# Patient Record
Sex: Female | Born: 1950 | Race: White | Hispanic: No | Marital: Married | Smoking: Never smoker
Health system: Southern US, Community
[De-identification: ages and names within clinical notes are randomized; demographics above are authoritative.]

## PROBLEM LIST (undated history)

## (undated) DIAGNOSIS — G43909 Migraine, unspecified, not intractable, without status migrainosus: Secondary | ICD-10-CM

## (undated) DIAGNOSIS — F32A Depression, unspecified: Secondary | ICD-10-CM

## (undated) DIAGNOSIS — G4700 Insomnia, unspecified: Secondary | ICD-10-CM

## (undated) DIAGNOSIS — E785 Hyperlipidemia, unspecified: Secondary | ICD-10-CM

## (undated) DIAGNOSIS — F329 Major depressive disorder, single episode, unspecified: Secondary | ICD-10-CM

## (undated) DIAGNOSIS — K219 Gastro-esophageal reflux disease without esophagitis: Secondary | ICD-10-CM

## (undated) DIAGNOSIS — M8580 Other specified disorders of bone density and structure, unspecified site: Secondary | ICD-10-CM

## (undated) DIAGNOSIS — K5900 Constipation, unspecified: Secondary | ICD-10-CM

## (undated) DIAGNOSIS — M1990 Unspecified osteoarthritis, unspecified site: Secondary | ICD-10-CM

## (undated) HISTORY — DX: Depression, unspecified: F32.A

## (undated) HISTORY — DX: Gastro-esophageal reflux disease without esophagitis: K21.9

## (undated) HISTORY — PX: ABDOMINAL HYSTERECTOMY: SHX81

## (undated) HISTORY — DX: Other specified disorders of bone density and structure, unspecified site: M85.80

## (undated) HISTORY — DX: Insomnia, unspecified: G47.00

## (undated) HISTORY — DX: Unspecified osteoarthritis, unspecified site: M19.90

## (undated) HISTORY — DX: Constipation, unspecified: K59.00

## (undated) HISTORY — PX: LYMPH NODE BIOPSY: SHX201

## (undated) HISTORY — PX: CATARACT EXTRACTION, BILATERAL: SHX1313

## (undated) HISTORY — PX: APPENDECTOMY: SHX54

## (undated) HISTORY — DX: Migraine, unspecified, not intractable, without status migrainosus: G43.909

## (undated) HISTORY — PX: CHOLECYSTECTOMY: SHX55

## (undated) HISTORY — DX: Hyperlipidemia, unspecified: E78.5

## (undated) HISTORY — PX: BREAST BIOPSY: SHX20

## (undated) HISTORY — DX: Major depressive disorder, single episode, unspecified: F32.9

---

## 1997-08-04 ENCOUNTER — Ambulatory Visit (HOSPITAL_COMMUNITY): Admission: RE | Admit: 1997-08-04 | Discharge: 1997-08-04 | Payer: Self-pay | Admitting: Family Medicine

## 1998-02-13 HISTORY — PX: BREAST EXCISIONAL BIOPSY: SUR124

## 1998-06-30 ENCOUNTER — Ambulatory Visit (HOSPITAL_BASED_OUTPATIENT_CLINIC_OR_DEPARTMENT_OTHER): Admission: RE | Admit: 1998-06-30 | Discharge: 1998-06-30 | Payer: Self-pay | Admitting: General Surgery

## 2001-09-28 ENCOUNTER — Emergency Department (HOSPITAL_COMMUNITY): Admission: EM | Admit: 2001-09-28 | Discharge: 2001-09-28 | Payer: Self-pay | Admitting: Emergency Medicine

## 2004-08-01 ENCOUNTER — Ambulatory Visit (HOSPITAL_COMMUNITY): Admission: RE | Admit: 2004-08-01 | Discharge: 2004-08-01 | Payer: Self-pay | Admitting: *Deleted

## 2006-03-18 ENCOUNTER — Inpatient Hospital Stay (HOSPITAL_COMMUNITY): Admission: EM | Admit: 2006-03-18 | Discharge: 2006-03-25 | Payer: Self-pay | Admitting: Emergency Medicine

## 2006-03-22 ENCOUNTER — Ambulatory Visit: Payer: Self-pay | Admitting: Gastroenterology

## 2006-03-23 ENCOUNTER — Encounter (INDEPENDENT_AMBULATORY_CARE_PROVIDER_SITE_OTHER): Payer: Self-pay | Admitting: *Deleted

## 2006-04-09 ENCOUNTER — Ambulatory Visit: Payer: Self-pay | Admitting: Gastroenterology

## 2006-04-09 LAB — CONVERTED CEMR LAB
ALT: 20 units/L (ref 0–40)
AST: 18 units/L (ref 0–37)
Albumin: 3.6 g/dL (ref 3.5–5.2)
Alkaline Phosphatase: 75 units/L (ref 39–117)
Amylase: 44 units/L (ref 27–131)
Basophils Absolute: 0 10*3/uL (ref 0.0–0.1)
Basophils Relative: 0.8 % (ref 0.0–1.0)
Bilirubin, Direct: 0.1 mg/dL (ref 0.0–0.3)
Eosinophils Absolute: 0.1 10*3/uL (ref 0.0–0.6)
Eosinophils Relative: 1.8 % (ref 0.0–5.0)
HCT: 31.4 % — ABNORMAL LOW (ref 36.0–46.0)
Hemoglobin: 11 g/dL — ABNORMAL LOW (ref 12.0–15.0)
Lipase: 20 units/L (ref 11.0–59.0)
Lymphocytes Relative: 27.7 % (ref 12.0–46.0)
MCHC: 34.9 g/dL (ref 30.0–36.0)
MCV: 89.5 fL (ref 78.0–100.0)
Monocytes Absolute: 0.4 10*3/uL (ref 0.2–0.7)
Monocytes Relative: 6.2 % (ref 3.0–11.0)
Neutro Abs: 3.7 10*3/uL (ref 1.4–7.7)
Neutrophils Relative %: 63.5 % (ref 43.0–77.0)
Platelets: 322 10*3/uL (ref 150–400)
RBC: 3.51 M/uL — ABNORMAL LOW (ref 3.87–5.11)
RDW: 11.4 % — ABNORMAL LOW (ref 11.5–14.6)
Total Bilirubin: 0.5 mg/dL (ref 0.3–1.2)
Total Protein: 7.4 g/dL (ref 6.0–8.3)
WBC: 5.8 10*3/uL (ref 4.5–10.5)

## 2006-04-18 ENCOUNTER — Ambulatory Visit: Payer: Self-pay | Admitting: Gastroenterology

## 2006-04-18 LAB — CONVERTED CEMR LAB
ALT: 17 units/L (ref 0–40)
AST: 14 units/L (ref 0–37)
Albumin: 3.8 g/dL (ref 3.5–5.2)
Alkaline Phosphatase: 69 units/L (ref 39–117)
Ammonia: 15 umol/L (ref 11–35)
Basophils Absolute: 0.1 10*3/uL (ref 0.0–0.1)
Basophils Relative: 1 % (ref 0.0–1.0)
Bilirubin, Direct: 0.1 mg/dL (ref 0.0–0.3)
Eosinophils Absolute: 0.1 10*3/uL (ref 0.0–0.6)
Eosinophils Relative: 1.8 % (ref 0.0–5.0)
HCT: 36.6 % (ref 36.0–46.0)
Hemoglobin: 12.2 g/dL (ref 12.0–15.0)
Lipase: 18 units/L (ref 11.0–59.0)
Lymphocytes Relative: 32.6 % (ref 12.0–46.0)
MCHC: 33.4 g/dL (ref 30.0–36.0)
MCV: 89.3 fL (ref 78.0–100.0)
Monocytes Absolute: 0.4 10*3/uL (ref 0.2–0.7)
Monocytes Relative: 6.3 % (ref 3.0–11.0)
Neutro Abs: 3.6 10*3/uL (ref 1.4–7.7)
Neutrophils Relative %: 58.3 % (ref 43.0–77.0)
Platelets: 279 10*3/uL (ref 150–400)
RBC: 4.1 M/uL (ref 3.87–5.11)
RDW: 11.6 % (ref 11.5–14.6)
Total Bilirubin: 0.8 mg/dL (ref 0.3–1.2)
Total Protein: 7.7 g/dL (ref 6.0–8.3)
WBC: 6.3 10*3/uL (ref 4.5–10.5)

## 2006-04-26 ENCOUNTER — Ambulatory Visit: Payer: Self-pay | Admitting: Gastroenterology

## 2006-05-02 ENCOUNTER — Ambulatory Visit (HOSPITAL_COMMUNITY): Admission: RE | Admit: 2006-05-02 | Discharge: 2006-05-02 | Payer: Self-pay | Admitting: Gastroenterology

## 2006-06-04 ENCOUNTER — Encounter: Admission: RE | Admit: 2006-06-04 | Discharge: 2006-06-04 | Payer: Self-pay | Admitting: Obstetrics and Gynecology

## 2007-05-22 ENCOUNTER — Encounter: Admission: RE | Admit: 2007-05-22 | Discharge: 2007-05-22 | Payer: Self-pay | Admitting: Obstetrics and Gynecology

## 2008-03-23 ENCOUNTER — Encounter: Admission: RE | Admit: 2008-03-23 | Discharge: 2008-05-26 | Payer: Self-pay | Admitting: Family Medicine

## 2008-05-25 ENCOUNTER — Encounter: Admission: RE | Admit: 2008-05-25 | Discharge: 2008-05-25 | Payer: Self-pay | Admitting: Obstetrics and Gynecology

## 2009-04-27 ENCOUNTER — Encounter: Admission: RE | Admit: 2009-04-27 | Discharge: 2009-04-27 | Payer: Self-pay | Admitting: Obstetrics and Gynecology

## 2010-07-01 ENCOUNTER — Other Ambulatory Visit: Payer: Self-pay | Admitting: Family Medicine

## 2010-07-01 DIAGNOSIS — Z1231 Encounter for screening mammogram for malignant neoplasm of breast: Secondary | ICD-10-CM

## 2010-07-01 NOTE — Assessment & Plan Note (Signed)
[facility]                         GASTROENTEROLOGY OFFICE NOTE   NAJE, RICE                        MRN:          [id number]  DATE:04/09/2006                            DOB:          19-Apr-1950    PROBLEM:  Abdominal pain.   REASON:  Mrs. Holte has returned following laparoscopic cholecystectomy.  At surgery adhesions were seen to the gallbladder.  Pathology  demonstrated chronic cholecystitis.  Intraoperative cholangiogram was  unremarkable.  Mrs. Gilchrest is now complaining of anterior chest pain that  radiates to her back.  It is moderately severe at times.  She also is  having excessive eructations.  She is having limited rectal bleeding.  She has known hemorrhoids seen by recent colonoscopy.  Pain prior to the  cholecystectomy was in the right upper quadrant.   Liver tests were initially normal.  On February 9,  AST was 501, ALT  282.  On February 10, AST was 206 and ALT was 247.  Alkaline phos and  bilirubin were normal.   EXAMINATION:  Pulse 80, blood pressure 114/62, weight 142.   IMPRESSION:  1. Abdominal and chest pain reminiscent of biliary pain.  This could      be due to esophagitis.  2. Abnormal liver tests.  Presumably this is due to irritation of the      gallbladder fossa.  There is no evidence for retained bile duct      stone.  3. Limited rectal bleeding - secondary to hemorrhoids.   RECOMMENDATIONS:  1. Begin Protonix 40 mg daily and __________ HC.  2. Follow up LFTs.     Barbette Hair. Arlyce Dice, MD,FACG  Electronically Signed    RDK/MedQ  DD: 04/09/2006  DT: 04/09/2006  Job #: [id number]   cc:   Angelia Mould. Derrell Lolling, M.D.  Ernestina Penna, M.D.

## 2010-07-01 NOTE — Assessment & Plan Note (Signed)
[name]                         GASTROENTEROLOGY OFFICE NOTE   Selena Barnes, Selena Barnes                        MRN:          [id number]  DATE:04/18/2006                            DOB:          04/06/1950    PROBLEM:  Abdominal pain.   Selena Barnes has returned still complaining of abdominal pain. The pain is  in the upper mid epigastrium and radiates through to the back between  the shoulder blades. It is chronic though tends to wax and wane. She  does not awaken with pain. It is unaffected by eating. She does note  excessive belching. Repeat LFTs on April 09, 2006 were entirely  normal. Hemoglobin was 11.0. Amylase and lipase were also unremarkable.  She remains on Protonix. She takes Celebrex intermittently but  infrequently. She is without fever, nausea or pyrosis.   PHYSICAL EXAMINATION:  VITAL SIGNS:  Pulse 78, blood pressure 118/56,  weight 138.  HEENT:  EOMI. PERRLA. Sclerae are anicteric.  Conjunctivae are pink.  NECK:  Supple without thyromegaly, adenopathy or carotid bruits.  CHEST:  Clear to auscultation and percussion without adventitious  sounds.  CARDIAC:  Regular rhythm; normal S1 S2.  There are no murmurs, gallops  or rubs.  ABDOMEN:  She has mild tenderness in the subxiphoid area. There are no  abdominal masses or organomegaly.  EXTREMITIES:  Full range of motion.  No cyanosis, clubbing or edema.  RECTAL:  There are no masses.  Stool is Hemoccult negative.   IMPRESSION:  Persistent upper abdominal pain with a pattern reminiscent  of biliary tract pain. Biliary dyskinesia is a possibility. Retained  bile duct stone is unlikely in the face of a normal intraoperative  cholangiogram. She does not appear sick enough to have a biliary leak.  Finally, esophageal pain can sometimes present in a similar manner.   RECOMMENDATION:  1. Repeat LFTs, CBC and amylase.  2. Trial of Levbid 0.375 mg twice a day.  3. Hepatobiliary scan.     Barbette Hair. Arlyce Dice, MD,FACG  Electronically Signed    RDK/MedQ  DD: 04/18/2006  DT: 04/18/2006  Job #: [id number]   cc:   Angelia Mould. Derrell Lolling, M.D.  Ernestina Penna, M.D.

## 2010-07-01 NOTE — Consult Note (Signed)
Selena Barnes, Selena Barnes                 ACCOUNT NO.:  [id number]   MEDICAL RECORD NO.:  [id number]          PATIENT TYPE:  INP   LOCATION:  1618                         FACILITY:  [facility]   PHYSICIAN:  Selena Barnes, M.D.DATE OF BIRTH:  05/06/1950   DATE OF CONSULTATION:  03/23/2006  DATE OF DISCHARGE:                                 CONSULTATION   REASON FOR CONSULTATION:  Evaluate abdominal pain and possible biliary  tract source.   HISTORY OF PRESENT ILLNESS:  This is a 60 year old white female who  reports intermittent bright red rectal bleeding since February 09, 2007.  She has had problems like this off and on but has never had any rectal  surgery. She also has a second complaint which is epigastric right upper  quadrant and back pain with intermittent nausea and vomiting and  intermittent pain since March 15, 2006.  She was admitted to this  hospital on March 18, 2006 complaining of rectal bleeding and the  abdominal pain. She has been found to have a urinary tract infection and  is on Cipro for that.   LABORATORY DATA:  Her lab work has been fairly unremarkable with normal  hemoglobin, normal white blood cell count, normal coagulation status,  slightly elevated lipase but which promptly normalized, and normal liver  function tests. She has had normal cardiac enzymes.   She has had an extensive work-up under the direction of Dr. Melvia Barnes. She has had a CT scan of the abdomen which shows no acute  findings in the abdomen, and some mild soft tissue prominence in the  region of the ileocecal valve.  She has had a gallbladder ultrasound  which shows a small cyst in the left hepatic lobe. The gallbladder looks  normal, no stones, no wall thickening, no fluid and normal common bile  duct. Spleen and kidneys were normal.  She has had an MRCP which shows  no acute findings, no evidence of gallbladder, biliary or pancreatic  disease. She has had an upper endoscopy which was  unremarkable. She has  had a colonoscopy which shows hemorrhoids, there was no problem at the  ileocecal valve. She had a hepatobiliary scan this morning which shows  that the biliary tree empties and the gallbladder fills but there was a  zero percent ejection fraction after a fatty meal.  The radiologist felt  that this was consistent with biliary dyskinesia and Dr. Arlyce Barnes asked me  to see the patient, thinking that she needs a laparoscopic  cholecystectomy.   The patient currently is in no distress but states that she still has  some upper abdominal discomfort.   PAST MEDICAL HISTORY:  1. Migraine headaches.  2. Hemorrhoids.  3. Surgical menopause.  4. Status post total abdominal hysterectomy in 1976 secondary to      pelvic infection. She states she had an appendectomy at the same      time by Dr. Javier Barnes.   CURRENT MEDICATIONS:  1. Femtrace 1.8 mg daily.  2. Cymbalta 60 mg daily.  3. Topamax 100 mg at h.s.  4. Maxalt-MLT 10  mg q.2 hours x2 p.r.n. severe migraines.  5. Flexeril p.r.n.  6. Ambien 15 mg at h.s.   ALLERGIES:  No known drug allergies.   SOCIAL HISTORY:  She is married. Non smoker. Occasional alcohol.   FAMILY HISTORY:  Coronary artery disease in the father and maternal  uncle. No history of hypertension. Diabetes in a grandson and her  maternal family. No history of cancer that she knows of.   REVIEW OF SYSTEMS:  A 15 system review of systems is performed and  noncontributory except as described above.   PHYSICAL EXAMINATION:  GENERAL: A pleasant white female in no distress.  VITAL SIGNS: Temperature 97.5, blood pressure 128/64, pulse 67,  respiratory rate 20, oxygen saturation 98% on room air.  HEENT: Eyes - sclerae clear. Extraocular movements are intact. Ears,  nose, mouth, throat, lips, tongue and oropharynx are without gross  lesions.  NECK: Supple, nontender, no adenopathy, no mass.  LUNGS: Clear to auscultation, no chest wall tenderness.  HEART:   Regular rate and rhythm, no murmur. Radial and femoral pulses  are palpable.  ABDOMEN: Soft. Subjectively mild tenderness in the upper abdomen, really  no guarding, no mass, no distention. Liver and spleen not enlarged.  No hernia.  GENITOURINARY: No inguinal adenopathy or hernia.  EXTREMITIES: Moves all four extremities well without pain or deformity.  NEUROLOGIC: No gross motor or sensory deficits.   DATA REVIEWED:  I reviewed all of her lab work, all of her endoscopy  reports and all of her x-ray findings.   ASSESSMENT:  1. Upper abdominal pain. Her pain syndrome is consistent with biliary      colic and her hepatobiliary scan strongly suggests biliary      dyskinesia.  2. Extensive gastroenterology work-up which excludes most other major      problems. I suspect that her symptoms are due to biliary dyskinesia      with about a 75 to 80% confidence level.  She was told this.   PLAN:  I offered her the option of proceeding with a laparoscopic  cholecystectomy both as an empiric procedure and hopefully this would be  therapeutic as well.   I have discussed the indication and details of surgery with her. Risks  and complications have been outlined including but not limited to,  bleeding, infection, conversion to open laparotomy, injury to adjacent  organs such as  the intestine or bile duct with major reconstructive surgery, cardiac,  pulmonary and thromboembolic problems. She seems to understand these  issues well. At this time all of her questions were answered.  She is in  full agreement with this plan.  We will proceed with surgery at our next  convenience.      Selena Barnes, M.D.  Electronically Signed     HMI/MEDQ  D:  03/23/2006  T:  03/23/2006  Job:  [id number]   cc:   Barbette Hair. Selena Dice, MD,FACG  [address]  [place]  [place] [address]   Della Goo, M.D.  Fax: [phone]

## 2010-07-01 NOTE — Op Note (Signed)
NAMEJULIAUNA, Selena Barnes                 ACCOUNT NO.:  [id number]   MEDICAL RECORD NO.:  [id number]          PATIENT TYPE:  INP   LOCATION:  1618                         FACILITY:  [facility]   PHYSICIAN:  Angelia Mould. Derrell Lolling, M.D.DATE OF BIRTH:  Dec 22, 1950   DATE OF PROCEDURE:  03/23/2006  DATE OF DISCHARGE:                               OPERATIVE REPORT   PREOPERATIVE DIAGNOSIS:  Chronic cholecystitis and biliary dyskinesia.   POSTOPERATIVE DIAGNOSIS:  Chronic cholecystitis and biliary dyskinesia.   OPERATION PERFORMED:  Laparoscopic cholecystectomy with intraoperative  cholangiogram.   SURGEON:  Dr. Claud Kelp.   FIRST ASSISTANT:  Dr. Leonie Man.   OPERATIVE INDICATIONS:  This is a 60 year old white female who was  admitted to this hospital on March 18, 2006 with rectal bleeding  secondary to hemorrhoids and upper abdominal pain.  The hemorrhoidal  bleeding had been going off and on for about 5 weeks but the abdominal  pain had been going on just for a day or two since about March 15, 2006.  She has had some nausea and vomiting.  This was somewhat  postprandial but not clearly.  She was admitted. She has had an  extensive GI workup including a CT scan, MRCP, upper endoscopy,  colonoscopy with banding of internal hemorrhoids, gallbladder  ultrasound, and hepatobiliary scan.  The only significant findings are  of the hemorrhoids and on the hepatobiliary scan she has a 0% ejection  fraction after a fatty meal suggesting biliary dyskinesia.  Her lab work  is unremarkable.  I was asked to see her this morning.  I advised her  that I thought that she probably had chronic cholecystitis and that she  probably would respond to cholecystectomy, although it was not clear.  Given that there was very little else to do diagnostically, we decided  to go ahead with the cholecystectomy today.   OPERATIVE FINDINGS:  The gallbladder was chronically inflamed in  appearance, there were  numerous adhesions to it.  Photographs were  taken.  There were lots of adhesions in the lower abdomen, presumably  from her previous hysterectomy and appendectomy.  There was no hernia.  The liver, stomach, duodenum, large intestine and small intestine were  grossly normal to inspection.   OPERATIVE TECHNIQUE:  Following the induction of general endotracheal  anesthesia, the patient's abdomen was prepped and draped in a sterile  fashion.  Intravenous antibiotics were given.  The patient was  identified as to correct patient and procedure.  0.5% Marcaine with  epinephrine was used as a local infiltration anesthetic.  A vertically  oriented incision was made inside the lower rim of the umbilicus.  The  fascia was incised in the midline, the abdominal cavity entered under  direct vision.  A 10 mm Hassan trocar was inserted and secured with a  pursestring suture of #0 Vicryl.  Pneumoperitoneum was created.  The  video camera was inserted.  I had to pass the video camera off to the  left upper quadrant and then pass it back to the right to get around  some adhesions. I could  then see the liver and the gallbladder and the  right upper quadrant quite nicely.  We placed a 10-mm trocar in  subxiphoid region and two 5 mm trocars in the right upper quadrant.  We  lifted up the fundus of the gallbladder.  We took down lots of adhesions  which were chronic.  We identified the infundibulum of the gallbladder  and retracted it laterally.  We dissected out the cystic duct and the  cystic artery.  The cystic duct was secured with a metal clip close to  the gallbladder.  A cholangiogram catheter was inserted into the cystic  duct.  A cholangiogram was obtained using the C-arm.  The cholangiogram  showed normal intra and extrahepatic bile ducts, no filling defects, no  biliary dilatation, and no obstruction with good flow of contrast into  the duodenum.  The cholangiogram catheter was removed, the cystic  duct  was secured with multiple metal clips and divided.  The cystic artery  was then isolated, secured with multiple metal clips and divided.  The  gallbladder was dissected from its bed using electrocautery and removed  through the umbilical port.  The operative field was irrigated and  inspected.  There was no bleeding and no bile leak whatsoever, and the  irrigation fluid was completely clear.  The trocars were removed under  direct vision.  There was no bleeding from the trocar sites. The  pneumoperitoneum was released.  The fascia at the umbilicus was closed  with #0 Vicryl sutures.  The skin incisions were closed with  subcuticular sutures of 4-0 Monocryl and Steri-Strips.  Clean bandages  were placed and the patient taken to the recovery room in stable  condition.  Estimated blood loss was about 10 mL.  Complications none.  Sponge, needle and instrument counts were correct.      Angelia Mould. Derrell Lolling, M.D.  Electronically Signed     HMI/MEDQ  D:  03/23/2006  T:  03/23/2006  Job:  [id number]   cc:   Della Goo, M.D.  Fax: [phone]   [facility] D. Arlyce Dice, MD,FACG  [address]  [place]  [place] [address]

## 2010-07-01 NOTE — Assessment & Plan Note (Signed)
[facility]                         GASTROENTEROLOGY OFFICE NOTE   Selena Barnes, Selena Barnes                        MRN:          [id number]  DATE:04/26/2006                            DOB:          08/10/1950    PROBLEM:  Abdominal pain.   Selena Barnes has returned for scheduled follow-up.  She continues to  complain of pain in her subxiphoid area that radiates to her back.  It  is not clearly related to meals.  She does note that it worsens with  prolonged sitting or lying down.   Repeat labs including CBC, LFTs, amylase and lipase were normal.  She  has been taking Levbid twice a day without improvement.  Hepatobiliary  scan was requested but was not done.   PHYSICAL EXAMINATION:  VITAL SIGNS:  Pulse 84, blood pressure 116/78,  weight 137.  ABDOMEN:  She has minimal tenderness over her sternum and subxiphoid  area.  There is no guarding or rebound nor any abdominal masses or  organomegaly.   IMPRESSION:  Persistent upper abdominal pain.  Biliary dyskinesia is a  consideration, though there is no supporting evidence thus far.  I think  it is unlikely that this is esophageal pain.  Musculoskeletal pain is a  possibility.   RECOMMENDATIONS:  Hepatobiliary scan.  If this shows a delay in emptying  into the duodenum, I would consider ERCP with sphincterotomy.  If this  is negative, I will refer her for biliary manometry.     Barbette Hair. Arlyce Dice, MD,FACG  Electronically Signed    RDK/MedQ  DD: 04/26/2006  DT: 04/28/2006  Job #: [id number]   cc:   Angelia Mould. Derrell Lolling, M.D.  Ernestina Penna, M.D.

## 2010-07-01 NOTE — H&P (Signed)
Selena Barnes, Selena Barnes                 ACCOUNT NO.:  [id number]   MEDICAL RECORD NO.:  [id number]          PATIENT TYPE:  INP   LOCATION:  0104                         FACILITY:  [facility]   PHYSICIAN:  Della Goo, M.D. DATE OF BIRTH:  10-04-1950   DATE OF ADMISSION:  03/17/2006  DATE OF DISCHARGE:                              HISTORY & PHYSICAL   CONTINUATION:   ASSESSMENT:  This is a 60 year old female presenting with abdominal pain  and bright red blood per rectum.   ADMITTING DIAGNOSES INCLUDE:  1. Lower gastrointestinal bleeding.  2. Epigastric abdominal pain.  3. Gastroesophageal reflux disease.  4. Hypokalemia.  5. UTI  6. External hemorrhoids.  7. History of migraine headaches.  8. Surgical menopause.   PLAN:  The patient will be admitted to the med-surgery area as mentioned  previously, she will be on a clear liquid diet and IV fluids.  She will  be medicated with IV Protonix q.12 h. which may help alleviate the  heartburn and gastroesophageal reflux disease symptoms.  Gastroenterology will be consulted.  Anusol-HC suppositories have been  ordered secondary to the external hemorrhoids.  An abdominal CT has been  ordered to evaluate the left lobe of the liver enhancement on CT scan  performed on January31, 2008 along with the evidence of fatty liver that  was also found.  The patient will continue on her regular medications  otherwise.  IV cipro has been statrrted for the UTI.      Della Goo, M.D.  Electronically Signed     HJ/MEDQ  D:  03/18/2006  T:  03/18/2006  Job:  [id number]

## 2010-07-01 NOTE — Discharge Summary (Signed)
Selena Barnes, Selena Barnes                 ACCOUNT NO.:  [id number]   MEDICAL RECORD NO.:  [id number]          PATIENT TYPE:  INP   LOCATION:  1618                         FACILITY:  [facility]   PHYSICIAN:  Michaelyn Barter, M.D. DATE OF BIRTH:  [birth year]   DATE OF ADMISSION:  03/17/2006  DATE OF DISCHARGE:  03/25/2006                               DISCHARGE SUMMARY   PRIMARY CARE PHYSICIAN:  [facility].   FINAL DIAGNOSES:  1. Rectal bleed secondary to internal hemorrhoids.  2. Persistent abdominal pain.  3. Biliary dyskinesia.  4. Chronic cholecystitis.  5. Hypotension.  6. Urinary tract infection.  7. Transaminitis.  8. Hypoalbuminemia.   PROCEDURES.:  1. Abdominal x-ray completed March 18, 2006.  2. CT scan of the abdomen and pelvis done on March 18, 2006.  3. Ultrasound of the abdomen done on March 18, 2006.  4. MRCP completed March 21, 2006.  5. Nuclear medicine hepatobiliary scan completed on March 23, 2006.  6. EGD completed on March 20, 2006.  7. Colonoscopy completed March 20, 2006.  8. Band ligation of hemorrhoids.  9. Laparoscopic cholecystectomy with intraoperative cholangiogram done      by Dr. Claud Kelp on March 23, 2006.   HISTORY OF PRESENT ILLNESS:  Ms. Clippard is a 60 year old female who  arrived to the emergency department complaining of bright red blood  coming from her rectum.  She indicated that this had been occurring for  approximately 5 weeks. She stated that she had episodes on the evening  prior to coming to the hospital.   For Past Medical History, please see that dictated by Dr. Della Goo.   HOSPITAL COURSE:  #1.  HEMATOCHEZIA:  This was believed to be secondary to internal  hemorrhoids.  Gastroenterology was consulted, and on February 5, Dr.  Melvia Heaps performed an EGD. His assessment was that it was a normal  examination. On that same date, Dr. Arlyce Dice performed a colonoscopy. He  indicated that  the patient had internal hemorrhoids present. He  recommended Anusol suppository at that time.  Since that time, the  patient has undergone banding of her hemorrhoids.   #2.  PERSISTENT ABDOMINAL PAIN SECONDARY TO CHRONIC CHOLECYSTITIS AND  BILIARY DYSKINESIA:  An abdominal x-ray was completed on March 18, 2006. It revealed no evidence for bowel perforation or obstruction.  The  patient underwent a CAT scan of her abdomen and pelvis on March 18, 2006.  These revealed no acute finding in the abdomen. Mild soft tissue  prominence was noted in the region of the ileocecal valve, was found to  be nonspecific. An ultrasound of her abdomen was also completed February  3 which revealed a 1 cm left hepatic cyst and a normal gallbladder. The  patient's abdominal symptoms persisted and she finally underwent a MRCP  on March 21, 2006.  The MRCP revealed no acute abdominal findings.  There was noted to be no evidence of gallbladder or biliary disease.  The pancreas and pancreatic duct were normal. On February 8, the patient  had an hepatobiliary scan completed.  It revealed cystic duct to be  patent, and the gallbladder was visualized. The gallbladder did not  empty appreciably with oral half-and-half. General surgery was  consulted. Dr. Derrell Lolling took the patient to the operating room on February  8 and performed a laparoscopic cholecystectomy with intraoperative  cholangiogram. This was secondary to a diagnosis of chronic  cholecystitis and biliary dyskinesia. The patient's operation appears to  have gone without any complications. She is had some residual abdominal  discomfort but states that the pain medication helps to alleviate her  symptoms.   #3.  HYPOTENSION:  The patient did have some mild hypotension following  her surgery.  However, IV fluid hydration has helped to alleviate this.  She was otherwise asymptomatic secondary to this.   #4.  URINARY TRACT INFECTION:  The patient did  have a urinalysis  completed which confirmed the presence of a UTI. Ciprofloxacin was  subsequently started. The patient remained afebrile with a normal white  count   #5.  TRANSAMINITIS: The patient's liver enzymes were noted to be  significantly elevated, particularly during the latter portion of her  hospitalization. The patient's SGOT was noted to be 18 on February 3. By  February 9,  it went up to 501.  On February 3, the patient's SGPT was  noted to be 16.  By February 9, it increased to 282. The plan was  initially to discharge the patient from the hospital on February 9;  however, given this elevation in the patient's liver enzymes, she was  held for one additional day to ensure that there was a decline in the  patient's liver enzymes, and on February 10, there was noted to be a  decline in the patient's liver enzymes.  Her SGOT, which was 501 the  previous day, declined to 206.  The SGPT, which was 282, declined to  147.   #6.  HYPOALBUMINEMIA.  The patient's albumin level was noted to be 2.9  by March 24, 2006.  There may have been a component of mild protein  calorie malnutrition. This was monitored during the course of the  hospitalization.  Hopefully once the patient is over her current  situation and begins to recuperate from her surgery, her p.o. intake  will resume, and this may help to improve her albumin. On the day of  discharge, the patient indicated that she wanted to go home.  She had no  new complaints.   Vital Signs: Her temperature was 97.9, heart rate 81, respirations 20,  blood pressure 125/64, O2 saturation 97% on room air.  The patient's  labs on that date of discharge: Her white blood cell count was 6.3,  hemoglobin 11.2, hematocrit 32.1, platelets 215. Her sodium was 143,  potassium 4.0, chloride 110, CO2 30, glucose 103, BUN 5, creatinine  0.71, alkaline phosphatase 104, SGOT 206, SGPT 247, total protein 5.5, albumin 2.9, calcium 9.0. Amylase 301,  lipase 23. The decision was made  to discharge the patient from the hospital. She was sent home on:  1. Ciprofloxacin 500 mg p.o. q.12 h.  2. Cymbalta 60 mg p.o. daily.  3. Protonix 40 mg p.o. daily.  4. Topamax 1 tablet at bedtime.  5. Zofran 4 mg every 8 hours.   She was told to take all her medications as directed. She is instructed  to call Dr. Arlyce Dice for followup appointment and to see surgery's  instructions.  Likewise, she was given instructions to call general  surgery for followup appointment.  The patient did undergo banding of  her internal hemorrhoids on March 22, 2006.      Michaelyn Barter, M.D.  Electronically Signed     OR/MEDQ  D:  03/25/2006  T:  03/25/2006  Job:  [id number]
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
## 2010-07-08 ENCOUNTER — Ambulatory Visit: Payer: Self-pay

## 2010-07-13 ENCOUNTER — Ambulatory Visit
Admission: RE | Admit: 2010-07-13 | Discharge: 2010-07-13 | Disposition: A | Discharge status: Home / Self Care | Payer: 59 | Source: Ambulatory Visit | Attending: Family Medicine | Admitting: Family Medicine

## 2010-07-13 DIAGNOSIS — Z1231 Encounter for screening mammogram for malignant neoplasm of breast: Secondary | ICD-10-CM

## 2011-03-30 DIAGNOSIS — R072 Precordial pain: Secondary | ICD-10-CM | POA: Diagnosis not present

## 2011-03-30 DIAGNOSIS — R079 Chest pain, unspecified: Secondary | ICD-10-CM | POA: Diagnosis not present

## 2011-03-30 DIAGNOSIS — K219 Gastro-esophageal reflux disease without esophagitis: Secondary | ICD-10-CM | POA: Diagnosis not present

## 2011-05-02 DIAGNOSIS — E559 Vitamin D deficiency, unspecified: Secondary | ICD-10-CM | POA: Diagnosis not present

## 2011-05-02 DIAGNOSIS — K219 Gastro-esophageal reflux disease without esophagitis: Secondary | ICD-10-CM | POA: Diagnosis not present

## 2011-05-02 DIAGNOSIS — G4700 Insomnia, unspecified: Secondary | ICD-10-CM | POA: Diagnosis not present

## 2011-05-02 DIAGNOSIS — E785 Hyperlipidemia, unspecified: Secondary | ICD-10-CM | POA: Diagnosis not present

## 2011-05-02 DIAGNOSIS — I10 Essential (primary) hypertension: Secondary | ICD-10-CM | POA: Diagnosis not present

## 2011-06-09 ENCOUNTER — Other Ambulatory Visit: Payer: Self-pay | Admitting: Family Medicine

## 2011-06-09 DIAGNOSIS — Z1231 Encounter for screening mammogram for malignant neoplasm of breast: Secondary | ICD-10-CM

## 2011-07-17 ENCOUNTER — Ambulatory Visit
Admission: RE | Admit: 2011-07-17 | Discharge: 2011-07-17 | Disposition: A | Discharge status: Home / Self Care | Payer: 59 | Source: Ambulatory Visit | Attending: Family Medicine | Admitting: Family Medicine

## 2011-07-17 DIAGNOSIS — Z1231 Encounter for screening mammogram for malignant neoplasm of breast: Secondary | ICD-10-CM

## 2011-08-03 DIAGNOSIS — R5381 Other malaise: Secondary | ICD-10-CM | POA: Diagnosis not present

## 2011-08-03 DIAGNOSIS — K219 Gastro-esophageal reflux disease without esophagitis: Secondary | ICD-10-CM | POA: Diagnosis not present

## 2011-08-03 DIAGNOSIS — E785 Hyperlipidemia, unspecified: Secondary | ICD-10-CM | POA: Diagnosis not present

## 2011-11-09 DIAGNOSIS — G4700 Insomnia, unspecified: Secondary | ICD-10-CM | POA: Diagnosis not present

## 2011-11-09 DIAGNOSIS — I10 Essential (primary) hypertension: Secondary | ICD-10-CM | POA: Diagnosis not present

## 2011-11-09 DIAGNOSIS — Z23 Encounter for immunization: Secondary | ICD-10-CM | POA: Diagnosis not present

## 2011-11-09 DIAGNOSIS — E785 Hyperlipidemia, unspecified: Secondary | ICD-10-CM | POA: Diagnosis not present

## 2011-11-09 DIAGNOSIS — G43919 Migraine, unspecified, intractable, without status migrainosus: Secondary | ICD-10-CM | POA: Diagnosis not present

## 2012-02-20 DIAGNOSIS — D3130 Benign neoplasm of unspecified choroid: Secondary | ICD-10-CM | POA: Diagnosis not present

## 2012-03-12 DIAGNOSIS — G4700 Insomnia, unspecified: Secondary | ICD-10-CM | POA: Diagnosis not present

## 2012-03-12 DIAGNOSIS — K219 Gastro-esophageal reflux disease without esophagitis: Secondary | ICD-10-CM | POA: Diagnosis not present

## 2012-03-12 DIAGNOSIS — I10 Essential (primary) hypertension: Secondary | ICD-10-CM | POA: Diagnosis not present

## 2012-03-12 DIAGNOSIS — G43919 Migraine, unspecified, intractable, without status migrainosus: Secondary | ICD-10-CM | POA: Diagnosis not present

## 2012-03-12 DIAGNOSIS — I739 Peripheral vascular disease, unspecified: Secondary | ICD-10-CM | POA: Diagnosis not present

## 2012-03-12 DIAGNOSIS — E785 Hyperlipidemia, unspecified: Secondary | ICD-10-CM | POA: Diagnosis not present

## 2012-06-11 ENCOUNTER — Other Ambulatory Visit: Payer: Self-pay

## 2012-06-11 DIAGNOSIS — Z1231 Encounter for screening mammogram for malignant neoplasm of breast: Secondary | ICD-10-CM

## 2012-07-17 ENCOUNTER — Ambulatory Visit
Admission: RE | Admit: 2012-07-17 | Discharge: 2012-07-17 | Disposition: A | Discharge status: Home / Self Care | Payer: Medicare Other | Source: Ambulatory Visit

## 2012-07-17 DIAGNOSIS — Z1231 Encounter for screening mammogram for malignant neoplasm of breast: Secondary | ICD-10-CM

## 2012-11-28 ENCOUNTER — Other Ambulatory Visit: Payer: Self-pay | Admitting: *Deleted

## 2012-11-28 MED ORDER — ZOLPIDEM TARTRATE 10 MG PO TABS: 10.0000 mg | ORAL_TABLET | Freq: Every evening | ORAL | Status: DC | PRN

## 2012-11-28 NOTE — Telephone Encounter (Signed)
Please refill ambien rx

## 2012-11-28 NOTE — Telephone Encounter (Signed)
Has appt 12/18/12, last filled 07/18/12. If approved, route to pool B so it can be called into [facility]

## 2012-11-29 NOTE — Telephone Encounter (Signed)
Called into [facility]

## 2012-12-20 ENCOUNTER — Encounter (INDEPENDENT_AMBULATORY_CARE_PROVIDER_SITE_OTHER): Payer: Self-pay

## 2012-12-20 ENCOUNTER — Ambulatory Visit (INDEPENDENT_AMBULATORY_CARE_PROVIDER_SITE_OTHER): Payer: Medicare Other | Admitting: Nurse Practitioner

## 2012-12-20 ENCOUNTER — Encounter: Payer: Self-pay | Admitting: Nurse Practitioner

## 2012-12-20 VITALS — BP 123/62 | HR 62 | Temp 97.1°F | Ht 64.0 in | Wt 152.0 lb

## 2012-12-20 DIAGNOSIS — G43909 Migraine, unspecified, not intractable, without status migrainosus: Secondary | ICD-10-CM

## 2012-12-20 DIAGNOSIS — F329 Major depressive disorder, single episode, unspecified: Secondary | ICD-10-CM | POA: Diagnosis not present

## 2012-12-20 DIAGNOSIS — Z124 Encounter for screening for malignant neoplasm of cervix: Secondary | ICD-10-CM

## 2012-12-20 DIAGNOSIS — Z23 Encounter for immunization: Secondary | ICD-10-CM | POA: Diagnosis not present

## 2012-12-20 DIAGNOSIS — Z0000 Encounter for general adult medical examination without abnormal findings: Secondary | ICD-10-CM | POA: Diagnosis not present

## 2012-12-20 DIAGNOSIS — E785 Hyperlipidemia, unspecified: Secondary | ICD-10-CM | POA: Diagnosis not present

## 2012-12-20 DIAGNOSIS — Z01419 Encounter for gynecological examination (general) (routine) without abnormal findings: Secondary | ICD-10-CM

## 2012-12-20 DIAGNOSIS — F32A Depression, unspecified: Secondary | ICD-10-CM

## 2012-12-20 DIAGNOSIS — G4700 Insomnia, unspecified: Secondary | ICD-10-CM | POA: Diagnosis not present

## 2012-12-20 DIAGNOSIS — K219 Gastro-esophageal reflux disease without esophagitis: Secondary | ICD-10-CM

## 2012-12-20 LAB — POCT URINALYSIS DIPSTICK
Bilirubin, UA: NEGATIVE
Glucose, UA: NEGATIVE
Ketones, UA: NEGATIVE
Leukocytes, UA: NEGATIVE
Nitrite, UA: NEGATIVE
Spec Grav, UA: 1.01
Urobilinogen, UA: NEGATIVE
pH, UA: 8

## 2012-12-20 LAB — POCT CBC
Granulocyte percent: 51.5 %G (ref 37–80)
HCT, POC: 41.7 % (ref 37.7–47.9)
Hemoglobin: 13.9 g/dL (ref 12.2–16.2)
Lymph, poc: 3.3 (ref 0.6–3.4)
MCH, POC: 28.9 pg (ref 27–31.2)
MCHC: 33.4 g/dL (ref 31.8–35.4)
MCV: 86.6 fL (ref 80–97)
MPV: 8.1 fL (ref 0–99.8)
POC Granulocyte: 4.2 (ref 2–6.9)
POC LYMPH PERCENT: 40.8 %L (ref 10–50)
Platelet Count, POC: 292 10*3/uL (ref 142–424)
RBC: 4.8 M/uL (ref 4.04–5.48)
RDW, POC: 12.2 %
WBC: 8.2 10*3/uL (ref 4.6–10.2)

## 2012-12-20 MED ORDER — ESTRADIOL 1 MG PO TABS: 1.0000 mg | ORAL_TABLET | Freq: Every day | ORAL | Status: DC

## 2012-12-20 MED ORDER — PROMETHAZINE HCL 25 MG PO TABS: 25.0000 mg | ORAL_TABLET | Freq: Four times a day (QID) | ORAL | Status: DC | PRN

## 2012-12-20 MED ORDER — DULOXETINE HCL 60 MG PO CPEP: 60.0000 mg | ORAL_CAPSULE | Freq: Every day | ORAL | Status: DC

## 2012-12-20 MED ORDER — RIZATRIPTAN BENZOATE 10 MG PO TBDP: 10.0000 mg | ORAL_TABLET | ORAL | Status: DC | PRN

## 2012-12-20 MED ORDER — METAXALONE 800 MG PO TABS: 800.0000 mg | ORAL_TABLET | Freq: Three times a day (TID) | ORAL | Status: DC

## 2012-12-20 MED ORDER — ESOMEPRAZOLE MAGNESIUM 40 MG PO CPDR: 40.0000 mg | DELAYED_RELEASE_CAPSULE | Freq: Every day | ORAL | Status: DC

## 2012-12-20 MED ORDER — ZOLPIDEM TARTRATE 10 MG PO TABS: 10.0000 mg | ORAL_TABLET | Freq: Every evening | ORAL | Status: DC | PRN

## 2012-12-20 MED ORDER — PRAVASTATIN SODIUM 40 MG PO TABS: 40.0000 mg | ORAL_TABLET | Freq: Every day | ORAL | Status: DC

## 2012-12-20 MED ORDER — DICLOFENAC POTASSIUM 50 MG PO TABS: 50.0000 mg | ORAL_TABLET | Freq: Three times a day (TID) | ORAL | Status: DC

## 2012-12-20 MED ORDER — TOPIRAMATE 100 MG PO TABS: 100.0000 mg | ORAL_TABLET | Freq: Every day | ORAL | Status: DC

## 2012-12-20 NOTE — Addendum Note (Signed)
Addended by: Prescott Gum on: 12/20/2012 01:56 PM   Modules accepted: Orders

## 2012-12-20 NOTE — Addendum Note (Signed)
Addended by: Prescott Gum on: 12/20/2012 12:58 PM   Modules accepted: Orders

## 2012-12-20 NOTE — Patient Instructions (Signed)
Fat and Cholesterol Control Diet  Fat and cholesterol levels in your blood and organs are influenced by your diet. High levels of fat and cholesterol may lead to diseases of the heart, small and large blood vessels, gallbladder, liver, and pancreas.  CONTROLLING FAT AND CHOLESTEROL WITH DIET  Although exercise and lifestyle factors are important, your diet is key. That is because certain foods are known to raise cholesterol and others to lower it. The goal is to balance foods for their effect on cholesterol and more importantly, to replace saturated and trans fat with other types of fat, such as monounsaturated fat, polyunsaturated fat, and omega-3 fatty acids.  On average, a person should consume no more than 15 to 17 g of saturated fat daily. Saturated and trans fats are considered "bad" fats, and they will raise LDL cholesterol. Saturated fats are primarily found in animal products such as meats, butter, and cream. However, that does not mean you need to give up all your favorite foods. Today, there are good tasting, low-fat, low-cholesterol substitutes for most of the things you like to eat. Choose low-fat or nonfat alternatives. Choose round or loin cuts of red meat. These types of cuts are lowest in fat and cholesterol. Chicken (without the skin), fish, veal, and ground turkey breast are great choices. Eliminate fatty meats, such as hot dogs and salami. Even shellfish have little or no saturated fat. Have a 3 oz (85 g) portion when you eat lean meat, poultry, or fish.  Trans fats are also called "partially hydrogenated oils." They are oils that have been scientifically manipulated so that they are solid at room temperature resulting in a longer shelf life and improved taste and texture of foods in which they are added. Trans fats are found in stick margarine, some tub margarines, cookies, crackers, and baked goods.   When baking and cooking, oils are a great substitute for butter. The monounsaturated oils are  especially beneficial since it is believed they lower LDL and raise HDL. The oils you should avoid entirely are saturated tropical oils, such as coconut and palm.   Remember to eat a lot from food groups that are naturally free of saturated and trans fat, including fish, fruit, vegetables, beans, grains (barley, rice, couscous, bulgur wheat), and pasta (without cream sauces).   IDENTIFYING FOODS THAT LOWER FAT AND CHOLESTEROL   Soluble fiber may lower your cholesterol. This type of fiber is found in fruits such as apples, vegetables such as broccoli, potatoes, and carrots, legumes such as beans, peas, and lentils, and grains such as barley. Foods fortified with plant sterols (phytosterol) may also lower cholesterol. You should eat at least 2 g per day of these foods for a cholesterol lowering effect.   Read package labels to identify low-saturated fats, trans fat free, and low-fat foods at the supermarket. Select cheeses that have only 2 to 3 g saturated fat per ounce. Use a heart-healthy tub margarine that is free of trans fats or partially hydrogenated oil. When buying baked goods (cookies, crackers), avoid partially hydrogenated oils. Breads and muffins should be made from whole grains (whole-wheat or whole oat flour, instead of "flour" or "enriched flour"). Buy non-creamy canned soups with reduced salt and no added fats.   FOOD PREPARATION TECHNIQUES   Never deep-fry. If you must fry, either stir-fry, which uses very little fat, or use non-stick cooking sprays. When possible, broil, bake, or roast meats, and steam vegetables. Instead of putting butter or margarine on vegetables, use lemon   and herbs, applesauce, and cinnamon (for squash and sweet potatoes). Use nonfat yogurt, salsa, and low-fat dressings for salads.   LOW-SATURATED FAT / LOW-FAT FOOD SUBSTITUTES  Meats / Saturated Fat (g)  · Avoid: Steak, marbled (3 oz/85 g) / 11 g  · Choose: Steak, lean (3 oz/85 g) / 4 g  · Avoid: Hamburger (3 oz/85 g) / 7  g  · Choose: Hamburger, lean (3 oz/85 g) / 5 g  · Avoid: Ham (3 oz/85 g) / 6 g  · Choose: Ham, lean cut (3 oz/85 g) / 2.4 g  · Avoid: Chicken, with skin, dark meat (3 oz/85 g) / 4 g  · Choose: Chicken, skin removed, dark meat (3 oz/85 g) / 2 g  · Avoid: Chicken, with skin, light meat (3 oz/85 g) / 2.5 g  · Choose: Chicken, skin removed, light meat (3 oz/85 g) / 1 g  Dairy / Saturated Fat (g)  · Avoid: Whole milk (1 cup) / 5 g  · Choose: Low-fat milk, 2% (1 cup) / 3 g  · Choose: Low-fat milk, 1% (1 cup) / 1.5 g  · Choose: Skim milk (1 cup) / 0.3 g  · Avoid: Hard cheese (1 oz/28 g) / 6 g  · Choose: Skim milk cheese (1 oz/28 g) / 2 to 3 g  · Avoid: Cottage cheese, 4% fat (1 cup) / 6.5 g  · Choose: Low-fat cottage cheese, 1% fat (1 cup) / 1.5 g  · Avoid: Ice cream (1 cup) / 9 g  · Choose: Sherbet (1 cup) / 2.5 g  · Choose: Nonfat frozen yogurt (1 cup) / 0.3 g  · Choose: Frozen fruit bar / trace  · Avoid: Whipped cream (1 tbs) / 3.5 g  · Choose: Nondairy whipped topping (1 tbs) / 1 g  Condiments / Saturated Fat (g)  · Avoid: Mayonnaise (1 tbs) / 2 g  · Choose: Low-fat mayonnaise (1 tbs) / 1 g  · Avoid: Butter (1 tbs) / 7 g  · Choose: Extra light margarine (1 tbs) / 1 g  · Avoid: Coconut oil (1 tbs) / 11.8 g  · Choose: Olive oil (1 tbs) / 1.8 g  · Choose: Corn oil (1 tbs) / 1.7 g  · Choose: Safflower oil (1 tbs) / 1.2 g  · Choose: Sunflower oil (1 tbs) / 1.4 g  · Choose: Soybean oil (1 tbs) / 2.4 g  · Choose: Canola oil (1 tbs) / 1 g  Document Released: 01/30/2005 Document Revised: 05/27/2012 Document Reviewed: 07/21/2010  [employer]® Patient Information ©2014 [employer].

## 2012-12-20 NOTE — Progress Notes (Signed)
Subjective:    Patient ID: Selena Barnes, female    DOB: [birth year], 62 y.o.   MRN: [id number]  HPI Patient in today for annual physical exam and PAP- SHe is doing well- No complaints today- Patient Active Problem List   Diagnosis Date Noted  . Migraines 12/20/2012  . Insomnia 12/20/2012  . Depression 12/20/2012  . GERD (gastroesophageal reflux disease) 12/20/2012  . Hyperlipidemia LDL goal < 100 12/20/2012   Outpatient Encounter Prescriptions as of 12/20/2012  Medication Sig  . aspirin 81 MG tablet Take 81 mg by mouth daily.  . Calcium Carbonate-Vitamin D (CALTRATE 600+D PO) Take by mouth.  . Cholecalciferol (VITAMIN D3) 2000 UNITS capsule Take 2,000 Units by mouth 2 (two) times daily.  . diclofenac (CATAFLAM) 50 MG tablet Take 50 mg by mouth 3 (three) times daily.  . DULoxetine (CYMBALTA) 60 MG capsule Take 60 mg by mouth daily.  Marland Kitchen esomeprazole (NEXIUM) 40 MG capsule Take 40 mg by mouth daily at 12 noon.  Marland Kitchen estradiol (ESTRACE) 1 MG tablet Take 1 mg by mouth daily.  . fish oil-omega-3 fatty acids 1000 MG capsule Take 1 g by mouth daily. Take 4 pills qd  . metaxalone (SKELAXIN) 800 MG tablet Take 800 mg by mouth 3 (three) times daily.  . pravastatin (PRAVACHOL) 40 MG tablet Take 40 mg by mouth daily.  . promethazine (PHENERGAN) 25 MG tablet Take 25 mg by mouth every 6 (six) hours as needed for nausea or vomiting.  . rizatriptan (MAXALT) 10 MG tablet Take 10 mg by mouth as needed for migraine. May repeat in 2 hours if needed  . topiramate (TOPAMAX) 100 MG tablet Take 100 mg by mouth daily.  Marland Kitchen zolpidem (AMBIEN) 10 MG tablet Take 1 tablet (10 mg total) by mouth at bedtime as needed for sleep.       Review of Systems  Constitutional: Negative.   HENT: Negative.   Respiratory: Negative.   Cardiovascular: Negative.   Gastrointestinal: Negative.   Genitourinary: Negative.   Musculoskeletal: Negative.   Neurological: Negative.   Hematological: Negative.   Psychiatric/Behavioral:  Negative.   All other systems reviewed and are negative.       Objective:   Physical Exam  Constitutional: She is oriented to person, place, and time. She appears well-developed and well-nourished.  HENT:  Head: Normocephalic.  Right Ear: Hearing, tympanic membrane, external ear and ear canal normal.  Left Ear: Hearing, tympanic membrane, external ear and ear canal normal.  Nose: Nose normal.  Mouth/Throat: Uvula is midline and oropharynx is clear and moist.  Eyes: Conjunctivae and EOM are normal. Pupils are equal, round, and reactive to light.  Neck: Normal range of motion and full passive range of motion without pain. Neck supple. No JVD present. Carotid bruit is not present. No mass and no thyromegaly present.  Cardiovascular: Normal rate, normal heart sounds and intact distal pulses.   No murmur heard. Pulmonary/Chest: Effort normal and breath sounds normal. Right breast exhibits no inverted nipple, no mass, no nipple discharge, no skin change and no tenderness. Left breast exhibits no inverted nipple, no mass, no nipple discharge, no skin change and no tenderness.  Abdominal: Soft. Bowel sounds are normal. She exhibits no mass. There is no tenderness.  Genitourinary: Vagina normal and uterus normal. Guaiac negative stool. No breast swelling, tenderness, discharge or bleeding.  bimanual exam-No adnexal masses or tenderness. Vaginal cuff intact Large cystocele  Musculoskeletal: Normal range of motion.  Lymphadenopathy:    She has no cervical  adenopathy.  Neurological: She is alert and oriented to person, place, and time.  Skin: Skin is warm and dry.  Psychiatric: She has a normal mood and affect. Her behavior is normal. Judgment and thought content normal.    BP 123/62  Pulse 62  Temp(Src) 97.1 F (36.2 C) (Oral)  Ht 5\' 4"  (1.626 m)  Wt 152 lb (68.947 kg)  BMI 26.08 kg/m2       Assessment & Plan:   1. Annual physical exam   2. Migraines   3. Insomnia   4.  Depression   5. GERD (gastroesophageal reflux disease)   6. Hyperlipidemia LDL goal < 100    Orders Placed This Encounter  Procedures  . POCT urinalysis dipstick   Meds ordered this encounter  Medications  . estradiol (ESTRACE) 1 MG tablet    Sig: Take 1 mg by mouth daily.  . Cholecalciferol (VITAMIN D3) 2000 UNITS capsule    Sig: Take 2,000 Units by mouth 2 (two) times daily.  Marland Kitchen aspirin 81 MG tablet    Sig: Take 81 mg by mouth daily.  . diclofenac (CATAFLAM) 50 MG tablet    Sig: Take 50 mg by mouth 3 (three) times daily.  . fish oil-omega-3 fatty acids 1000 MG capsule    Sig: Take 1 g by mouth daily. Take 4 pills qd  . Calcium Carbonate-Vitamin D (CALTRATE 600+D PO)    Sig: Take by mouth.  . topiramate (TOPAMAX) 100 MG tablet    Sig: Take 100 mg by mouth daily.  Marland Kitchen esomeprazole (NEXIUM) 40 MG capsule    Sig: Take 40 mg by mouth daily at 12 noon.  . DULoxetine (CYMBALTA) 60 MG capsule    Sig: Take 60 mg by mouth daily.  . pravastatin (PRAVACHOL) 40 MG tablet    Sig: Take 40 mg by mouth daily.  . rizatriptan (MAXALT) 10 MG tablet    Sig: Take 10 mg by mouth as needed for migraine. May repeat in 2 hours if needed  . promethazine (PHENERGAN) 25 MG tablet    Sig: Take 25 mg by mouth every 6 (six) hours as needed for nausea or vomiting.  . metaxalone (SKELAXIN) 800 MG tablet    Sig: Take 800 mg by mouth 3 (three) times daily.    Continue all meds Labs pending Diet and exercise encouraged Health maintenance reviewed Follow up in 6 months  Selena Daphine Deutscher, FNP

## 2012-12-21 LAB — THYROID PANEL WITH TSH
Free Thyroxine Index: 2 (ref 1.2–4.9)
T3 Uptake Ratio: 26 % (ref 24–39)
T4, Total: 7.7 ug/dL (ref 4.5–12.0)
TSH: 2.17 u[IU]/mL (ref 0.450–4.500)

## 2012-12-21 LAB — CMP14+EGFR
ALT: 23 IU/L (ref 0–32)
AST: 20 IU/L (ref 0–40)
Albumin/Globulin Ratio: 1.6 (ref 1.1–2.5)
Albumin: 4.6 g/dL (ref 3.6–4.8)
Alkaline Phosphatase: 62 IU/L (ref 39–117)
BUN/Creatinine Ratio: 20 (ref 11–26)
BUN: 15 mg/dL (ref 8–27)
CO2: 27 mmol/L (ref 18–29)
Calcium: 9.6 mg/dL (ref 8.6–10.2)
Chloride: 103 mmol/L (ref 97–108)
Creatinine, Ser: 0.74 mg/dL (ref 0.57–1.00)
GFR calc Af Amer: 101 mL/min/{1.73_m2} (ref >59)
GFR calc non Af Amer: 88 mL/min/{1.73_m2} (ref >59)
Globulin, Total: 2.8 g/dL (ref 1.5–4.5)
Glucose: 90 mg/dL (ref 65–99)
Potassium: 4.7 mmol/L (ref 3.5–5.2)
Sodium: 141 mmol/L (ref 134–144)
Total Bilirubin: 0.3 mg/dL (ref 0.0–1.2)
Total Protein: 7.4 g/dL (ref 6.0–8.5)

## 2012-12-21 LAB — LIPID PANEL
Chol/HDL Ratio: 2.9 ratio units (ref 0.0–4.4)
Cholesterol, Total: 186 mg/dL (ref 100–199)
HDL: 64 mg/dL (ref >39)
LDL Calculated: 100 mg/dL — ABNORMAL HIGH (ref 0–99)
Triglycerides: 111 mg/dL (ref 0–149)
VLDL Cholesterol Cal: 22 mg/dL (ref 5–40)

## 2012-12-25 LAB — PAP IG W/ RFLX HPV ASCU: PAP Smear Comment: 0

## 2013-06-20 ENCOUNTER — Ambulatory Visit (INDEPENDENT_AMBULATORY_CARE_PROVIDER_SITE_OTHER): Payer: Medicare Other | Admitting: Nurse Practitioner

## 2013-06-20 ENCOUNTER — Encounter: Payer: Self-pay | Admitting: Nurse Practitioner

## 2013-06-20 VITALS — BP 119/66 | HR 66 | Temp 98.2°F | Ht 64.0 in | Wt 155.0 lb

## 2013-06-20 DIAGNOSIS — K219 Gastro-esophageal reflux disease without esophagitis: Secondary | ICD-10-CM

## 2013-06-20 DIAGNOSIS — G4700 Insomnia, unspecified: Secondary | ICD-10-CM | POA: Diagnosis not present

## 2013-06-20 DIAGNOSIS — G43909 Migraine, unspecified, not intractable, without status migrainosus: Secondary | ICD-10-CM

## 2013-06-20 DIAGNOSIS — Z23 Encounter for immunization: Secondary | ICD-10-CM | POA: Diagnosis not present

## 2013-06-20 DIAGNOSIS — F3289 Other specified depressive episodes: Secondary | ICD-10-CM

## 2013-06-20 DIAGNOSIS — F329 Major depressive disorder, single episode, unspecified: Secondary | ICD-10-CM

## 2013-06-20 DIAGNOSIS — E785 Hyperlipidemia, unspecified: Secondary | ICD-10-CM | POA: Diagnosis not present

## 2013-06-20 DIAGNOSIS — F32A Depression, unspecified: Secondary | ICD-10-CM

## 2013-06-20 MED ORDER — ZOLPIDEM TARTRATE 10 MG PO TABS: 10.0000 mg | ORAL_TABLET | Freq: Every evening | ORAL | Status: DC | PRN

## 2013-06-20 NOTE — Addendum Note (Signed)
Addended by: Josephine Cables on: 06/20/2013 11:31 AM   Modules accepted: Orders

## 2013-06-20 NOTE — Progress Notes (Signed)
   Subjective:    Patient ID: Selena Barnes, female    DOB: [birth year], 63 y.o.   MRN: [id number]  Hyperlipidemia This is a chronic problem. The current episode started more than 1 year ago. The problem is uncontrolled. Recent lipid tests were reviewed and are high. She has no history of diabetes, hypothyroidism or obesity. Pertinent negatives include no chest pain, focal sensory loss, leg pain, myalgias or shortness of breath. Current antihyperlipidemic treatment includes statins. The current treatment provides moderate improvement of lipids. Compliance problems include adherence to diet and adherence to exercise.  Risk factors for coronary artery disease include dyslipidemia and post-menopausal.  GERD Nexium working well- keeps symptoms under control- has occasional acid reflux depending on what she eats. Depression cymbalta working well. No c/o side effects Insomnia ambien 10 mg qhs- patient has been out of meds for several days and has not been sleeping well. Migraines Has one everyday- currently going to chiropractor for treatment- uses maxalt when she needs it. Takes topamax daiy.   Review of Systems  Respiratory: Negative for shortness of breath.   Cardiovascular: Negative for chest pain.  Musculoskeletal: Negative for myalgias.  Neurological: Positive for headaches.  All other systems reviewed and are negative.      Objective:   Physical Exam  Constitutional: She is oriented to person, place, and time. She appears well-developed and well-nourished.  HENT:  Nose: Nose normal.  Mouth/Throat: Oropharynx is clear and moist.  Eyes: EOM are normal.  Neck: Trachea normal, normal range of motion and full passive range of motion without pain. Neck supple. No JVD present. Carotid bruit is not present. No thyromegaly present.  Cardiovascular: Normal rate, regular rhythm, normal heart sounds and intact distal pulses.  Exam reveals no gallop and no friction rub.   No murmur  heard. Pulmonary/Chest: Effort normal and breath sounds normal.  Abdominal: Soft. Bowel sounds are normal. She exhibits no distension and no mass. There is no tenderness.  Musculoskeletal: Normal range of motion.  Lymphadenopathy:    She has no cervical adenopathy.  Neurological: She is alert and oriented to person, place, and time. She has normal reflexes. No cranial nerve deficit.  Skin: Skin is warm and dry.  Psychiatric: She has a normal mood and affect. Her behavior is normal. Judgment and thought content normal.   BP 119/66  Pulse 66  Temp(Src) 98.2 F (36.8 C) (Oral)  Ht $R'5\' 4"'CL$  (1.626 m)  Wt 155 lb (70.308 kg)  BMI 26.59 kg/m2         Assessment & Plan:   1. Insomnia   2. Hyperlipidemia LDL goal < 100   3. GERD (gastroesophageal reflux disease)   4. Depression   5. Migraines    Orders Placed This Encounter  Procedures  . CMP14+EGFR  . NMR, lipoprofile   Meds ordered this encounter  Medications  . zolpidem (AMBIEN) 10 MG tablet    Sig: Take 1 tablet (10 mg total) by mouth at bedtime as needed for sleep.    Dispense:  90 tablet    Refill:  1    Order Specific Question:  Supervising Provider    Answer:  Chipper Herb [[id number]]    Labs pending Health maintenance reviewed Diet and exercise encouraged Continue all meds Follow up  In 3 months   [place], FNP

## 2013-06-21 LAB — CMP14+EGFR
ALT: 20 IU/L (ref 0–32)
AST: 21 IU/L (ref 0–40)
Albumin/Globulin Ratio: 1.6 (ref 1.1–2.5)
Albumin: 4.3 g/dL (ref 3.6–4.8)
Alkaline Phosphatase: 55 IU/L (ref 39–117)
BUN/Creatinine Ratio: 25 (ref 11–26)
BUN: 18 mg/dL (ref 8–27)
CO2: 28 mmol/L (ref 18–29)
Calcium: 9.8 mg/dL (ref 8.7–10.3)
Chloride: 99 mmol/L (ref 97–108)
Creatinine, Ser: 0.73 mg/dL (ref 0.57–1.00)
GFR calc Af Amer: 102 mL/min/{1.73_m2} (ref >59)
GFR calc non Af Amer: 89 mL/min/{1.73_m2} (ref >59)
Globulin, Total: 2.7 g/dL (ref 1.5–4.5)
Glucose: 97 mg/dL (ref 65–99)
Potassium: 5 mmol/L (ref 3.5–5.2)
Sodium: 140 mmol/L (ref 134–144)
Total Bilirubin: <0.2 mg/dL (ref 0.0–1.2)
Total Protein: 7 g/dL (ref 6.0–8.5)

## 2013-06-21 LAB — NMR, LIPOPROFILE
Cholesterol: 233 mg/dL — ABNORMAL HIGH (ref <200)
HDL Cholesterol by NMR: 58 mg/dL (ref >=40)
HDL Particle Number: 36.5 umol/L (ref >=30.5)
LDL Particle Number: 1927 nmol/L — ABNORMAL HIGH (ref <1000)
LDL Size: 21.2 nm (ref >20.5)
LDLC SERPL CALC-MCNC: 155 mg/dL — ABNORMAL HIGH (ref <100)
LP-IR Score: 26 (ref <=45)
Small LDL Particle Number: 918 nmol/L — ABNORMAL HIGH (ref <=527)
Triglycerides by NMR: 102 mg/dL (ref <150)

## 2013-06-23 ENCOUNTER — Telehealth: Payer: Self-pay | Admitting: *Deleted

## 2013-06-23 NOTE — Telephone Encounter (Signed)
Patient came by office today due to having a area on her left upper arm that is swollen and red from zostavax injection. Patient was notified that it looks like a localized reaction and for her to take some benadryl, mark the area to see if it gets better and if it does not improve that she needs to call us so we can get her appt to come in

## 2013-06-24 ENCOUNTER — Telehealth: Payer: Self-pay | Admitting: Nurse Practitioner

## 2013-06-24 NOTE — Telephone Encounter (Signed)
Patient on pravahcol- not strong enough will sh eagree to try something else?pp

## 2013-06-25 NOTE — Telephone Encounter (Signed)
Patient agrees to try something to try for her cholesterol. Patient would like a 90 day supply with 3 refills to mail to [facility]

## 2013-06-26 MED ORDER — ATORVASTATIN CALCIUM 40 MG PO TABS: 40.0000 mg | ORAL_TABLET | Freq: Every day | ORAL | Status: DC

## 2013-06-26 NOTE — Telephone Encounter (Signed)
lipitor 40mg  sent to mail order

## 2014-01-01 ENCOUNTER — Telehealth: Payer: Self-pay | Admitting: Family Medicine

## 2014-01-01 NOTE — Telephone Encounter (Signed)
Appointment scheduled for 12/10 with Ronnald Collum at 8 am for a follow up and bone spur

## 2014-01-22 ENCOUNTER — Ambulatory Visit (INDEPENDENT_AMBULATORY_CARE_PROVIDER_SITE_OTHER): Payer: Medicare Other | Admitting: *Deleted

## 2014-01-22 ENCOUNTER — Ambulatory Visit (INDEPENDENT_AMBULATORY_CARE_PROVIDER_SITE_OTHER): Payer: Medicare Other | Admitting: Nurse Practitioner

## 2014-01-22 ENCOUNTER — Ambulatory Visit (INDEPENDENT_AMBULATORY_CARE_PROVIDER_SITE_OTHER): Payer: Medicare Other

## 2014-01-22 ENCOUNTER — Encounter: Payer: Self-pay | Admitting: Nurse Practitioner

## 2014-01-22 VITALS — BP 124/68 | HR 68 | Temp 98.2°F | Ht 64.0 in | Wt 148.0 lb

## 2014-01-22 DIAGNOSIS — N951 Menopausal and female climacteric states: Secondary | ICD-10-CM

## 2014-01-22 DIAGNOSIS — Z23 Encounter for immunization: Secondary | ICD-10-CM

## 2014-01-22 DIAGNOSIS — F329 Major depressive disorder, single episode, unspecified: Secondary | ICD-10-CM

## 2014-01-22 DIAGNOSIS — G4700 Insomnia, unspecified: Secondary | ICD-10-CM | POA: Diagnosis not present

## 2014-01-22 DIAGNOSIS — G43919 Migraine, unspecified, intractable, without status migrainosus: Secondary | ICD-10-CM

## 2014-01-22 DIAGNOSIS — E785 Hyperlipidemia, unspecified: Secondary | ICD-10-CM | POA: Diagnosis not present

## 2014-01-22 DIAGNOSIS — M7731 Calcaneal spur, right foot: Secondary | ICD-10-CM | POA: Diagnosis not present

## 2014-01-22 DIAGNOSIS — K219 Gastro-esophageal reflux disease without esophagitis: Secondary | ICD-10-CM

## 2014-01-22 DIAGNOSIS — F32A Depression, unspecified: Secondary | ICD-10-CM

## 2014-01-22 MED ORDER — DICLOFENAC POTASSIUM 50 MG PO TABS: 50.0000 mg | ORAL_TABLET | Freq: Three times a day (TID) | ORAL | Status: DC

## 2014-01-22 MED ORDER — DULOXETINE HCL 60 MG PO CPEP: 60.0000 mg | ORAL_CAPSULE | Freq: Every day | ORAL | Status: DC

## 2014-01-22 MED ORDER — ZOLPIDEM TARTRATE 10 MG PO TABS: 10.0000 mg | ORAL_TABLET | Freq: Every evening | ORAL | Status: DC | PRN

## 2014-01-22 MED ORDER — ROSUVASTATIN CALCIUM 10 MG PO TABS: 10.0000 mg | ORAL_TABLET | Freq: Every day | ORAL | Status: DC

## 2014-01-22 MED ORDER — ESOMEPRAZOLE MAGNESIUM 40 MG PO CPDR: 40.0000 mg | DELAYED_RELEASE_CAPSULE | Freq: Every day | ORAL | Status: DC

## 2014-01-22 MED ORDER — ESTRADIOL 1 MG PO TABS: 1.0000 mg | ORAL_TABLET | Freq: Every day | ORAL | Status: DC

## 2014-01-22 NOTE — Progress Notes (Signed)
Subjective:    Patient ID: Selena Barnes, female    DOB: 1951-01-03, 63 y.o.   MRN: [id number]   Patient here today for follow up of chronic medical problems.    Hyperlipidemia This is a chronic problem. The current episode started more than 1 year ago. The problem is uncontrolled. Recent lipid tests were reviewed and are high. Pertinent negatives include no chest pain, myalgias or shortness of breath. Current antihyperlipidemic treatment includes statins. The current treatment provides moderate improvement of lipids. Compliance problems include adherence to diet and adherence to exercise.  Risk factors for coronary artery disease include dyslipidemia and post-menopausal.  GERD Nexium working well- keeps symptoms under control- has occasional acid reflux depending on what she eats. Depression cymbalta working well. No c/o side effects Insomnia ambien 10 mg qhs- patient has been out of meds for several days and has not been sleeping well. Migraines Has one everyday- currently going to chiropractor for treatment- uses maxalt when she needs it. Takes topamax daiy.  * Patient has a history of bone spur in right heel- started flaring up 2 weeks ago- hurts real bad to walk.  Review of Systems  Respiratory: Negative for shortness of breath.   Cardiovascular: Negative for chest pain.  Musculoskeletal: Negative for myalgias.  Neurological: Positive for headaches.  All other systems reviewed and are negative.      Objective:   Physical Exam  Constitutional: She is oriented to person, place, and time. She appears well-developed and well-nourished.  HENT:  Nose: Nose normal.  Mouth/Throat: Oropharynx is clear and moist.  Eyes: EOM are normal.  Neck: Trachea normal, normal range of motion and full passive range of motion without pain. Neck supple. No JVD present. Carotid bruit is not present. No thyromegaly present.  Cardiovascular: Normal rate, regular rhythm, normal heart sounds and  intact distal pulses.  Exam reveals no gallop and no friction rub.   No murmur heard. Pulmonary/Chest: Effort normal and breath sounds normal.  Abdominal: Soft. Bowel sounds are normal. She exhibits no distension and no mass. There is no tenderness.  Musculoskeletal: Normal range of motion.  Lymphadenopathy:    She has no cervical adenopathy.  Neurological: She is alert and oriented to person, place, and time. She has normal reflexes.  Skin: Skin is warm and dry.  Psychiatric: She has a normal mood and affect. Her behavior is normal. Judgment and thought content normal.   BP 124/68 mmHg  Pulse 68  Temp(Src) 98.2 F (36.8 C) (Oral)  Ht 5' 4"  (1.626 m)  Wt 148 lb (67.132 kg)  BMI 25.39 kg/m2  Chest x ray- questionable nodule left upper lobe- Preliminary reading by Ronnald Collum, FNP  [facility] Left foot- heel spur-Preliminary reading by Ronnald Collum, FNP  [facility] EKG- NSR        Assessment & Plan:   1. Heel spur, right Get cushion for shoe - DG Foot Complete Right; Future - diclofenac (CATAFLAM) 50 MG tablet; Take 1 tablet (50 mg total) by mouth 3 (three) times daily.  Dispense: 270 tablet; Refill: 1  2. Intractable migraine without status migrainosus, unspecified migraine type Avoid caffeine  3. Gastroesophageal reflux disease without esophagitis Avoid spicy and fatty foods - esomeprazole (NEXIUM) 40 MG capsule; Take 1 capsule (40 mg total) by mouth daily at 12 noon.  Dispense: 90 capsule; Refill: 3  4. Insomnia Bedtime ritual - zolpidem (AMBIEN) 10 MG tablet; Take 1 tablet (10 mg total) by mouth at bedtime as needed for sleep.  Dispense: 90 tablet; Refill: 1  5. Depression stres smanagement - DULoxetine (CYMBALTA) 60 MG capsule; Take 1 capsule (60 mg total) by mouth daily.  Dispense: 90 capsule; Refill: 3  6. Hyperlipidemia with target LDL less than 100 Low fat diet and exercise encouraged - DG Chest 2 View; Future - EKG 12-Lead - CMP14+EGFR - NMR, lipoprofile -  rosuvastatin (CRESTOR) 10 MG tablet; Take 1 tablet (10 mg total) by mouth daily.  Dispense: 90 tablet; Refill: 1  7. Menopausal flushing - estradiol (ESTRACE) 1 MG tablet; Take 1 tablet (1 mg total) by mouth daily.  Dispense: 90 tablet; Refill: 3   hemoccult cards given to patient with directions Labs pending Health maintenance reviewed Diet and exercise encouraged Continue all meds Follow up  In 3 months   [place], FNP

## 2014-01-22 NOTE — Patient Instructions (Signed)
Heel Spur A heel spur is a hook of bone that can form on the calcaneus (the heel bone and the largest bone of the foot). Heel spurs are often associated with plantar fasciitis and usually come in people who have had the problem for an extended period of time. The cause of the relationship is unknown. The pain associated with them is thought to be caused by an inflammation (soreness and redness) of the plantar fascia rather than the spur itself. The plantar fascia is a thick fibrous like tissue that runs from the calcaneus (heel bone) to the ball of the foot. This strong, tight tissue helps maintain the arch of your foot. It helps distribute the weight across your foot as you walk or run. Stresses placed on the plantar fascia can be tremendous. When it is inflamed normal activities become painful. Pain is worse in the morning after sleeping. After sleeping the plantar fascia is tight. The first movements stretch the fascia and this causes pain. As the tendon loosens, the pain usually gets better. It often returns with too much standing or walking.  About 70% of patients with plantar fasciitis have a heel spur. About half of people without foot pain also have heel spurs. DIAGNOSIS  The diagnosis of a heel spur is made by X-ray. The X-ray shows a hook of bone protruding from the bottom of the calcaneus at the point where the plantar fascia is attached to the heel bone.  TREATMENT  It is necessary to find out what is causing the stretching of the plantar fascia. If the cause is over-pronation (flat feet), orthotics and proper foot ware may help.  Stretching exercises, losing weight, wearing shoes that have a cushioned heel that absorbs shock, and elevating the heel with the use of a heel cradle, heel cup, or orthotics may all help. Heel cradles and heel cups provide extra comfort and cushion to the heel, and reduce the amount of shock to the sore area. AVOIDING THE PAIN OF PLANTAR FASCIITIS AND HEEL  SPURS  Consult a sports medicine professional before beginning a new exercise program.  Walking programs offer a good workout. There is a lower chance of overuse injuries common to the runners. There is less impact and less jarring of the joints.  Begin all new exercise programs slowly. If problems or pains develop, decrease the amount of time or distance until you are at a comfortable level.  Wear good shoes and replace them regularly.  Stretch your foot and the heel cords at the back of the ankle (Achilles tendons) both before and after exercise.  Run or exercise on even surfaces that are not hard. For example, asphalt is better than pavement.  Do not run barefoot on hard surfaces.  If using a treadmill, vary the incline.  Do not continue to workout if you have foot or joint problems. Seek professional help if they do not improve. HOME CARE INSTRUCTIONS   Avoid activities that cause you pain until you recover.  Use ice or cold packs to the problem or painful areas after working out.  Only take over-the-counter or prescription medicines for pain, discomfort, or fever as directed by your caregiver.  Soft shoe inserts or athletic shoes with air or gel sole cushions may be helpful.  If problems continue or become more severe, consult a sports medicine caregiver. Cortisone is a potent anti-inflammatory medication that may be injected into the painful area. You can discuss this treatment with your caregiver. MAKE SURE YOU:  Understand these instructions.  Will watch your condition.  Will get help right away if you are not doing well or get worse. Document Released: 03/08/2005 Document Revised: 04/24/2011 Document Reviewed: 04/02/2013 ExitCare Patient Information 2015 [employer]. This information is not intended to replace advice given to you by your health care provider. Make sure you discuss any questions you have with your health care provider.  

## 2014-01-23 ENCOUNTER — Other Ambulatory Visit: Payer: Medicare Other

## 2014-01-23 DIAGNOSIS — Z1212 Encounter for screening for malignant neoplasm of rectum: Secondary | ICD-10-CM | POA: Diagnosis not present

## 2014-01-23 LAB — NMR, LIPOPROFILE
Cholesterol: 226 mg/dL — ABNORMAL HIGH (ref 100–199)
HDL Cholesterol by NMR: 63 mg/dL (ref >39)
HDL Particle Number: 34.5 umol/L (ref >=30.5)
LDL Particle Number: 1397 nmol/L — ABNORMAL HIGH (ref <1000)
LDL Size: 21.1 nm (ref >20.5)
LDL-C: 140 mg/dL — ABNORMAL HIGH (ref 0–99)
LP-IR Score: <25 (ref <=45)
Small LDL Particle Number: 95 nmol/L (ref <=527)
Triglycerides by NMR: 113 mg/dL (ref 0–149)

## 2014-01-23 LAB — CMP14+EGFR
ALT: 23 IU/L (ref 0–32)
AST: 23 IU/L (ref 0–40)
Albumin/Globulin Ratio: 1.4 (ref 1.1–2.5)
Albumin: 4.3 g/dL (ref 3.6–4.8)
Alkaline Phosphatase: 54 IU/L (ref 39–117)
BUN/Creatinine Ratio: 20 (ref 11–26)
BUN: 15 mg/dL (ref 8–27)
CO2: 27 mmol/L (ref 18–29)
Calcium: 9.7 mg/dL (ref 8.7–10.3)
Chloride: 103 mmol/L (ref 97–108)
Creatinine, Ser: 0.75 mg/dL (ref 0.57–1.00)
GFR calc Af Amer: 99 mL/min/{1.73_m2} (ref >59)
GFR calc non Af Amer: 86 mL/min/{1.73_m2} (ref >59)
Globulin, Total: 3 g/dL (ref 1.5–4.5)
Glucose: 92 mg/dL (ref 65–99)
Potassium: 5 mmol/L (ref 3.5–5.2)
Sodium: 140 mmol/L (ref 134–144)
Total Bilirubin: 0.5 mg/dL (ref 0.0–1.2)
Total Protein: 7.3 g/dL (ref 6.0–8.5)

## 2014-01-23 NOTE — Progress Notes (Signed)
Lab only 

## 2014-01-25 LAB — FECAL OCCULT BLOOD, IMMUNOCHEMICAL: Fecal Occult Bld: NEGATIVE

## 2014-01-26 ENCOUNTER — Encounter: Payer: Self-pay | Admitting: *Deleted

## 2014-02-03 ENCOUNTER — Ambulatory Visit (INDEPENDENT_AMBULATORY_CARE_PROVIDER_SITE_OTHER): Payer: Medicare Other | Admitting: Nurse Practitioner

## 2014-02-03 ENCOUNTER — Ambulatory Visit (INDEPENDENT_AMBULATORY_CARE_PROVIDER_SITE_OTHER): Payer: Medicare Other

## 2014-02-03 ENCOUNTER — Encounter: Payer: Self-pay | Admitting: Nurse Practitioner

## 2014-02-03 VITALS — BP 142/76 | HR 74 | Temp 97.2°F | Ht 64.0 in | Wt 150.0 lb

## 2014-02-03 DIAGNOSIS — S6992XA Unspecified injury of left wrist, hand and finger(s), initial encounter: Secondary | ICD-10-CM

## 2014-02-03 DIAGNOSIS — S60222A Contusion of left hand, initial encounter: Secondary | ICD-10-CM

## 2014-02-03 DIAGNOSIS — M7731 Calcaneal spur, right foot: Secondary | ICD-10-CM | POA: Diagnosis not present

## 2014-02-03 DIAGNOSIS — S6000XA Contusion of unspecified finger without damage to nail, initial encounter: Secondary | ICD-10-CM | POA: Diagnosis not present

## 2014-02-03 NOTE — Progress Notes (Signed)
   Subjective:    Patient ID: Selena Barnes, female    DOB: 1951-01-26, 63 y.o.   MRN: [id number]  HPI Patient dropped a board on her left foot Saturday- hand is really bruised and hurts to move. Patient also c/o bone spur to right  Heel is still hurting. Is waiting on voltaren rx.    Review of Systems  Constitutional: Negative.   HENT: Negative.   Respiratory: Negative.   Cardiovascular: Negative.   Genitourinary: Negative.   Neurological: Negative.   Psychiatric/Behavioral: Negative.   All other systems reviewed and are negative.      Objective:   Physical Exam  Constitutional: She is oriented to person, place, and time. She appears well-developed and well-nourished.  Cardiovascular: Normal rate and normal heart sounds.   Pulmonary/Chest: Effort normal.  Musculoskeletal:  echymosis of dorsal surface of left hand and left ring finger. Limited ROM of left ring finger due to pain with flexion.  Neurological: She is alert and oriented to person, place, and time.   BP 142/76 mmHg  Pulse 74  Temp(Src) 97.2 F (36.2 C) (Oral)  Ht 5\' 4"  (1.626 m)  Wt 150 lb (68.04 kg)  BMI 25.73 kg/m2   Xray left hand and ring finger- no fracture visible-Preliminary reading by Ronnald Collum, FNP  [facility]      Assessment & Plan:  1. Hand injury, left, initial encounter 2. Contusion of left hand including fingers, initial encounter Ice Motrin OTC every 6 hours - do not take with diclofenac - DG Hand Complete Left; Future   3. Heel spur, right Heel cup for shoes Start diclofenac when get from mail order  Mary-Margaret Hassell [place], FNP

## 2014-02-03 NOTE — Patient Instructions (Signed)
Contusion °A contusion is a deep bruise. Contusions are the result of an injury that caused bleeding under the skin. The contusion may turn blue, purple, or yellow. Minor injuries will give you a painless contusion, but more severe contusions may stay painful and swollen for a few weeks.  °CAUSES  °A contusion is usually caused by a blow, trauma, or direct force to an area of the body. °SYMPTOMS  °· Swelling and redness of the injured area. °· Bruising of the injured area. °· Tenderness and soreness of the injured area. °· Pain. °DIAGNOSIS  °The diagnosis can be made by taking a history and physical exam. An X-ray, CT scan, or MRI may be needed to determine if there were any associated injuries, such as fractures. °TREATMENT  °Specific treatment will depend on what area of the body was injured. In general, the best treatment for a contusion is resting, icing, elevating, and applying cold compresses to the injured area. Over-the-counter medicines may also be recommended for pain control. Ask your caregiver what the best treatment is for your contusion. °HOME CARE INSTRUCTIONS  °· Put ice on the injured area. °¨ Put ice in a plastic bag. °¨ Place a towel between your skin and the bag. °¨ Leave the ice on for 15-20 minutes, 3-4 times a day, or as directed by your health care provider. °· Only take over-the-counter or prescription medicines for pain, discomfort, or fever as directed by your caregiver. Your caregiver may recommend avoiding anti-inflammatory medicines (aspirin, ibuprofen, and naproxen) for 48 hours because these medicines may increase bruising. °· Rest the injured area. °· If possible, elevate the injured area to reduce swelling. °SEEK IMMEDIATE MEDICAL CARE IF:  °· You have increased bruising or swelling. °· You have pain that is getting worse. °· Your swelling or pain is not relieved with medicines. °MAKE SURE YOU:  °· Understand these instructions. °· Will watch your condition. °· Will get help right  away if you are not doing well or get worse. °Document Released: 11/09/2004 Document Revised: 02/04/2013 Document Reviewed: 12/05/2010 °ExitCare® Patient Information ©2015 [employer]. This information is not intended to replace advice given to you by your health care provider. Make sure you discuss any questions you have with your health care provider. ° °

## 2014-02-08 DIAGNOSIS — H40033 Anatomical narrow angle, bilateral: Secondary | ICD-10-CM | POA: Diagnosis not present

## 2014-02-08 DIAGNOSIS — H2513 Age-related nuclear cataract, bilateral: Secondary | ICD-10-CM | POA: Diagnosis not present

## 2014-02-23 ENCOUNTER — Encounter: Payer: Self-pay | Admitting: *Deleted

## 2014-02-27 ENCOUNTER — Other Ambulatory Visit: Payer: Self-pay | Admitting: Nurse Practitioner

## 2014-02-27 ENCOUNTER — Telehealth: Payer: Self-pay | Admitting: Nurse Practitioner

## 2014-02-27 DIAGNOSIS — N951 Menopausal and female climacteric states: Secondary | ICD-10-CM

## 2014-02-27 MED ORDER — PROMETHAZINE HCL 25 MG PO TABS: 25.0000 mg | ORAL_TABLET | Freq: Four times a day (QID) | ORAL | Status: DC | PRN

## 2014-02-27 MED ORDER — TOPIRAMATE 100 MG PO TABS: 100.0000 mg | ORAL_TABLET | Freq: Every day | ORAL | Status: DC

## 2014-02-27 MED ORDER — RIZATRIPTAN BENZOATE 10 MG PO TBDP: 10.0000 mg | ORAL_TABLET | ORAL | Status: DC | PRN

## 2014-02-27 MED ORDER — ESTRADIOL 1 MG PO TABS: 1.0000 mg | ORAL_TABLET | Freq: Every day | ORAL | Status: DC

## 2014-02-27 MED ORDER — METAXALONE 800 MG PO TABS: 800.0000 mg | ORAL_TABLET | Freq: Three times a day (TID) | ORAL | Status: DC

## 2014-02-27 NOTE — Telephone Encounter (Signed)
Patient came in to office and we got meds fixed

## 2014-03-16 ENCOUNTER — Other Ambulatory Visit: Payer: Self-pay | Admitting: *Deleted

## 2014-03-16 MED ORDER — PROMETHAZINE HCL 25 MG PO TABS: 25.0000 mg | ORAL_TABLET | Freq: Four times a day (QID) | ORAL | Status: DC | PRN

## 2014-03-16 MED ORDER — TOPIRAMATE 100 MG PO TABS: 100.0000 mg | ORAL_TABLET | Freq: Every day | ORAL | Status: DC

## 2014-03-16 MED ORDER — METAXALONE 800 MG PO TABS: 800.0000 mg | ORAL_TABLET | Freq: Three times a day (TID) | ORAL | Status: DC

## 2014-04-24 ENCOUNTER — Ambulatory Visit: Payer: Medicare Other | Admitting: Nurse Practitioner

## 2014-05-11 ENCOUNTER — Telehealth: Payer: Self-pay | Admitting: Nurse Practitioner

## 2014-05-11 DIAGNOSIS — Z1211 Encounter for screening for malignant neoplasm of colon: Secondary | ICD-10-CM

## 2014-05-11 NOTE — Telephone Encounter (Signed)
See attached note.

## 2014-05-19 ENCOUNTER — Other Ambulatory Visit: Payer: Self-pay | Admitting: Nurse Practitioner

## 2014-05-19 DIAGNOSIS — Z1231 Encounter for screening mammogram for malignant neoplasm of breast: Secondary | ICD-10-CM

## 2014-05-21 ENCOUNTER — Encounter: Payer: Self-pay | Admitting: Nurse Practitioner

## 2014-05-21 ENCOUNTER — Ambulatory Visit (INDEPENDENT_AMBULATORY_CARE_PROVIDER_SITE_OTHER): Payer: Medicare Other | Admitting: Nurse Practitioner

## 2014-05-21 VITALS — BP 113/66 | HR 76 | Temp 97.1°F | Ht 64.0 in | Wt 153.0 lb

## 2014-05-21 DIAGNOSIS — F329 Major depressive disorder, single episode, unspecified: Secondary | ICD-10-CM

## 2014-05-21 DIAGNOSIS — G43919 Migraine, unspecified, intractable, without status migrainosus: Secondary | ICD-10-CM | POA: Diagnosis not present

## 2014-05-21 DIAGNOSIS — G4700 Insomnia, unspecified: Secondary | ICD-10-CM | POA: Diagnosis not present

## 2014-05-21 DIAGNOSIS — Z6826 Body mass index (BMI) 26.0-26.9, adult: Secondary | ICD-10-CM | POA: Diagnosis not present

## 2014-05-21 DIAGNOSIS — K219 Gastro-esophageal reflux disease without esophagitis: Secondary | ICD-10-CM | POA: Diagnosis not present

## 2014-05-21 DIAGNOSIS — E785 Hyperlipidemia, unspecified: Secondary | ICD-10-CM

## 2014-05-21 DIAGNOSIS — M7731 Calcaneal spur, right foot: Secondary | ICD-10-CM

## 2014-05-21 DIAGNOSIS — F32A Depression, unspecified: Secondary | ICD-10-CM

## 2014-05-21 DIAGNOSIS — M7730 Calcaneal spur, unspecified foot: Secondary | ICD-10-CM | POA: Insufficient documentation

## 2014-05-21 MED ORDER — RIZATRIPTAN BENZOATE 10 MG PO TBDP: 10.0000 mg | ORAL_TABLET | ORAL | Status: DC | PRN

## 2014-05-21 MED ORDER — BUPIVACAINE HCL 0.25 % IJ SOLN
1.0000 mL | Freq: Once | INTRAMUSCULAR | Status: AC
  Administered 2014-05-21: 1 mL

## 2014-05-21 MED ORDER — TRIAMCINOLONE ACETONIDE 40 MG/ML IJ SUSP
60.0000 mg | Freq: Once | INTRAMUSCULAR | Status: AC
  Administered 2014-05-21: 60 mg via INTRAMUSCULAR

## 2014-05-21 MED ORDER — ROSUVASTATIN CALCIUM 10 MG PO TABS: 10.0000 mg | ORAL_TABLET | Freq: Every day | ORAL | Status: DC

## 2014-05-21 NOTE — Progress Notes (Signed)
Subjective:    Patient ID: Selena Barnes, female    DOB: 11/08/1950, 64 y.o.   MRN: [id number]   Patient here today for follow up of chronic medical problems.    Hyperlipidemia This is a chronic problem. The current episode started more than 1 year ago. The problem is uncontrolled. Recent lipid tests were reviewed and are high. Pertinent negatives include no chest pain, myalgias or shortness of breath. Current antihyperlipidemic treatment includes statins. The current treatment provides moderate improvement of lipids. Compliance problems include adherence to diet and adherence to exercise.  Risk factors for coronary artery disease include dyslipidemia and post-menopausal.  GERD Nexium working well- keeps symptoms under control- has occasional acid reflux depending on what she eats. Depression cymbalta working well. No c/o side effects Insomnia ambien 10 mg qhs- patient has been out of meds for several days and has not been sleeping well. Migraines Has one everyday- currently going to chiropractor for treatment- uses maxalt when she needs it. Takes topamax daiy. Right heel spur Still having pain - wear a heel cup i her shoes- seems to bother her more at night. Wants to have injected  Review of Systems  Constitutional: Negative.   Respiratory: Negative for shortness of breath.   Cardiovascular: Negative for chest pain.  Musculoskeletal: Negative for myalgias.  Neurological: Positive for headaches.  All other systems reviewed and are negative.      Objective:   Physical Exam  Constitutional: She is oriented to person, place, and time. She appears well-developed and well-nourished.  HENT:  Nose: Nose normal.  Mouth/Throat: Oropharynx is clear and moist.  Eyes: EOM are normal.  Neck: Trachea normal, normal range of motion and full passive range of motion without pain. Neck supple. No JVD present. Carotid bruit is not present. No thyromegaly present.  Cardiovascular: Normal rate,  regular rhythm, normal heart sounds and intact distal pulses.  Exam reveals no gallop and no friction rub.   No murmur heard. Pulmonary/Chest: Effort normal and breath sounds normal.  Abdominal: Soft. Bowel sounds are normal. She exhibits no distension and no mass. There is no tenderness.  Musculoskeletal: Normal range of motion.  Pain right heel on plapation  Lymphadenopathy:    She has no cervical adenopathy.  Neurological: She is alert and oriented to person, place, and time. She has normal reflexes.  Skin: Skin is warm and dry.  Psychiatric: She has a normal mood and affect. Her behavior is normal. Judgment and thought content normal.   BP 113/66 mmHg  Pulse 76  Temp(Src) 97.1 F (36.2 C) (Oral)  Ht _0  (1.626 m)  Wt 153 lb (69.4 kg)  BMI 26.25 kg/m2  Procedure- marcaie 0.25 % plain with kenalog 72m -- 12meach injected after  Right heel cleaned with betadine- bandaid applied      Assessment & Plan:   1. BMI 26.0-26.9,adult Discussed diet and exercise for person with BMI >25 Will recheck weight in 3-6 months   2. Gastroesophageal reflux disease without esophagitis Avoid spicy foods Do not eat 2 hours prior to bedtime  3. Insomnia Bedtime ritua;  4. Hyperlipidemia with target LDL less than 100 Low fta diet - rosuvastatin (CRESTOR) 10 MG tablet; Take 1 tablet (10 mg total) by mouth daily.  Dispense: 90 tablet; Refill: 1 - CMP14+EGFR - NMR, lipoprofile  5. Depression Stress management  6. Heel spur, right Injected today - bupivacaine (MARCAINE) 0.25 % (with pres) injection 1 mL; 1 mL by Infiltration route once. - triamcinolone  acetonide (KENALOG-40) injection 60 mg; Inject 1.5 mLs (60 mg total) into the muscle once.  7. Intractable migraine without status migrainosus, unspecified migraine type  - rizatriptan (MAXALT-MLT) 10 MG disintegrating tablet; Take 1 tablet (10 mg total) by mouth as needed for migraine. May repeat in 2 hours if needed  Dispense: 18  tablet; Refill: 3    Labs pending Health maintenance reviewed Diet and exercise encouraged Continue all meds Follow up  In 3 months   [place], FNP

## 2014-05-21 NOTE — Patient Instructions (Signed)
Heel Spur A heel spur is a hook of bone that can form on the calcaneus (the heel bone and the largest bone of the foot). Heel spurs are often associated with plantar fasciitis and usually come in people who have had the problem for an extended period of time. The cause of the relationship is unknown. The pain associated with them is thought to be caused by an inflammation (soreness and redness) of the plantar fascia rather than the spur itself. The plantar fascia is a thick fibrous like tissue that runs from the calcaneus (heel bone) to the ball of the foot. This strong, tight tissue helps maintain the arch of your foot. It helps distribute the weight across your foot as you walk or run. Stresses placed on the plantar fascia can be tremendous. When it is inflamed normal activities become painful. Pain is worse in the morning after sleeping. After sleeping the plantar fascia is tight. The first movements stretch the fascia and this causes pain. As the tendon loosens, the pain usually gets better. It often returns with too much standing or walking.  About 70% of patients with plantar fasciitis have a heel spur. About half of people without foot pain also have heel spurs. DIAGNOSIS  The diagnosis of a heel spur is made by X-ray. The X-ray shows a hook of bone protruding from the bottom of the calcaneus at the point where the plantar fascia is attached to the heel bone.  TREATMENT  It is necessary to find out what is causing the stretching of the plantar fascia. If the cause is over-pronation (flat feet), orthotics and proper foot ware may help.  Stretching exercises, losing weight, wearing shoes that have a cushioned heel that absorbs shock, and elevating the heel with the use of a heel cradle, heel cup, or orthotics may all help. Heel cradles and heel cups provide extra comfort and cushion to the heel, and reduce the amount of shock to the sore area. AVOIDING THE PAIN OF PLANTAR FASCIITIS AND HEEL  SPURS  Consult a sports medicine professional before beginning a new exercise program.  Walking programs offer a good workout. There is a lower chance of overuse injuries common to the runners. There is less impact and less jarring of the joints.  Begin all new exercise programs slowly. If problems or pains develop, decrease the amount of time or distance until you are at a comfortable level.  Wear good shoes and replace them regularly.  Stretch your foot and the heel cords at the back of the ankle (Achilles tendons) both before and after exercise.  Run or exercise on even surfaces that are not hard. For example, asphalt is better than pavement.  Do not run barefoot on hard surfaces.  If using a treadmill, vary the incline.  Do not continue to workout if you have foot or joint problems. Seek professional help if they do not improve. HOME CARE INSTRUCTIONS   Avoid activities that cause you pain until you recover.  Use ice or cold packs to the problem or painful areas after working out.  Only take over-the-counter or prescription medicines for pain, discomfort, or fever as directed by your caregiver.  Soft shoe inserts or athletic shoes with air or gel sole cushions may be helpful.  If problems continue or become more severe, consult a sports medicine caregiver. Cortisone is a potent anti-inflammatory medication that may be injected into the painful area. You can discuss this treatment with your caregiver. MAKE SURE YOU:  Understand these instructions.  Will watch your condition.  Will get help right away if you are not doing well or get worse. Document Released: 03/08/2005 Document Revised: 04/24/2011 Document Reviewed: 04/02/2013 [employer] Patient Information 2015 [place], [place]. This information is not intended to replace advice given to you by your health care provider. Make sure you discuss any questions you have with your health care provider.

## 2014-05-22 LAB — CMP14+EGFR
ALT: 17 IU/L (ref 0–32)
AST: 21 IU/L (ref 0–40)
Albumin/Globulin Ratio: 1.7 (ref 1.1–2.5)
Albumin: 4.5 g/dL (ref 3.6–4.8)
Alkaline Phosphatase: 53 IU/L (ref 39–117)
BUN/Creatinine Ratio: 22 (ref 11–26)
BUN: 14 mg/dL (ref 8–27)
Bilirubin Total: 0.3 mg/dL (ref 0.0–1.2)
CO2: 25 mmol/L (ref 18–29)
Calcium: 9.4 mg/dL (ref 8.7–10.3)
Chloride: 103 mmol/L (ref 97–108)
Creatinine, Ser: 0.65 mg/dL (ref 0.57–1.00)
GFR calc Af Amer: 109 mL/min/{1.73_m2} (ref >59)
GFR calc non Af Amer: 95 mL/min/{1.73_m2} (ref >59)
Globulin, Total: 2.6 g/dL (ref 1.5–4.5)
Glucose: 92 mg/dL (ref 65–99)
Potassium: 4.7 mmol/L (ref 3.5–5.2)
Sodium: 141 mmol/L (ref 134–144)
Total Protein: 7.1 g/dL (ref 6.0–8.5)

## 2014-05-22 LAB — NMR, LIPOPROFILE
Cholesterol: 139 mg/dL (ref 100–199)
HDL Cholesterol by NMR: 62 mg/dL (ref >39)
HDL Particle Number: 40.8 umol/L (ref >=30.5)
LDL Particle Number: 810 nmol/L (ref <1000)
LDL Size: 20.5 nm (ref >20.5)
LDL-C: 56 mg/dL (ref 0–99)
LP-IR Score: 47 — ABNORMAL HIGH (ref <=45)
Small LDL Particle Number: 449 nmol/L (ref <=527)
Triglycerides by NMR: 103 mg/dL (ref 0–149)

## 2014-05-27 ENCOUNTER — Ambulatory Visit
Admission: RE | Admit: 2014-05-27 | Discharge: 2014-05-27 | Disposition: A | Discharge status: Home / Self Care | Payer: Medicare Other | Source: Ambulatory Visit | Attending: Nurse Practitioner | Admitting: Nurse Practitioner

## 2014-05-27 DIAGNOSIS — Z1231 Encounter for screening mammogram for malignant neoplasm of breast: Secondary | ICD-10-CM

## 2014-07-07 DIAGNOSIS — H40033 Anatomical narrow angle, bilateral: Secondary | ICD-10-CM | POA: Diagnosis not present

## 2014-07-07 DIAGNOSIS — H2513 Age-related nuclear cataract, bilateral: Secondary | ICD-10-CM | POA: Diagnosis not present

## 2014-11-03 ENCOUNTER — Encounter: Payer: Self-pay | Admitting: Family Medicine

## 2014-11-03 ENCOUNTER — Ambulatory Visit (INDEPENDENT_AMBULATORY_CARE_PROVIDER_SITE_OTHER): Payer: Medicare Other | Admitting: Family Medicine

## 2014-11-03 VITALS — BP 123/69 | HR 64 | Temp 98.3°F | Ht 64.0 in | Wt 151.4 lb

## 2014-11-03 DIAGNOSIS — M7731 Calcaneal spur, right foot: Secondary | ICD-10-CM

## 2014-11-03 DIAGNOSIS — Z0000 Encounter for general adult medical examination without abnormal findings: Secondary | ICD-10-CM

## 2014-11-03 DIAGNOSIS — F32A Depression, unspecified: Secondary | ICD-10-CM

## 2014-11-03 DIAGNOSIS — G43919 Migraine, unspecified, intractable, without status migrainosus: Secondary | ICD-10-CM

## 2014-11-03 DIAGNOSIS — N951 Menopausal and female climacteric states: Secondary | ICD-10-CM

## 2014-11-03 DIAGNOSIS — K219 Gastro-esophageal reflux disease without esophagitis: Secondary | ICD-10-CM

## 2014-11-03 DIAGNOSIS — G4700 Insomnia, unspecified: Secondary | ICD-10-CM

## 2014-11-03 DIAGNOSIS — E785 Hyperlipidemia, unspecified: Secondary | ICD-10-CM

## 2014-11-03 DIAGNOSIS — F329 Major depressive disorder, single episode, unspecified: Secondary | ICD-10-CM

## 2014-11-03 MED ORDER — DICLOFENAC POTASSIUM 50 MG PO TABS: 50.0000 mg | ORAL_TABLET | Freq: Three times a day (TID) | ORAL | Status: DC

## 2014-11-03 MED ORDER — DULOXETINE HCL 60 MG PO CPEP: 60.0000 mg | ORAL_CAPSULE | Freq: Two times a day (BID) | ORAL | Status: DC

## 2014-11-03 MED ORDER — ESOMEPRAZOLE MAGNESIUM 40 MG PO CPDR: 40.0000 mg | DELAYED_RELEASE_CAPSULE | Freq: Every day | ORAL | Status: DC

## 2014-11-03 MED ORDER — METAXALONE 800 MG PO TABS: 800.0000 mg | ORAL_TABLET | Freq: Three times a day (TID) | ORAL | Status: DC

## 2014-11-03 MED ORDER — PROMETHAZINE HCL 25 MG PO TABS: 25.0000 mg | ORAL_TABLET | Freq: Four times a day (QID) | ORAL | Status: DC | PRN

## 2014-11-03 MED ORDER — RIZATRIPTAN BENZOATE 10 MG PO TBDP: 10.0000 mg | ORAL_TABLET | ORAL | Status: DC | PRN

## 2014-11-03 MED ORDER — ZOLPIDEM TARTRATE 10 MG PO TABS: 10.0000 mg | ORAL_TABLET | Freq: Every evening | ORAL | Status: DC | PRN

## 2014-11-03 MED ORDER — TOPIRAMATE 100 MG PO TABS: 100.0000 mg | ORAL_TABLET | Freq: Every day | ORAL | Status: DC

## 2014-11-03 MED ORDER — ROSUVASTATIN CALCIUM 10 MG PO TABS: 10.0000 mg | ORAL_TABLET | Freq: Every day | ORAL | Status: DC

## 2014-11-03 MED ORDER — ESTRADIOL 1 MG PO TABS: 1.0000 mg | ORAL_TABLET | Freq: Every day | ORAL | Status: DC

## 2014-11-03 NOTE — Progress Notes (Signed)
Subjective:   Selena Barnes is a 64 y.o. female who presents for Medicare Annual (Subsequent) preventive examination.  Review of Systems:  Review of Systems  Constitutional: Negative for fever, chills and malaise/fatigue.  HENT: Negative for congestion, ear discharge and ear pain.   Eyes: Negative for blurred vision, pain, discharge and redness.  Respiratory: Negative for cough, shortness of breath and wheezing.   Cardiovascular: Negative for chest pain, palpitations and leg swelling.  Gastrointestinal: Positive for heartburn (mostly controlled on Nexium). Negative for nausea, vomiting, diarrhea, constipation, blood in stool and melena.  Genitourinary: Negative for dysuria and hematuria.  Musculoskeletal: Negative for myalgias and back pain.  Skin: Negative for itching and rash.  Neurological: Positive for headaches (improved on current medications). Negative for dizziness, tingling, focal weakness and weakness.  Psychiatric/Behavioral: Negative for depression and suicidal ideas.           Objective:     Vitals: BP 123/69 mmHg  Pulse 64  Temp(Src) 98.3 F (36.8 C) (Oral)  Ht 5\' 4"  (1.626 m)  Wt 151 lb 6.4 oz (68.675 kg)  BMI 25.98 kg/m2  Tobacco History  Smoking status  . Never Smoker   Smokeless tobacco  . Not on file     Counseling given: Not Answered   Past Medical History  Diagnosis Date  . Migraines   . Hyperlipidemia   . GERD (gastroesophageal reflux disease)    Past Surgical History  Procedure Laterality Date  . Abdominal hysterectomy    . Cholecystectomy     Family History  Problem Relation Age of Onset  . Heart disease Mother   . Heart disease Father    History  Sexual Activity  . Sexual Activity: Not on file    Outpatient Encounter Prescriptions as of 11/03/2014  Medication Sig  . aspirin 81 MG tablet Take 81 mg by mouth daily.  . Calcium Carbonate-Vitamin D (CALTRATE 600+D PO) Take by mouth.  . Cholecalciferol (VITAMIN D3) 2000 UNITS  capsule Take 2,000 Units by mouth 2 (two) times daily.  . diclofenac (CATAFLAM) 50 MG tablet Take 1 tablet (50 mg total) by mouth 3 (three) times daily.  . DULoxetine (CYMBALTA) 60 MG capsule Take 1 capsule (60 mg total) by mouth daily. (Patient taking differently: Take 60 mg by mouth 2 (two) times daily. )  . esomeprazole (NEXIUM) 40 MG capsule Take 1 capsule (40 mg total) by mouth daily at 12 noon.  Marland Kitchen estradiol (ESTRACE) 1 MG tablet Take 1 tablet (1 mg total) by mouth daily.  . fish oil-omega-3 fatty acids 1000 MG capsule Take 1 g by mouth daily. Take 4 pills qd  . metaxalone (SKELAXIN) 800 MG tablet Take 1 tablet (800 mg total) by mouth 3 (three) times daily.  . promethazine (PHENERGAN) 25 MG tablet Take 1 tablet (25 mg total) by mouth every 6 (six) hours as needed for nausea or vomiting.  . rizatriptan (MAXALT-MLT) 10 MG disintegrating tablet Take 1 tablet (10 mg total) by mouth as needed for migraine. May repeat in 2 hours if needed  . rosuvastatin (CRESTOR) 10 MG tablet Take 1 tablet (10 mg total) by mouth daily.  Marland Kitchen topiramate (TOPAMAX) 100 MG tablet Take 1 tablet (100 mg total) by mouth daily.  Marland Kitchen zolpidem (AMBIEN) 10 MG tablet Take 1 tablet (10 mg total) by mouth at bedtime as needed for sleep.   No facility-administered encounter medications on file as of 11/03/2014.    Activities of Daily Living In your present state of  health, do you have any difficulty performing the following activities: 11/03/2014 01/22/2014  Hearing? N N  Vision? N N  Difficulty concentrating or making decisions? N N  Walking or climbing stairs? N N  Dressing or bathing? N N  Doing errands, shopping? N N  Preparing Food and eating ? N -  Using the Toilet? N -  In the past six months, have you accidently leaked urine? N -  Do you have problems with loss of bowel control? N -  Managing your Medications? N -  Managing your Finances? N -  Housekeeping or managing your Housekeeping? N -    Patient Care  Team: Chevis Pretty, FNP as PCP - General (Nurse Practitioner)    Assessment:    Problem List Items Addressed This Visit      Cardiovascular and Mediastinum   Migraines   Relevant Medications   diclofenac (CATAFLAM) 50 MG tablet   DULoxetine (CYMBALTA) 60 MG capsule   metaxalone (SKELAXIN) 800 MG tablet   rizatriptan (MAXALT-MLT) 10 MG disintegrating tablet   rosuvastatin (CRESTOR) 10 MG tablet   topiramate (TOPAMAX) 100 MG tablet     Digestive   GERD (gastroesophageal reflux disease)   Relevant Medications   esomeprazole (NEXIUM) 40 MG capsule     Musculoskeletal and Integument   Heel spur   Relevant Medications   diclofenac (CATAFLAM) 50 MG tablet     Other   Insomnia   Relevant Medications   zolpidem (AMBIEN) 10 MG tablet   Depression   Relevant Medications   DULoxetine (CYMBALTA) 60 MG capsule   Hyperlipidemia with target LDL less than 100   Relevant Medications   rosuvastatin (CRESTOR) 10 MG tablet    Other Visit Diagnoses    Routine history and physical examination of adult    -  Primary    Menopausal flushing        Relevant Medications    estradiol (ESTRACE) 1 MG tablet      refill medications mostly along with Medicare wellness visit.  Exercise Activities and Dietary recommendations Current Exercise Habits:: Structured exercise class, Type of exercise: walking;treadmill;stretching, Time (Minutes): 60, Frequency (Times/Week): 3, Weekly Exercise (Minutes/Week): 180, Intensity: Moderate  Goals    None     Fall Risk Fall Risk  11/03/2014 05/21/2014 02/03/2014 01/22/2014 06/20/2013  Falls in the past year? Yes No No No No  Number falls in past yr: 1 - - - -  Injury with Fall? No - - - -   Depression Screen PHQ 2/9 Scores 11/03/2014 05/21/2014 02/03/2014 01/22/2014  PHQ - 2 Score 0 0 0 0     Cognitive Testing MMSE - Mini Mental State Exam 11/03/2014  Orientation to time 5  Orientation to Place 5  Registration 3  Attention/ Calculation 5  Recall 3   Language- name 2 objects 2  Language- repeat 1  Language- follow 3 step command 3  Language- read & follow direction 1  Write a sentence 1  Copy design 1  Total score 30    Immunization History  Administered Date(s) Administered  . Influenza,inj,Quad PF,36+ Mos 12/20/2012, 01/22/2014  . Zoster 06/20/2013   Screening Tests Health Maintenance  Topic Date Due  . INFLUENZA VACCINE  12/03/2014 (Originally 09/14/2014)  . Hepatitis C Screening  11/03/2015 (Originally 11/23/1950)  . COLON CANCER SCREENING ANNUAL FOBT  01/24/2015  . PAP SMEAR  12/21/2015  . TETANUS/TDAP  05/14/2016  . MAMMOGRAM  05/26/2016  . COLONOSCOPY  08/14/2018  . ZOSTAVAX  Completed  .  HIV Screening  Completed      Plan:   see assessment.   During the course of the visit the patient was educated and counseled about the following appropriate screening and preventive services:   Vaccines to include Pneumoccal, Influenza, Hepatitis B, Td, Zostavax, HCV  Electrocardiogram  Cardiovascular Disease  Colorectal cancer screening  Bone density screening  Diabetes screening  Glaucoma screening  Mammography/PAP  Nutrition counseling   Patient Instructions (the written plan) was given to the patient.   Worthy Rancher, MD  11/03/2014

## 2014-11-04 ENCOUNTER — Other Ambulatory Visit (INDEPENDENT_AMBULATORY_CARE_PROVIDER_SITE_OTHER): Payer: Medicare Other

## 2014-11-04 DIAGNOSIS — E785 Hyperlipidemia, unspecified: Secondary | ICD-10-CM

## 2014-11-05 LAB — LIPID PANEL
Chol/HDL Ratio: 2.3 ratio units (ref 0.0–4.4)
Cholesterol, Total: 145 mg/dL (ref 100–199)
HDL: 62 mg/dL (ref >39)
LDL Calculated: 67 mg/dL (ref 0–99)
Triglycerides: 82 mg/dL (ref 0–149)
VLDL Cholesterol Cal: 16 mg/dL (ref 5–40)

## 2014-11-13 ENCOUNTER — Ambulatory Visit (INDEPENDENT_AMBULATORY_CARE_PROVIDER_SITE_OTHER): Payer: Medicare Other

## 2014-11-13 DIAGNOSIS — Z23 Encounter for immunization: Secondary | ICD-10-CM

## 2014-12-11 ENCOUNTER — Encounter: Payer: Self-pay | Admitting: Family Medicine

## 2014-12-11 ENCOUNTER — Ambulatory Visit (INDEPENDENT_AMBULATORY_CARE_PROVIDER_SITE_OTHER): Payer: Medicare Other | Admitting: Family Medicine

## 2014-12-11 VITALS — BP 120/71 | HR 70 | Temp 97.6°F | Ht 64.0 in | Wt 151.2 lb

## 2014-12-11 DIAGNOSIS — M7631 Iliotibial band syndrome, right leg: Secondary | ICD-10-CM

## 2014-12-11 MED ORDER — METHYLPREDNISOLONE ACETATE 40 MG/ML IJ SUSP: 40.0000 mg | Freq: Once | INTRAMUSCULAR | Status: DC

## 2014-12-11 NOTE — Progress Notes (Signed)
BP 120/71 mmHg  Pulse 70  Temp(Src) 97.6 F (36.4 C) (Oral)  Ht 5\' 4"  (1.626 m)  Wt 151 lb 3.2 oz (68.584 kg)  BMI 25.94 kg/m2   Subjective:    Patient ID: Selena Barnes, female    DOB: 1950-07-08, 64 y.o.   MRN: [id number]  HPI: Selena Barnes is a 64 y.o. female presenting on 12/11/2014 for Foot Pain; Hip Pain; and Leg Pain   HPI Hip pain Patient has hip pain that starts on her right lateral hip and extends down to the lateral side of her knee. This has been going on since she walked on a treadmill 5 days ago. The pain is described as sharp and 6 out of 10. She did have heel pain before but that's resolved just today. She did not know if this was related to that.  Relevant past medical, surgical, family and social history reviewed and updated as indicated. Interim medical history since our last visit reviewed. Allergies and medications reviewed and updated.  Review of Systems  Constitutional: Negative for fever and chills.  HENT: Negative for congestion, ear discharge and ear pain.   Eyes: Negative for redness and visual disturbance.  Respiratory: Negative for chest tightness and shortness of breath.   Cardiovascular: Negative for chest pain and leg swelling.  Genitourinary: Negative for dysuria and difficulty urinating.  Musculoskeletal: Positive for myalgias. Negative for back pain, joint swelling and gait problem.  Skin: Negative for rash.  Neurological: Negative for light-headedness and headaches.  Psychiatric/Behavioral: Negative for behavioral problems and agitation.  All other systems reviewed and are negative.   Per HPI unless specifically indicated above     Medication List       This list is accurate as of: 12/11/14 10:45 AM.  Always use your most recent med list.               aspirin 81 MG tablet  Take 81 mg by mouth daily.     CALTRATE 600+D PO  Take by mouth.     diclofenac 50 MG tablet  Commonly known as:  CATAFLAM  Take 1 tablet (50 mg  total) by mouth 3 (three) times daily.     DULoxetine 60 MG capsule  Commonly known as:  CYMBALTA  Take 1 capsule (60 mg total) by mouth 2 (two) times daily.     esomeprazole 40 MG capsule  Commonly known as:  NEXIUM  Take 1 capsule (40 mg total) by mouth daily at 12 noon.     estradiol 1 MG tablet  Commonly known as:  ESTRACE  Take 1 tablet (1 mg total) by mouth daily.     fish oil-omega-3 fatty acids 1000 MG capsule  Take 1 g by mouth daily. Take 4 pills qd     metaxalone 800 MG tablet  Commonly known as:  SKELAXIN  Take 1 tablet (800 mg total) by mouth 3 (three) times daily.     promethazine 25 MG tablet  Commonly known as:  PHENERGAN  Take 1 tablet (25 mg total) by mouth every 6 (six) hours as needed for nausea or vomiting.     rizatriptan 10 MG disintegrating tablet  Commonly known as:  MAXALT-MLT  Take 1 tablet (10 mg total) by mouth as needed for migraine. May repeat in 2 hours if needed     rosuvastatin 10 MG tablet  Commonly known as:  CRESTOR  Take 1 tablet (10 mg total) by mouth daily.  topiramate 100 MG tablet  Commonly known as:  TOPAMAX  Take 1 tablet (100 mg total) by mouth daily.     Vitamin D3 2000 UNITS capsule  Take 2,000 Units by mouth 2 (two) times daily.     zolpidem 10 MG tablet  Commonly known as:  AMBIEN  Take 1 tablet (10 mg total) by mouth at bedtime as needed for sleep.           Objective:    BP 120/71 mmHg  Pulse 70  Temp(Src) 97.6 F (36.4 C) (Oral)  Ht 5\' 4"  (1.626 m)  Wt 151 lb 3.2 oz (68.584 kg)  BMI 25.94 kg/m2  Wt Readings from Last 3 Encounters:  12/11/14 151 lb 3.2 oz (68.584 kg)  11/03/14 151 lb 6.4 oz (68.675 kg)  05/21/14 153 lb (69.4 kg)    Physical Exam  Constitutional: She is oriented to person, place, and time. She appears well-developed and well-nourished. No distress.  Eyes: Conjunctivae and EOM are normal. Pupils are equal, round, and reactive to light.  Cardiovascular: Normal rate and regular rhythm.    No murmur heard. Pulmonary/Chest: Effort normal and breath sounds normal. No respiratory distress. She has no wheezes.  Musculoskeletal: Normal range of motion. She exhibits no edema or tenderness.       Left hip: She exhibits normal range of motion and normal strength.       Legs: Neurological: She is alert and oriented to person, place, and time. Coordination normal.  Skin: Skin is warm and dry. No rash noted. She is not diaphoretic.  Psychiatric: She has a normal mood and affect. Her behavior is normal.  Vitals reviewed.   Results for orders placed or performed in visit on 11/04/14  Lipid panel  Result Value Ref Range   Cholesterol, Total 145 100 - 199 mg/dL   Triglycerides 82 0 - 149 mg/dL   HDL 62 >39 mg/dL   VLDL Cholesterol Cal 16 5 - 40 mg/dL   LDL Calculated 67 0 - 99 mg/dL   Chol/HDL Ratio 2.3 0.0 - 4.4 ratio units   IT band injection: Risk factors of bleeding and infection discussed with patient and patient is agreeable towards injection. Patient prepped with Betadine. Injected at most tender poin. Injected 40 mg of Depo-Medrol and 1 mL of 2% lidocaine. Patient tolerated procedure well and no side effects from noted. Minimal to no bleeding. Simple bandage applied after.     Assessment & Plan:   Problem List Items Addressed This Visit    None    Visit Diagnoses    IT band syndrome, right    -  Primary    She has taken diclofenac without success, will do Depo injection and gave instructions on how to stretch and exercise the muscle groups to prevent in future    Relevant Medications    methylPREDNISolone acetate (DEPO-MEDROL) injection 40 mg        Follow up plan: Return if symptoms worsen or fail to improve.  Caryl Pina, MD [facility] 12/11/2014, 10:45 AM

## 2014-12-17 ENCOUNTER — Other Ambulatory Visit: Payer: Self-pay

## 2014-12-17 DIAGNOSIS — M7630 Iliotibial band syndrome, unspecified leg: Secondary | ICD-10-CM

## 2014-12-21 ENCOUNTER — Ambulatory Visit: Payer: Medicare Other | Attending: Family Medicine | Admitting: Physical Therapy

## 2014-12-21 DIAGNOSIS — M25551 Pain in right hip: Secondary | ICD-10-CM | POA: Diagnosis not present

## 2014-12-21 DIAGNOSIS — R29898 Other symptoms and signs involving the musculoskeletal system: Secondary | ICD-10-CM | POA: Diagnosis not present

## 2014-12-21 NOTE — Therapy (Signed)
[facility] [place], [name], [address] Phone: [phone]   Fax:  [phone]  Physical Therapy Evaluation  Patient Details  Name: Selena Barnes MRN: [id number] Date of Birth: [birth year] Referring Provider: Vonna Kotyk Dettinger MD.  Encounter Date: 12/21/2014      PT End of Session - 12/21/14 1132    Visit Number 1   Number of Visits 12   Date for PT Re-Evaluation 02/01/15   PT Start Time 0948   PT Stop Time 1037   PT Time Calculation (min) 49 min   Activity Tolerance Patient tolerated treatment well   Behavior During Therapy [facility] for tasks assessed/performed      Past Medical History  Diagnosis Date  . Migraines   . Hyperlipidemia   . GERD (gastroesophageal reflux disease)     Past Surgical History  Procedure Laterality Date  . Abdominal hysterectomy    . Cholecystectomy      There were no vitals filed for this visit.  Visit Diagnosis:  Right hip pain - Plan: PT plan of care cert/re-cert  Weakness of right hip - Plan: PT plan of care cert/re-cert      Subjective Assessment - 12/21/14 1059    Subjective I cant sleep on my right side.   Patient Stated Goals Get out of pain.            [facility] PT Assessment - 12/21/14 0001    Assessment   Medical Diagnosis IT band syndrome.   Referring Provider Caryl Pina MD.   Onset Date/Surgical Date --  April 2016.   Precautions   Precautions None   Restrictions   Weight Bearing Restrictions No   Balance Screen   Has the patient fallen in the past 6 months No   Has the patient had a decrease in activity level because of a fear of falling?  No   Is the patient reluctant to leave their home because of a fear of falling?  No   Home Ecologist residence   Prior Function   Level of Independence Independent   Posture/Postural Control   Posture/Postural Control No significant limitations   ROM / Strength   AROM / PROM /  Strength AROM;Strength   AROM   Overall AROM Comments WNL for right hip AROM.   Strength   Overall Strength Comments Right hip abduction= 4-/5 at least in part limited by pain.   Palpation   Palpation comment Very tender to palpation around patient's right greater trochanter especially posteriorly.  There is an ecchymotic area due to the recent injection.   Ambulation/Gait   Gait Comments Mild gait antalgia.                   [facility] Adult PT Treatment/Exercise - 12/21/14 0001    Modalities   Modalities Electrical Stimulation;Moist Heat   Moist Heat Therapy   Number Minutes Moist Heat 20 Minutes   Moist Heat Location --  Right hip.   Acupuncturist Location --  Right hip.   Electrical Stimulation Action --  80-150 HZ at 100% scan x 20 minutes to pts. rt hip.   Electrical Stimulation Goals Pain                  PT Short Term Goals - 12/21/14 1131    PT SHORT TERM GOAL #1   Title Ind with HEP.   Time 2   Period Weeks  Status New           PT Long Term Goals - December 27, 2014 1131    PT LONG TERM GOAL #1   Title Perform ADL's with pain not > 2-3/10.   Time 6   Period Weeks   Status New   PT LONG TERM GOAL #2   Title Sit 30 minutes with pain not > 3/10.   Time 6   Period Weeks   Status New   PT LONG TERM GOAL #3   Title Drive 30 minutes with pain not > 3/10.   Time 6   Period Weeks               Plan - 12/27/2014 1059    Clinical Impression Statement The patient has had a long h/o right hip pain with a recent sever flare-up in April 2016.  She had a recent injection in her right hip and is very sore.  Se is unable to lie on her right hip.  Her resting pain-level is a 4/10 but exercising; prolonged sitting and driving increases her pain to higher levels (7+/10).   Pt will benefit from skilled therapeutic intervention in order to improve on the following deficits Pain;Decreased activity tolerance   Rehab  Potential Excellent   PT Frequency 2x / week   PT Duration 6 weeks   PT Treatment/Interventions ADLs/Self Care Home Management;Cryotherapy;Electrical Stimulation;Therapeutic activities;Therapeutic exercise;Patient/family education;Manual techniques   PT Next Visit Plan Combo E'stim/U/S to patient's right hip; E'stim and eventually STW when patient can tolerate it.  Sent order for [place].          G-Codes - 27-Dec-2014 1129    Functional Assessment Tool Used FOTO.   Functional Limitation Mobility: Walking and moving around   Mobility: Walking and Moving Around Current Status [phone]) At least 40 percent but less than 60 percent impaired, limited or restricted   Mobility: Walking and Moving Around Goal Status [phone]) At least 20 percent but less than 40 percent impaired, limited or restricted       Problem List Patient Active Problem List   Diagnosis Date Noted  . BMI 26.0-26.9,adult 05/21/2014  . Heel spur 05/21/2014  . Migraines December 26, 2012  . Insomnia 12/26/2012  . Depression 2012-12-26  . GERD (gastroesophageal reflux disease) Dec 26, 2012  . Hyperlipidemia with target LDL less than 100 2012/12/26    APPLEGATE, [place] MPT 12/27/14, 11:44 AM  [facility] [place], [name], [address] Phone: [phone]   Fax:  [phone]  Name: Selena Barnes MRN: [id number] Date of Birth: [birth year]

## 2014-12-24 ENCOUNTER — Ambulatory Visit: Payer: Medicare Other | Admitting: Physical Therapy

## 2014-12-24 ENCOUNTER — Encounter: Payer: Self-pay | Admitting: Physical Therapy

## 2014-12-24 DIAGNOSIS — M25551 Pain in right hip: Secondary | ICD-10-CM | POA: Diagnosis not present

## 2014-12-24 DIAGNOSIS — R29898 Other symptoms and signs involving the musculoskeletal system: Secondary | ICD-10-CM

## 2014-12-24 NOTE — Therapy (Signed)
[facility] [place], [name], [address] Phone: [phone]   Fax:  [phone]  Physical Therapy Treatment  Patient Details  Name: Selena Barnes MRN: [id number] Date of Birth: 1950-09-16 Referring Provider: Vonna Kotyk Dettinger MD.  Encounter Date: 12/24/2014      PT End of Session - 12/24/14 0906    Visit Number 2   Number of Visits 12   Date for PT Re-Evaluation 02/01/15   PT Start Time 0814   PT Stop Time 0901   PT Time Calculation (min) 47 min   Activity Tolerance Patient tolerated treatment well   Behavior During Therapy [facility] for tasks assessed/performed      Past Medical History  Diagnosis Date  . Migraines   . Hyperlipidemia   . GERD (gastroesophageal reflux disease)     Past Surgical History  Procedure Laterality Date  . Abdominal hysterectomy    . Cholecystectomy      There were no vitals filed for this visit.  Visit Diagnosis:  Right hip pain  Weakness of right hip      Subjective Assessment - 12/24/14 0813    Subjective Reports a little soreness in L hip today.   Patient Stated Goals Get out of pain.   Currently in Pain? Yes   Pain Score 2    Pain Location Hip   Pain Orientation Right   Pain Descriptors / Indicators Sore            [facility] PT Assessment - 12/24/14 0001    Assessment   Medical Diagnosis IT band syndrome.                     [place] Adult PT Treatment/Exercise - 12/24/14 0001    Modalities   Modalities Electrical Stimulation;Moist Heat;Ultrasound   Moist Heat Therapy   Number Minutes Moist Heat 15 Minutes   Moist Heat Location Hip   Electrical Stimulation   Electrical Stimulation Location R ITB   Electrical Stimulation Action Pre-Mod  B channels   Electrical Stimulation Parameters 80-150 Hz x15 min   Electrical Stimulation Goals Pain;Tone   Ultrasound   Ultrasound Location R posteriolateral hip   Ultrasound Parameters 1.5 w/cm2, 100%, 1 mhz COMBO x10  min   Ultrasound Goals Pain;Other (Comment)  Tone   Manual Therapy   Manual Therapy Myofascial release   Myofascial Release Gentle MFR/TPR to R ITB and                   PT Short Term Goals - 12/21/14 1131    PT SHORT TERM GOAL #1   Title Ind with HEP.   Time 2   Period Weeks   Status New           PT Long Term Goals - 12/21/14 1131    PT LONG TERM GOAL #1   Title Perform ADL's with pain not > 2-3/10.   Time 6   Period Weeks   Status New   PT LONG TERM GOAL #2   Title Sit 30 minutes with pain not > 3/10.   Time 6   Period Weeks   Status New   PT LONG TERM GOAL #3   Title Drive 30 minutes with pain not > 3/10.   Time 6   Period Weeks               Plan - 12/24/14 [id number]    Clinical Impression Statement Patient tolerated today's treatment fairly well although she  continues to have soreness and tenderness with palpation along the R ITB. Normal modalities response noted following removal of the modalities. Continues to present with tightness along the entire R ITB and TPR throughout especially in inferior R ITB. Erythema present following gentle MFR/TPR of the R ITB and continues to have mild bruising still at the point of injection from Oct. 2016 in R hip. Patient continued to have R hip and ITB tightness following today's treatment.   Pt will benefit from skilled therapeutic intervention in order to improve on the following deficits Pain;Decreased activity tolerance   Rehab Potential Excellent   PT Frequency 2x / week   PT Duration 6 weeks   PT Treatment/Interventions ADLs/Self Care Home Management;Cryotherapy;Electrical Stimulation;Therapeutic activities;Therapeutic exercise;Patient/family education;Manual techniques   PT Next Visit Plan Continue per MPT POC.   Consulted and Agree with Plan of Care Patient        Problem List Patient Active Problem List   Diagnosis Date Noted  . BMI 26.0-26.9,adult 05/21/2014  . Heel spur 05/21/2014  . Migraines  12/20/2012  . Insomnia 12/20/2012  . Depression 12/20/2012  . GERD (gastroesophageal reflux disease) 12/20/2012  . Hyperlipidemia with target LDL less than 100 12/20/2012    [name], PTA 12/24/2014, 9:07 AM  [facility] [address] [place], [name], [address] Phone: [phone]   Fax:  [phone]  Name: Selena Barnes MRN: [id number] Date of Birth: May 06, 1950

## 2014-12-28 ENCOUNTER — Ambulatory Visit: Payer: Medicare Other | Admitting: Physical Therapy

## 2014-12-28 ENCOUNTER — Encounter: Payer: Self-pay | Admitting: Physical Therapy

## 2014-12-28 DIAGNOSIS — M25551 Pain in right hip: Secondary | ICD-10-CM

## 2014-12-28 DIAGNOSIS — R29898 Other symptoms and signs involving the musculoskeletal system: Secondary | ICD-10-CM | POA: Diagnosis not present

## 2014-12-28 NOTE — Therapy (Signed)
[facility] [place], [name], [address] Phone: [phone]   Fax:  [phone]  Physical Therapy Treatment  Patient Details  Name: Selena Barnes MRN: [id number] Date of Birth: 11/16/1950 Referring Provider: Vonna Kotyk Dettinger MD.  Encounter Date: 12/28/2014      PT End of Session - 12/28/14 1432    Visit Number 3   Number of Visits 12   Date for PT Re-Evaluation 02/01/15   PT Start Time 1435   PT Stop Time 1517   PT Time Calculation (min) 42 min   Activity Tolerance Patient tolerated treatment well   Behavior During Therapy [facility] for tasks assessed/performed      Past Medical History  Diagnosis Date  . Migraines   . Hyperlipidemia   . GERD (gastroesophageal reflux disease)     Past Surgical History  Procedure Laterality Date  . Abdominal hysterectomy    . Cholecystectomy      There were no vitals filed for this visit.  Visit Diagnosis:  Right hip pain  Weakness of right hip      Subjective Assessment - 12/28/14 1432    Subjective Worse with sitting and exericses such as hip abduction that was given in an HEP by MD. Reports HEP compliance with MD given HEP.   Patient Stated Goals Get out of pain.   Currently in Pain? Yes   Pain Score 2    Pain Location Hip   Pain Orientation Right   Pain Descriptors / Indicators Tightness;Other (Comment)  "solid pain all the time."            [facility] PT Assessment - 12/28/14 0001    Assessment   Medical Diagnosis IT band syndrome.                     [facility] Adult PT Treatment/Exercise - 12/28/14 0001    Modalities   Modalities Electrical Stimulation;Moist Heat;Ultrasound   Moist Heat Therapy   Number Minutes Moist Heat 15 Minutes   Moist Heat Location Hip   Electrical Stimulation   Electrical Stimulation Location R ITB   Electrical Stimulation Action Pre-Mod  2 channels   Electrical Stimulation Parameters 80-150 Hz x15 min   Electrical  Stimulation Goals Pain;Tone   Ultrasound   Ultrasound Location R posteriolateral hip   Ultrasound Parameters 1.5 w/cm2, 100%, 1 mhz x10 min   Ultrasound Goals Pain;Other (Comment)  Tone   Manual Therapy   Manual Therapy Myofascial release   Myofascial Release Gentle MFR/TPR/ IASTW to R ITB to decrease tightness, pain in L sidelying                  PT Short Term Goals - 12/21/14 1131    PT SHORT TERM GOAL #1   Title Ind with HEP.   Time 2   Period Weeks   Status New           PT Long Term Goals - 12/21/14 1131    PT LONG TERM GOAL #1   Title Perform ADL's with pain not > 2-3/10.   Time 6   Period Weeks   Status New   PT LONG TERM GOAL #2   Title Sit 30 minutes with pain not > 3/10.   Time 6   Period Weeks   Status New   PT LONG TERM GOAL #3   Title Drive 30 minutes with pain not > 3/10.   Time 6   Period Weeks  Plan - 12/28/14 1624    Clinical Impression Statement Patient's tolerated today's treatment well although she continues to have tenderness and soreness in the R hip and ITB region. Normal modalities response noted following removal of the modalities. Continues to present with tightness and TPs along the entire R ITB especially mid to inferior region but appears to be decreased from previous treatment. MFR/TPR was utilized with minimal to slightly moderate pressure. Gentle IASTW was initiatied today in efforts of decreasing TPs and tightness noted throughout the R ITB. Petiche was observed mostly in the mid to inferior region of the R ITB. Bruising around the injection site of the R hip is decreasing in discoloration with mostly yellow present today. Patient denied R hip and ITB soreness or pain following today's treatment and was educated that she may experience sorenss in the R ITB following IASTW.   Pt will benefit from skilled therapeutic intervention in order to improve on the following deficits Pain;Decreased activity tolerance    Rehab Potential Excellent   PT Frequency 2x / week   PT Duration 6 weeks   PT Treatment/Interventions ADLs/Self Care Home Management;Cryotherapy;Electrical Stimulation;Therapeutic activities;Therapeutic exercise;Patient/family education;Manual techniques   PT Next Visit Plan Continue per MPT POC.   Consulted and Agree with Plan of Care Patient        Problem List Patient Active Problem List   Diagnosis Date Noted  . BMI 26.0-26.9,adult 05/21/2014  . Heel spur 05/21/2014  . Migraines 12/20/2012  . Insomnia 12/20/2012  . Depression 12/20/2012  . GERD (gastroesophageal reflux disease) 12/20/2012  . Hyperlipidemia with target LDL less than 100 12/20/2012    Selena Barnes, PTA 12/28/2014, 4:32 PM  [facility] [place], [name], [address] Phone: [phone]   Fax:  [phone]  Name: Selena Barnes MRN: [id number] Date of Birth: 30-Jan-1951

## 2015-01-01 ENCOUNTER — Encounter: Payer: Self-pay | Admitting: *Deleted

## 2015-01-01 ENCOUNTER — Ambulatory Visit: Payer: Medicare Other | Admitting: *Deleted

## 2015-01-01 DIAGNOSIS — M25551 Pain in right hip: Secondary | ICD-10-CM

## 2015-01-01 DIAGNOSIS — R29898 Other symptoms and signs involving the musculoskeletal system: Secondary | ICD-10-CM

## 2015-01-01 NOTE — Therapy (Signed)
[facility] [place], [name], [address] Phone: [phone]   Fax:  [phone]  Physical Therapy Treatment  Patient Details  Name: Selena Barnes MRN: [id number] Date of Birth: [birth year] Referring Provider: Vonna Kotyk Dettinger MD.  Encounter Date: 01/01/2015      PT End of Session - 01/01/15 0819    Visit Number 4   Number of Visits 12   Date for PT Re-Evaluation 02/01/15   PT Start Time 0815   PT Stop Time 0905   PT Time Calculation (min) 50 min      Past Medical History  Diagnosis Date  . Migraines   . Hyperlipidemia   . GERD (gastroesophageal reflux disease)     Past Surgical History  Procedure Laterality Date  . Abdominal hysterectomy    . Cholecystectomy      There were no vitals filed for this visit.  Visit Diagnosis:  Right hip pain  Weakness of right hip      Subjective Assessment - 01/01/15 0817    Subjective Worse with sitting and exericses such as hip abduction that was given in an HEP by MD. Reports HEP compliance with MD given HEP.   Patient Stated Goals Get out of pain.   Currently in Pain? Yes   Pain Score 2    Pain Location Hip   Pain Orientation Right   Aggravating Factors  sitting   Pain Relieving Factors rest, Rxs                         [facility] Adult PT Treatment/Exercise - 01/01/15 0001    Modalities   Modalities Electrical Stimulation;Moist Heat;Ultrasound   Moist Heat Therapy   Number Minutes Moist Heat 15 Minutes   Moist Heat Location Hip   Electrical Stimulation   Electrical Stimulation Location R ITB  Premod x 15 mins 2 ch 80-150hz  in LT sidelying   Electrical Stimulation Goals Pain   Ultrasound   Ultrasound Location RT HIP POST aspect    Ultrasound Parameters Combo 1.5 w/cm2 x 10 mins   Ultrasound Goals Pain   Manual Therapy   Manual Therapy Soft tissue mobilization;Myofascial release   Soft tissue mobilization ITB lifts and mobs to decrease  tightness    Myofascial Release Gentle MFR/TPR/ IASTW to R ITB to decrease tightness, pain in L sidelying                  PT Short Term Goals - 12/21/14 1131    PT SHORT TERM GOAL #1   Title Ind with HEP.   Time 2   Period Weeks   Status New           PT Long Term Goals - 12/21/14 1131    PT LONG TERM GOAL #1   Title Perform ADL's with pain not > 2-3/10.   Time 6   Period Weeks   Status New   PT LONG TERM GOAL #2   Title Sit 30 minutes with pain not > 3/10.   Time 6   Period Weeks   Status New   PT LONG TERM GOAL #3   Title Drive 30 minutes with pain not > 3/10.   Time 6   Period Weeks               Plan - 01/01/15 0913    Clinical Impression Statement Pt did well today with Rx, but continues to have tightness and soreness at RT hip  and along ITB. She did well with manual techniques and had decreased tightness after. Goals are on-going due to pain levels.   Pt will benefit from skilled therapeutic intervention in order to improve on the following deficits Pain;Decreased activity tolerance   Rehab Potential Excellent   PT Frequency 2x / week   PT Duration 6 weeks   PT Treatment/Interventions ADLs/Self Care Home Management;Cryotherapy;Electrical Stimulation;Therapeutic activities;Therapeutic exercise;Patient/family education;Manual techniques   PT Next Visit Plan Continue per MPT POC.   Consulted and Agree with Plan of Care Patient        Problem List Patient Active Problem List   Diagnosis Date Noted  . BMI 26.0-26.9,adult 05/21/2014  . Heel spur 05/21/2014  . Migraines 12/20/2012  . Insomnia 12/20/2012  . Depression 12/20/2012  . GERD (gastroesophageal reflux disease) 12/20/2012  . Hyperlipidemia with target LDL less than 100 12/20/2012    RAMSEUR,CHRIS, PTA 01/01/2015, 9:17 AM  [facility] [place], [name], [address] Phone: [phone]   Fax:  [phone]  Name: Selena Barnes MRN: [id number] Date of Birth: 01/18/1951

## 2015-01-04 ENCOUNTER — Encounter: Payer: Self-pay | Admitting: *Deleted

## 2015-01-04 ENCOUNTER — Ambulatory Visit: Payer: Medicare Other | Admitting: *Deleted

## 2015-01-04 DIAGNOSIS — M25551 Pain in right hip: Secondary | ICD-10-CM | POA: Diagnosis not present

## 2015-01-04 DIAGNOSIS — R29898 Other symptoms and signs involving the musculoskeletal system: Secondary | ICD-10-CM | POA: Diagnosis not present

## 2015-01-04 NOTE — Therapy (Signed)
[facility] [place], [name], [address] Phone: [phone]   Fax:  [phone]  Physical Therapy Treatment  Patient Details  Name: Selena Barnes MRN: [id number] Date of Birth: 08-Sep-1950 Referring Provider: Vonna Kotyk Dettinger MD.  Encounter Date: 01/04/2015      PT End of Session - 01/04/15 0859    Visit Number 5   Number of Visits 12   Date for PT Re-Evaluation 02/01/15   PT Start Time 0815   PT Stop Time 0906   PT Time Calculation (min) 51 min      Past Medical History  Diagnosis Date  . Migraines   . Hyperlipidemia   . GERD (gastroesophageal reflux disease)     Past Surgical History  Procedure Laterality Date  . Abdominal hysterectomy    . Cholecystectomy      There were no vitals filed for this visit.  Visit Diagnosis:  Right hip pain  Weakness of right hip      Subjective Assessment - 01/04/15 1112    Subjective Worse with sitting and exericses such as hip abduction that was given in an HEP by MD. Reports HEP compliance with MD given HEP.   Patient Stated Goals Get out of pain.   Currently in Pain? Yes   Pain Score 3    Pain Location Hip   Pain Orientation Right   Pain Descriptors / Indicators Aching;Tightness   Aggravating Factors  sitting   Pain Relieving Factors rest, Rxs                         [facility] Adult PT Treatment/Exercise - 01/04/15 0001    Modalities   Modalities Electrical Stimulation;Moist Heat;Ultrasound   Moist Heat Therapy   Number Minutes Moist Heat 15 Minutes   Moist Heat Location Hip   Electrical Stimulation   Electrical Stimulation Location R ITB  Premod x 15 mins 2 ch 80-150hz  in LT sidelying   Electrical Stimulation Goals Pain   Ultrasound   Ultrasound Location RT hip and ITB   Ultrasound Parameters COMBO 1.5 w/cm2 x 10 mins   Ultrasound Goals Pain   Manual Therapy   Manual Therapy Soft tissue mobilization;Myofascial release   Soft tissue  mobilization ITB lifts and mobs to decrease tightness    Myofascial Release Gentle MFR/TPR/ IASTW to R ITB to decrease tightness, pain in L sidelying                  PT Short Term Goals - 12/21/14 1131    PT SHORT TERM GOAL #1   Title Ind with HEP.   Time 2   Period Weeks   Status New           PT Long Term Goals - 12/21/14 1131    PT LONG TERM GOAL #1   Title Perform ADL's with pain not > 2-3/10.   Time 6   Period Weeks   Status New   PT LONG TERM GOAL #2   Title Sit 30 minutes with pain not > 3/10.   Time 6   Period Weeks   Status New   PT LONG TERM GOAL #3   Title Drive 30 minutes with pain not > 3/10.   Time 6   Period Weeks               Plan - 01/04/15 0900    Clinical Impression Statement Pt did better today with Rx and had less tightness  along ITB. She was still very sore at hip ad along ITB and distal insertion. Goals are ongoing   Pt will benefit from skilled therapeutic intervention in order to improve on the following deficits Pain;Decreased activity tolerance   Rehab Potential Excellent   PT Frequency 2x / week   PT Duration 6 weeks   PT Treatment/Interventions ADLs/Self Care Home Management;Cryotherapy;Electrical Stimulation;Therapeutic activities;Therapeutic exercise;Patient/family education;Manual techniques   PT Next Visit Plan Continue per MPT POC.   Consulted and Agree with Plan of Care Patient        Problem List Patient Active Problem List   Diagnosis Date Noted  . BMI 26.0-26.9,adult 05/21/2014  . Heel spur 05/21/2014  . Migraines 12/20/2012  . Insomnia 12/20/2012  . Depression 12/20/2012  . GERD (gastroesophageal reflux disease) 12/20/2012  . Hyperlipidemia with target LDL less than 100 12/20/2012    RAMSEUR,CHRIS, PTA 01/04/2015, 11:23 AM  [facility] [place], [name], [address] Phone: [phone]   Fax:  [phone]  Name: Selena Barnes MRN:  [id number] Date of Birth: 1950-11-28

## 2015-01-11 ENCOUNTER — Encounter: Payer: Self-pay | Admitting: Physical Therapy

## 2015-01-11 ENCOUNTER — Ambulatory Visit: Payer: Medicare Other | Admitting: Physical Therapy

## 2015-01-11 DIAGNOSIS — M25551 Pain in right hip: Secondary | ICD-10-CM | POA: Diagnosis not present

## 2015-01-11 DIAGNOSIS — R29898 Other symptoms and signs involving the musculoskeletal system: Secondary | ICD-10-CM

## 2015-01-11 NOTE — Patient Instructions (Signed)
Stretching: Hamstring (Supine)    Attach belt around right foot and pull right leg up until a stretch is felt in hamstrings. Hold _30___ seconds. Repeat __3__ times per set. Do __1__ sets per session. Do _3___ sessions per day.  ITB stretch:  Attach belt around right foot and pull right leg straight up towards ceiling then allow right leg to fall across left leg until stretch felt in IT Band. Hold 30 seconds. Complete 3 times, 3 times a day.  http://orth.exer.us/656   Copyright  VHI. All rights reserved.

## 2015-01-11 NOTE — Therapy (Signed)
[facility] [place], [name], [address] Phone: [phone]   Fax:  [phone]  Physical Therapy Treatment  Patient Details  Name: SIDRAH DESHPANDE MRN: [id number] Date of Birth: 1950/12/03 Referring Provider: Vonna Kotyk Dettinger MD.  Encounter Date: 01/11/2015      PT End of Session - 01/11/15 0818    Visit Number 6   Number of Visits 12   Date for PT Re-Evaluation 02/01/15   PT Start Time 0818   PT Stop Time 0859   PT Time Calculation (min) 41 min   Activity Tolerance Patient tolerated treatment well   Behavior During Therapy [facility] for tasks assessed/performed      Past Medical History  Diagnosis Date  . Migraines   . Hyperlipidemia   . GERD (gastroesophageal reflux disease)     Past Surgical History  Procedure Laterality Date  . Abdominal hysterectomy    . Cholecystectomy      There were no vitals filed for this visit.  Visit Diagnosis:  Right hip pain  Weakness of right hip      Subjective Assessment - 01/11/15 0818    Subjective Reports R hip getting better but still tight and painful if palpated. Continues to report pain with sitting.   Patient Stated Goals Get out of pain.   Currently in Pain? Yes   Pain Score 1    Pain Location Hip   Pain Orientation Right;Upper;Lower   Pain Descriptors / Indicators Tightness;Aching            [facility] PT Assessment - 01/11/15 0001    Assessment   Medical Diagnosis IT band syndrome.                     [place] Adult PT Treatment/Exercise - 01/11/15 0001    Modalities   Modalities Electrical Stimulation;Moist Heat;Ultrasound   Moist Heat Therapy   Number Minutes Moist Heat 15 Minutes   Moist Heat Location Hip   Electrical Stimulation   Electrical Stimulation Location R ITB   Electrical Stimulation Action Pre-Mod   Electrical Stimulation Parameters 80-150 Hz x15 min   Electrical Stimulation Goals Pain;Tone   Ultrasound   Ultrasound Location R  ITB   Ultrasound Parameters 1.5 w/cm2, 100%, 1 mhz x10 min   Ultrasound Goals Pain   Manual Therapy   Manual Therapy Myofascial release   Myofascial Release Gentle IASTW to R ITB to decrease tightness, pain in L sidelying                PT Education - 01/11/15 0852    Education provided Yes   Education Details HEP- ITB, HS stretch   Person(s) Educated Patient   Methods Explanation;Demonstration;Verbal cues;Handout   Comprehension Verbalized understanding;Verbal cues required          PT Short Term Goals - 12/21/14 1131    PT SHORT TERM GOAL #1   Title Ind with HEP.   Time 2   Period Weeks   Status New           PT Long Term Goals - 12/21/14 1131    PT LONG TERM GOAL #1   Title Perform ADL's with pain not > 2-3/10.   Time 6   Period Weeks   Status New   PT LONG TERM GOAL #2   Title Sit 30 minutes with pain not > 3/10.   Time 6   Period Weeks   Status New   PT LONG TERM GOAL #3  Title Drive 30 minutes with pain not > 3/10.   Time 6   Period Weeks               Plan - 01/11/15 0848    Clinical Impression Statement Patient tolerated today's treatment well with no complaints of tenderness and increased pain during manual therapy. Continues to present with increased tightness in R ITB especially in mid to lower ITB and also presented with tightness in the lateral R hamstring today as well. Accepted new HEP for ITB and HS stretching with no questions. Normal modalities response noted following removal of the modalites. Reported 1-2/10 R hip pain in joint following today's treatment.   Pt will benefit from skilled therapeutic intervention in order to improve on the following deficits Pain;Decreased activity tolerance   Rehab Potential Excellent   PT Frequency 2x / week   PT Duration 6 weeks   PT Treatment/Interventions ADLs/Self Care Home Management;Cryotherapy;Electrical Stimulation;Therapeutic activities;Therapeutic exercise;Patient/family  education;Manual techniques   PT Next Visit Plan Continue per MPT POC.   PT Home Exercise Plan ITB and HS stretches   Consulted and Agree with Plan of Care Patient        Problem List Patient Active Problem List   Diagnosis Date Noted  . BMI 26.0-26.9,adult 05/21/2014  . Heel spur 05/21/2014  . Migraines 12/20/2012  . Insomnia 12/20/2012  . Depression 12/20/2012  . GERD (gastroesophageal reflux disease) 12/20/2012  . Hyperlipidemia with target LDL less than 100 12/20/2012    [name], PTA 01/11/2015, 9:01 AM  [facility] [address] [place], [name], [address] Phone: [phone]   Fax:  [phone]  Name: ALANNA SCHOCH MRN: [id number] Date of Birth: [birth year]

## 2015-01-15 ENCOUNTER — Encounter: Payer: Self-pay | Admitting: *Deleted

## 2015-01-15 ENCOUNTER — Ambulatory Visit: Payer: Medicare Other | Attending: Family Medicine | Admitting: *Deleted

## 2015-01-15 DIAGNOSIS — M25551 Pain in right hip: Secondary | ICD-10-CM | POA: Insufficient documentation

## 2015-01-15 DIAGNOSIS — R29898 Other symptoms and signs involving the musculoskeletal system: Secondary | ICD-10-CM | POA: Insufficient documentation

## 2015-01-15 NOTE — Therapy (Signed)
[facility] [place], [name], [address] Phone: [phone]   Fax:  [phone]  Physical Therapy Treatment  Patient Details  Name: Selena Barnes MRN: [id number] Date of Birth: 1950/10/08 Referring Provider: Vonna Kotyk Dettinger MD.  Encounter Date: 01/15/2015      PT End of Session - 01/15/15 0907    Visit Number 7   Number of Visits 12   Date for PT Re-Evaluation 02/01/15   PT Start Time 0816   PT Stop Time 0904   PT Time Calculation (min) 48 min      Past Medical History  Diagnosis Date  . Migraines   . Hyperlipidemia   . GERD (gastroesophageal reflux disease)     Past Surgical History  Procedure Laterality Date  . Abdominal hysterectomy    . Cholecystectomy      There were no vitals filed for this visit.  Visit Diagnosis:  Right hip pain  Weakness of right hip      Subjective Assessment - 01/15/15 0818    Subjective Reports R hip getting better but still tight and painful if palpated. Continues to report pain with sitting. 50% better overall   Patient Stated Goals Get out of pain.   Currently in Pain? Yes   Pain Score 4   sitting   Pain Location Hip   Pain Orientation Right   Pain Descriptors / Indicators Tightness;Sore;Aching   Aggravating Factors  sitting, driving                         [facility] Adult PT Treatment/Exercise - 01/15/15 0001    Modalities   Modalities Electrical Stimulation;Moist Heat;Ultrasound   Moist Heat Therapy   Number Minutes Moist Heat 15 Minutes   Moist Heat Location Hip   Electrical Stimulation   Electrical Stimulation Location R ITB  Premod x 15 mins 2 ch 80-_0  in LT sidelying   Electrical Stimulation Goals Pain;Tone   Ultrasound   Ultrasound Location RT ITB   Ultrasound Parameters   Combo  1.5 w/cm2  x 10   Ultrasound Goals Pain   Manual Therapy   Manual Therapy Myofascial release   Soft tissue mobilization ITB lifts and mobs to decrease  tightness    Myofascial Release Gentle MFR/TPR/ IASTW to R ITB to decrease tightness, pain in L sidelying                  PT Short Term Goals - 01/15/15 9702    PT SHORT TERM GOAL #1   Title Ind with HEP.   Period Weeks   Status Achieved           PT Long Term Goals - 01/15/15 6378    PT LONG TERM GOAL #1   Title Perform ADL's with pain not > 2-3/10.   Time 6   Period Weeks   Status On-going   PT LONG TERM GOAL #2   Title Sit 30 minutes with pain not > 3/10.   Period Weeks   Status On-going   PT LONG TERM GOAL #3   Title Drive 30 minutes with pain not > 3/10.   Time 6   Period Weeks   Status On-going               Plan - 01/15/15 0909    Clinical Impression Statement Pt did great and feels that she is 50% better overall, but is still very sore at her hip and long ITB.  Notable decreased tightness in ITB, but sore.  Her chief complaint is still when she is sitting. STG is met, but LTGs are not due to Pain   Pt will benefit from skilled therapeutic intervention in order to improve on the following deficits Pain;Decreased activity tolerance   PT Frequency 2x / week   PT Duration 6 weeks   PT Treatment/Interventions ADLs/Self Care Home Management;Cryotherapy;Electrical Stimulation;Therapeutic activities;Therapeutic exercise;Patient/family education;Manual techniques   PT Next Visit Plan Continue per MPT POC.   PT Home Exercise Plan ITB and HS stretches   Consulted and Agree with Plan of Care Patient        Problem List Patient Active Problem List   Diagnosis Date Noted  . BMI 26.0-26.9,adult 05/21/2014  . Heel spur 05/21/2014  . Migraines 12/20/2012  . Insomnia 12/20/2012  . Depression 12/20/2012  . GERD (gastroesophageal reflux disease) 12/20/2012  . Hyperlipidemia with target LDL less than 100 12/20/2012    Lillybeth Tal,CHRIS, PTA 01/15/2015, 9:16 AM  [facility] [place], [name],  [address] Phone: [phone]   Fax:  [phone]  Name: Selena Barnes MRN: [id number] Date of Birth: December 19, 1950

## 2015-01-19 ENCOUNTER — Encounter: Payer: Self-pay | Admitting: Physical Therapy

## 2015-01-19 ENCOUNTER — Ambulatory Visit: Payer: Medicare Other | Admitting: Physical Therapy

## 2015-01-19 DIAGNOSIS — R29898 Other symptoms and signs involving the musculoskeletal system: Secondary | ICD-10-CM | POA: Diagnosis not present

## 2015-01-19 DIAGNOSIS — M25551 Pain in right hip: Secondary | ICD-10-CM

## 2015-01-19 NOTE — Therapy (Signed)
[facility] [place], [name], [address] Phone: [phone]   Fax:  [phone]  Physical Therapy Treatment  Patient Details  Name: Selena Barnes MRN: [id number] Date of Birth: [birth year] Referring Provider: Vonna Kotyk Dettinger MD.  Encounter Date: 01/19/2015      PT End of Session - 01/19/15 0819    Visit Number 8   Number of Visits 12   Date for PT Re-Evaluation 02/01/15   PT Start Time 0820   PT Stop Time 0903   PT Time Calculation (min) 43 min   Activity Tolerance Patient tolerated treatment well   Behavior During Therapy [facility] for tasks assessed/performed      Past Medical History  Diagnosis Date  . Migraines   . Hyperlipidemia   . GERD (gastroesophageal reflux disease)     Past Surgical History  Procedure Laterality Date  . Abdominal hysterectomy    . Cholecystectomy      There were no vitals filed for this visit.  Visit Diagnosis:  Right hip pain  Weakness of right hip      Subjective Assessment - 01/19/15 0817    Subjective can still feel it while driving and sitting. Can touch ITB an still has soreness and bruising at injection. Still cannot lay on ITB.   Patient Stated Goals Get out of pain.   Currently in Pain? Yes   Pain Score 3    Pain Location Hip   Pain Orientation Right   Pain Descriptors / Indicators Sore;Tightness   Pain Frequency Intermittent   Aggravating Factors  Sitting, driving, laying on R side            [facility] PT Assessment - 01/19/15 0001    Assessment   Medical Diagnosis IT band syndrome.   Next MD Visit 02/03/2015                     [facility] Adult PT Treatment/Exercise - 01/19/15 0001    Modalities   Modalities Electrical Stimulation;Moist Heat;Ultrasound   Moist Heat Therapy   Number Minutes Moist Heat 15 Minutes   Moist Heat Location Hip   Electrical Stimulation   Electrical Stimulation Location R ITB   Electrical Stimulation Action Pre-Mod   Electrical Stimulation Parameters 80-150 Hz x15 min   Electrical Stimulation Goals Pain;Tone   Ultrasound   Ultrasound Location R ITB   Ultrasound Parameters 1.5 w/cm2, 100%, 1 mhz  x10 min   Ultrasound Goals Pain   Manual Therapy   Manual Therapy Myofascial release   Myofascial Release Gentle MFR/TPR/ IASTW to R ITB to decrease tightness, pain in L sidelying                  PT Short Term Goals - 01/15/15 [id number]    PT SHORT TERM GOAL #1   Title Ind with HEP.   Period Weeks   Status Achieved           PT Long Term Goals - 01/15/15 [id number]    PT LONG TERM GOAL #1   Title Perform ADL's with pain not > 2-3/10.   Time 6   Period Weeks   Status On-going   PT LONG TERM GOAL #2   Title Sit 30 minutes with pain not > 3/10.   Period Weeks   Status On-going   PT LONG TERM GOAL #3   Title Drive 30 minutes with pain not > 3/10.   Time 6   Period Weeks   Status On-going  Plan - 01/19/15 1045    Clinical Impression Statement Patient tolerated today's treatment well although she continues to have soreness along the R ITB and minimal bruising is still notable around injection site at R hip. Continues to have notable tightness and adhesions throughout the R ITB and was able to tolerate IASTW well in L sidelying. Normal modaliities response noted following removal of the modaliites. Goals remain on-going at this time secondary to soreness and discomfort with actvities.    Pt will benefit from skilled therapeutic intervention in order to improve on the following deficits Pain;Decreased activity tolerance   Rehab Potential Excellent   PT Frequency 2x / week   PT Duration 6 weeks   PT Treatment/Interventions ADLs/Self Care Home Management;Cryotherapy;Electrical Stimulation;Therapeutic activities;Therapeutic exercise;Patient/family education;Manual techniques   PT Next Visit Plan Continue per MPT POC.   PT Home Exercise Plan ITB and HS stretches   Consulted and  Agree with Plan of Care Patient        Problem List Patient Active Problem List   Diagnosis Date Noted  . BMI 26.0-26.9,adult 05/21/2014  . Heel spur 05/21/2014  . Migraines 12/20/2012  . Insomnia 12/20/2012  . Depression 12/20/2012  . GERD (gastroesophageal reflux disease) 12/20/2012  . Hyperlipidemia with target LDL less than 100 12/20/2012    [name], PTA 01/19/2015, 10:52 AM  [facility] [address] [place], [name], [address] Phone: [phone]   Fax:  [phone]  Name: Selena Barnes MRN: [id number] Date of Birth: 05/22/1950

## 2015-01-22 ENCOUNTER — Ambulatory Visit: Payer: Medicare Other | Admitting: Physical Therapy

## 2015-01-22 ENCOUNTER — Encounter: Payer: Self-pay | Admitting: Physical Therapy

## 2015-01-22 DIAGNOSIS — R29898 Other symptoms and signs involving the musculoskeletal system: Secondary | ICD-10-CM

## 2015-01-22 DIAGNOSIS — M25551 Pain in right hip: Secondary | ICD-10-CM | POA: Diagnosis not present

## 2015-01-22 NOTE — Therapy (Signed)
[facility] [place], [name], [address] Phone: [phone]   Fax:  [phone]  Physical Therapy Treatment  Patient Details  Name: Selena Barnes MRN: [id number] Date of Birth: January 11, 1951 Referring Provider: Vonna Kotyk Dettinger MD.  Encounter Date: 01/22/2015      PT End of Session - 01/22/15 0739    Visit Number 9   Number of Visits 12   Date for PT Re-Evaluation 02/01/15   PT Start Time 0740   PT Stop Time 0824   PT Time Calculation (min) 44 min   Activity Tolerance Patient tolerated treatment well   Behavior During Therapy [facility] for tasks assessed/performed      Past Medical History  Diagnosis Date  . Migraines   . Hyperlipidemia   . GERD (gastroesophageal reflux disease)     Past Surgical History  Procedure Laterality Date  . Abdominal hysterectomy    . Cholecystectomy      There were no vitals filed for this visit.  Visit Diagnosis:  Right hip pain  Weakness of right hip      Subjective Assessment - 01/22/15 0738    Subjective States that her hip is better but still feels it around R lateral hip especially with sitting and standing.   Patient Stated Goals Get out of pain.   Currently in Pain? Yes   Pain Score 1    Pain Location Hip   Pain Orientation Right   Pain Descriptors / Indicators Aching            [facility] PT Assessment - 01/22/15 0001    Assessment   Medical Diagnosis IT band syndrome.   Next MD Visit 02/03/2015                     [facility] Adult PT Treatment/Exercise - 01/22/15 0001    Modalities   Modalities Electrical Stimulation;Moist Heat;Ultrasound   Moist Heat Therapy   Number Minutes Moist Heat 15 Minutes   Moist Heat Location Hip   Electrical Stimulation   Electrical Stimulation Location R ITB   Electrical Stimulation Action Pre-Mod  2 channels   Electrical Stimulation Parameters 80-150 Hz x15 min   Electrical Stimulation Goals Pain;Tone   Ultrasound   Ultrasound Location R lateral hip   Ultrasound Parameters 1.5 w/cm2, 100%, 65mhz x10 min   Ultrasound Goals Pain   Manual Therapy   Manual Therapy Myofascial release   Myofascial Release IASTW/MFR to R ITB in L sidelying to decrease tightness and tone                   PT Short Term Goals - 01/15/15 [id number]    PT SHORT TERM GOAL #1   Title Ind with HEP.   Period Weeks   Status Achieved           PT Long Term Goals - 01/22/15 0815    PT LONG TERM GOAL #1   Title Perform ADL's with pain not > 2-3/10.   Time 6   Period Weeks   Status Achieved   PT LONG TERM GOAL #2   Title Sit 30 minutes with pain not > 3/10.   Period Weeks   Status Achieved   PT LONG TERM GOAL #3   Title Drive 30 minutes with pain not > 3/10.   Time 6   Period Weeks   Status Achieved               Plan - 01/22/15 [id number]  Clinical Impression Statement Patient tolerated today's treatment well although she reported soreness/tenderness around mid R ITB and towards distal R ITB just superior to R knee joint. Light pink bruising around R hip injection site still present. At beginning of IASTW and manual therapy session nodules of tightness are present throughout the R iTB and by end of the treatment the nodules had decreased but still slgihtly present. LT goals have been achieved at this time although per patient report she has pain when she first sits down but she jsut deals with the pain. Upon sitting or standing the pain is concentrated around the R lateral hip region per patient report. Normal modalities response noted following removal of the modalities. Cotninued to experience the tingle sensation from the stimulation treatment following treatment but did not report any pain.   Pt will benefit from skilled therapeutic intervention in order to improve on the following deficits Pain;Decreased activity tolerance   Rehab Potential Excellent   PT Frequency 2x / week   PT Duration 6 weeks   PT  Treatment/Interventions ADLs/Self Care Home Management;Cryotherapy;Electrical Stimulation;Therapeutic activities;Therapeutic exercise;Patient/family education;Manual techniques   PT Next Visit Plan Continue per MPT POC.   PT Home Exercise Plan ITB and HS stretches   Consulted and Agree with Plan of Care Patient        Problem List Patient Active Problem List   Diagnosis Date Noted  . BMI 26.0-26.9,adult 05/21/2014  . Heel spur 05/21/2014  . Migraines 12/20/2012  . Insomnia 12/20/2012  . Depression 12/20/2012  . GERD (gastroesophageal reflux disease) 12/20/2012  . Hyperlipidemia with target LDL less than 100 12/20/2012    [name], PTA 01/22/2015, 8:30 AM  [facility] [place], [name], [address] Phone: [phone]   Fax:  [phone]  Name: Selena Barnes MRN: [id number] Date of Birth: 08-15-1950

## 2015-01-25 ENCOUNTER — Encounter: Payer: Self-pay | Admitting: Physical Therapy

## 2015-01-25 ENCOUNTER — Ambulatory Visit: Payer: Medicare Other | Admitting: Physical Therapy

## 2015-01-25 DIAGNOSIS — R29898 Other symptoms and signs involving the musculoskeletal system: Secondary | ICD-10-CM

## 2015-01-25 DIAGNOSIS — M25551 Pain in right hip: Secondary | ICD-10-CM | POA: Diagnosis not present

## 2015-01-25 NOTE — Therapy (Signed)
[facility] [place], [name], [address] Phone: [phone]   Fax:  [phone]  Physical Therapy Treatment  Patient Details  Name: Selena Barnes MRN: [id number] Date of Birth: 08/13/1950 Referring Provider: Vonna Kotyk Dettinger MD.  Encounter Date: 01/25/2015      PT End of Session - 01/25/15 0815    Visit Number 10   Number of Visits 12   Date for PT Re-Evaluation 02/01/15   PT Start Time 0815   PT Stop Time 0856   PT Time Calculation (min) 41 min   Activity Tolerance Patient tolerated treatment well   Behavior During Therapy [facility] for tasks assessed/performed      Past Medical History  Diagnosis Date  . Migraines   . Hyperlipidemia   . GERD (gastroesophageal reflux disease)     Past Surgical History  Procedure Laterality Date  . Abdominal hysterectomy    . Cholecystectomy      There were no vitals filed for this visit.  Visit Diagnosis:  Right hip pain  Weakness of right hip      Subjective Assessment - 01/25/15 0814    Subjective States that she doesn't feel it unless she's sitting. Still pain mostly around L hip joint.   Patient Stated Goals Get out of pain.   Currently in Pain? Yes   Pain Score 2    Pain Location Hip   Pain Orientation Right   Pain Descriptors / Indicators Aching;Constant   Pain Frequency Intermittent            [facility] PT Assessment - 01/25/15 0001    Assessment   Medical Diagnosis IT band syndrome.   Next MD Visit 02/03/2015                     [facility] Adult PT Treatment/Exercise - 01/25/15 0001    Modalities   Modalities Electrical Stimulation;Moist Heat;Ultrasound   Moist Heat Therapy   Number Minutes Moist Heat 15 Minutes   Moist Heat Location Hip   Electrical Stimulation   Electrical Stimulation Location R ITB   Electrical Stimulation Action Pre-Mod  2 channels   Electrical Stimulation Parameters 80-150 Hz x15 min   Electrical Stimulation Goals  Pain;Tone   Ultrasound   Ultrasound Location R lateral hip   Ultrasound Parameters 1.5 w/cm2, 100%, 1 mhz x10 min   Ultrasound Goals Pain   Manual Therapy   Manual Therapy Myofascial release   Myofascial Release IASTW/MFR to R ITB in L sidelying to decrease tightness and tone                   PT Short Term Goals - 01/15/15 [id number]    PT SHORT TERM GOAL #1   Title Ind with HEP.   Period Weeks   Status Achieved           PT Long Term Goals - 01/22/15 0815    PT LONG TERM GOAL #1   Title Perform ADL's with pain not > 2-3/10.   Time 6   Period Weeks   Status Achieved   PT LONG TERM GOAL #2   Title Sit 30 minutes with pain not > 3/10.   Period Weeks   Status Achieved   PT LONG TERM GOAL #3   Title Drive 30 minutes with pain not > 3/10.   Time 6   Period Weeks   Status Achieved               Plan -  01/25/15 0852    Clinical Impression Statement Patient tolerated today's treatment well although she continues to have discomfort with sitting and with tenderness in the mid to lower R ITB. Bruising around injection site at R hip now mostly brown color and diminishing. Minimal nodules of tightness noted during today's treatment although if present they were in the mid to lower R ITB and along the posterior fibers. Minimal petiche presented in mid to lower R ITB during IASTW in left sidelying. Normal modalites response noted following removal of the modalites. Continues to have aching sensation around the R hip joint per patient report. Educated regarding continued use of HEP at home and of self massage along R ITB. Experienced tinging following stimulation session but no pain per patient report.   Pt will benefit from skilled therapeutic intervention in order to improve on the following deficits Pain;Decreased activity tolerance   Rehab Potential Excellent   PT Frequency 2x / week   PT Duration 6 weeks   PT Treatment/Interventions ADLs/Self Care Home  Management;Cryotherapy;Electrical Stimulation;Therapeutic activities;Therapeutic exercise;Patient/family education;Manual techniques   PT Next Visit Plan Continue per MPT POC.   PT Home Exercise Plan ITB and HS stretches   Consulted and Agree with Plan of Care Patient        Problem List Patient Active Problem List   Diagnosis Date Noted  . BMI 26.0-26.9,adult 05/21/2014  . Heel spur 05/21/2014  . Migraines 12/20/2012  . Insomnia 12/20/2012  . Depression 12/20/2012  . GERD (gastroesophageal reflux disease) 12/20/2012  . Hyperlipidemia with target LDL less than 100 12/20/2012    Ahmed Prima, PTA 01/25/2015 9:06 AM  [facility] [place], [name], [address] Phone: [phone]   Fax:  [phone]  Name: Selena Barnes MRN: [id number] Date of Birth: 03-20-1950

## 2015-01-29 ENCOUNTER — Ambulatory Visit: Payer: Medicare Other | Admitting: *Deleted

## 2015-01-29 DIAGNOSIS — R29898 Other symptoms and signs involving the musculoskeletal system: Secondary | ICD-10-CM | POA: Diagnosis not present

## 2015-01-29 DIAGNOSIS — M25551 Pain in right hip: Secondary | ICD-10-CM | POA: Diagnosis not present

## 2015-01-29 NOTE — Therapy (Signed)
[facility] [place], [name], [address] Phone: [phone]   Fax:  [phone]  Physical Therapy Treatment  Patient Details  Name: Selena Barnes MRN: [id number] Date of Birth: [birth year] Referring Provider: Vonna Kotyk Dettinger MD.  Encounter Date: 01/29/2015      PT End of Session - 01/29/15 0852    Visit Number 11   Number of Visits 12   Date for PT Re-Evaluation 02/01/15   PT Start Time 0815   PT Stop Time 0902   PT Time Calculation (min) 47 min      Past Medical History  Diagnosis Date  . Migraines   . Hyperlipidemia   . GERD (gastroesophageal reflux disease)     Past Surgical History  Procedure Laterality Date  . Abdominal hysterectomy    . Cholecystectomy      There were no vitals filed for this visit.  Visit Diagnosis:  Right hip pain  Weakness of right hip      Subjective Assessment - 01/29/15 0848    Subjective States that she doesn't feel it unless she's sitting. Still pain mostly around L hip joint. Doing 75% better just won't go away   Limitations Sitting   Patient Stated Goals Get out of pain.   Currently in Pain? Yes   Pain Score 2    Pain Location Hip   Pain Orientation Right   Pain Descriptors / Indicators Aching   Pain Type Chronic pain   Pain Frequency Intermittent   Aggravating Factors  sitting, driving   Pain Relieving Factors PT RXs                         [facility] Adult PT Treatment/Exercise - 01/29/15 0001    Modalities   Modalities Electrical Stimulation;Moist Heat;Ultrasound   Moist Heat Therapy   Number Minutes Moist Heat 15 Minutes   Moist Heat Location Hip   Electrical Stimulation   Electrical Stimulation Location R ITB  Premod x 15 mins 2 ch 80-150hz in LT sidelying   Electrical Stimulation Goals Pain;Tone   Ultrasound   Ultrasound Location RT hip   Ultrasound Parameters 1.5 w/cm2 x 10 mins   Ultrasound Goals Pain   Manual Therapy   Manual Therapy  Myofascial release   Soft tissue mobilization ITB lifts and mobs to decrease tightness    Myofascial Release IASTW/MFR to R ITB in L sidelying to decrease tightness and tone                   PT Short Term Goals - 01/15/15 9798    PT SHORT TERM GOAL #1   Title Ind with HEP.   Period Weeks   Status Achieved           PT Long Term Goals - 01/22/15 0815    PT LONG TERM GOAL #1   Title Perform ADL's with pain not > 2-3/10.   Time 6   Period Weeks   Status Achieved   PT LONG TERM GOAL #2   Title Sit 30 minutes with pain not > 3/10.   Period Weeks   Status Achieved   PT LONG TERM GOAL #3   Title Drive 30 minutes with pain not > 3/10.   Time 6   Period Weeks   Status Achieved               Plan - 01/29/15 0853    Clinical Impression Statement Pt has progressed well  and feels at leasst 75% better over all. She has MET goals< but continues to have pain around RT Hip when driving and lying on her RT side. She was hoping to totally get rid of this pain. She goes to MD next week and is hoping to get  an injection to help.   Pt will benefit from skilled therapeutic intervention in order to improve on the following deficits Pain;Decreased activity tolerance   Rehab Potential Excellent   PT Frequency 2x / week   PT Duration 6 weeks   PT Next Visit Plan 1 visit left  MD NOTE   PT Home Exercise Plan ITB and HS stretches   Consulted and Agree with Plan of Care Patient        Problem List Patient Active Problem List   Diagnosis Date Noted  . BMI 26.0-26.9,adult 05/21/2014  . Heel spur 05/21/2014  . Migraines 12/20/2012  . Insomnia 12/20/2012  . Depression 12/20/2012  . GERD (gastroesophageal reflux disease) 12/20/2012  . Hyperlipidemia with target LDL less than 100 12/20/2012    RAMSEUR,CHRIS, PTA 01/29/2015, 9:08 AM  [facility] [address] [place], [place], [address] Phone: [phone]   Fax:   [phone]  Name: Selena Barnes MRN: [id number] Date of Birth: 04/13/1950     

## 2015-02-01 ENCOUNTER — Ambulatory Visit: Payer: Medicare Other | Admitting: Physical Therapy

## 2015-02-01 ENCOUNTER — Encounter: Payer: Self-pay | Admitting: Physical Therapy

## 2015-02-01 DIAGNOSIS — R29898 Other symptoms and signs involving the musculoskeletal system: Secondary | ICD-10-CM

## 2015-02-01 DIAGNOSIS — M25551 Pain in right hip: Secondary | ICD-10-CM

## 2015-02-01 NOTE — Therapy (Signed)
[facility] [place], [name], [address] Phone: [phone]   Fax:  [phone]  Physical Therapy Treatment  Patient Details  Name: Selena Barnes MRN: [id number] Date of Birth: 05-29-1950 Referring Provider: Vonna Kotyk Dettinger MD.  Encounter Date: 02/01/2015      PT End of Session - 02/01/15 0817    Visit Number 12   Number of Visits 12   Date for PT Re-Evaluation 02/01/15   PT Start Time 0817   PT Stop Time 0903   PT Time Calculation (min) 46 min   Activity Tolerance Patient tolerated treatment well   Behavior During Therapy [facility] for tasks assessed/performed      Past Medical History  Diagnosis Date  . Migraines   . Hyperlipidemia   . GERD (gastroesophageal reflux disease)     Past Surgical History  Procedure Laterality Date  . Abdominal hysterectomy    . Cholecystectomy      There were no vitals filed for this visit.  Visit Diagnosis:  Right hip pain  Weakness of right hip      Subjective Assessment - 02/01/15 0816    Subjective States that she only feels it if she touches around R hip. Drove to clinic and sat in waiting room and did not feel it per patient report.   Limitations Sitting   Patient Stated Goals Get out of pain.   Currently in Pain? Yes   Pain Score 3    Pain Location Hip   Pain Orientation Right   Pain Descriptors / Indicators Sore   Pain Type Chronic pain   Pain Frequency Intermittent            [facility] PT Assessment - 02/01/15 0001    Assessment   Medical Diagnosis IT band syndrome.   Next MD Visit 02/03/2015                     [facility] Adult PT Treatment/Exercise - 02/01/15 0001    Modalities   Modalities Electrical Stimulation;Moist Heat;Ultrasound   Moist Heat Therapy   Number Minutes Moist Heat 15 Minutes   Moist Heat Location Hip   Electrical Stimulation   Electrical Stimulation Location R ITB   Electrical Stimulation Action Pre-Mod  2 channels    Electrical Stimulation Parameters 80-150 Hz x15 min   Electrical Stimulation Goals Pain;Tone   Ultrasound   Ultrasound Location R Hip   Ultrasound Parameters 1.5 w/cm2, 100%,1 mhz x39mn   Ultrasound Goals Pain   Manual Therapy   Manual Therapy Myofascial release   Myofascial Release IASTW to R ITB in L sidelying to decrease tightness and tone                   PT Short Term Goals - 01/15/15 07371   PT SHORT TERM GOAL #1   Title Ind with HEP.   Period Weeks   Status Achieved           PT Long Term Goals - 01/22/15 0815    PT LONG TERM GOAL #1   Title Perform ADL's with pain not > 2-3/10.   Time 6   Period Weeks   Status Achieved   PT LONG TERM GOAL #2   Title Sit 30 minutes with pain not > 3/10.   Period Weeks   Status Achieved   PT LONG TERM GOAL #3   Title Drive 30 minutes with pain not > 3/10.   Time 6   Period Weeks  Status Achieved               Plan - 02/01/15 0851    Clinical Impression Statement Patient has progressed with PT treatments and has achieved all goals set during evaluation atlhough today she reports pain with palpating R lateral hip region. Had no pain with sitting or driving today per patient report. Very light pink bruising present over the injection site from October in R lateral hip. Moderate petiche appeared over the distal R ITB where adhesions were noted during IASTW but tightness has decreased from when patient began PT for current issue. Experienced soreness during IASTW over the R distal ITB per patient report. Encouraged to continue HS and ITB stretch along with HEP given by MD following PT discharge. Normal modalities response noted following removal of the modalites. Denied R hip pain following today's treatment.   Pt will benefit from skilled therapeutic intervention in order to improve on the following deficits Pain;Decreased activity tolerance   Rehab Potential Excellent   PT Frequency 2x / week   PT Duration 6 weeks    PT Treatment/Interventions ADLs/Self Care Home Management;Cryotherapy;Electrical Stimulation;Therapeutic activities;Therapeutic exercise;Patient/family education;Manual techniques   PT Next Visit Plan Communicate need for D/C summary to MPT.   PT Home Exercise Plan ITB and HS stretches   Consulted and Agree with Plan of Care Patient        Problem List Patient Active Problem List   Diagnosis Date Noted  . BMI 26.0-26.9,adult 05/21/2014  . Heel spur 05/21/2014  . Migraines 12/20/2012  . Insomnia 12/20/2012  . Depression 12/20/2012  . GERD (gastroesophageal reflux disease) 12/20/2012  . Hyperlipidemia with target LDL less than 100 12/20/2012    Ahmed Prima, PTA 02/01/2015 9:08 AM  [facility] [place], [name], [address] Phone: [phone]   Fax:  [phone]  Name: Selena Barnes MRN: [id number] Date of Birth: [birth year]

## 2015-02-01 NOTE — Therapy (Signed)
[facility] [place], [name], [address] Phone: [phone]   Fax:  [phone]  Physical Therapy Treatment  Patient Details  Name: Selena Barnes MRN: [id number] Date of Birth: 05-29-1950 Referring Provider: Vonna Kotyk Dettinger MD.  Encounter Date: 02/01/2015      PT End of Session - 02/01/15 0817    Visit Number 12   Number of Visits 12   Date for PT Re-Evaluation 02/01/15   PT Start Time 0817   PT Stop Time 0903   PT Time Calculation (min) 46 min   Activity Tolerance Patient tolerated treatment well   Behavior During Therapy [facility] for tasks assessed/performed      Past Medical History  Diagnosis Date  . Migraines   . Hyperlipidemia   . GERD (gastroesophageal reflux disease)     Past Surgical History  Procedure Laterality Date  . Abdominal hysterectomy    . Cholecystectomy      There were no vitals filed for this visit.  Visit Diagnosis:  Right hip pain  Weakness of right hip      Subjective Assessment - 02/01/15 0816    Subjective States that she only feels it if she touches around R hip. Drove to clinic and sat in waiting room and did not feel it per patient report.   Limitations Sitting   Patient Stated Goals Get out of pain.   Currently in Pain? Yes   Pain Score 3    Pain Location Hip   Pain Orientation Right   Pain Descriptors / Indicators Sore   Pain Type Chronic pain   Pain Frequency Intermittent            [facility] PT Assessment - 02/01/15 0001    Assessment   Medical Diagnosis IT band syndrome.   Next MD Visit 02/03/2015                     [facility] Adult PT Treatment/Exercise - 02/01/15 0001    Modalities   Modalities Electrical Stimulation;Moist Heat;Ultrasound   Moist Heat Therapy   Number Minutes Moist Heat 15 Minutes   Moist Heat Location Hip   Electrical Stimulation   Electrical Stimulation Location R ITB   Electrical Stimulation Action Pre-Mod  2 channels    Electrical Stimulation Parameters 80-150 Hz x15 min   Electrical Stimulation Goals Pain;Tone   Ultrasound   Ultrasound Location R Hip   Ultrasound Parameters 1.5 w/cm2, 100%,1 mhz x39mn   Ultrasound Goals Pain   Manual Therapy   Manual Therapy Myofascial release   Myofascial Release IASTW to R ITB in L sidelying to decrease tightness and tone                   PT Short Term Goals - 01/15/15 07371   PT SHORT TERM GOAL #1   Title Ind with HEP.   Period Weeks   Status Achieved           PT Long Term Goals - 01/22/15 0815    PT LONG TERM GOAL #1   Title Perform ADL's with pain not > 2-3/10.   Time 6   Period Weeks   Status Achieved   PT LONG TERM GOAL #2   Title Sit 30 minutes with pain not > 3/10.   Period Weeks   Status Achieved   PT LONG TERM GOAL #3   Title Drive 30 minutes with pain not > 3/10.   Time 6   Period Weeks  Status Achieved               Plan - 02/01/15 0851    Clinical Impression Statement Patient has progressed with PT treatments and has achieved all goals set during evaluation atlhough today she reports pain with palpating R lateral hip region. Had no pain with sitting or driving today per patient report. Very light pink bruising present over the injection site from October in R lateral hip. Moderate petiche appeared over the distal R ITB where adhesions were noted during IASTW but tightness has decreased from when patient began PT for current issue. Experienced soreness during IASTW over the R distal ITB per patient report. Encouraged to continue HS and ITB stretch along with HEP given by MD following PT discharge. Normal modalities response noted following removal of the modalites. Denied R hip pain following today's treatment.   Pt will benefit from skilled therapeutic intervention in order to improve on the following deficits Pain;Decreased activity tolerance   Rehab Potential Excellent   PT Frequency 2x / week   PT Duration 6 weeks    PT Treatment/Interventions ADLs/Self Care Home Management;Cryotherapy;Electrical Stimulation;Therapeutic activities;Therapeutic exercise;Patient/family education;Manual techniques   PT Next Visit Plan Communicate need for D/C summary to MPT.   PT Home Exercise Plan ITB and HS stretches   Consulted and Agree with Plan of Care Patient        Problem List Patient Active Problem List   Diagnosis Date Noted  . BMI 26.0-26.9,adult 05/21/2014  . Heel spur 05/21/2014  . Migraines 12/20/2012  . Insomnia 12/20/2012  . Depression 12/20/2012  . GERD (gastroesophageal reflux disease) 12/20/2012  . Hyperlipidemia with target LDL less than 100 12/20/2012   PHYSICAL THERAPY DISCHARGE SUMMARY  Visits from Start of Care: 12  Current functional level related to goals / functional outcomes: Please see above.   Remaining deficits: All goals met.   Education / Equipment: HEP. Plan: Patient agrees to discharge.  Patient goals were met. Patient is being discharged due to meeting the stated rehab goals.  ?????       Wali Reinheimer, [place] MPT 02/01/2015, 1:30 PM  [facility] [address] [place], [name], [address] Phone: [phone]   Fax:  [phone]  Name: Selena Barnes MRN: [id number] Date of Birth: [birth year]

## 2015-02-03 ENCOUNTER — Encounter: Payer: Self-pay | Admitting: Family Medicine

## 2015-02-03 ENCOUNTER — Ambulatory Visit (INDEPENDENT_AMBULATORY_CARE_PROVIDER_SITE_OTHER): Payer: Medicare Other | Admitting: Family Medicine

## 2015-02-03 VITALS — BP 117/64 | HR 79 | Temp 99.3°F | Ht 64.0 in | Wt 149.0 lb

## 2015-02-03 DIAGNOSIS — M8580 Other specified disorders of bone density and structure, unspecified site: Secondary | ICD-10-CM | POA: Diagnosis not present

## 2015-02-03 DIAGNOSIS — K219 Gastro-esophageal reflux disease without esophagitis: Secondary | ICD-10-CM | POA: Diagnosis not present

## 2015-02-03 DIAGNOSIS — Z1211 Encounter for screening for malignant neoplasm of colon: Secondary | ICD-10-CM | POA: Diagnosis not present

## 2015-02-03 DIAGNOSIS — M7631 Iliotibial band syndrome, right leg: Secondary | ICD-10-CM | POA: Diagnosis not present

## 2015-02-03 DIAGNOSIS — K921 Melena: Secondary | ICD-10-CM

## 2015-02-03 MED ORDER — METHYLPREDNISOLONE ACETATE 80 MG/ML IJ SUSP
40.0000 mg | Freq: Once | INTRAMUSCULAR | Status: AC
  Administered 2015-02-03: 40 mg via INTRALESIONAL

## 2015-02-03 NOTE — Progress Notes (Signed)
BP 117/64 mmHg  Pulse 79  Temp(Src) 99.3 F (37.4 C) (Oral)  Ht 5\' 4"  (1.626 m)  Wt 149 lb (67.586 kg)  BMI 25.56 kg/m2   Subjective:    Patient ID: Selena Barnes, female    DOB: 1950-06-23, 64 y.o.   MRN: [id number]  HPI: Selena Barnes is a 64 y.o. female presenting on 02/03/2015 for Hyperlipidemia; Sinusitis; and Hip Pain   HPI GERD Patient has had EGDs previously and is currently on Nexium for her acid reflux. She is concerned because she has started to notice possibly looks like blood in her stool. She is also been having more significant heartburn that is uncontrolled more recently. She also has indigestion and burning in her stomach intermittently. She plans to call and get an appointment with GI as soon as possible. She also thinks she may be due for another colonoscopy as well. She says the blood is small amounts. She denies any lightheadedness or dizziness or chest pain.  Osteopenia Patient has been diagnosed with osteopenia before. She has had DEXA scans but it is been a couple years. She is currently taking calcium and vitamin D.  IT band syndrome Patient was seen 2 months ago for IT band syndrome is coming back today because it is still there but much improved. She has been going to physical therapy and received a steroid injection in the area 2 months ago. She denies any overlying skin changes or swelling or redness. Her upper IT band near the hip is where she has most of her tenderness but she also has some down closer to her knee.  Relevant past medical, surgical, family and social history reviewed and updated as indicated. Interim medical history since our last visit reviewed. Allergies and medications reviewed and updated.  Review of Systems  Constitutional: Negative for fever and chills.  HENT: Negative for congestion, ear discharge and ear pain.   Eyes: Negative for redness and visual disturbance.  Respiratory: Negative for chest tightness and shortness of  breath.   Cardiovascular: Negative for chest pain and leg swelling.  Gastrointestinal: Positive for abdominal pain and blood in stool. Negative for nausea, vomiting, constipation and abdominal distention.  Genitourinary: Negative for dysuria and difficulty urinating.  Musculoskeletal: Positive for myalgias. Negative for back pain, joint swelling and gait problem.  Skin: Negative for rash.  Neurological: Negative for light-headedness and headaches.  Psychiatric/Behavioral: Negative for behavioral problems and agitation.  All other systems reviewed and are negative.   Per HPI unless specifically indicated above     Medication List       This list is accurate as of: 02/03/15 11:16 AM.  Always use your most recent med list.               aspirin 81 MG tablet  Take 81 mg by mouth daily.     CALTRATE 600+D PO  Take by mouth.     diclofenac 50 MG tablet  Commonly known as:  CATAFLAM  Take 1 tablet (50 mg total) by mouth 3 (three) times daily.     DULoxetine 60 MG capsule  Commonly known as:  CYMBALTA  Take 1 capsule (60 mg total) by mouth 2 (two) times daily.     esomeprazole 40 MG capsule  Commonly known as:  NEXIUM  Take 1 capsule (40 mg total) by mouth daily at 12 noon.     estradiol 1 MG tablet  Commonly known as:  ESTRACE  Take 1 tablet (1 mg  total) by mouth daily.     fish oil-omega-3 fatty acids 1000 MG capsule  Take 1 g by mouth daily. Take 4 pills qd     metaxalone 800 MG tablet  Commonly known as:  SKELAXIN  Take 1 tablet (800 mg total) by mouth 3 (three) times daily.     promethazine 25 MG tablet  Commonly known as:  PHENERGAN  Take 1 tablet (25 mg total) by mouth every 6 (six) hours as needed for nausea or vomiting.     rizatriptan 10 MG disintegrating tablet  Commonly known as:  MAXALT-MLT  Take 1 tablet (10 mg total) by mouth as needed for migraine. May repeat in 2 hours if needed     rosuvastatin 10 MG tablet  Commonly known as:  CRESTOR  Take 1  tablet (10 mg total) by mouth daily.     topiramate 100 MG tablet  Commonly known as:  TOPAMAX  Take 1 tablet (100 mg total) by mouth daily.     Vitamin D3 2000 UNITS capsule  Take 2,000 Units by mouth 2 (two) times daily.     zolpidem 10 MG tablet  Commonly known as:  AMBIEN  Take 1 tablet (10 mg total) by mouth at bedtime as needed for sleep.           Objective:    BP 117/64 mmHg  Pulse 79  Temp(Src) 99.3 F (37.4 C) (Oral)  Ht 5\' 4"  (1.626 m)  Wt 149 lb (67.586 kg)  BMI 25.56 kg/m2  Wt Readings from Last 3 Encounters:  02/03/15 149 lb (67.586 kg)  12/11/14 151 lb 3.2 oz (68.584 kg)  11/03/14 151 lb 6.4 oz (68.675 kg)    Physical Exam  Constitutional: She is oriented to person, place, and time. She appears well-developed and well-nourished. No distress.  Eyes: Conjunctivae and EOM are normal. Pupils are equal, round, and reactive to light.  Neck: Neck supple. No thyromegaly present.  Cardiovascular: Normal rate, regular rhythm, normal heart sounds and intact distal pulses.   No murmur heard. Pulmonary/Chest: Effort normal and breath sounds normal. No respiratory distress. She has no wheezes.  Abdominal: Soft. Bowel sounds are normal. She exhibits no distension. There is tenderness in the epigastric area. There is no rigidity, no rebound, no guarding, no CVA tenderness, no tenderness at McBurney's point and negative Murphy's sign.  Musculoskeletal: Normal range of motion. She exhibits no edema or tenderness.       Left hip: She exhibits normal range of motion and normal strength.       Legs: Lymphadenopathy:    She has no cervical adenopathy.  Neurological: She is alert and oriented to person, place, and time. Coordination normal.  Skin: Skin is warm and dry. No rash noted. She is not diaphoretic.  Psychiatric: She has a normal mood and affect. Her behavior is normal.  Vitals reviewed.   Results for orders placed or performed in visit on 11/04/14  Lipid panel    Result Value Ref Range   Cholesterol, Total 145 100 - 199 mg/dL   Triglycerides 82 0 - 149 mg/dL   HDL 62 >39 mg/dL   VLDL Cholesterol Cal 16 5 - 40 mg/dL   LDL Calculated 67 0 - 99 mg/dL   Chol/HDL Ratio 2.3 0.0 - 4.4 ratio units      IT band injection: Risk factors of bleeding and infection discussed with patient and patient is agreeable towards injection. Patient prepped with Betadine. Lateral approach towards injection used on  specific trigger point. Injected 40 mg of Depo-Medrol and 1 mL of 2% lidocaine. Patient tolerated procedure well and no side effects from noted. Minimal to no bleeding. Simple bandage applied after.  Assessment & Plan:   Problem List Items Addressed This Visit      Digestive   GERD (gastroesophageal reflux disease)    Other Visit Diagnoses    Special screening for malignant neoplasms, colon    -  Primary    Relevant Orders    Fecal occult blood, imunochemical    Hematochezia         Will calll GI and go back for repeat EGD and colonoscopy. on Nexium, gave hemoccult    Osteopenia        on calcium and vitamiin D , repeat DEXA scan    Relevant Orders    DG Bone Density    IT band syndrome, right        Relevant Medications    methylPREDNISolone acetate (DEPO-MEDROL) injection 40 mg        Follow up plan: Return in about 3 months (around 05/04/2015), or if symptoms worsen or fail to improve, for Hyperlipidemia and labs.  Counseling provided for all of the vaccine components Orders Placed This Encounter  Procedures  . Fecal occult blood, imunochemical  . DG Bone Density    Caryl Pina, MD [facility] 02/03/2015, 11:16 AM

## 2015-02-04 ENCOUNTER — Ambulatory Visit (INDEPENDENT_AMBULATORY_CARE_PROVIDER_SITE_OTHER): Payer: Medicare Other

## 2015-02-04 DIAGNOSIS — M899 Disorder of bone, unspecified: Secondary | ICD-10-CM

## 2015-02-04 DIAGNOSIS — M8580 Other specified disorders of bone density and structure, unspecified site: Secondary | ICD-10-CM

## 2015-02-04 NOTE — Progress Notes (Signed)
Pt has appt on 02/05/2015 to come in and discuss results

## 2015-02-05 ENCOUNTER — Ambulatory Visit (INDEPENDENT_AMBULATORY_CARE_PROVIDER_SITE_OTHER): Payer: Medicare Other | Admitting: Pharmacist

## 2015-02-05 ENCOUNTER — Encounter: Payer: Self-pay | Admitting: Pharmacist

## 2015-02-05 VITALS — Ht 64.0 in | Wt 148.0 lb

## 2015-02-05 DIAGNOSIS — M8580 Other specified disorders of bone density and structure, unspecified site: Secondary | ICD-10-CM | POA: Diagnosis not present

## 2015-02-05 MED ORDER — RALOXIFENE HCL 60 MG PO TABS
60.0000 mg | ORAL_TABLET | Freq: Every day | ORAL | Status: DC
Start: 2015-02-05 — End: 2015-12-28

## 2015-02-05 NOTE — Progress Notes (Signed)
Patient ID: Selena Barnes, female   DOB: [birth year], 64 y.o.   MRN: [id number]  [facility]osis [facility] Current Height: Height: 5\' 4"  (162.6 cm)      Max Lifetime Height:  5\' 5"  Current Weight: Weight: 148 lb (67.132 kg)       Ethnicity:Caucasian      HPI: Does pt already have a diagnosis of:  Osteopenia?  Yes Osteoporosis?  No  Back Pain?  Yes       Kyphosis?  No Prior fracture?  No Med(s) for Osteoporosis/Osteopenia:  None currently Med(s) previously tried for Osteoporosis/Osteopenia:  none                                                             PMH: Age at menopause:  Complete hysterectomy at 64 yo Hysterectomy?  Yes Oophorectomy?  Yes HRT? Yes - Former.  Type/duration: estradiol for 40 years - just stopped recently Steroid Use?  No Thyroid med?  No History of cancer?  No History of digestive disorders (ie Crohn's)?  Yes - GERD on chronic PPI therapy Current or previous eating disorders?  No Last Vitamin D Result:  Needed - ordered today Last GFR Result:  95 (05/21/2014)   FH/SH: Family history of osteoporosis?  No Parent with history of hip fracture?  Yes - mother fractured hip Family history of breast cancer?  No Exercise?  Yes - comes to PT / rehab 3 times per week.  IT band therapy Smoking?  No Alcohol?  Yes - occasional use    Calcium Assessment Calcium Intake  # of servings/day  Calcium mg  Milk (8 oz) 0  x  300  = 0  Yogurt (4 oz) 0 x  200 = 0  Cheese (1 oz) 1 x  200 = 200mg   Other Calcium sources   250mg   Ca supplement 500mg  + MVI qd = 900mg    Estimated calcium intake per day 1350mg     DEXA Results Date of Test T-Score for AP Spine L1-L4 T-Score for Total Left Hip T-Score for Total Right Hip  02/04/2015 -1.8 -0.4 -0.9  11/23/2010 -1.5 -0.5 -0.6  11/04/2008 -2.1 -0.7 -0.8  10/22/2006 -2.0 -0.7 -0.8   FRAX 10 year estimate: Total FX risk:  20%  (consider medication if >/= 20%) Hip FX risk:  1.7%  (consider medication if >/=  3%)  Assessment: Osteopenia with high fracture risk and recent discontinuation of long term hormone replacement.  Recommendations: 1.  Start  reloxifine (EVISTA) 60mg  take 1 tablet daily 2.  continue calcium 1200mg  daily through supplementation or diet.  3.  continue weight bearing exercise - 30 minutes at least 4 days per week.   4.  Counseled and educated about fall risk and prevention. 5.  Checking vitamin d level today  Recheck DEXA:  2 years  Time spent counseling patient:  30 minutes   Cherre Robins, PharmD, CPP

## 2015-02-05 NOTE — Patient Instructions (Signed)
Exercise for Strong Bones  Exercise is important to build and maintain strong bones / bone density.  There are 2 types of exercises that are important to building and maintaining strong bones:  Weight- bearing and muscle-stregthening.  Weight-bearing Exercises  These exercises include activities that make you move against gravity while staying upright. Weight-bearing exercises can be high-impact or low-impact.  High-impact weight-bearing exercises help build bones and keep them strong. If you have broken a bone due to osteoporosis or are at risk of breaking a bone, you may need to avoid high-impact exercises. If you're not sure, you should check with your healthcare provider.  Examples of high-impact weight-bearing exercises are: Dancing  Doing high-impact aerobics  Hiking  Jogging/running  Jumping Rope  Stair climbing  Tennis  Low-impact weight-bearing exercises can also help keep bones strong and are a safe alternative if you cannot do high-impact exercises.   Examples of low-impact weight-bearing exercises are: Using elliptical training machines  Doing low-impact aerobics  Using stair-step machines  Fast walking on a treadmill or outside   Muscle-Strengthening Exercises These exercises include activities where you move your body, a weight or some other resistance against gravity. They are also known as resistance exercises and include: Lifting weights  Using elastic exercise bands  Using weight machines  Lifting your own body weight  Functional movements, such as standing and rising up on your toes  Yoga and Pilates can also improve strength, balance and flexibility. However, certain positions may not be safe for people with osteoporosis or those at increased risk of broken bones. For example, exercises that have you bend forward may increase the chance of breaking a bone in the spine.   Non-Impact Exercises There are other types of exercises that can help  prevent falls.  Non-impact exercises can help you to improve balance, posture and how well you move in everyday activities. Some of these exercises include: Balance exercises that strengthen your legs and test your balance, such as Tai Chi, can decrease your risk of falls.  Posture exercises that improve your posture and reduce rounded or "sloping" shoulders can help you decrease the chance of breaking a bone, especially in the spine.  Functional exercises that improve how well you move can help you with everyday activities and decrease your chance of falling and breaking a bone. For example, if you have trouble getting up from a chair or climbing stairs, you should do these activities as exercises.   **A physical therapist can teach you balance, posture and functional exercises. He/she can also help you learn which exercises are safe and appropriate for you.  [facility] has a physical therapy office in [place] in front of our office and referrals can be made for assessments and treatment as needed and strength and balance training.  If you would like to have an assessment with [facility] and our physical therapy team please let a nurse or provider know.    Fall Prevention in the Home  Falls can cause injuries and can affect people from all age groups. There are many simple things that you can do to make your home safe and to help prevent falls. WHAT CAN I DO ON THE OUTSIDE OF MY HOME?  Regularly repair the edges of walkways and driveways and fix any cracks.  Remove high doorway thresholds.  Trim any shrubbery on the main path into your home.  Use bright outdoor lighting.  Clear walkways of debris and clutter, including tools and rocks.  Regularly check   that handrails are securely fastened and in good repair. Both sides of any steps should have handrails.  Install guardrails along the edges of any raised decks or porches.  Have leaves, snow, and ice cleared regularly.  Use sand or salt on  walkways during winter months.  In the garage, clean up any spills right away, including grease or oil spills. WHAT CAN I DO IN THE BATHROOM?  Use night lights.  Install grab bars by the toilet and in the tub and shower. Do not use towel bars as grab bars.  Use non-skid mats or decals on the floor of the tub or shower.  If you need to sit down while you are in the shower, use a plastic, non-slip stool..  Keep the floor dry. Immediately clean up any water that spills on the floor.  Remove soap buildup in the tub or shower on a regular basis.  Attach bath mats securely with double-sided non-slip rug tape.  Remove throw rugs and other tripping hazards from the floor. WHAT CAN I DO IN THE BEDROOM?  Use night lights.  Make sure that a bedside light is easy to reach.  Do not use oversized bedding that drapes onto the floor.  Have a firm chair that has side arms to use for getting dressed.  Remove throw rugs and other tripping hazards from the floor. WHAT CAN I DO IN THE KITCHEN?   Clean up any spills right away.  Avoid walking on wet floors.  Place frequently used items in easy-to-reach places.  If you need to reach for something above you, use a sturdy step stool that has a grab bar.  Keep electrical cables out of the way.  Do not use floor polish or wax that makes floors slippery. If you have to use wax, make sure that it is non-skid floor wax.  Remove throw rugs and other tripping hazards from the floor. WHAT CAN I DO IN THE STAIRWAYS?  Do not leave any items on the stairs.  Make sure that there are handrails on both sides of the stairs. Fix handrails that are broken or loose. Make sure that handrails are as long as the stairways.  Check any carpeting to make sure that it is firmly attached to the stairs. Fix any carpet that is loose or worn.  Avoid having throw rugs at the top or bottom of stairways, or secure the rugs with carpet tape to prevent them from  moving.  Make sure that you have a light switch at the top of the stairs and the bottom of the stairs. If you do not have them, have them installed. WHAT ARE SOME OTHER FALL PREVENTION TIPS?  Wear closed-toe shoes that fit well and support your feet. Wear shoes that have rubber soles or low heels.  When you use a stepladder, make sure that it is completely opened and that the sides are firmly locked. Have someone hold the ladder while you are using it. Do not climb a closed stepladder.  Add color or contrast paint or tape to grab bars and handrails in your home. Place contrasting color strips on the first and last steps.  Use mobility aids as needed, such as canes, walkers, scooters, and crutches.  Turn on lights if it is dark. Replace any light bulbs that burn out.  Set up furniture so that there are clear paths. Keep the furniture in the same spot.  Fix any uneven floor surfaces.  Choose a carpet design that does not   hide the edge of steps of a stairway.  Be aware of any and all pets.  Review your medicines with your healthcare provider. Some medicines can cause dizziness or changes in blood pressure, which increase your risk of falling. Talk with your health care provider about other ways that you can decrease your risk of falls. This may include working with a physical therapist or trainer to improve your strength, balance, and endurance.   This information is not intended to replace advice given to you by your health care provider. Make sure you discuss any questions you have with your health care provider.   Document Released: 01/20/2002 Document Revised: 06/16/2014 Document Reviewed: 03/06/2014 Elsevier Interactive Patient Education 2016 [employer]  

## 2015-02-06 LAB — VITAMIN D 25 HYDROXY (VIT D DEFICIENCY, FRACTURES): Vit D, 25-Hydroxy: 48.7 ng/mL (ref 30.0–100.0)

## 2015-05-17 ENCOUNTER — Ambulatory Visit: Payer: Medicare Other | Admitting: Family Medicine

## 2015-05-18 ENCOUNTER — Ambulatory Visit (INDEPENDENT_AMBULATORY_CARE_PROVIDER_SITE_OTHER): Payer: Medicare Other | Admitting: Family Medicine

## 2015-05-18 ENCOUNTER — Encounter: Payer: Self-pay | Admitting: Family Medicine

## 2015-05-18 VITALS — BP 128/79 | HR 74 | Temp 98.4°F | Ht 64.0 in | Wt 152.4 lb

## 2015-05-18 DIAGNOSIS — K219 Gastro-esophageal reflux disease without esophagitis: Secondary | ICD-10-CM | POA: Diagnosis not present

## 2015-05-18 DIAGNOSIS — F329 Major depressive disorder, single episode, unspecified: Secondary | ICD-10-CM | POA: Diagnosis not present

## 2015-05-18 DIAGNOSIS — G43919 Migraine, unspecified, intractable, without status migrainosus: Secondary | ICD-10-CM | POA: Diagnosis not present

## 2015-05-18 DIAGNOSIS — E785 Hyperlipidemia, unspecified: Secondary | ICD-10-CM

## 2015-05-18 DIAGNOSIS — F32A Depression, unspecified: Secondary | ICD-10-CM

## 2015-05-18 DIAGNOSIS — G4700 Insomnia, unspecified: Secondary | ICD-10-CM

## 2015-05-18 MED ORDER — RIZATRIPTAN BENZOATE 10 MG PO TBDP: 10.0000 mg | ORAL_TABLET | ORAL | Status: DC | PRN

## 2015-05-18 MED ORDER — DULOXETINE HCL 60 MG PO CPEP: 60.0000 mg | ORAL_CAPSULE | Freq: Two times a day (BID) | ORAL | Status: DC

## 2015-05-18 MED ORDER — TOPIRAMATE 100 MG PO TABS: 100.0000 mg | ORAL_TABLET | Freq: Every day | ORAL | Status: DC

## 2015-05-18 MED ORDER — ROSUVASTATIN CALCIUM 10 MG PO TABS: 10.0000 mg | ORAL_TABLET | Freq: Every day | ORAL | Status: DC

## 2015-05-18 MED ORDER — ESOMEPRAZOLE MAGNESIUM 40 MG PO CPDR: 40.0000 mg | DELAYED_RELEASE_CAPSULE | Freq: Every day | ORAL | Status: DC

## 2015-05-18 MED ORDER — ZOLPIDEM TARTRATE 10 MG PO TABS: 10.0000 mg | ORAL_TABLET | Freq: Every evening | ORAL | Status: DC | PRN

## 2015-05-18 NOTE — Progress Notes (Signed)
BP 128/79 mmHg  Pulse 74  Temp(Src) 98.4 F (36.9 C) (Oral)  Ht 5' 4"  (1.626 m)  Wt 152 lb 6.4 oz (69.128 kg)  BMI 26.15 kg/m2   Subjective:    Patient ID: Selena Barnes, female    DOB: [birth year], 65 y.o.   MRN: [id number]  HPI: Selena Barnes is a 65 y.o. female presenting on 05/18/2015 for Hyperlipidemia; Depression; and Medication refills   HPI Hyperlipidemia  patient is coming in for cholesterol recheck. He is currently on Crestor 10 mg. He is also on fish oil 4 g daily. He is due for recheck today. Patient denies headaches, blurred vision, chest pains, shortness of breath, or weakness. Denies any side effects from medication and is content with current medication.   GERD Patient needs refill Nexium is doing well on it. Denies any issues with that currently.  Depression Patient felt like depression was doing really well and started not taking Cymbalta is frequently but since that time has felt like the depression and anxiety has not been doing well at all and would like to be back on the Cymbalta. She denies any suicidal ideations or thoughts of hurting herself. The anxiety and depression have been building up to the point where it is starting to affect her day-to-day life.  Migraine/headache patient has been on Topamax and Maxalt for migraines which is mostly being managed by neurology. Patient would like a refill today. She has been mostly controlled on these medications.  Relevant past medical, surgical, family and social history reviewed and updated as indicated. Interim medical history since our last visit reviewed. Allergies and medications reviewed and updated.  Review of Systems  Constitutional: Negative for fever and chills.  HENT: Negative for congestion, ear discharge and ear pain.   Eyes: Negative for redness and visual disturbance.  Respiratory: Negative for chest tightness and shortness of breath.   Cardiovascular: Negative for chest pain, palpitations and leg  swelling.  Genitourinary: Negative for dysuria and difficulty urinating.  Musculoskeletal: Negative for back pain, arthralgias and gait problem.  Skin: Negative for rash.  Neurological: Negative for dizziness, light-headedness and headaches.  Psychiatric/Behavioral: Negative for behavioral problems and agitation.  All other systems reviewed and are negative.   Per HPI unless specifically indicated above     Medication List       This list is accurate as of: 05/18/15  4:05 PM.  Always use your most recent med list.               aspirin 81 MG tablet  Take 81 mg by mouth daily.     CITRACAL SLOW RELEASE PO  Take 1,200 mg by mouth 2 (two) times daily before a meal.     diclofenac 50 MG tablet  Commonly known as:  CATAFLAM  Take 1 tablet (50 mg total) by mouth 3 (three) times daily.     DULoxetine 60 MG capsule  Commonly known as:  CYMBALTA  Take 1 capsule (60 mg total) by mouth 2 (two) times daily.     esomeprazole 40 MG capsule  Commonly known as:  NEXIUM  Take 1 capsule (40 mg total) by mouth daily at 12 noon.     fish oil-omega-3 fatty acids 1000 MG capsule  Take 2 g by mouth 2 (two) times daily.     metaxalone 800 MG tablet  Commonly known as:  SKELAXIN  Take 1 tablet (800 mg total) by mouth 3 (three) times daily.  promethazine 25 MG tablet  Commonly known as:  PHENERGAN  Take 1 tablet (25 mg total) by mouth every 6 (six) hours as needed for nausea or vomiting.     raloxifene 60 MG tablet  Commonly known as:  EVISTA  Take 1 tablet (60 mg total) by mouth daily.     rizatriptan 10 MG disintegrating tablet  Commonly known as:  MAXALT-MLT  Take 1 tablet (10 mg total) by mouth as needed for migraine. May repeat in 2 hours if needed     rosuvastatin 10 MG tablet  Commonly known as:  CRESTOR  Take 1 tablet (10 mg total) by mouth daily.     topiramate 100 MG tablet  Commonly known as:  TOPAMAX  Take 1 tablet (100 mg total) by mouth daily.     Vitamin D3  2000 units capsule  Take 2,000 Units by mouth 2 (two) times daily.     zolpidem 10 MG tablet  Commonly known as:  AMBIEN  Take 1 tablet (10 mg total) by mouth at bedtime as needed for sleep.           Objective:    BP 128/79 mmHg  Pulse 74  Temp(Src) 98.4 F (36.9 C) (Oral)  Ht 5' 4"  (1.626 m)  Wt 152 lb 6.4 oz (69.128 kg)  BMI 26.15 kg/m2  Wt Readings from Last 3 Encounters:  05/18/15 152 lb 6.4 oz (69.128 kg)  02/05/15 148 lb (67.132 kg)  02/03/15 149 lb (67.586 kg)    Physical Exam  Constitutional: She is oriented to person, place, and time. She appears well-developed and well-nourished. No distress.  Eyes: Conjunctivae and EOM are normal. Pupils are equal, round, and reactive to light.  Neck: Neck supple. No thyromegaly present.  Cardiovascular: Normal rate, regular rhythm, normal heart sounds and intact distal pulses.   No murmur heard. Pulmonary/Chest: Effort normal and breath sounds normal. No respiratory distress. She has no wheezes.  Musculoskeletal: Normal range of motion. She exhibits no edema or tenderness.  Lymphadenopathy:    She has no cervical adenopathy.  Neurological: She is alert and oriented to person, place, and time. Coordination normal.  Skin: Skin is warm and dry. No rash noted. She is not diaphoretic. No erythema.  Psychiatric: She has a normal mood and affect. Her behavior is normal.  Nursing note and vitals reviewed.   Results for orders placed or performed in visit on 02/05/15  VITAMIN D 25 Hydroxy (Vit-D Deficiency, Fractures)  Result Value Ref Range   Vit D, 25-Hydroxy 48.7 30.0 - 100.0 ng/mL      Assessment & Plan:       Problem List Items Addressed This Visit      Cardiovascular and Mediastinum   Migraines   Relevant Medications   DULoxetine (CYMBALTA) 60 MG capsule   rosuvastatin (CRESTOR) 10 MG tablet   rizatriptan (MAXALT-MLT) 10 MG disintegrating tablet   topiramate (TOPAMAX) 100 MG tablet     Digestive   GERD  (gastroesophageal reflux disease)   Relevant Medications   esomeprazole (NEXIUM) 40 MG capsule     Other   Depression   Relevant Medications   DULoxetine (CYMBALTA) 60 MG capsule   Other Relevant Orders   CMP14+EGFR   CBC with Differential/Platelet   Hyperlipidemia with target LDL less than 100 - Primary   Relevant Medications   rosuvastatin (CRESTOR) 10 MG tablet   Other Relevant Orders   CMP14+EGFR   Lipid panel       Follow up  plan: Return in about 6 months (around 11/17/2015), or if symptoms worsen or fail to improve, for Recheck cholesterol and depression.  Counseling provided for all of the vaccine components Orders Placed This Encounter  Procedures  . CMP14+EGFR  . Lipid panel  . CBC with Differential/Platelet    Caryl Pina, MD [facility] 05/18/2015, 4:05 PM

## 2015-06-02 ENCOUNTER — Other Ambulatory Visit: Payer: Medicare Other

## 2015-06-02 DIAGNOSIS — F329 Major depressive disorder, single episode, unspecified: Secondary | ICD-10-CM | POA: Diagnosis not present

## 2015-06-02 DIAGNOSIS — E785 Hyperlipidemia, unspecified: Secondary | ICD-10-CM | POA: Diagnosis not present

## 2015-06-02 DIAGNOSIS — F32A Depression, unspecified: Secondary | ICD-10-CM

## 2015-06-03 LAB — LIPID PANEL
Chol/HDL Ratio: 2 ratio units (ref 0.0–4.4)
Cholesterol, Total: 120 mg/dL (ref 100–199)
HDL: 59 mg/dL (ref >39)
LDL Calculated: 45 mg/dL (ref 0–99)
Triglycerides: 79 mg/dL (ref 0–149)
VLDL Cholesterol Cal: 16 mg/dL (ref 5–40)

## 2015-06-03 LAB — CBC WITH DIFFERENTIAL/PLATELET
Basophils Absolute: 0.1 10*3/uL (ref 0.0–0.2)
Basos: 1 %
EOS (ABSOLUTE): 0.3 10*3/uL (ref 0.0–0.4)
Eos: 4 %
Hematocrit: 36 % (ref 34.0–46.6)
Hemoglobin: 12.2 g/dL (ref 11.1–15.9)
Immature Grans (Abs): 0 10*3/uL (ref 0.0–0.1)
Immature Granulocytes: 0 %
Lymphocytes Absolute: 2.5 10*3/uL (ref 0.7–3.1)
Lymphs: 41 %
MCH: 30.7 pg (ref 26.6–33.0)
MCHC: 33.9 g/dL (ref 31.5–35.7)
MCV: 91 fL (ref 79–97)
Monocytes Absolute: 0.4 10*3/uL (ref 0.1–0.9)
Monocytes: 6 %
Neutrophils Absolute: 2.9 10*3/uL (ref 1.4–7.0)
Neutrophils: 48 %
Platelets: 235 10*3/uL (ref 150–379)
RBC: 3.98 x10E6/uL (ref 3.77–5.28)
RDW: 12.8 % (ref 12.3–15.4)
WBC: 6.1 10*3/uL (ref 3.4–10.8)

## 2015-06-03 LAB — CMP14+EGFR
ALT: 16 IU/L (ref 0–32)
AST: 17 IU/L (ref 0–40)
Albumin/Globulin Ratio: 1.5 (ref 1.2–2.2)
Albumin: 3.9 g/dL (ref 3.6–4.8)
Alkaline Phosphatase: 48 IU/L (ref 39–117)
BUN/Creatinine Ratio: 15 (ref 12–28)
BUN: 10 mg/dL (ref 8–27)
Bilirubin Total: 0.2 mg/dL (ref 0.0–1.2)
CO2: 27 mmol/L (ref 18–29)
Calcium: 9.2 mg/dL (ref 8.7–10.3)
Chloride: 101 mmol/L (ref 96–106)
Creatinine, Ser: 0.66 mg/dL (ref 0.57–1.00)
GFR calc Af Amer: 108 mL/min/{1.73_m2} (ref >59)
GFR calc non Af Amer: 94 mL/min/{1.73_m2} (ref >59)
Globulin, Total: 2.6 g/dL (ref 1.5–4.5)
Glucose: 88 mg/dL (ref 65–99)
Potassium: 4.1 mmol/L (ref 3.5–5.2)
Sodium: 142 mmol/L (ref 134–144)
Total Protein: 6.5 g/dL (ref 6.0–8.5)

## 2015-06-08 ENCOUNTER — Other Ambulatory Visit: Payer: Self-pay

## 2015-06-08 DIAGNOSIS — Z1231 Encounter for screening mammogram for malignant neoplasm of breast: Secondary | ICD-10-CM

## 2015-06-22 ENCOUNTER — Ambulatory Visit
Admission: RE | Admit: 2015-06-22 | Discharge: 2015-06-22 | Disposition: A | Discharge status: Home / Self Care | Payer: Medicare Other | Source: Ambulatory Visit

## 2015-06-22 DIAGNOSIS — Z1231 Encounter for screening mammogram for malignant neoplasm of breast: Secondary | ICD-10-CM

## 2015-06-30 ENCOUNTER — Other Ambulatory Visit: Payer: Self-pay | Admitting: Internal Medicine

## 2015-07-01 ENCOUNTER — Other Ambulatory Visit: Payer: Medicare Other

## 2015-07-01 DIAGNOSIS — Z1211 Encounter for screening for malignant neoplasm of colon: Secondary | ICD-10-CM

## 2015-07-04 LAB — FECAL OCCULT BLOOD, IMMUNOCHEMICAL: Fecal Occult Bld: POSITIVE — AB

## 2015-07-07 DIAGNOSIS — H2513 Age-related nuclear cataract, bilateral: Secondary | ICD-10-CM | POA: Diagnosis not present

## 2015-07-07 DIAGNOSIS — H40033 Anatomical narrow angle, bilateral: Secondary | ICD-10-CM | POA: Diagnosis not present

## 2015-07-22 ENCOUNTER — Encounter: Payer: Self-pay | Admitting: Gastroenterology

## 2015-07-22 ENCOUNTER — Ambulatory Visit (INDEPENDENT_AMBULATORY_CARE_PROVIDER_SITE_OTHER): Payer: Medicare Other | Admitting: Gastroenterology

## 2015-07-22 VITALS — BP 124/68 | HR 80 | Ht 62.75 in | Wt 151.5 lb

## 2015-07-22 DIAGNOSIS — K5901 Slow transit constipation: Secondary | ICD-10-CM | POA: Diagnosis not present

## 2015-07-22 DIAGNOSIS — K625 Hemorrhage of anus and rectum: Secondary | ICD-10-CM

## 2015-07-22 DIAGNOSIS — K219 Gastro-esophageal reflux disease without esophagitis: Secondary | ICD-10-CM | POA: Diagnosis not present

## 2015-07-22 NOTE — Patient Instructions (Addendum)
If you are age 64 or older, your body mass index should be between 23-30. Your Body mass index is 27.05 kg/(m^2). If this is out of the aforementioned range listed, please consider follow up with your Primary Care Provider.  If you are age 81 or younger, your body mass index should be between 19-25. Your Body mass index is 27.05 kg/(m^2). If this is out of the aformentioned range listed, please consider follow up with your Primary Care Provider.   You have been scheduled for a colonoscopy/EGD. Please follow written instructions given to you at your visit today.  Please pick up your prep supplies at the pharmacy within the next 1-3 days. If you use inhalers (even only as needed), please bring them with you on the day of your procedure. Your physician has requested that you go to www.startemmi.com and enter the access code given to you at your visit today. This web site gives a general overview about your procedure. However, you should still follow specific instructions given to you by our office regarding your preparation for the procedure.  Constipation:  Increase your intake of water, fruits/vegetables and your activity level.  Ducosate stool softener, 100 mg twice daily. If not improved in 1 week after starting that, please add:  Miralax powder  1 capful in a glass of water or juice once daily.  Thank you for choosing [facility]  Dr Wilfrid Lund III

## 2015-07-22 NOTE — Progress Notes (Signed)
[facility] Consult Note:  History: Selena Barnes 07/22/2015  Referring physician: Fransisca Kaufmann Dettinger, MD  Reason for consult/chief complaint: Rectal Bleeding; heme poasitive stool test; Gastroesophageal Reflux; and Hemorrhoids   Subjective HPI:  This is Selena Barnes's first clinic visit with me. She was referred by her physician noted above for several GI complaints. She has long-standing GERD for which she has taken Nexium once a day,, typically at noon. Despite that, she has persistent pyrosis without regurgitation dysphagia or weight loss. For many years she has tended toward constipation and has taken fiber supplement and prune juice without much improvement. In the last 6 months she has had episodes one or 2 times a month of lower abdominal pain and rectal bleeding. She typically has a BM about every other day with thin stools and the need to strain. She had a colonoscopy with Dr. Deatra Ina of February 2008 for rectal bleeding, and then shortly thereafter had a sigmoidoscopy for internal hemorrhoid banding. An EGD at that time was normal.   ROS:  Review of Systems  Constitutional: Negative for appetite change and unexpected weight change.  HENT: Negative for mouth sores and voice change.   Eyes: Negative for pain and redness.  Respiratory: Negative for cough and shortness of breath.   Cardiovascular: Negative for chest pain and palpitations.  Genitourinary: Negative for dysuria and hematuria.  Musculoskeletal: Negative for myalgias and arthralgias.  Skin: Negative for pallor and rash.  Neurological: Negative for weakness and headaches.  Hematological: Negative for adenopathy.     Past Medical History: Past Medical History  Diagnosis Date  . Migraines   . Hyperlipidemia   . GERD (gastroesophageal reflux disease)   . Insomnia   . Depression   . Osteopenia   . Arthritis      Past Surgical History: Past Surgical History  Procedure Laterality Date  . Abdominal  hysterectomy    . Cholecystectomy    . Breast biopsy Right   . Lymph node biopsy Left     left arm     Family History: Family History  Problem Relation Age of Onset  . Heart disease Mother   . Heart disease Father   . Kidney disease Father   . Diabetes Brother   . Diabetes Grandchild     grandson    Social History: Social History   Social History  . Marital Status: Married    Spouse Name: N/A  . Number of Children: 1  . Years of Education: N/A   Occupational History  . retired    Social History Main Topics  . Smoking status: Never Smoker   . Smokeless tobacco: Never Used  . Alcohol Use: No  . Drug Use: No  . Sexual Activity: Not Asked   Other Topics Concern  . None   Social History Narrative    Allergies: No Known Allergies  Outpatient Meds: Current Outpatient Prescriptions  Medication Sig Dispense Refill  . aspirin 81 MG tablet Take 81 mg by mouth daily.    . Calcium-Magnesium-Vitamin D (CITRACAL SLOW RELEASE PO) Take 1,200 mg by mouth 2 (two) times daily before a meal.    . Cholecalciferol (VITAMIN D3) 2000 UNITS capsule Take 2,000 Units by mouth 2 (two) times daily.    . diclofenac (CATAFLAM) 50 MG tablet Take 1 tablet (50 mg total) by mouth 3 (three) times daily. 270 tablet 3  . DULoxetine (CYMBALTA) 60 MG capsule Take 1 capsule (60 mg total) by mouth 2 (two) times daily. 90 capsule 3  .  esomeprazole (NEXIUM) 40 MG capsule Take 1 capsule (40 mg total) by mouth daily at 12 noon. 90 capsule 3  . fish oil-omega-3 fatty acids 1000 MG capsule Take 2 g by mouth 2 (two) times daily.     . metaxalone (SKELAXIN) 800 MG tablet Take 1 tablet (800 mg total) by mouth 3 (three) times daily. 270 tablet 3  . promethazine (PHENERGAN) 25 MG tablet Take 1 tablet (25 mg total) by mouth every 6 (six) hours as needed for nausea or vomiting. 30 tablet 3  . raloxifene (EVISTA) 60 MG tablet Take 1 tablet (60 mg total) by mouth daily. 90 tablet 3  . rizatriptan (MAXALT-MLT) 10  MG disintegrating tablet Take 1 tablet (10 mg total) by mouth as needed for migraine. May repeat in 2 hours if needed 18 tablet 3  . rosuvastatin (CRESTOR) 10 MG tablet Take 1 tablet (10 mg total) by mouth daily. 90 tablet 3  . topiramate (TOPAMAX) 100 MG tablet Take 1 tablet (100 mg total) by mouth daily. 90 tablet 3  . zolpidem (AMBIEN) 10 MG tablet Take 1 tablet (10 mg total) by mouth at bedtime as needed for sleep. 90 tablet 3   No current facility-administered medications for this visit.      ___________________________________________________________________ Objective  Exam:  BP 124/68 mmHg  Pulse 80  Ht 5' 2.75" (1.594 m)  Wt 151 lb 8 oz (68.72 kg)  BMI 27.05 kg/m2   General: this is a(n) Well-appearing woman with good muscle mass.   Eyes: sclera anicteric, no redness  ENT: oral mucosa moist without lesions, no cervical or supraclavicular lymphadenopathy, good dentition  CV: RRR without murmur, S1/S2, no JVD, no peripheral edema  Resp: clear to auscultation bilaterally, normal RR and effort noted  GI: soft, no tenderness, with active bowel sounds. No guarding or palpable organomegaly noted.  Skin; warm and dry, no rash or jaundice noted  Neuro: awake, alert and oriented x 3. Normal gross motor function and fluent speech  Labs:  Recent CBC and CMP normal  Assessment: Encounter Diagnoses  Name Primary?  . Rectal bleeding Yes  . Slow transit constipation   . Gastroesophageal reflux disease, esophagitis presence not specified     Her calcium supplement but may also be contributing to the constipation. Given its duration, obstruction seems less likely, but the rectal bleeding means we must rule out neoplasia. She  probably has recurrence of hemorrhoids or benign anal bleeding due to constipation. We also discussed nonpharmacologic anti-reflex measures. Plan:  EGD and colonoscopy  The benefits and risks of the planned procedure were described in detail with the  patient or (when appropriate) their health care proxy.  Risks were outlined as including, but not limited to, bleeding, infection, perforation, adverse medication reaction leading to cardiac or pulmonary decompensation, or pancreatitis (if ERCP).  The limitation of incomplete mucosal visualization was also discussed.  No guarantees or warranties were given.  Bowel regimen and structures given  Thank you for the courtesy of this consult.  Please call me with any questions or concerns.  Nelida Meuse III  CC: Worthy Rancher, MD

## 2015-07-27 ENCOUNTER — Encounter: Payer: Self-pay | Admitting: Gastroenterology

## 2015-07-27 ENCOUNTER — Ambulatory Visit (AMBULATORY_SURGERY_CENTER): Payer: Medicare Other | Admitting: Gastroenterology

## 2015-07-27 VITALS — BP 116/59 | HR 66 | Temp 98.0°F | Resp 14 | Ht 62.0 in | Wt 151.0 lb

## 2015-07-27 DIAGNOSIS — K625 Hemorrhage of anus and rectum: Secondary | ICD-10-CM

## 2015-07-27 DIAGNOSIS — F329 Major depressive disorder, single episode, unspecified: Secondary | ICD-10-CM | POA: Diagnosis not present

## 2015-07-27 DIAGNOSIS — K219 Gastro-esophageal reflux disease without esophagitis: Secondary | ICD-10-CM

## 2015-07-27 DIAGNOSIS — R195 Other fecal abnormalities: Secondary | ICD-10-CM | POA: Diagnosis not present

## 2015-07-27 MED ORDER — SODIUM CHLORIDE 0.9 % IV SOLN: 500.0000 mL | INTRAVENOUS | Status: DC

## 2015-07-27 NOTE — Op Note (Signed)
Selena Barnes Patient Name: Selena Barnes Procedure Date: 07/27/2015 2:07 PM MRN: [id number] Endoscopist: Swisher. Loletha Carrow , MD Age: 65 Referring MD:  Date of Birth: 12-02-1950 Gender: Female Procedure:                Upper GI endoscopy Indications:              Gastro-esophageal reflux disease Medicines:                Monitored Anesthesia Care Procedure:                Pre-Anesthesia Assessment:                           - Prior to the procedure, a History and Physical                            was performed, and patient medications and                            allergies were reviewed. The patient's tolerance of                            previous anesthesia was also reviewed. The risks                            and benefits of the procedure and the sedation                            options and risks were discussed with the patient.                            All questions were answered, and informed consent                            was obtained. Prior Anticoagulants: The patient has                            taken no previous anticoagulant or antiplatelet                            agents. ASA Grade Assessment: II - A patient with                            mild systemic disease. After reviewing the risks                            and benefits, the patient was deemed in                            satisfactory condition to undergo the procedure.                           After obtaining informed consent, the endoscope was  passed under direct vision. Throughout the                            procedure, the patient's blood pressure, pulse, and                            oxygen saturations were monitored continuously. The                            Model GIF-HQ190 [id number]) scope was introduced                            through the mouth, and advanced to the second part                            of duodenum. The upper GI endoscopy was                         accomplished without difficulty. The patient                            tolerated the procedure well. Scope In: Scope Out: Findings:                 The esophagus was normal.                           The stomach was normal.                           The cardia and gastric fundus were normal on                            retroflexion.                           The examined duodenum was normal. Complications:            No immediate complications. Estimated Blood Loss:     Estimated blood loss: none. Impression:               - Normal esophagus.                           - Normal stomach.                           - Normal examined duodenum.                           - No specimens collected. Recommendation:           - Patient has a contact number available for                            emergencies. The signs and symptoms of potential                            delayed complications were  discussed with the                            patient. Return to normal activities tomorrow.                            Written discharge instructions were provided to the                            patient.                           - Resume previous diet.                           - Continue present medications.                           - Non-pharmacologic anti-reflux measures. Eugena Rhue L. Loletha Carrow, MD 07/27/2015 2:57:32 PM This report has been signed electronically.

## 2015-07-27 NOTE — Progress Notes (Signed)
Report to PACU, RN, vss, BBS= Clear.  

## 2015-07-27 NOTE — Op Note (Signed)
[place] Patient Name: Selena Barnes Procedure Date: 07/27/2015 2:07 PM MRN: [id number] Endoscopist: [place]. Loletha Carrow , MD Age: 65 Referring MD:  Date of Birth: 01-02-1951 Gender: Female Procedure:                Colonoscopy Indications:              Rectal bleeding Medicines:                Monitored Anesthesia Care Procedure:                Pre-Anesthesia Assessment:                           - Prior to the procedure, a History and Physical                            was performed, and patient medications and                            allergies were reviewed. The patient's tolerance of                            previous anesthesia was also reviewed. The risks                            and benefits of the procedure and the sedation                            options and risks were discussed with the patient.                            All questions were answered, and informed consent                            was obtained. Prior Anticoagulants: The patient has                            taken no previous anticoagulant or antiplatelet                            agents. ASA Grade Assessment: II - A patient with                            mild systemic disease. After reviewing the risks                            and benefits, the patient was deemed in                            satisfactory condition to undergo the procedure.                           - Prior to the procedure, a History and Physical  was performed, and patient medications and                            allergies were reviewed. The patient's tolerance of                            previous anesthesia was also reviewed. The risks                            and benefits of the procedure and the sedation                            options and risks were discussed with the patient.                            All questions were answered, and informed consent   was obtained. Prior Anticoagulants: The patient has                            taken no previous anticoagulant or antiplatelet                            agents. ASA Grade Assessment: II - A patient with                            mild systemic disease. After reviewing the risks                            and benefits, the patient was deemed in                            satisfactory condition to undergo the procedure.                           After obtaining informed consent, the colonoscope                            was passed under direct vision. Throughout the                            procedure, the patient's blood pressure, pulse, and                            oxygen saturations were monitored continuously. The                            Model CF-HQ190L [id number]) scope was introduced                            through the anus and advanced to the the cecum,                            identified by appendiceal orifice and ileocecal  valve. The colonoscopy was performed without                            difficulty. The patient tolerated the procedure                            well. The quality of the bowel preparation was                            excellent. The ileocecal valve, appendiceal                            orifice, and rectum were photographed. The bowel                            preparation used was Miralax. Scope In: 2:39:38 PM Scope Out: 2:51:39 PM Scope Withdrawal Time: 0 hours 6 minutes 53 seconds  Total Procedure Duration: 0 hours 12 minutes 1 second  Findings:                 The perianal and digital rectal examinations were                            normal.                           Internal hemorrhoids were found during                            retroflexion. The hemorrhoids were medium-sized and                            Grade I (internal hemorrhoids that do not prolapse).                           The exam was otherwise  without abnormality on                            direct and retroflexion views. Complications:            No immediate complications. Estimated Blood Loss:     Estimated blood loss: none. Impression:               - Internal hemorrhoids.                           - The examination was otherwise normal on direct                            and retroflexion views.                           - No specimens collected. Recommendation:           - Patient has a contact number available for                            emergencies.  The signs and symptoms of potential                            delayed complications were discussed with the                            patient. Return to normal activities tomorrow.                            Written discharge instructions were provided to the                            patient.                           - Resume previous diet.                           - Continue present medications.                           - Repeat colonoscopy in 10 years for screening                            purposes.                           - Schedule in-office hemorrhoidal banding. Henry L. Loletha Carrow, MD 07/27/2015 3:00:36 PM This report has been signed electronically.

## 2015-07-27 NOTE — Patient Instructions (Signed)
Discharge instructions given. Handouts on hemorrhoids and a high fiber diet. Resume previous medications. Office will schedule hemorrhoidal banding. YOU HAD AN ENDOSCOPIC PROCEDURE TODAY AT [facility]:   Refer to the procedure report that was given to you for any specific questions about what was found during the examination.  If the procedure report does not answer your questions, please call your gastroenterologist to clarify.  If you requested that your care partner not be given the details of your procedure findings, then the procedure report has been included in a sealed envelope for you to review at your convenience later.  YOU SHOULD EXPECT: Some feelings of bloating in the abdomen. Passage of more gas than usual.  Walking can help get rid of the air that was put into your GI tract during the procedure and reduce the bloating. If you had a lower endoscopy (such as a colonoscopy or flexible sigmoidoscopy) you may notice spotting of blood in your stool or on the toilet paper. If you underwent a bowel prep for your procedure, you may not have a normal bowel movement for a few days.  Please Note:  You might notice some irritation and congestion in your nose or some drainage.  This is from the oxygen used during your procedure.  There is no need for concern and it should clear up in a day or so.  SYMPTOMS TO REPORT IMMEDIATELY:   Following lower endoscopy (colonoscopy or flexible sigmoidoscopy):  Excessive amounts of blood in the stool  Significant tenderness or worsening of abdominal pains  Swelling of the abdomen that is new, acute  Fever of 100F or higher   Following upper endoscopy (EGD)  Vomiting of blood or coffee ground material  New chest pain or pain under the shoulder blades  Painful or persistently difficult swallowing  New shortness of breath  Fever of 100F or higher  Black, tarry-looking stools  For urgent or emergent issues, a gastroenterologist can be  reached at any hour by calling [phone].   DIET: Your first meal following the procedure should be a small meal and then it is ok to progress to your normal diet. Heavy or fried foods are harder to digest and may make you feel nauseous or bloated.  Likewise, meals heavy in dairy and vegetables can increase bloating.  Drink plenty of fluids but you should avoid alcoholic beverages for 24 hours.  ACTIVITY:  You should plan to take it easy for the rest of today and you should NOT DRIVE or use heavy machinery until tomorrow (because of the sedation medicines used during the test).    FOLLOW UP: Our staff will call the number listed on your records the next business day following your procedure to check on you and address any questions or concerns that you may have regarding the information given to you following your procedure. If we do not reach you, we will leave a message.  However, if you are feeling well and you are not experiencing any problems, there is no need to return our call.  We will assume that you have returned to your regular daily activities without incident.  If any biopsies were taken you will be contacted by phone or by letter within the next 1-3 weeks.  Please call us at [phone] if you have not heard about the biopsies in 3 weeks.    SIGNATURES/CONFIDENTIALITY: You and/or your care partner have signed paperwork which will be entered into your electronic medical record.  These signatures attest to the fact that that the information above on your After Visit Summary has been reviewed and is understood.  Full responsibility of the confidentiality of this discharge information lies with you and/or your care-partner.

## 2015-07-28 ENCOUNTER — Telehealth: Payer: Self-pay

## 2015-07-28 NOTE — Telephone Encounter (Signed)
  Follow up Call-  Call back number 07/27/2015  Post procedure Call Back phone  # [phone]  Permission to leave phone message Yes     Patient questions:  Do you have a fever, pain , or abdominal swelling? No. Pain Score  0 *  Have you tolerated food without any problems? Yes.    Have you been able to return to your normal activities? Yes.    Do you have any questions about your discharge instructions: Diet   No. Medications  No. Follow up visit  No.  Do you have questions or concerns about your Care? No.  Actions: * If pain score is 4 or above: No action needed, pain <4.

## 2015-09-28 ENCOUNTER — Ambulatory Visit (INDEPENDENT_AMBULATORY_CARE_PROVIDER_SITE_OTHER): Payer: Medicare Other | Admitting: Internal Medicine

## 2015-09-28 ENCOUNTER — Encounter: Payer: Self-pay | Admitting: Internal Medicine

## 2015-09-28 VITALS — BP 134/76 | HR 78 | Ht 62.75 in | Wt 147.1 lb

## 2015-09-28 DIAGNOSIS — K6289 Other specified diseases of anus and rectum: Secondary | ICD-10-CM | POA: Diagnosis not present

## 2015-09-28 DIAGNOSIS — K5909 Other constipation: Secondary | ICD-10-CM

## 2015-09-28 DIAGNOSIS — K648 Other hemorrhoids: Secondary | ICD-10-CM

## 2015-09-28 DIAGNOSIS — K644 Residual hemorrhoidal skin tags: Secondary | ICD-10-CM

## 2015-09-28 NOTE — Progress Notes (Signed)
   Subjective:    Patient ID: Selena Barnes, female    DOB: 1950-06-21, 65 y.o.   MRN: [id number] Cc: hemorrhoids HPI  65 yo ww w/ hx hemorrhoids and rectal bleeding - recent colonoscopy by Dr. Loletha Carrow. She presented to see about banding of hemorrhoids. Has sxs of chronic rectal ache ever since a hysterectomy many yrs ago. Has inrtermittent chronic rectal bleeding usu small vol and can go weeks w/o. Has a sense of a rectal bulge w/ defecation but does not push anything in and nothing retracts after defecation. Recently began stool softener and less bleeding. Tries not to have prolonged stool time but can > 3 mins. Some straining - better w/ stool softener. Has hx of prior hemorrhoid surgery/procedure many yrs ago - and has had thrombosed hemorrhoids in past. Medications, allergies, past medical history, past surgical history, family history and social history are reviewed and updated in the EMR.  Review of Systems As above    Objective:   Physical Exam BP 134/76 (BP Location: Left Arm, Patient Position: Sitting, Cuff Size: Normal)   Pulse 78   Ht 5' 2.75" (1.594 m)   Wt 147 lb 2 oz (66.7 kg)   BMI 26.27 kg/m  NAD  Patti Martinique, CMA present.  Rectal - moderate fleshy tags o/w NL anoderm Absent anal wink Decreased resting tone Small rectocele Decreased vol squueze Decreased descent w/ simulated defecation, NL abd CTR  anoscpy - Gr 1 inte/ext hemorrhoids No fissure    Assessment & Plan:   Encounter Diagnoses  Name Primary?  . Rectal pain Yes  . Other constipation   . Internal and external bleeding hemorrhoids     She seems to have symptomatic hemorrhoids with bleeding but her chronic rectal achiness is unlikely to be fixed by hemorrhoid banding. Her rectal exam suggests some abnormal defecation pattern and she does also have straining and constipation sxs and will evaluate with anorectal manometry - ? Needs pelvic PT - reserve hemorrhoid banding for refractory sxs if w/u and  possible pelvic PT do not resolve these issued.  I appreciate the opportunity to care for this patient. Cc: Wilfrid Lund, MD Worthy Rancher, MD

## 2015-09-28 NOTE — Patient Instructions (Addendum)
   You have been scheduled to have an anorectal manometry at [facility] on __8/23/17___ at __10:00AM___. Please arrive 30 minutes prior to your appointment time for registration (1st floor of the hospital-admissions).  Please make certain to use 1 Fleets enema 2 hours prior to coming for your appointment. You can purchase Fleets enemas from the laxative section at your drug store. You should not eat anything during the two hours prior to the procedure. You may take regular medications with small sips of water at least 2 hours prior to the study.  Anorectal manometry is a test performed to evaluate patients with constipation or fecal incontinence. This test measures the pressures of the anal sphincter muscles, the sensation in the rectum, and the neural reflexes that are needed for normal bowel movements.  THE PROCEDURE The test takes approximately 30 minutes to 1 hour. You will be asked to change into a hospital gown. A technician or nurse will explain the procedure to you, take a brief health history, and answer any questions you may have. The patient then lies on his or her left side. A small, flexible tube, about the size of a thermometer, with a balloon at the end is inserted into the rectum. The catheter is connected to a machine that measures the pressure. During the test, the small balloon attached to the catheter may be inflated in the rectum to assess the normal reflex pathways. The nurse or technician may also ask the person to squeeze, relax, and push at various times. The anal sphincter muscle pressures are measured during each of these maneuvers. To squeeze, the patient tightens the sphincter muscles as if trying to prevent anything from coming out. To push or bear down, the patient strains down as if trying to have a bowel movement.    Stay on your stool softner.    I appreciate the opportunity to care for you. Silvano Rusk, MD, [facility]

## 2015-09-29 ENCOUNTER — Encounter: Payer: Self-pay | Admitting: Internal Medicine

## 2015-10-06 ENCOUNTER — Encounter (HOSPITAL_COMMUNITY)
Admission: RE | Disposition: A | Discharge status: Home / Self Care | Payer: Self-pay | Source: Ambulatory Visit | Attending: Internal Medicine

## 2015-10-06 ENCOUNTER — Ambulatory Visit (HOSPITAL_COMMUNITY)
Admission: RE | Admit: 2015-10-06 | Discharge: 2015-10-06 | Disposition: A | Discharge status: Home / Self Care | Payer: Medicare Other | Source: Ambulatory Visit | Attending: Internal Medicine | Admitting: Internal Medicine

## 2015-10-06 DIAGNOSIS — K6289 Other specified diseases of anus and rectum: Secondary | ICD-10-CM

## 2015-10-06 DIAGNOSIS — K5900 Constipation, unspecified: Secondary | ICD-10-CM | POA: Insufficient documentation

## 2015-10-06 DIAGNOSIS — K5902 Outlet dysfunction constipation: Secondary | ICD-10-CM

## 2015-10-06 HISTORY — PX: ANAL RECTAL MANOMETRY: SHX6358

## 2015-10-06 SURGERY — MANOMETRY, ANORECTAL

## 2015-10-06 NOTE — Progress Notes (Signed)
Anal Manometry done per protocol . Pt tolerated well without difficulty or complication. Report to be sent to Dr. Marla Roe office.

## 2015-10-07 ENCOUNTER — Encounter (HOSPITAL_COMMUNITY): Payer: Self-pay | Admitting: Internal Medicine

## 2015-10-14 DIAGNOSIS — K5902 Outlet dysfunction constipation: Secondary | ICD-10-CM

## 2015-10-14 DIAGNOSIS — K6289 Other specified diseases of anus and rectum: Secondary | ICD-10-CM

## 2015-10-25 ENCOUNTER — Telehealth: Payer: Self-pay | Admitting: Family Medicine

## 2015-10-25 NOTE — Telephone Encounter (Signed)
Scheduled

## 2015-10-27 NOTE — Op Note (Signed)
Patient: Selena Barnes, Selena Barnes   [id number] Gender: Female Procedure: anorectal manometry   DOB: [birth year] Physician: Dr. Silverio Decamp   Height: 5 ft 2 in Operator: Tory Emerald RN   Weight:  Referring Physician: Dr. Carlean Purl     Examination Date: 10/06/2015    Resting  Normal Squeeze  Normal  Mean Sphincter Pressure(rectal ref.)(mmHg) 53.6  Max. Sphincter Pressure(rectal ref.)(mmHg) 85.2   Max. Sphincter Pressure(rectal ref.)(mmHg) 57.0  Max. Sphincter Pressure(abs. ref.)(mmHg) 91.3 164-233  Mean Sphincter Pressure(abs. ref.)(mmHg) 58.6  Duration of sustained squeeze(sec) 22.8 26-31  Max. Sphincter Pressure(abs. ref.)(mmHg) 62.0 61-83     Length of HPZ(cm) 3.3 3.8-4.5     Length verge to center(cm) 1.1             Push(attempted defecation)  Normal Balloon Inflation  Normal  Residual Anal Pressure(abs. ref.)(mmHg) 56.3 39-55 RAIR Present present  Percent anal relaxation(%) 4  First sensation(cc) 60   Intrarectal pressure(mmHg) 7.2 34-55 Urge to defecate(cc) 100      Discomfort(cc) 130       Procedure   3D anorectal manometry catheter placed without difficulty after digital exam was performed.  Procedure performed without difficulty and with patient's full cooperation.  Catheter was removed and patient denied pain or discomfort.    Balloon expulsion test performed.  Patient unable to expel balloon within three minutes.    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

## 2015-11-03 ENCOUNTER — Telehealth: Payer: Self-pay | Admitting: Gastroenterology

## 2015-11-03 DIAGNOSIS — M6289 Other specified disorders of muscle: Secondary | ICD-10-CM

## 2015-11-03 NOTE — Telephone Encounter (Signed)
I reviewed the report of the anorectal motility study.  It shows that the normal neuro-muscular function of the muscles of defecation just do not function like they should.  This is why she has difficulty with constipation and rectal fullness. It typically occurs in women, perhaps as a result of prior childbirth, age or pelvic surgery.  At any rate, it is not know to respond to particular medicines or surgery.  Pelvic physical therapy and biofeedback training can help.    If she is interested in that, please refer to the [facility] health clinic that does that.  She saw Dr Carlean Purl for possible hemorrhoidal banding, but he held off pending this evaluation.  If this therapy improved constipation, perhaps the bleeding will improve enough that banding my not be necessary.  If it does not, banding is still available from [place].

## 2015-11-03 NOTE — Telephone Encounter (Signed)
Left message for patient to call back Referral placed for [facility]

## 2015-11-04 NOTE — Telephone Encounter (Signed)
Patient notified of recommendations and she has already been scheduled for [facility] for 11/18/15

## 2015-11-08 ENCOUNTER — Ambulatory Visit (INDEPENDENT_AMBULATORY_CARE_PROVIDER_SITE_OTHER): Payer: Medicare Other

## 2015-11-08 DIAGNOSIS — Z23 Encounter for immunization: Secondary | ICD-10-CM | POA: Diagnosis not present

## 2015-11-10 ENCOUNTER — Ambulatory Visit (INDEPENDENT_AMBULATORY_CARE_PROVIDER_SITE_OTHER): Payer: Medicare Other | Admitting: *Deleted

## 2015-11-10 VITALS — BP 139/75 | HR 70 | Ht 63.0 in | Wt 148.0 lb

## 2015-11-10 DIAGNOSIS — Z23 Encounter for immunization: Secondary | ICD-10-CM

## 2015-11-10 DIAGNOSIS — Z0000 Encounter for general adult medical examination without abnormal findings: Secondary | ICD-10-CM

## 2015-11-10 NOTE — Patient Instructions (Addendum)
Selena Barnes , Thank you for taking time to come for your Medicare Wellness Visit. I appreciate your ongoing commitment to your health goals. Please review the following plan we discussed and let me know if I can assist you in the future.   These are the goals we discussed: Continue to exercise 3 times per week. Try to eat at 2-3 meals per day.   This is a list of the screening recommended for you and due dates:  Health Maintenance  Topic Date Due  .  Hepatitis C: One time screening is recommended by Center for Disease Control  (CDC) for  adults born from [birth year] through 1964.   04/15/1950  . Pap Smear  12/21/2015  . Tetanus Vaccine  05/14/2016  . Stool Blood Test  06/30/2016  . Mammogram  06/21/2017  . Colon Cancer Screening  07/26/2025  . Flu Shot  Completed  . Shingles Vaccine  Completed  . HIV Screening  Completed    Tdap Vaccine (Tetanus, Diphtheria and Pertussis): What You Need to Know 1. Why get vaccinated? Tetanus, diphtheria and pertussis are very serious diseases. Tdap vaccine can protect [place] from these diseases. And, Tdap vaccine given to pregnant women can protect newborn babies against pertussis. TETANUS (Lockjaw) is rare in the [place] today. It causes painful muscle tightening and stiffness, usually all over the body.  It can lead to tightening of muscles in the head and neck so you can't open your mouth, swallow, or sometimes even breathe. Tetanus kills about 1 out of 10 people who are infected even after receiving the best medical care. DIPHTHERIA is also rare in the [place] today. It can cause a thick coating to form in the back of the throat.  It can lead to breathing problems, heart failure, paralysis, and death. PERTUSSIS (Whooping Cough) causes severe coughing spells, which can cause difficulty breathing, vomiting and disturbed sleep.  It can also lead to weight loss, incontinence, and rib fractures. Up to 2 in 100 adolescents and 5 in 100 adults with  pertussis are hospitalized or have complications, which could include pneumonia or death. These diseases are caused by bacteria. Diphtheria and pertussis are spread from person to person through secretions from coughing or sneezing. Tetanus enters the body through cuts, scratches, or wounds. Before vaccines, as many as 200,000 cases of diphtheria, 200,000 cases of pertussis, and hundreds of cases of tetanus, were reported in the [place] each year. Since vaccination began, reports of cases for tetanus and diphtheria have dropped by about 99% and for pertussis by about 80%. 2. Tdap vaccine Tdap vaccine can protect adolescents and adults from tetanus, diphtheria, and pertussis. One dose of Tdap is routinely given at age 66 or 31. People who did not get Tdap at that age should get it as soon as possible. Tdap is especially important for healthcare professionals and anyone having close contact with a baby younger than 12 months. Pregnant women should get a dose of Tdap during every pregnancy, to protect the newborn from pertussis. Infants are most at risk for severe, life-threatening complications from pertussis. Another vaccine, called Td, protects against tetanus and diphtheria, but not pertussis. A Td booster should be given every 10 years. Tdap may be given as one of these boosters if you have never gotten Tdap before. Tdap may also be given after a severe cut or burn to prevent tetanus infection. Your doctor or the person giving you the vaccine can give you more information. Tdap may safely  be given at the same time as other vaccines. 3. Some people should not get this vaccine  A person who has ever had a life-threatening allergic reaction after a previous dose of any diphtheria, tetanus or pertussis containing vaccine, OR has a severe allergy to any part of this vaccine, should not get Tdap vaccine. Tell the person giving the vaccine about any severe allergies.  Anyone who had coma or long  repeated seizures within 7 days after a childhood dose of DTP or DTaP, or a previous dose of Tdap, should not get Tdap, unless a cause other than the vaccine was found. They can still get Td.  Talk to your doctor if you:  have seizures or another nervous system problem,  had severe pain or swelling after any vaccine containing diphtheria, tetanus or pertussis,  ever had a condition called Guillain-Barr Syndrome (GBS),  aren't feeling well on the day the shot is scheduled. 4. Risks With any medicine, including vaccines, there is a chance of side effects. These are usually mild and go away on their own. Serious reactions are also possible but are rare. Most people who get Tdap vaccine do not have any problems with it. Mild problems following Tdap (Did not interfere with activities)  Pain where the shot was given (about 3 in 4 adolescents or 2 in 3 adults)  Redness or swelling where the shot was given (about 1 person in 5)  Mild fever of at least 100.69F (up to about 1 in 25 adolescents or 1 in 100 adults)  Headache (about 3 or 4 people in 10)  Tiredness (about 1 person in 3 or 4)  Nausea, vomiting, diarrhea, stomach ache (up to 1 in 4 adolescents or 1 in 10 adults)  Chills, sore joints (about 1 person in 10)  Body aches (about 1 person in 3 or 4)  Rash, swollen glands (uncommon) Moderate problems following Tdap (Interfered with activities, but did not require medical attention)  Pain where the shot was given (up to 1 in 5 or 6)  Redness or swelling where the shot was given (up to about 1 in 16 adolescents or 1 in 12 adults)  Fever over 102F (about 1 in 100 adolescents or 1 in 250 adults)  Headache (about 1 in 7 adolescents or 1 in 10 adults)  Nausea, vomiting, diarrhea, stomach ache (up to 1 or 3 people in 100)  Swelling of the entire arm where the shot was given (up to about 1 in 500). Severe problems following Tdap (Unable to perform usual activities; required  medical attention)  Swelling, severe pain, bleeding and redness in the arm where the shot was given (rare). Problems that could happen after any vaccine:  People sometimes faint after a medical procedure, including vaccination. Sitting or lying down for about 15 minutes can help prevent fainting, and injuries caused by a fall. Tell your doctor if you feel dizzy, or have vision changes or ringing in the ears.  Some people get severe pain in the shoulder and have difficulty moving the arm where a shot was given. This happens very rarely.  Any medication can cause a severe allergic reaction. Such reactions from a vaccine are very rare, estimated at fewer than 1 in a million doses, and would happen within a few minutes to a few hours after the vaccination. As with any medicine, there is a very remote chance of a vaccine causing a serious injury or death. The safety of vaccines is always being monitored. For  more information, visit: http://www.aguilar.org/ 5. What if there is a serious problem? What should I look for?  Look for anything that concerns you, such as signs of a severe allergic reaction, very high fever, or unusual behavior.  Signs of a severe allergic reaction can include hives, swelling of the face and throat, difficulty breathing, a fast heartbeat, dizziness, and weakness. These would usually start a few minutes to a few hours after the vaccination. What should I do?  If you think it is a severe allergic reaction or other emergency that can't wait, call 9-1-1 or get the person to the nearest hospital. Otherwise, call your doctor.  Afterward, the reaction should be reported to the Vaccine Adverse Event Reporting System (VAERS). Your doctor might file this report, or you can do it yourself through the VAERS web site at www.vaers.SamedayNews.es, or by calling 586-382-3191. VAERS does not give medical advice.  6. The National Vaccine Injury Compensation Program The [employer] Vaccine  Injury Compensation Program (VICP) is a federal program that was created to compensate people who may have been injured by certain vaccines. Persons who believe they may have been injured by a vaccine can learn about the program and about filing a claim by calling 479-867-4365 or visiting the [place] website at GoldCloset.com.ee. There is a time limit to file a claim for compensation. 7. How can I learn more?  Ask your doctor. He or she can give you the vaccine package insert or suggest other sources of information.  Call your local or state health department.  Contact the Centers for Disease Control and Prevention (CDC):  Call 8724722997 (1-800-CDC-INFO) or  Visit CDC's website at http://hunter.com/ CDC Tdap Vaccine VIS (04/08/13)   This information is not intended to replace advice given to you by your health care provider. Make sure you discuss any questions you have with your health care provider.   Document Released: 08/01/2011 Document Revised: 02/20/2014 Document Reviewed: 05/14/2013 Elsevier Interactive Patient Education [employer].

## 2015-11-12 NOTE — Progress Notes (Signed)
Subjective:   Selena Barnes is a 65 y.o. female who presents for an Initial Medicare Annual Wellness Visit. She is retired from [employer] and lives at home with her husband and small dog. She has one child and two grandchildren. She enjoys doing puzzles and reading and they are actively involved with their church.    Review of Systems    Neurological: History of migraine headaches that are treated by a headache specialist. She had to retire early because they were debilitating.   GI: Constipation. Under care of gastroenterology, Dr Carlean Purl and taking stool softeners daily.   Cardiac Risk Factors include: dyslipidemia  Other systems negative.   Patient states that she believes her health is about the same as it was last year.    Objective:    Today's Vitals   11/10/15 1539  BP: 139/75  Pulse: 70  Weight: 148 lb (67.1 kg)  Height: 5\' 3"  (1.6 m)   Body mass index is 26.22 kg/m.   Current Medications (verified) Outpatient Encounter Prescriptions as of 11/10/2015  Medication Sig  . aspirin 81 MG tablet Take 81 mg by mouth daily.  . Calcium-Magnesium-Vitamin D (CITRACAL SLOW RELEASE PO) Take 1,200 mg by mouth 2 (two) times daily before a meal.  . Cholecalciferol (VITAMIN D3) 2000 UNITS capsule Take 2,000 Units by mouth 2 (two) times daily.  . diclofenac (CATAFLAM) 50 MG tablet Take 1 tablet (50 mg total) by mouth 3 (three) times daily.  . DULoxetine (CYMBALTA) 60 MG capsule Take 1 capsule (60 mg total) by mouth 2 (two) times daily.  Marland Kitchen esomeprazole (NEXIUM) 40 MG capsule Take 1 capsule (40 mg total) by mouth daily at 12 noon.  . fish oil-omega-3 fatty acids 1000 MG capsule Take 2 g by mouth 2 (two) times daily.   . metaxalone (SKELAXIN) 800 MG tablet Take 1 tablet (800 mg total) by mouth 3 (three) times daily.  . promethazine (PHENERGAN) 25 MG tablet Take 1 tablet (25 mg total) by mouth every 6 (six) hours as needed for nausea or vomiting.  . raloxifene (EVISTA) 60 MG  tablet Take 1 tablet (60 mg total) by mouth daily.  . rizatriptan (MAXALT-MLT) 10 MG disintegrating tablet Take 1 tablet (10 mg total) by mouth as needed for migraine. May repeat in 2 hours if needed  . rosuvastatin (CRESTOR) 10 MG tablet Take 1 tablet (10 mg total) by mouth daily.  Marland Kitchen topiramate (TOPAMAX) 100 MG tablet Take 1 tablet (100 mg total) by mouth daily.  Marland Kitchen zolpidem (AMBIEN) 10 MG tablet Take 1 tablet (10 mg total) by mouth at bedtime as needed for sleep. (Patient taking differently: Take 5 mg by mouth at bedtime as needed for sleep. )   No facility-administered encounter medications on file as of 11/10/2015.     Allergies (verified) Review of patient's allergies indicates no known allergies.   History: Past Medical History:  Diagnosis Date  . Arthritis   . Constipation   . Depression   . GERD (gastroesophageal reflux disease)   . Hyperlipidemia   . Insomnia   . Migraines   . Osteopenia    Past Surgical History:  Procedure Laterality Date  . ABDOMINAL HYSTERECTOMY    . ANAL RECTAL MANOMETRY N/A 10/06/2015   Procedure: ANO RECTAL MANOMETRY;  Surgeon: Gatha Mayer, MD;  Location: [facility];  Service: Endoscopy;  Laterality: N/A;  . BREAST BIOPSY Right   . CHOLECYSTECTOMY    . LYMPH NODE BIOPSY Left  left arm   Family History  Problem Relation Age of Onset  . Heart disease Mother   . Heart disease Father   . Kidney disease Father   . Diabetes Brother   . Diabetes Grandchild     grandson   Social History   Occupational History  . retired    Social History Main Topics  . Smoking status: Never Smoker  . Smokeless tobacco: Never Used  . Alcohol use No  . Drug use: No  . Sexual activity: Yes    Tobacco Counseling No tobacco use  Activities of Daily Living In your present state of health, do you have any difficulty performing the following activities: 11/10/2015  Hearing? N  Vision? N  Difficulty concentrating or making decisions? N  Walking or  climbing stairs? N  Dressing or bathing? N  Doing errands, shopping? N  Preparing Food and eating ? N  Using the Toilet? N  In the past six months, have you accidently leaked urine? N  Do you have problems with loss of bowel control? N  Managing your Medications? N  Managing your Finances? N  Housekeeping or managing your Housekeeping? N  Some recent data might be hidden    Immunizations and Health Maintenance Immunization History  Administered Date(s) Administered  . Influenza,inj,Quad PF,36+ Mos 12/20/2012, 01/22/2014, 11/13/2014, 11/08/2015  . Tdap 11/10/2015  . Zoster 06/20/2013   Health Maintenance Due  Topic Date Due  . Hepatitis C Screening  November 04, 1950    Patient Care Team: Worthy Rancher, MD as PCP - General (Family Medicine) Gatha Mayer, MD as Consulting Physician (Gastroenterology)      Assessment:   This is a routine wellness examination for [place].   Hearing/Vision screen No hearing or vision deficits noted. Patient wears glasses and is able to see well with them.  Dietary issues and exercise activities discussed: Current Exercise Habits: Structured exercise class, Type of exercise: stretching;strength training/weights, Time (Minutes): 60, Frequency (Times/Week): 3, Weekly Exercise (Minutes/Week): 180, Intensity: Moderate, Exercise limited by: None identified She also walks her dog daily.  Diet-typically eats 1-2 meals a day. She eats a mix of home prepared meals and restaurant prepared meals. Feels they are pretty healthy overall.   Goals -Continue to exercise at least 3 times per week. -Try to eat 3 meals per day  Depression Screen PHQ 2/9 Scores 11/10/2015 05/18/2015 02/03/2015 11/03/2014 05/21/2014 02/03/2014 01/22/2014  PHQ - 2 Score 0 1 0 0 0 0 0    Fall Risk Fall Risk  11/10/2015 05/18/2015 11/03/2014 05/21/2014 02/03/2014  Falls in the past year? No No Yes No No  Number falls in past yr: - - 1 - -  Injury with Fall? - - No - -    Cognitive  Function: MMSE - Mini Mental State Exam 11/10/2015 11/03/2014  Orientation to time 5 5  Orientation to Place 5 5  Registration 3 3  Attention/ Calculation 5 5  Recall 3 3  Language- name 2 objects 2 2  Language- repeat 1 1  Language- follow 3 step command 3 3  Language- read & follow direction 1 1  Write a sentence 1 1  Copy design 1 1  Total score 30 30    Screening Tests Health Maintenance  Topic Date Due  . Hepatitis C Screening  08-Dec-1950  . PAP SMEAR  12/21/2015  . COLON CANCER SCREENING ANNUAL FOBT  06/30/2016  . MAMMOGRAM  06/21/2017  . COLONOSCOPY  07/26/2025  . TETANUS/TDAP  11/09/2025  .  INFLUENZA VACCINE  Completed  . ZOSTAVAX  Completed  . HIV Screening  Completed      Plan:  Keep appt with Dr Dettinger on 12/28/15 for a pelvic exam.   During the course of the visit, Kristian was educated and counseled about the following appropriate screening and preventive services:   Vaccines-Tdap updated today, Influenza up to date, Pneumonia not indicated, shingles up to date  Colorectal cancer screening-up to date  Bone density screening-Due 01/2017  Diabetes screening-Screened with routine labs  Glaucoma screening-Yearly eye exams advised  Mammography-Done 06/2015  Nutrition counseling   Patient Instructions (the written plan) were given to the patient.    Chong Sicilian, RN  11/12/2015         I have reviewed and agree with the above AWV documentation.  Caryl Pina, MD [facility] 11/12/2015, 12:34 PM

## 2015-11-18 ENCOUNTER — Ambulatory Visit: Payer: Medicare Other | Attending: Internal Medicine | Admitting: Physical Therapy

## 2015-11-18 ENCOUNTER — Encounter: Payer: Self-pay | Admitting: Physical Therapy

## 2015-11-18 DIAGNOSIS — R278 Other lack of coordination: Secondary | ICD-10-CM | POA: Insufficient documentation

## 2015-11-18 DIAGNOSIS — M6281 Muscle weakness (generalized): Secondary | ICD-10-CM | POA: Diagnosis not present

## 2015-11-18 DIAGNOSIS — R293 Abnormal posture: Secondary | ICD-10-CM | POA: Diagnosis not present

## 2015-11-18 NOTE — Therapy (Signed)
[facility] [address] [place], [name], [address] Phone: [phone]   Fax:  [phone]  Physical Therapy Evaluation  Patient Details  Name: Selena Barnes MRN: [id number] Date of Birth: May 30, 1950 Referring Provider: Dr. Wilfrid Lund L.  Encounter Date: 11/18/2015      PT End of Session - 11/18/15 1319    Visit Number 1   Number of Visits 10   Date for PT Re-Evaluation 01/13/16   Authorization Type g-code 10th visit   PT Start Time 1230   PT Stop Time 1305   PT Time Calculation (min) 35 min   Activity Tolerance Patient tolerated treatment well   Behavior During Therapy [facility] for tasks assessed/performed      Past Medical History:  Diagnosis Date  . Arthritis   . Constipation   . Depression   . GERD (gastroesophageal reflux disease)   . Hyperlipidemia   . Insomnia   . Migraines   . Osteopenia     Past Surgical History:  Procedure Laterality Date  . ABDOMINAL HYSTERECTOMY    . ANAL RECTAL MANOMETRY N/A 10/06/2015   Procedure: ANO RECTAL MANOMETRY;  Surgeon: Gatha Mayer, MD;  Location: [facility];  Service: Endoscopy;  Laterality: N/A;  . BREAST BIOPSY Right   . CHOLECYSTECTOMY    . LYMPH NODE BIOPSY Left    left arm    There were no vitals filed for this visit.       Subjective Assessment - 11/18/15 1237    Subjective Patient reports she is constipated and not use the bathroom for weeks. Patient will sit for hours to have a bowel movement.  Patient has low back pain after a bowel movement.  Patient reports she had bleeding rectally. Patient had an abdominal hysterectomy and has had hemmorroids.    Patient Stated Goals be able to have a bowel movement with greater ease and less pain   Currently in Pain? Yes   Pain Score 3    Pain Location Rectum   Pain Orientation Mid   Pain Descriptors / Indicators Aching   Pain Type Chronic pain   Pain Onset More than a month ago   Pain Frequency  Intermittent   Aggravating Factors  bowel movement   Pain Relieving Factors baths   Multiple Pain Sites No            [facility] PT Assessment - 11/18/15 0001      Assessment   Medical Diagnosis N81.84 pelvic floor dysfunction   Referring Provider Dr. Wilfrid Lund L.   Onset Date/Surgical Date 02/13/74   Prior Therapy None     Precautions   Precautions None     Restrictions   Weight Bearing Restrictions No     Balance Screen   Has the patient fallen in the past 6 months No   Has the patient had a decrease in activity level because of a fear of falling?  No   Is the patient reluctant to leave their home because of a fear of falling?  No     Home Ecologist residence     Prior Function   Level of Independence Independent   Vocation Retired     Associate Professor   Overall Cognitive Status Within Functional Limits for tasks assessed     Observation/Other Assessments   Focus on Therapeutic Outcomes (FOTO)  40% limitation  goal is 30% limitation     Posture/Postural Control   Posture/Postural Control  Postural limitations   Postural Limitations Forward head;Rounded Shoulders;Increased lumbar lordosis;Increased thoracic kyphosis     ROM / Strength   AROM / PROM / Strength AROM;Strength     AROM   Overall AROM Comments lumbar extension decreased by 75%; bil. sidebending decreased by 50%     Strength   Overall Strength Comments abdominal strength is 2/5     Palpation   Spinal mobility T8-L5 decreased P-A mobility   SI assessment  decreased SI mobility on right   Palpation comment tenderness in abdominal area; tightness in abdominal scar                 Pelvic Floor Special Questions - 11/18/15 0001    Prior Pregnancies Yes   Number of Pregnancies 1   Number of Vaginal Deliveries 1   Currently Sexually Active Yes   Is this Painful No   Urinary Leakage Yes   Pad use 0   Activities that cause leaking Other   Other activities that  cause leaking after going to the bathroom and walk away from the commode   Fecal incontinence No   Caffeine beverages none   Falling out feeling (prolapse) No   Skin Integrity Intact   Pelvic Floor Internal Exam Patient confirms identication and approves PT to assess mucsle integrity   Exam Type Rectal   Palpation left coccygeus; left ischiococcygeus; left puborectalis   Strength fair squeeze, definite lift  has trouble coordinating the contraction; will beardown                  PT Education - 11/18/15 1318    Education provided Yes   Education Details diaphgramatic breathing; toileting   Person(s) Educated Patient   Methods Explanation;Demonstration;Verbal cues;Handout   Comprehension Returned demonstration;Verbalized understanding          PT Short Term Goals - 11/18/15 1321      PT SHORT TERM GOAL #1   Title independent with flexibility exercises   Time 4   Period Weeks   Status New     PT SHORT TERM GOAL #2   Title understand correct toileting technique to relax the pelvic floor   Time 4   Period Weeks   Status New     PT SHORT TERM GOAL #3   Title understand how to perform abdominal massage to facilitate a bowel movement   Time 4   Period Weeks   Status New     PT SHORT TERM GOAL #4   Title strain to have a bowel movement </= 25% due to relaxation of the pelvic floor   Time 4   Period Weeks   Status New     PT SHORT TERM GOAL #5   Title pain after a bowel movement decreased >/= 25%   Time 4   Period Weeks   Status New           PT Long Term Goals - 11/18/15 1323      PT LONG TERM GOAL #1   Title independent with HEP   Time 8   Period Weeks   Status New     PT LONG TERM GOAL #2   Title ability to have a bowel movement 1-2 times per week   Time 4   Period Weeks   Status New     PT LONG TERM GOAL #3   Title pain after a bowel movement decreased >/= 50%   Time 8   Period Weeks   Status New  PT LONG TERM GOAL #4   Title  straining to have a bowel movement decreased >/= 75% due to increased pelvic floor strength   Time 8   Period Weeks   Status New     PT LONG TERM GOAL #5   Title postural awareness to reduce stain on the pelvic floor and lumbar spine   Time 8   Period Weeks   Status New               Plan - 11/18/15 1319    Clinical Impression Statement Patient is a 65 year old female with constipation issues that is chronic.  Patient has a bowel movement every couple of weaks.  Pain intermittient pain level in lumbar a buttocks is 3/10 and will decrease over time.  Patient has hemmorroids. Palpable tenderness located in coccygeu, ischiococcygeus and puborectalis.  Pelvic floor strength is 2/5 but patient has difficulty  with coordination a contraction or bearing down.  Patient has reduction of sensation in the abdomen due to hysterectomy scar and decreased sense of muscle contraction.  Decreased mobility of hysterectomy scar.  Posture consists of increased thoracic kyphosis and increased lumbar lordosis. Patient is moderately complex evaluation due to an evolving condition and comorbidities such as abdominal hysterectomy, osteopenia, and migraines that could impact care provided. Patient will benefit from skilled therapy to improve muscle coordination and strength to not strain with bowel movements to reduce hemmorroids.    Rehab Potential Excellent   Clinical Impairments Affecting Rehab Potential None   PT Frequency 1x / week   PT Duration 8 weeks   PT Treatment/Interventions Cryotherapy;Electrical Stimulation;Ultrasound;Moist Heat;Therapeutic activities;Therapeutic exercise;Neuromuscular re-education;Patient/family education;Passive range of motion;Manual techniques;Dry needling;Energy conservation   PT Next Visit Plan abdominal massage; abdominal bracing; review toileting, hip stretches, soft tissue work   PT Home Exercise Plan progress as needed   Recommended Other Services None   Consulted and  Agree with Plan of Care Patient      Patient will benefit from skilled therapeutic intervention in order to improve the following deficits and impairments:     Visit Diagnosis: Muscle weakness (generalized) - Plan: PT plan of care cert/re-cert  Other lack of coordination - Plan: PT plan of care cert/re-cert  Abnormal posture - Plan: PT plan of care cert/re-cert      G-Codes - [id number]    Functional Assessment Tool Used FOTO score is 40% limitation  goal is 30% limitation   Functional Limitation Other PT primary   Other PT Primary Current Status [id number]) At least 40 percent but less than 60 percent impaired, limited or restricted   Other PT Primary Goal Status [id number]) At least 20 percent but less than 40 percent impaired, limited or restricted       Problem List Patient Active Problem List   Diagnosis Date Noted  . Constipation due to outlet dysfunction   . Rectal pain   . Osteopenia with high risk of fracture 02/05/2015  . BMI 26.0-26.9,adult 05/21/2014  . Heel spur 05/21/2014  . Migraines 12/20/2012  . Insomnia 12/20/2012  . Depression 12/20/2012  . GERD (gastroesophageal reflux disease) 12/20/2012  . Hyperlipidemia with target LDL less than 100 12/20/2012    Earlie Counts, PT 11/18/15 1:36 PM   [facility] [address] [place], [name], [address] Phone: [phone]   Fax:  [phone]  Name: EMILIE KASKO MRN: [id number] Date of Birth: 1950/02/25

## 2015-11-18 NOTE — Patient Instructions (Addendum)
Sitting    Sit comfortably. Allow body's muscles to relax. Place hands on belly. Inhale slowly and deeply for _3__ seconds, so hands move out. Then take __3_ seconds to exhale. Repeat _10__ times. Do _3__ times a day. Do before a bowel movement Copyright  VHI. All rights reserved.  Toileting Techniques for Bowel Movements (Defecation) Using your belly (abdomen) and pelvic floor muscles to have a bowel movement is usually instinctive.  Sometimes people can have problems with these muscles and have to relearn proper defecation (emptying) techniques.  If you have weakness in your muscles, organs that are falling out, decreased sensation in your pelvis, or ignore your urge to go, you may find yourself straining to have a bowel movement.  You are straining if you are: . holding your breath or taking in a huge gulp of air and holding it  . keeping your lips and jaw tensed and closed tightly . turning red in the face because of excessive pushing or forcing . developing or worsening your  hemorrhoids . getting faint while pushing . not emptying completely and have to defecate many times a day  If you are straining, you are actually making it harder for yourself to have a bowel movement.  Many people find they are pulling up with the pelvic floor muscles and closing off instead of opening the anus. Due to lack pelvic floor relaxation and coordination the abdominal muscles, one has to work harder to push the feces out.  Many people have never been taught how to defecate efficiently and effectively.  Notice what happens to your body when you are having a bowel movement.  While you are sitting on the toilet pay attention to the following areas: . Jaw and mouth position . Angle of your hips   . Whether your feet touch the ground or not . Arm placement  . Spine position . Waist . Belly tension . Anus (opening of the anal canal)  An Evacuation/Defecation Plan   Here are the 4 basic points:  1. Lean  forward enough for your elbows to rest on your knees 2. Support your feet on the floor or use a low stool if your feet don't touch the floor  3. Push out your belly as if you have swallowed a beach ball-you should feel a widening of your waist 4. Open and relax your pelvic floor muscles, rather than tightening around the anus      The following conditions my require modifications to your toileting posture:  . If you have had surgery in the past that limits your back, hip, pelvic, knee or ankle flexibility . Constipation   Your healthcare practitioner may make the following additional suggestions and adjustments:  1) Sit on the toilet  a) Make sure your feet are supported. b) Notice your hip angle and spine position-most people find it effective to lean forward or raise their knees, which can help the muscles around the anus to relax  c) When you lean forward, place your forearms on your thighs for support  2) Relax suggestions a) Breath deeply in through your nose and out slowly through your mouth as if you are smelling the flowers and blowing out the candles. b) To become aware of how to relax your muscles, contracting and releasing muscles can be helpful.  Pull your pelvic floor muscles in tightly by using the image of holding back gas, or closing around the anus (visualize making a circle smaller) and lifting the anus up and  in.  Then release the muscles and your anus should drop down and feel open. Repeat 5 times ending with the feeling of relaxation. c) Keep your pelvic floor muscles relaxed; let your belly bulge out. d) The digestive tract starts at the mouth and ends at the anal opening, so be sure to relax both ends of the tube.  Place your tongue on the roof of your mouth with your teeth separated.  This helps relax your mouth and will help to relax the anus at the same time.  3) Empty (defecation) a) Keep your pelvic floor and sphincter relaxed, then bulge your anal muscles.   Make the anal opening wide.  b) Stick your belly out as if you have swallowed a beach ball. c) Make your belly wall hard using your belly muscles while continuing to breathe. Doing this makes it easier to open your anus. d) Breath out and give a grunt (or try using other sounds such as ahhhh, shhhhh, ohhhh or grrrrrrr).  4) Finish a) As you finish your bowel movement, pull the pelvic floor muscles up and in.  This will leave your anus in the proper place rather than remaining pushed out and down. If you leave your anus pushed out and down, it will start to feel as though that is normal and give you incorrect signals about needing to have a bowel movement.    Earlie Counts, PT [facility] [address] [place] [address] [place], [place] [address]

## 2015-11-29 ENCOUNTER — Encounter: Payer: Self-pay | Admitting: Physical Therapy

## 2015-11-29 ENCOUNTER — Ambulatory Visit: Payer: Medicare Other | Admitting: Physical Therapy

## 2015-11-29 DIAGNOSIS — R293 Abnormal posture: Secondary | ICD-10-CM

## 2015-11-29 DIAGNOSIS — M6281 Muscle weakness (generalized): Secondary | ICD-10-CM

## 2015-11-29 DIAGNOSIS — R278 Other lack of coordination: Secondary | ICD-10-CM | POA: Diagnosis not present

## 2015-11-29 NOTE — Therapy (Signed)
[facility] [address] [place], [name], [address] Phone: [phone]   Fax:  [phone]  Physical Therapy Treatment  Patient Details  Name: Selena Barnes MRN: [id number] Date of Birth: April 20, 1950 Referring Provider: Dr. Wilfrid Lund L.  Encounter Date: 11/29/2015      PT End of Session - 11/29/15 1443    Visit Number 2   Number of Visits 10   Date for PT Re-Evaluation 01/13/16   Authorization Type g-code 10th visit   PT Start Time 1400   PT Stop Time 1443   PT Time Calculation (min) 43 min   Activity Tolerance Patient tolerated treatment well   Behavior During Therapy [facility] for tasks assessed/performed      Past Medical History:  Diagnosis Date  . Arthritis   . Constipation   . Depression   . GERD (gastroesophageal reflux disease)   . Hyperlipidemia   . Insomnia   . Migraines   . Osteopenia     Past Surgical History:  Procedure Laterality Date  . ABDOMINAL HYSTERECTOMY    . ANAL RECTAL MANOMETRY N/A 10/06/2015   Procedure: ANO RECTAL MANOMETRY;  Surgeon: Gatha Mayer, MD;  Location: [facility];  Service: Endoscopy;  Laterality: N/A;  . BREAST BIOPSY Right   . CHOLECYSTECTOMY    . LYMPH NODE BIOPSY Left    left arm    There were no vitals filed for this visit.      Subjective Assessment - 11/29/15 1401    Subjective My back is aching.  I just came back from vacation. Bowel movements are a little easier.  I do not feel like I am straining as much. I am straining 50% less. I time bleeding rectally while on vacation.    Patient Stated Goals be able to have a bowel movement with greater ease and less pain   Currently in Pain? Yes   Pain Score 10-Worst pain ever   Pain Location Back   Pain Orientation Lower   Pain Descriptors / Indicators Burning   Pain Type Chronic pain   Pain Onset More than a month ago   Pain Frequency Constant   Aggravating Factors  cough   Pain Relieving Factors ice    Multiple Pain Sites No                         [facility] Adult PT Treatment/Exercise - 11/29/15 0001      Manual Therapy   Manual Therapy Soft tissue mobilization;Myofascial release   Soft tissue mobilization abdominal tissue.; circular massage to improve perastalic movement f bowels, lifting of the transverse abdominus;    Myofascial Release release of the lower abdominal with separating tissue between bladder and rectum; tissue rolling of lumbar; release of the lateral abodminal restrictions in prone and quadruped                PT Education - 11/29/15 1442    Education provided Yes   Education Details stretches, lower abdominal contraction   Person(s) Educated Patient   Methods Explanation;Demonstration;Verbal cues;Handout   Comprehension Returned demonstration;Verbalized understanding          PT Short Term Goals - 11/29/15 1447      PT SHORT TERM GOAL #1   Title independent with flexibility exercises   Time 4   Period Weeks   Status On-going     PT SHORT TERM GOAL #2   Title understand correct toileting technique to  relax the pelvic floor   Time 4   Period Weeks   Status On-going     PT SHORT TERM GOAL #3   Title understand how to perform abdominal massage to facilitate a bowel movement   Time 4   Period Weeks   Status On-going     PT SHORT TERM GOAL #4   Title strain to have a bowel movement </= 25% due to relaxation of the pelvic floor   Time 4   Period Weeks   Status Achieved     PT SHORT TERM GOAL #5   Title pain after a bowel movement decreased >/= 25%   Time 4   Period Weeks   Status On-going           PT Long Term Goals - 11/18/15 1323      PT LONG TERM GOAL #1   Title independent with HEP   Time 8   Period Weeks   Status New     PT LONG TERM GOAL #2   Title ability to have a bowel movement 1-2 times per week   Time 4   Period Weeks   Status New     PT LONG TERM GOAL #3   Title pain after a bowel movement  decreased >/= 50%   Time 8   Period Weeks   Status New     PT LONG TERM GOAL #4   Title straining to have a bowel movement decreased >/= 75% due to increased pelvic floor strength   Time 8   Period Weeks   Status New     PT LONG TERM GOAL #5   Title postural awareness to reduce stain on the pelvic floor and lumbar spine   Time 8   Period Weeks   Status New               Plan - 11/29/15 1443    Clinical Impression Statement Patient was able to contract the lower abdominal area and feel it for first time.  Patient has increased tissue restrictions in mid abdominal area. Patient has increased tissue restriction of the lumbar fascia especially with tissue rolling.  After therapy patient had no back pain.  Patient reports she is straining to have a bowel movement less than when she started.  Patient will benefit from skilled therapy to reduce pain and improve muscle coordination.    Rehab Potential Excellent   Clinical Impairments Affecting Rehab Potential None   PT Frequency 1x / week   PT Duration 8 weeks   PT Treatment/Interventions Cryotherapy;Electrical Stimulation;Ultrasound;Moist Heat;Therapeutic activities;Therapeutic exercise;Neuromuscular re-education;Patient/family education;Passive range of motion;Manual techniques;Dry needling;Energy conservation   PT Next Visit Plan abdominal massage; abdominal bracing; review toileting, hip stretches, soft tissue work;    PT Home Exercise Plan progress as needed   Consulted and Agree with Plan of Care Patient      Patient will benefit from skilled therapeutic intervention in order to improve the following deficits and impairments:  Pain, Hypomobility, Decreased strength, Decreased mobility, Decreased range of motion, Increased fascial restricitons, Impaired flexibility, Decreased scar mobility, Decreased activity tolerance, Increased muscle spasms  Visit Diagnosis: Muscle weakness (generalized)  Other lack of  coordination  Abnormal posture     Problem List Patient Active Problem List   Diagnosis Date Noted  . Constipation due to outlet dysfunction   . Rectal pain   . Osteopenia with high risk of fracture 02/05/2015  . BMI 26.0-26.9,adult 05/21/2014  . Heel spur 05/21/2014  .  Migraines 12/20/2012  . Insomnia 12/20/2012  . Depression 12/20/2012  . GERD (gastroesophageal reflux disease) 12/20/2012  . Hyperlipidemia with target LDL less than 100 12/20/2012    Earlie Counts, PT 11/29/15 2:49 PM   [facility] [address] [place], [name], [address] Phone: [phone]   Fax:  [phone]  Name: Selena Barnes MRN: [id number] Date of Birth: 1950-03-29

## 2015-11-29 NOTE — Patient Instructions (Addendum)
Concentric Contraction With Pelvic Floor (Hook-Lying)    Lie with hips and knees bent. Squeeze ball between knees. Slowly inhale, and then exhale. Pull navel toward spine and tighten pelvic floor.hold 5 sec.  Relax. Repeat _10__ times. Do _1-2__ times a day.   Copyright  VHI. All rights reserved.    Hip Adductor: Wall Stretch   Start with legs together hold 1 min. Then bring thighs apart.  Lie on back with hips against wall, back of thighs on wall. Pull legs apart until stretch is felt in inner thighs. Hold __60_ seconds. Relax. Repeat _1__ times. Do _1__ times a day. Advanced: At end of stretch, rotate thighs outward.  Copyright  VHI. All rights reserved.  Supine Knee-to-Chest, Unilateral    Lie on back, hands clasped behind one knee. Pull knee in toward chest until a comfortable stretch is felt in lower back and buttocks. Hold _15__ seconds.  Repeat _2__ times per session. Do _1__ sessions per day.  Copyright  VHI. All rights reserved.  Back Hyperextension: Using Arms    Lying face down with arms bent, inhale. Then while exhaling, straighten arms. Hold __2__ seconds. Slowly return to starting position. Repeat __5__ times per set. Do _1___ sets per session. Do _1___ sessions per day. 1 Copyright  VHI. All rights reserved.  [facility] [address], [place], [place] [address] Phone # [phone] Fax [phone]

## 2015-12-06 ENCOUNTER — Ambulatory Visit: Payer: Medicare Other | Admitting: Physical Therapy

## 2015-12-06 DIAGNOSIS — R278 Other lack of coordination: Secondary | ICD-10-CM

## 2015-12-06 DIAGNOSIS — R293 Abnormal posture: Secondary | ICD-10-CM | POA: Diagnosis not present

## 2015-12-06 DIAGNOSIS — M6281 Muscle weakness (generalized): Secondary | ICD-10-CM | POA: Diagnosis not present

## 2015-12-06 NOTE — Patient Instructions (Signed)
Toileting Techniques for Bowel Movements (Defecation) Using your belly (abdomen) and pelvic floor muscles to have a bowel movement is usually instinctive.  Sometimes people can have problems with these muscles and have to relearn proper defecation (emptying) techniques.  If you have weakness in your muscles, organs that are falling out, decreased sensation in your pelvis, or ignore your urge to go, you may find yourself straining to have a bowel movement.  You are straining if you are: . holding your breath or taking in a huge gulp of air and holding it  . keeping your lips and jaw tensed and closed tightly . turning red in the face because of excessive pushing or forcing . developing or worsening your  hemorrhoids . getting faint while pushing . not emptying completely and have to defecate many times a day  If you are straining, you are actually making it harder for yourself to have a bowel movement.  Many people find they are pulling up with the pelvic floor muscles and closing off instead of opening the anus. Due to lack pelvic floor relaxation and coordination the abdominal muscles, one has to work harder to push the feces out.  Many people have never been taught how to defecate efficiently and effectively.  Notice what happens to your body when you are having a bowel movement.  While you are sitting on the toilet pay attention to the following areas: . Jaw and mouth position . Angle of your hips   . Whether your feet touch the ground or not . Arm placement  . Spine position . Waist . Belly tension . Anus (opening of the anal canal)  An Evacuation/Defecation Plan   Here are the 4 basic points:  1. Lean forward enough for your elbows to rest on your knees 2. Support your feet on the floor or use a low stool if your feet don't touch the floor  3. Push out your belly as if you have swallowed a beach ball-you should feel a widening of your waist 4. Open and relax your pelvic floor muscles,  rather than tightening around the anus      The following conditions my require modifications to your toileting posture:  . If you have had surgery in the past that limits your back, hip, pelvic, knee or ankle flexibility . Constipation   Your healthcare practitioner may make the following additional suggestions and adjustments:  1) Sit on the toilet  a) Make sure your feet are supported. b) Notice your hip angle and spine position-most people find it effective to lean forward or raise their knees, which can help the muscles around the anus to relax  c) When you lean forward, place your forearms on your thighs for support  2) Relax suggestions a) Breath deeply in through your nose and out slowly through your mouth as if you are smelling the flowers and blowing out the candles. b) To become aware of how to relax your muscles, contracting and releasing muscles can be helpful.  Pull your pelvic floor muscles in tightly by using the image of holding back gas, or closing around the anus (visualize making a circle smaller) and lifting the anus up and in.  Then release the muscles and your anus should drop down and feel open. Repeat 5 times ending with the feeling of relaxation. c) Keep your pelvic floor muscles relaxed; let your belly bulge out. d) The digestive tract starts at the mouth and ends at the anal opening, so be   sure to relax both ends of the tube.  Place your tongue on the roof of your mouth with your teeth separated.  This helps relax your mouth and will help to relax the anus at the same time.  3) Empty (defecation) a) Keep your pelvic floor and sphincter relaxed, then bulge your anal muscles.  Make the anal opening wide.  b) Stick your belly out as if you have swallowed a beach ball. c) Make your belly wall hard using your belly muscles while continuing to breathe. Doing this makes it easier to open your anus. d) Breath out and give a grunt (or try using other sounds such as  ahhhh, shhhhh, ohhhh or grrrrrrr).  4) Finish a) As you finish your bowel movement, pull the pelvic floor muscles up and in.  This will leave your anus in the proper place rather than remaining pushed out and down. If you leave your anus pushed out and down, it will start to feel as though that is normal and give you incorrect signals about needing to have a bowel movement.    Earlie Counts, PT [facility] [address] [place] [address] [place], [place] [address]   . Position yourself as shown, grabbing onto the feet or behind the knees; you should feel a gentle stretch.  2. Breathe in and allow the pelvic floor muscles to relax.  3. Hold this position for 2-3 minutes.    Lie down on your back and cross your right foot over your left knee. Pull the left leg toward your body until you feel a stretch in the right hip. Repeat on the other side. Hold 30 sec. 2 times each side.     While in a crawl position, slowly lower your buttocks towards your feet until a stretch is felt along your back and or buttocks. Hold 30 sec. 2 times.   [facility] [address], [place], [place] [address] Phone # [phone] Fax [phone]

## 2015-12-06 NOTE — Therapy (Signed)
[facility] [address] [place], [name], [address] Phone: [phone]   Fax:  [phone]  Physical Therapy Treatment  Patient Details  Name: Selena Barnes MRN: [id number] Date of Birth: March 24, 1950 Referring Provider: Dr. Wilfrid Lund L.  Encounter Date: 12/06/2015      PT End of Session - 12/06/15 1441    Visit Number 3   Number of Visits 10   Date for PT Re-Evaluation 01/13/16   Authorization Type g-code 10th visit   PT Start Time 1400   PT Stop Time 1440   PT Time Calculation (min) 40 min   Activity Tolerance Patient tolerated treatment well   Behavior During Therapy [facility] for tasks assessed/performed      Past Medical History:  Diagnosis Date  . Arthritis   . Constipation   . Depression   . GERD (gastroesophageal reflux disease)   . Hyperlipidemia   . Insomnia   . Migraines   . Osteopenia     Past Surgical History:  Procedure Laterality Date  . ABDOMINAL HYSTERECTOMY    . ANAL RECTAL MANOMETRY N/A 10/06/2015   Procedure: ANO RECTAL MANOMETRY;  Surgeon: Gatha Mayer, MD;  Location: [facility];  Service: Endoscopy;  Laterality: N/A;  . BREAST BIOPSY Right   . CHOLECYSTECTOMY    . LYMPH NODE BIOPSY Left    left arm    There were no vitals filed for this visit.      Subjective Assessment - 12/06/15 1408    Subjective My back feels good after therapy. My skin split form me doing the massage. Iam using neopsorin eith it.    Patient Stated Goals be able to have a bowel movement with greater ease and less pain   Currently in Pain? Yes   Pain Score 3    Pain Location Back   Pain Orientation Lower   Pain Descriptors / Indicators Burning   Pain Type Chronic pain   Pain Onset More than a month ago   Pain Frequency Constant   Aggravating Factors  cough   Pain Relieving Factors ice                      Pelvic Floor Special Questions - 12/06/15 0001    Pelvic Floor Internal  Exam Patient confirms identication and approves PT to assess mucsle integrity   Exam Type Rectal           [facility] Adult PT Treatment/Exercise - 12/06/15 0001      Exercises   Exercises Other Exercises   Other Exercises  sidely anal contraction with verbal cues and bearing down with verbal cues     Manual Therapy   Manual Therapy Soft tissue mobilization;Myofascial release;Internal Pelvic Floor   Soft tissue mobilization abdominal tissue.; circular massage to improve perastalic movement f bowels, lifting of the transverse abdominus;    Myofascial Release release of the lower abdominal with separating tissue between bladder and rectum; tissue rolling of lumbar; release of the lateral abodminal restrictions in prone and quadruped   Internal Pelvic Floor left sidely soft tissue work to left puborectalis, left ishciococcygeus, left occygeus, righ tpuborectalis                PT Education - 12/06/15 1441    Education provided Yes   Education Details stretches, toileting technique   Person(s) Educated Patient   Methods Explanation;Demonstration;Handout   Comprehension Verbalized understanding;Returned demonstration  PT Short Term Goals - 12/06/15 1446      PT SHORT TERM GOAL #1   Title independent with flexibility exercises   Time 4   Period Weeks   Status On-going  just learned them     PT SHORT TERM GOAL #2   Title understand correct toileting technique to relax the pelvic floor   Time 4   Period Weeks   Status On-going  jsut learned today     PT SHORT TERM GOAL #3   Title understand how to perform abdominal massage to facilitate a bowel movement   Time 4   Period Weeks   Status Achieved     PT SHORT TERM GOAL #4   Title strain to have a bowel movement </= 25% due to relaxation of the pelvic floor   Time 4   Period Weeks   Status Achieved     PT SHORT TERM GOAL #5   Title pain after a bowel movement decreased >/= 25%   Time 4   Period Weeks    Status On-going           PT Long Term Goals - 11/18/15 1323      PT LONG TERM GOAL #1   Title independent with HEP   Time 8   Period Weeks   Status New     PT LONG TERM GOAL #2   Title ability to have a bowel movement 1-2 times per week   Time 4   Period Weeks   Status New     PT LONG TERM GOAL #3   Title pain after a bowel movement decreased >/= 50%   Time 8   Period Weeks   Status New     PT LONG TERM GOAL #4   Title straining to have a bowel movement decreased >/= 75% due to increased pelvic floor strength   Time 8   Period Weeks   Status New     PT LONG TERM GOAL #5   Title postural awareness to reduce stain on the pelvic floor and lumbar spine   Time 8   Period Weeks   Status New               Plan - 12/06/15 1442    Clinical Impression Statement Patient continues to have decreased abdominal mobility.  Patient will tighten her pelvic floor when she is to relax and bear down.  Patient is able to contract the anal  canal 2 of the 10 times wiht verbal cues.  Patient reports reduction in abdominal pain.  Patient is wearing pants that does not place seem inot the midd abdominal.  Patient will benefit from skilled therapy to improve tissue mobility and pelvic floor coordination.    Rehab Potential Excellent   Clinical Impairments Affecting Rehab Potential None   PT Frequency 1x / week   PT Duration 8 weeks   PT Treatment/Interventions Cryotherapy;Electrical Stimulation;Ultrasound;Moist Heat;Therapeutic activities;Therapeutic exercise;Neuromuscular re-education;Patient/family education;Passive range of motion;Manual techniques;Dry needling;Energy conservation   PT Next Visit Plan abdominal massage; abdominal bracing;  soft tissue work; pelvic floor contraction   PT Home Exercise Plan progress as needed   Consulted and Agree with Plan of Care Patient      Patient will benefit from skilled therapeutic intervention in order to improve the following deficits  and impairments:  Pain, Hypomobility, Decreased strength, Decreased mobility, Decreased range of motion, Increased fascial restricitons, Impaired flexibility, Decreased scar mobility, Decreased activity tolerance, Increased muscle spasms  Visit Diagnosis:  Muscle weakness (generalized)  Other lack of coordination     Problem List Patient Active Problem List   Diagnosis Date Noted  . Constipation due to outlet dysfunction   . Rectal pain   . Osteopenia with high risk of fracture 02/05/2015  . BMI 26.0-26.9,adult 05/21/2014  . Heel spur 05/21/2014  . Migraines 12/20/2012  . Insomnia 12/20/2012  . Depression 12/20/2012  . GERD (gastroesophageal reflux disease) 12/20/2012  . Hyperlipidemia with target LDL less than 100 12/20/2012    Earlie Counts, PT 12/06/15 2:48 PM    [facility] [address], [place], [name], [address] Phone: [phone]   Fax:  [phone]  Name: Selena Barnes MRN: [id number] Date of Birth: 07-04-1950

## 2015-12-20 ENCOUNTER — Ambulatory Visit: Payer: Medicare Other | Attending: Internal Medicine | Admitting: Physical Therapy

## 2015-12-20 ENCOUNTER — Encounter: Payer: Self-pay | Admitting: Physical Therapy

## 2015-12-20 DIAGNOSIS — M6281 Muscle weakness (generalized): Secondary | ICD-10-CM | POA: Insufficient documentation

## 2015-12-20 DIAGNOSIS — R278 Other lack of coordination: Secondary | ICD-10-CM | POA: Diagnosis not present

## 2015-12-20 DIAGNOSIS — R293 Abnormal posture: Secondary | ICD-10-CM | POA: Insufficient documentation

## 2015-12-20 NOTE — Patient Instructions (Signed)
Lay on back listening to relaxation music.  Contract the anus for 5 sec, not the abdomen Rest for 20 sec.  Do this for 5 reps 3 times per day  [facility] [address] [place], [place] [address] Phone # [phone] Fax [phone]

## 2015-12-20 NOTE — Therapy (Signed)
[facility] [address], [place], [name], [address] Phone: [phone]   Fax:  [phone]  Physical Therapy Treatment  Patient Details  Name: Selena Barnes MRN: [id number] Date of Birth: 1950/02/28 Referring Provider: Dr. Wilfrid Lund L.  Encounter Date: 12/20/2015      PT End of Session - 12/20/15 1402    Visit Number 4   Number of Visits 10   Date for PT Re-Evaluation 01/13/16   Authorization Type g-code 10th visit   PT Start Time 1402   PT Stop Time 1440   PT Time Calculation (min) 38 min   Activity Tolerance Patient tolerated treatment well   Behavior During Therapy [facility] for tasks assessed/performed      Past Medical History:  Diagnosis Date  . Arthritis   . Constipation   . Depression   . GERD (gastroesophageal reflux disease)   . Hyperlipidemia   . Insomnia   . Migraines   . Osteopenia     Past Surgical History:  Procedure Laterality Date  . ABDOMINAL HYSTERECTOMY    . ANAL RECTAL MANOMETRY N/A 10/06/2015   Procedure: ANO RECTAL MANOMETRY;  Surgeon: Gatha Mayer, MD;  Location: [facility];  Service: Endoscopy;  Laterality: N/A;  . BREAST BIOPSY Right   . CHOLECYSTECTOMY    . LYMPH NODE BIOPSY Left    left arm    There were no vitals filed for this visit.      Subjective Assessment - 12/20/15 1405    Subjective Last week I got up with stomach pains and had diarrhea and hemorroids came out. It is easier to go to the bathroom.    Patient Stated Goals be able to have a bowel movement with greater ease and less pain   Currently in Pain? Yes   Pain Score 2    Pain Location Back  rectal   Pain Orientation Lower   Pain Descriptors / Indicators Burning   Pain Type Chronic pain   Pain Onset More than a month ago   Pain Frequency Constant   Aggravating Factors  bowel movement   Pain Relieving Factors ice   Multiple Pain Sites No                      Pelvic Floor Special  Questions - 12/20/15 0001    Biofeedback resting tone prior to treatment is 6 uv; diaphgramatic breathing with eyes closed to reduce resting tone of pelvic muscles; bearing down to relax muscles; Happy baby position with pelvic floor relexation; contract muscles progressively to relax the pelvic floor with breathing; isolating anal contraction from the gluteal contraction with breathing ; work on control with relaxation of pelvci floor after a contraction   Biofeedback sensor type Rectal  surface   Biofeedback Activity Other  relaxation           [facility] Adult PT Treatment/Exercise - 12/20/15 0001      Self-Care   Self-Care Other Self-Care Comments   Other Self-Care Comments  how to fill out bowel and bladder form to see if correlation of food and abdominal pain                PT Education - 12/20/15 1442    Education provided Yes   Education Details relaxing the pelvic floor   Person(s) Educated Patient   Methods Explanation;Demonstration;Verbal cues;Handout   Comprehension Returned demonstration;Verbalized understanding          PT Short Term  Goals - 12/20/15 1404      PT SHORT TERM GOAL #1   Title independent with flexibility exercises   Time 4   Period Weeks   Status Achieved     PT SHORT TERM GOAL #2   Title understand correct toileting technique to relax the pelvic floor   Time 4   Period Weeks   Status Achieved     PT SHORT TERM GOAL #3   Title understand how to perform abdominal massage to facilitate a bowel movement   Time 4   Period Weeks   Status Achieved     PT SHORT TERM GOAL #4   Title strain to have a bowel movement </= 25% due to relaxation of the pelvic floor   Time 4   Period Weeks   Status Achieved     PT SHORT TERM GOAL #5   Title pain after a bowel movement decreased >/= 25%   Time 4   Period Weeks   Status Achieved           PT Long Term Goals - 11/18/15 1323      PT LONG TERM GOAL #1   Title independent with HEP    Time 8   Period Weeks   Status New     PT LONG TERM GOAL #2   Title ability to have a bowel movement 1-2 times per week   Time 4   Period Weeks   Status New     PT LONG TERM GOAL #3   Title pain after a bowel movement decreased >/= 50%   Time 8   Period Weeks   Status New     PT LONG TERM GOAL #4   Title straining to have a bowel movement decreased >/= 75% due to increased pelvic floor strength   Time 8   Period Weeks   Status New     PT LONG TERM GOAL #5   Title postural awareness to reduce stain on the pelvic floor and lumbar spine   Time 8   Period Weeks   Status New               Plan - 12/20/15 1443    Clinical Impression Statement Patient needed to learn relaxation exercises to bring the resting tone down after a contraction.  Patient started at 6 uv and ended with 2.5 uv due to understanding the feeling of contracting the pelvic floor compared to relaxing.  Patient has difficulty with relaxing after a pelvic floor contraction. Patient has met all STG's.  She has 25% decrease in pain after a bowel movement.  Patient will benefit from skilled therapy to improve tissue mobility and pelvic floor coordination.    Rehab Potential Excellent   Clinical Impairments Affecting Rehab Potential None   PT Frequency 1x / week   PT Duration 8 weeks   PT Treatment/Interventions Cryotherapy;Electrical Stimulation;Ultrasound;Moist Heat;Therapeutic activities;Therapeutic exercise;Neuromuscular re-education;Patient/family education;Passive range of motion;Manual techniques;Dry needling;Energy conservation   PT Next Visit Plan abdominal massage; abdominal bracing;  soft tissue work; pelvic floor contraction   PT Home Exercise Plan progress as needed   Consulted and Agree with Plan of Care Patient      Patient will benefit from skilled therapeutic intervention in order to improve the following deficits and impairments:  Pain, Hypomobility, Decreased strength, Decreased mobility,  Decreased range of motion, Increased fascial restricitons, Impaired flexibility, Decreased scar mobility, Decreased activity tolerance, Increased muscle spasms  Visit Diagnosis: Muscle weakness (generalized)  Abnormal posture  Other lack of coordination     Problem List Patient Active Problem List   Diagnosis Date Noted  . Constipation due to outlet dysfunction   . Rectal pain   . Osteopenia with high risk of fracture 02/05/2015  . BMI 26.0-26.9,adult 05/21/2014  . Heel spur 05/21/2014  . Migraines 12/20/2012  . Insomnia 12/20/2012  . Depression 12/20/2012  . GERD (gastroesophageal reflux disease) 12/20/2012  . Hyperlipidemia with target LDL less than 100 12/20/2012    Earlie Counts, PT 12/20/15 2:47 PM   [facility] [address], [place], [name], [address] Phone: [phone]   Fax:  [phone]  Name: Selena Barnes MRN: [id number] Date of Birth: 07-16-1950

## 2015-12-27 ENCOUNTER — Ambulatory Visit: Payer: Medicare Other | Admitting: Physical Therapy

## 2015-12-27 ENCOUNTER — Encounter: Payer: Self-pay | Admitting: Physical Therapy

## 2015-12-27 DIAGNOSIS — M6281 Muscle weakness (generalized): Secondary | ICD-10-CM | POA: Diagnosis not present

## 2015-12-27 DIAGNOSIS — R293 Abnormal posture: Secondary | ICD-10-CM | POA: Diagnosis not present

## 2015-12-27 DIAGNOSIS — R278 Other lack of coordination: Secondary | ICD-10-CM

## 2015-12-27 NOTE — Patient Instructions (Addendum)
Introduction to Fiber Fiber Overview Dietary fiber is the part of plants that can't be digested. There are 2 kinds of dietary fiber: insoluble and soluble.  Insoluble fiber adds bulk to keep foods moving through the digestive system. Soluble fiber holds water, which softens the stool for easy bowel movements. Fiber is an important part of your diet, even though it passes through your body undigested and has no nutritional value. A high fiber diet can:  . promote regular bowel movements  . treat diverticular disease (inflammation of part of the intestine) and irritable bowel syndrome (abdominal pain, diarrhea, and constipation that come and go) . promote improvement in hemorrhoids, constipation and fecal incontinence  You should have at least 14 grams of fiber for every 1000 calories you eat every day. Read the label on every food package to find out how much fiber a serving of the food will provide. Foods containing more than 20% of the daily value of fiber per serving are considered high in fiber.  Without enough fiber in your diet, you may suffer from:  . constipation  . small, hard, dry bowel movements.   Sources of Fiber Breads, cereals, and pasta made with whole grain flour, and brown rice are high fiber foods. Many breakfast cereals list the bran or fiber content, so it's easy to know which products are high in fiber.  All fruits and vegetables also contain fiber. Dried beans, leafy vegetables, peas, raisins, prunes, apples, and citrus fruits are all especially good sources of fiber. Ask for examples of high-fiber foods (the fiber table and types of fiber handouts) for more resources on fiber.  Additional information on fiber content in foods, is available at www.caloriecounts.com  Types of Fiber  There are two main types of fiber:  insoluble and soluble.  Both of these types can prevent and relieve constipation and diarrhea, although some people find one or the other to be more easily  digested.  This handout details information about both types of fiber.  Insoluble Fiber       Functions of Insoluble Fiber . moves bulk through the intestines  . controls and balances the pH (acidity) in the intestines       Benefits of Insoluble Fiber . promotes regular bowel movement and prevents constipation  . removes fecal waste through colon in less time  . keeps an optimal pH in intestines to prevent microbes from producing cancer substances, therefore preventing colon cancer        Food Sources of Insoluble Fiber . whole-wheat products  . wheat bran "miller's bran" . corn bran  . flax seed or other seeds . vegetables such as green beans, broccoli, cauliflower and potato skins  . fruit skins and root vegetable skins  . popcorn . brown rice  Soluble Fiber       Functions of Soluble Fiber  . holds water in the colon to bulk and soften the stool . prolongs stomach emptying time so that sugar is released and absorbed more slowly        Benefits of Soluble Fiber . lowers total cholesterol and LDL cholesterol (the bad cholesterol) therefore reducing the risk of heart disease  . regulates blood sugar for people with diabetes       Food Sources of Soluble Fiber . oat/oat bran . dried beans and peas  . nuts  . barley  . flax seed or other seeds . fruits such as oranges, pears, peaches, and apples  . vegetables such as carrots  .  psyllium husk  . Prunes   Adding Fiber to Your Diet Eating foods that are high in fiber can help relieve some problems with constipation, hemorrhoids, diverticulitis and irritable bowel syndrome. A high-fiber diet can provide long-term health benefits.  Here are some tips to help you easily add fibers to your diet.  Start Slowly Many people notice bloating, cramping or gas when they add fiber to their diet. Making small changes in your diet over a period of time can help prevent this. Start with one of the changes listed below, then wait several  days to a week before making another. If one change doesn't seem to work for you, try a different one. You may have some gas or bloating at first, but your body will adjust in time. It's important to drink more fluids when you increase the amount of fiber you eat. If you don't already drink over 6 glasses of liquid a day, drink at least 2 more glasses of water a day when you increase your fiber intake. Tips for Increasing Fiber . Start your day with a high-fiber breakfast cereal. Check labels on the packages for the amounts of dietary fiber in each brand.  Eat at least 5 servings of fruits and vegetables each day. Fruits and vegetables that are high in fiber include: Apples Oranges Broccoli Cauliflower Berries Pears Brussels sprouts Green peas Figs Prunes Carrots Beans . Include fruits or vegetables with every meal. Use carrot sticks or apple slices for snacks.  . Cooked fiber is just as effective as raw fiber, so incorporate high-fiber foods in your cooking. . When preparing food leave edible skins and seeds, and use whole-grain flours. . Serve fruit-based desserts.  . Replace white bread with whole-grain breads and cereals. Eat brown rice instead of white rice.  . Eat more of the following foods: Bran muffins Brown rice Oatmeal Popcorn Multiple-grain cereals, cooked or dry 100% whole-wheat bread  . Add whole grains and dried beans to casseroles.  . Add 1/4 cup of wheat bran (miller's bran) to foods such as cooked cereal or applesauce or meat loaf. If you still suffer from constipation, talk to your health care provider about fiber laxatives. Psyllium is a soluble fiber that is often used for this purpose. It can be taken in tablet form or as a powder that is mixed in a glass of water. Always read and follow the directions on the label carefully.  Talk to your doctor or pharmacist if you have any concerns about fiber.  Quick Contraction: Gravity Resisted (Sitting)    Sitting up straight,  quickly squeeze then fully relax pelvic floor. Perform _1__ sets of __5_. Rest for _1__ seconds between sets. Do _3__ times a day.  Copyright  VHI. All rights reserved.  Diastasis Recti Correction With Towel (Hook-Lying)    Loop long towel or sheet under back and criss-cross over lower abdomen. Inhale. In one fluid movement: Pull in navel, pull towel tight across abdomen, exhale, and Hold for _5__ seconds. Return, rest for _5__ seconds. Repeat __10_ times. Do _1__ times a day.   Copyright  VHI. All rights reserved.  [facility] [address] [place], [place] [address] Phone # [phone] Fax [phone]

## 2015-12-27 NOTE — Therapy (Signed)
[facility] [address] [place], [name], [address] Phone: [phone]   Fax:  [phone]  Physical Therapy Treatment  Patient Details  Name: Selena Barnes MRN: [id number] Date of Birth: 23-Nov-1950 Referring Provider: Dr. Wilfrid Lund L.  Encounter Date: 12/27/2015      PT End of Session - 12/27/15 1443    Visit Number 5   Number of Visits 10   Date for PT Re-Evaluation 01/13/16   Authorization Type g-code 10th visit   PT Start Time 1400   PT Stop Time 1445   PT Time Calculation (min) 45 min   Activity Tolerance Patient tolerated treatment well   Behavior During Therapy [facility] for tasks assessed/performed      Past Medical History:  Diagnosis Date  . Arthritis   . Constipation   . Depression   . GERD (gastroesophageal reflux disease)   . Hyperlipidemia   . Insomnia   . Migraines   . Osteopenia     Past Surgical History:  Procedure Laterality Date  . ABDOMINAL HYSTERECTOMY    . ANAL RECTAL MANOMETRY N/A 10/06/2015   Procedure: ANO RECTAL MANOMETRY;  Surgeon: Gatha Mayer, MD;  Location: [facility];  Service: Endoscopy;  Laterality: N/A;  . BREAST BIOPSY Right   . CHOLECYSTECTOMY    . LYMPH NODE BIOPSY Left    left arm    There were no vitals filed for this visit.      Subjective Assessment - 12/27/15 1401    Subjective I filled out my diary. I am feeling better.  I am going to the bathroom.  My bowel movements are little nuts then comes out soft. I did not have difficulty.    Patient Stated Goals be able to have a bowel movement with greater ease and less pain   Currently in Pain? No/denies                         [facility] Adult PT Treatment/Exercise - 12/27/15 0001      Self-Care   Self-Care Other Self-Care Comments   Other Self-Care Comments  looked over bowel and bladder diary, not eating enough vegetables, needs more water (71 ounces), drinking diet coke and tea but needs  more water, needs more fiber in her diet; How to add Fiber into her diet and what kind of fiber would be beneficial     Lumbar Exercises: Supine   Ab Set 10 reps  use towel to assist in abdominal contraction     Manual Therapy   Manual Therapy Soft tissue mobilization;Joint mobilization   Joint Mobilization L1-L3 A-P mobilization grade3 and rotational mobilization grae 3   Soft tissue mobilization bil. diaphgram, quadratus and thoracic/lumbar paraspinals                PT Education - 12/27/15 1422    Education provided Yes   Education Details How to add fiber to diet; transverse abdominal with sheet to engage muscles   Person(s) Educated Patient   Methods Explanation;Demonstration;Verbal cues;Handout   Comprehension Verbalized understanding;Returned demonstration          PT Short Term Goals - 12/20/15 1404      PT SHORT TERM GOAL #1   Title independent with flexibility exercises   Time 4   Period Weeks   Status Achieved     PT SHORT TERM GOAL #2   Title understand correct toileting technique to relax the pelvic floor  Time 4   Period Weeks   Status Achieved     PT SHORT TERM GOAL #3   Title understand how to perform abdominal massage to facilitate a bowel movement   Time 4   Period Weeks   Status Achieved     PT SHORT TERM GOAL #4   Title strain to have a bowel movement </= 25% due to relaxation of the pelvic floor   Time 4   Period Weeks   Status Achieved     PT SHORT TERM GOAL #5   Title pain after a bowel movement decreased >/= 25%   Time 4   Period Weeks   Status Achieved           PT Long Term Goals - 12/27/15 1446      PT LONG TERM GOAL #1   Title independent with HEP   Time 8   Period Weeks   Status New     PT LONG TERM GOAL #2   Title ability to have a bowel movement 1-2 times per week   Time 4   Period Weeks   Status On-going  learned how to add FIber in diet     PT LONG TERM GOAL #3   Title pain after a bowel movement  decreased >/= 50%   Time 8   Period Weeks   Status On-going  decreased     PT LONG TERM GOAL #4   Title straining to have a bowel movement decreased >/= 75% due to increased pelvic floor strength   Time 8   Period Weeks   Status On-going     PT LONG TERM GOAL #5   Title postural awareness to reduce stain on the pelvic floor and lumbar spine   Time 8   Period Weeks   Status Achieved               Plan - 12/27/15 1444    Clinical Impression Statement Patient has tightness located in bil. quadratus and L3 was rotated left.  Patient able to have a bowel movement with less straining.  Patient is able to perform quick flick contraction correctly.  Patient is understanding the importance of adding fiber and more water to her diet.  Patient will benefit from skilled therapy to improve tissue mobility and pelvic floor coordination.    Rehab Potential Excellent   Clinical Impairments Affecting Rehab Potential None   PT Frequency 1x / week   PT Duration 8 weeks   PT Treatment/Interventions Cryotherapy;Electrical Stimulation;Ultrasound;Moist Heat;Therapeutic activities;Therapeutic exercise;Neuromuscular re-education;Patient/family education;Passive range of motion;Manual techniques;Dry needling;Energy conservation   PT Next Visit Plan  abdominal bracing;  soft tissue work;    PT Home Exercise Plan progress as needed   Consulted and Agree with Plan of Care Patient      Patient will benefit from skilled therapeutic intervention in order to improve the following deficits and impairments:  Pain, Hypomobility, Decreased strength, Decreased mobility, Decreased range of motion, Increased fascial restricitons, Impaired flexibility, Decreased scar mobility, Decreased activity tolerance, Increased muscle spasms  Visit Diagnosis: Muscle weakness (generalized)  Abnormal posture  Other lack of coordination     Problem List Patient Active Problem List   Diagnosis Date Noted  .  Constipation due to outlet dysfunction   . Rectal pain   . Osteopenia with high risk of fracture 02/05/2015  . BMI 26.0-26.9,adult 05/21/2014  . Heel spur 05/21/2014  . Migraines 12/20/2012  . Insomnia 12/20/2012  . Depression 12/20/2012  . GERD (  gastroesophageal reflux disease) 12/20/2012  . Hyperlipidemia with target LDL less than 100 12/20/2012    Earlie Counts, PT 12/27/15 2:48 PM   [facility] [address] [place], [name], [address] Phone: [phone]   Fax:  [phone]  Name: Selena Barnes MRN: [id number] Date of Birth: [birth year]

## 2015-12-28 ENCOUNTER — Encounter: Payer: Self-pay | Admitting: Family Medicine

## 2015-12-28 ENCOUNTER — Telehealth: Payer: Self-pay | Admitting: Family Medicine

## 2015-12-28 ENCOUNTER — Ambulatory Visit (INDEPENDENT_AMBULATORY_CARE_PROVIDER_SITE_OTHER): Payer: Medicare Other | Admitting: Family Medicine

## 2015-12-28 ENCOUNTER — Other Ambulatory Visit: Payer: Self-pay | Admitting: *Deleted

## 2015-12-28 VITALS — BP 112/71 | HR 71 | Temp 98.9°F | Ht 63.0 in | Wt 143.2 lb

## 2015-12-28 DIAGNOSIS — M7731 Calcaneal spur, right foot: Secondary | ICD-10-CM

## 2015-12-28 DIAGNOSIS — N952 Postmenopausal atrophic vaginitis: Secondary | ICD-10-CM

## 2015-12-28 DIAGNOSIS — Z01419 Encounter for gynecological examination (general) (routine) without abnormal findings: Secondary | ICD-10-CM | POA: Diagnosis not present

## 2015-12-28 DIAGNOSIS — M8580 Other specified disorders of bone density and structure, unspecified site: Secondary | ICD-10-CM

## 2015-12-28 MED ORDER — DICLOFENAC POTASSIUM 50 MG PO TABS: 50.0000 mg | ORAL_TABLET | Freq: Three times a day (TID) | ORAL | 3 refills | Status: DC

## 2015-12-28 MED ORDER — RALOXIFENE HCL 60 MG PO TABS: 60.0000 mg | ORAL_TABLET | Freq: Every day | ORAL | 3 refills | Status: DC

## 2015-12-28 MED ORDER — PROMETHAZINE HCL 25 MG PO TABS: 25.0000 mg | ORAL_TABLET | Freq: Four times a day (QID) | ORAL | 3 refills | Status: DC | PRN

## 2015-12-28 MED ORDER — ESTRADIOL 0.1 MG/GM VA CREA
1.0000 | TOPICAL_CREAM | Freq: Every day | VAGINAL | 3 refills | Status: DC
Start: 2015-12-28 — End: 2015-12-28

## 2015-12-28 MED ORDER — METAXALONE 800 MG PO TABS: 800.0000 mg | ORAL_TABLET | Freq: Three times a day (TID) | ORAL | 3 refills | Status: DC

## 2015-12-28 MED ORDER — ESTRADIOL 0.1 MG/GM VA CREA
1.0000 | TOPICAL_CREAM | Freq: Every day | VAGINAL | 12 refills | Status: DC
Start: 2015-12-28 — End: 2015-12-28

## 2015-12-28 MED ORDER — ESTRADIOL 0.1 MG/GM VA CREA: 1.0000 | TOPICAL_CREAM | Freq: Every day | VAGINAL | 12 refills | Status: DC

## 2015-12-28 MED ORDER — ESTRADIOL 0.1 MG/GM VA CREA: 1.0000 | TOPICAL_CREAM | Freq: Every day | VAGINAL | 3 refills | Status: DC

## 2015-12-28 NOTE — Progress Notes (Signed)
BP 112/71   Pulse 71   Temp 98.9 F (37.2 C) (Oral)   Ht 5\' 3"  (1.6 m)   Wt 143 lb 4 oz (65 kg)   BMI 25.38 kg/m    Subjective:    Patient ID: Selena Barnes, female    DOB: Aug 19, 1950, 65 y.o.   MRN: [id number]  HPI: Selena Barnes is a 65 y.o. female presenting on 12/28/2015 for Gynecologic Exam and Medication Refill   HPI Well woman exam and Pap Patient comes in today for a well woman exam and Pap smear. She has a history of total hysterectomy. She does say that she has been having some vaginal irritation and pain with intercourse and dryness as well. She denies any palpable lumps or nodules in her breast. She did just have a mammogram about 5 months ago. She is on medications for osteoporosis which is being managed by Selena Barnes. She needs some refills on some medications as well today.  Relevant past medical, surgical, family and social history reviewed and updated as indicated. Interim medical history since our last visit reviewed. Allergies and medications reviewed and updated.  Review of Systems  Constitutional: Negative for chills and fever.  HENT: Negative for congestion, ear discharge and ear pain.   Eyes: Negative for redness and visual disturbance.  Respiratory: Negative for chest tightness and shortness of breath.   Cardiovascular: Negative for chest pain, palpitations and leg swelling.  Gastrointestinal: Negative for abdominal pain.  Endocrine: Negative for cold intolerance and heat intolerance.  Genitourinary: Positive for vaginal pain. Negative for difficulty urinating, dysuria, flank pain, menstrual problem, vaginal bleeding and vaginal discharge.  Musculoskeletal: Negative for back pain and gait problem.  Skin: Negative for rash.  Neurological: Negative for dizziness, light-headedness and headaches.  Psychiatric/Behavioral: Negative for agitation and behavioral problems.  All other systems reviewed and are negative.   Per HPI unless specifically  indicated above     Objective:    BP 112/71   Pulse 71   Temp 98.9 F (37.2 C) (Oral)   Ht 5\' 3"  (1.6 m)   Wt 143 lb 4 oz (65 kg)   BMI 25.38 kg/m   Wt Readings from Last 3 Encounters:  12/28/15 143 lb 4 oz (65 kg)  11/10/15 148 lb (67.1 kg)  09/28/15 147 lb 2 oz (66.7 kg)    Physical Exam  Constitutional: She is oriented to person, place, and time. She appears well-developed and well-nourished. No distress.  Eyes: Conjunctivae are normal.  Neck: Neck supple. No thyromegaly present.  Cardiovascular: Normal rate, regular rhythm, normal heart sounds and intact distal pulses.   No murmur heard. Pulmonary/Chest: Effort normal and breath sounds normal. No respiratory distress. She has no wheezes. Right breast exhibits no inverted nipple, no mass, no nipple discharge, no skin change and no tenderness. Left breast exhibits no inverted nipple, no mass, no nipple discharge, no skin change and no tenderness. Breasts are symmetrical.  Abdominal: Soft. Bowel sounds are normal. She exhibits no distension. There is no tenderness. There is no rebound and no guarding.  Genitourinary: No breast swelling, tenderness, discharge or bleeding. Pelvic exam was performed with patient supine. There is no rash or lesion on the right labia. There is no rash or lesion on the left labia. There is tenderness (Irritation and dryness and atrophy) in the vagina.  Genitourinary Comments: Status post hysterectomy, vaginal cuff is dry but otherwise intact and normal. No vaginal discharge.  Musculoskeletal: Normal range of motion. She  exhibits no edema.  Lymphadenopathy:    She has no cervical adenopathy.    She has no axillary adenopathy.  Neurological: She is alert and oriented to person, place, and time. Coordination normal.  Skin: Skin is warm and dry. No rash noted. She is not diaphoretic.  Psychiatric: She has a normal mood and affect. Her behavior is normal.  Vitals reviewed.     Assessment & Plan:    Problem List Items Addressed This Visit    None    Visit Diagnoses    Well woman exam with routine gynecological exam    -  Primary   Relevant Orders   Pap IG, rfx HPV all pth   Vaginal atrophy       Relevant Medications   estradiol (ESTRACE VAGINAL) 0.1 MG/GM vaginal cream   Osteopenia, unspecified location       Patient needs a refill, will follow-up with Vernona Rieger Barnes   Relevant Medications   raloxifene (EVISTA) 60 MG tablet   Heel spur, right       Refill only, doing well   Relevant Medications   diclofenac (CATAFLAM) 50 MG tablet       Follow up plan: Return in about 1 year (around 12/27/2016), or if symptoms worsen or fail to improve, for Physical and labs.  Counseling provided for all of the vaccine components No orders of the defined types were placed in this encounter.   Caryl Pina, MD [facility] 12/28/2015, 10:38 AM

## 2015-12-28 NOTE — Telephone Encounter (Signed)
Medication sent to [facility]

## 2015-12-28 NOTE — Progress Notes (Signed)
RX sent into requested pharmacy per pt request Okayed per Dr Dettinger

## 2015-12-31 LAB — PAP IG, RFX HPV ALL PTH: PAP Smear Comment: 0

## 2016-01-03 ENCOUNTER — Ambulatory Visit: Payer: Medicare Other | Admitting: Physical Therapy

## 2016-01-03 ENCOUNTER — Encounter: Payer: Self-pay | Admitting: Physical Therapy

## 2016-01-03 DIAGNOSIS — R293 Abnormal posture: Secondary | ICD-10-CM | POA: Diagnosis not present

## 2016-01-03 DIAGNOSIS — R278 Other lack of coordination: Secondary | ICD-10-CM

## 2016-01-03 DIAGNOSIS — M6281 Muscle weakness (generalized): Secondary | ICD-10-CM | POA: Diagnosis not present

## 2016-01-03 NOTE — Therapy (Signed)
[facility] [address], [place], [name], [address] Phone: [phone]   Fax:  [phone]  Physical Therapy Treatment  Patient Details  Name: Selena Barnes MRN: [id number] Date of Birth: [birth year] Referring Provider: Dr. Wilfrid Lund  Encounter Date: 01/03/2016      PT End of Session - 01/03/16 1417    Visit Number 6   Number of Visits 10   Date for PT Re-Evaluation 02/10/16   Authorization Type g-code 10th visit   PT Start Time [id number]   PT Stop Time 1441   PT Time Calculation (min) 38 min   Activity Tolerance Patient tolerated treatment well   Behavior During Therapy [facility] for tasks assessed/performed      Past Medical History:  Diagnosis Date  . Arthritis   . Constipation   . Depression   . GERD (gastroesophageal reflux disease)   . Hyperlipidemia   . Insomnia   . Migraines   . Osteopenia     Past Surgical History:  Procedure Laterality Date  . ABDOMINAL HYSTERECTOMY    . ANAL RECTAL MANOMETRY N/A 10/06/2015   Procedure: ANO RECTAL MANOMETRY;  Surgeon: Gatha Mayer, MD;  Location: [facility];  Service: Endoscopy;  Laterality: N/A;  . BREAST BIOPSY Right   . CHOLECYSTECTOMY    . LYMPH NODE BIOPSY Left    left arm    There were no vitals filed for this visit.      Subjective Assessment - 01/03/16 1406    Subjective The bowels are still coming out in nut sizes. It is not hard to have a bowel movement. No pain with bowel movement. I still cannot feel my abdominal muscles.    Patient Stated Goals be able to have a bowel movement with greater ease and less pain   Currently in Pain? No/denies            [facility] PT Assessment - 01/03/16 0001      Assessment   Medical Diagnosis N81.84 pelvic floor dysfunction   Referring Provider Dr. Wilfrid Lund   Onset Date/Surgical Date 02/13/74   Prior Therapy None     Precautions   Precautions None     Restrictions   Weight Bearing Restrictions No      Balance Screen   Has the patient fallen in the past 6 months No   Has the patient had a decrease in activity level because of a fear of falling?  No   Is the patient reluctant to leave their home because of a fear of falling?  No     Home Ecologist residence     Prior Function   Level of Independence Independent   Vocation Retired     Associate Professor   Overall Cognitive Status Within Functional Limits for tasks assessed     Observation/Other Assessments   Focus on Therapeutic Outcomes (FOTO)  40% limitation  goal is 30% limitation     Posture/Postural Control   Posture/Postural Control Postural limitations   Postural Limitations Forward head;Rounded Shoulders;Increased lumbar lordosis;Increased thoracic kyphosis     AROM   Overall AROM Comments lumbar extension decreased by 25% and bil. sidebending is full     Strength   Overall Strength Comments abdominal strength is 2/5                  Pelvic Floor Special Questions - 01/03/16 0001    Palpation left coccygeus; left ischiococcygeus; left puborectalis  Strength fair squeeze, definite lift  has trouble coordinating the contraction; will [place]   Biofeedback resting tone prior to treatment is 6 uv; diaphgramatic breathing with eyes closed to reduce resting tone of pelvic muscles; bearing down to relax muscles; Happy baby position with pelvic floor relexation; contract muscles progressively to relax the pelvic floor with breathing; isolating anal contraction from the gluteal contraction with breathing ; work on control with relaxation of pelvci floor after a contraction   Biofeedback sensor type Rectal  surface   Biofeedback Activity Other  relaxation           [facility] Adult PT Treatment/Exercise - 01/03/16 0001      Lumbar Exercises: Seated   Other Seated Lumbar Exercises sit on green physioball_ lean back to engage lower abdomina 10x, pelvic sway 20x, pelvic circles each way,  marching with abdominal bracing; alternate shoulder flexion; trunk flexion in sitting 3 times   Other Seated Lumbar Exercises supine with feet on ball and roll to her 20x, move legs side to side 20x     Manual Therapy   Manual Therapy Soft tissue mobilization   Soft tissue mobilization soft tissue work to abdominal, transverse abdominus, bil. diaphgram, obliques                PT Education - 01/03/16 1443    Education Details physioball exercises   Person(s) Educated Patient   Methods Explanation;Demonstration;Handout   Comprehension Returned demonstration;Verbalized understanding          PT Short Term Goals - 12/20/15 1404      PT SHORT TERM GOAL #1   Title independent with flexibility exercises   Time 4   Period Weeks   Status Achieved     PT SHORT TERM GOAL #2   Title understand correct toileting technique to relax the pelvic floor   Time 4   Period Weeks   Status Achieved     PT SHORT TERM GOAL #3   Title understand how to perform abdominal massage to facilitate a bowel movement   Time 4   Period Weeks   Status Achieved     PT SHORT TERM GOAL #4   Title strain to have a bowel movement </= 25% due to relaxation of the pelvic floor   Time 4   Period Weeks   Status Achieved     PT SHORT TERM GOAL #5   Title pain after a bowel movement decreased >/= 25%   Time 4   Period Weeks   Status Achieved           PT Long Term Goals - 01/03/16 1409      PT LONG TERM GOAL #1   Title independent with HEP   Time 8   Period Weeks   Status On-going     PT LONG TERM GOAL #2   Title ability to have a bowel movement 1-2 times per week   Time 8   Period Weeks   Status Achieved     PT LONG TERM GOAL #3   Title pain after a bowel movement decreased >/= 50%   Time 8   Period Weeks   Status On-going     PT LONG TERM GOAL #4   Title straining to have a bowel movement decreased >/= 75% due to increased pelvic floor strength   Time 8   Period Weeks    Status On-going  50% easier     PT LONG TERM GOAL #5   Title postural  awareness to reduce stain on the pelvic floor and lumbar spine   Time 8   Period Weeks   Status Achieved               Plan - 01/03/16 1418    Clinical Impression Statement Patient is able to have a bowel movement with 50% less straining.  Patient reports no pain with bowel movement.  Patient has 2 bowel movements per week.  Patient has tightness in abdominal muscles and diaphgram.  Patient has difficulty with engating the lower abdominals due to weakness. Patient has improved lumbar ROM.  Patient will benefit from skilled therapy to improve muscle mobility and contraction.    Rehab Potential Excellent   Clinical Impairments Affecting Rehab Potential None   PT Frequency 1x / week   PT Duration 4 weeks   PT Treatment/Interventions Cryotherapy;Electrical Stimulation;Ultrasound;Moist Heat;Therapeutic activities;Therapeutic exercise;Neuromuscular re-education;Patient/family education;Passive range of motion;Manual techniques;Dry needling;Energy conservation   PT Next Visit Plan  abdominal bracing;  soft tissue work;    PT Home Exercise Plan progress as needed   Consulted and Agree with Plan of Care Patient      Patient will benefit from skilled therapeutic intervention in order to improve the following deficits and impairments:  Pain, Hypomobility, Decreased strength, Decreased mobility, Decreased range of motion, Increased fascial restricitons, Impaired flexibility, Decreased scar mobility, Decreased activity tolerance, Increased muscle spasms  Visit Diagnosis: Muscle weakness (generalized) - Plan: PT plan of care cert/re-cert  Other lack of coordination - Plan: PT plan of care cert/re-cert  Abnormal posture - Plan: PT plan of care cert/re-cert     Problem List Patient Active Problem List   Diagnosis Date Noted  . Constipation due to outlet dysfunction   . Rectal pain   . Osteopenia with high risk of  fracture 02/05/2015  . BMI 26.0-26.9,adult 05/21/2014  . Heel spur 05/21/2014  . Migraines 12/20/2012  . Insomnia 12/20/2012  . Depression 12/20/2012  . GERD (gastroesophageal reflux disease) 12/20/2012  . Hyperlipidemia with target LDL less than 100 12/20/2012    Earlie Counts, PT 01/03/16 2:56 PM   [facility] [address], [place], [name], [address] Phone: [phone]   Fax:  [phone]  Name: Selena Barnes MRN: [id number] Date of Birth: November 14, 1950

## 2016-01-03 NOTE — Patient Instructions (Addendum)
  With one or two legs/feet up on the physioball, gently push your heels/legs down into the ball and begin to roll the ball up towards your buttocks. Now, control the ball back down into the starting position and repeat. 20 times 1 time per day.   Start with legs straight and feet on top of ball. Raise hips up towards ceiling keeping feet on ball. With hips still ups and legs straight roll ball to left then right as far as possible without falling off ball (use arms for support). Return ball to center and puts hips down. 10 times each way 1 time per day.    [facility] [address] [place], [place] [address] Phone # [phone] Fax [phone]

## 2016-01-10 ENCOUNTER — Encounter: Payer: Self-pay | Admitting: Physical Therapy

## 2016-01-10 ENCOUNTER — Ambulatory Visit: Payer: Medicare Other | Admitting: Physical Therapy

## 2016-01-10 DIAGNOSIS — R293 Abnormal posture: Secondary | ICD-10-CM | POA: Diagnosis not present

## 2016-01-10 DIAGNOSIS — R278 Other lack of coordination: Secondary | ICD-10-CM

## 2016-01-10 DIAGNOSIS — M6281 Muscle weakness (generalized): Secondary | ICD-10-CM

## 2016-01-10 NOTE — Patient Instructions (Addendum)
Bracing With Knee Fallout (Hook-Lying)    With neutral spine, tighten pelvic floor and abdominals and hold. Alternating legs, drop knee out to side. Keep opposite hip still. Repeat _10__ times. Do _1__ times a day.   Copyright  VHI. All rights reserved.   Bracing With Leg March (Hook-Lying)    With neutral spine, tighten pelvic floor and abdominals and hold. Alternating legs, lift foot _6__ inches and return to floor. Repeat _10__ times. Do _1__ times a day.   Copyright  VHI. All rights reserved.   You tube toileting pelvic floor meditation by [facility] [address] [place], [place] [address] Phone # [phone] Fax [phone]

## 2016-01-10 NOTE — Therapy (Signed)
[facility] [address] [place], [name], [address] Phone: [phone]   Fax:  [phone]  Physical Therapy Treatment  Patient Details  Name: Selena Barnes MRN: [id number] Date of Birth: 06/24/1950 Referring Provider: Dr. Wilfrid Lund  Encounter Date: 01/10/2016      PT End of Session - 01/10/16 1404    Visit Number 7   Number of Visits 10   Date for PT Re-Evaluation 02/10/16   Authorization Type g-code 10th visit   PT Start Time 1402   PT Stop Time 1440   PT Time Calculation (min) 38 min   Activity Tolerance Patient tolerated treatment well   Behavior During Therapy [facility] for tasks assessed/performed      Past Medical History:  Diagnosis Date  . Arthritis   . Constipation   . Depression   . GERD (gastroesophageal reflux disease)   . Hyperlipidemia   . Insomnia   . Migraines   . Osteopenia     Past Surgical History:  Procedure Laterality Date  . ABDOMINAL HYSTERECTOMY    . ANAL RECTAL MANOMETRY N/A 10/06/2015   Procedure: ANO RECTAL MANOMETRY;  Surgeon: Gatha Mayer, MD;  [facility]: [facility];  Service: Endoscopy;  Laterality: N/A;  . BREAST BIOPSY Right   . CHOLECYSTECTOMY    . LYMPH NODE BIOPSY Left    left arm    There were no vitals filed for this visit.      Subjective Assessment - 01/10/16 1404    Subjective I am sore due to going up and down stairs to get decorations.  1 time per week on Tuesday I have diarrhea or vomiting.    Patient Stated Goals be able to have a bowel movement with greater ease and less pain   Currently in Pain? No/denies            [facility] PT Assessment - 01/10/16 0001      Palpation   SI assessment  ilium is in correct aignment                     [facility] Adult PT Treatment/Exercise - 01/10/16 0001      Lumbar Exercises: Supine   Clam 10 reps  bil. with towel roll under lumbar spine     Manual Therapy   Manual Therapy Soft tissue  mobilization   Joint Mobilization upward and downward mobilization with right SI joint movement in sidely   Soft tissue mobilization bil. coccygeus, puborectalis and ischiocavernsous in sidely with pants on                PT Education - 01/10/16 1440    Education provided Yes   Education Details pelvic floor meditation; abdominal bracing   Person(s) Educated Patient   Methods Explanation;Demonstration;Verbal cues;Handout   Comprehension Verbalized understanding;Returned demonstration          PT Short Term Goals - 12/20/15 1404      PT SHORT TERM GOAL #1   Title independent with flexibility exercises   Time 4   Period Weeks   Status Achieved     PT SHORT TERM GOAL #2   Title understand correct toileting technique to relax the pelvic floor   Time 4   Period Weeks   Status Achieved     PT SHORT TERM GOAL #3   Title understand how to perform abdominal massage to facilitate a bowel movement   Time 4   Period Weeks   Status  Achieved     PT SHORT TERM GOAL #4   Title strain to have a bowel movement </= 25% due to relaxation of the pelvic floor   Time 4   Period Weeks   Status Achieved     PT SHORT TERM GOAL #5   Title pain after a bowel movement decreased >/= 25%   Time 4   Period Weeks   Status Achieved           PT Long Term Goals - 01/10/16 1405      PT LONG TERM GOAL #1   Title independent with HEP   Time 8   Period Weeks   Status On-going     PT LONG TERM GOAL #2   Title ability to have a bowel movement 1-2 times per week   Time 8   Period Weeks   Status Achieved     PT LONG TERM GOAL #3   Title pain after a bowel movement decreased >/= 50%   Time 8   Period Weeks   Status On-going  with the hemmorroids     PT LONG TERM GOAL #4   Title straining to have a bowel movement decreased >/= 75% due to increased pelvic floor strength   Time 8   Period Weeks   Status Achieved     PT LONG TERM GOAL #5   Title postural awareness to reduce  stain on the pelvic floor and lumbar spine   Time 8   Period Weeks   Status Achieved               Plan - 01/10/16 1442    Clinical Impression Statement Patient is able to contract abdominals correctly.  Patient is able to spend 30 min. on the toilet instead of 1 hour.  Patient does not have to strain to have a bowel movement.  Patient reports every  Tuesday she has diaherrea or vomits.  She has 2 bowel movements per week.  Pelvis in correct alignment.  Patient will benefit from skilled therapy to improve muscle mobility and contraction.    Rehab Potential Excellent   Clinical Impairments Affecting Rehab Potential None   PT Frequency 1x / week   PT Duration 4 weeks   PT Treatment/Interventions Cryotherapy;Electrical Stimulation;Ultrasound;Moist Heat;Therapeutic activities;Therapeutic exercise;Neuromuscular re-education;Patient/family education;Passive range of motion;Manual techniques;Dry needling;Energy conservation   PT Next Visit Plan  abdominal bracing;  soft tissue work;    PT Home Exercise Plan progress as needed   Consulted and Agree with Plan of Care Patient      Patient will benefit from skilled therapeutic intervention in order to improve the following deficits and impairments:  Pain, Hypomobility, Decreased strength, Decreased mobility, Decreased range of motion, Increased fascial restricitons, Impaired flexibility, Decreased scar mobility, Decreased activity tolerance, Increased muscle spasms  Visit Diagnosis: Muscle weakness (generalized)  Other lack of coordination     Problem List Patient Active Problem List   Diagnosis Date Noted  . Constipation due to outlet dysfunction   . Rectal pain   . Osteopenia with high risk of fracture 02/05/2015  . BMI 26.0-26.9,adult 05/21/2014  . Heel spur 05/21/2014  . Migraines 12/20/2012  . Insomnia 12/20/2012  . Depression 12/20/2012  . GERD (gastroesophageal reflux disease) 12/20/2012  . Hyperlipidemia with target LDL  less than 100 12/20/2012    Earlie Counts, PT 01/10/16 2:47 PM   [facility] [address] [place], [name], [address] Phone: [phone]   Fax:  [phone]  Name: YOLANDER YANDELL MRN: [id number] Date of Birth: 19-Feb-1950

## 2016-01-17 ENCOUNTER — Ambulatory Visit: Payer: Medicare Other | Attending: Internal Medicine | Admitting: Physical Therapy

## 2016-01-17 ENCOUNTER — Encounter: Payer: Self-pay | Admitting: Physical Therapy

## 2016-01-17 DIAGNOSIS — M6281 Muscle weakness (generalized): Secondary | ICD-10-CM

## 2016-01-17 DIAGNOSIS — R278 Other lack of coordination: Secondary | ICD-10-CM | POA: Insufficient documentation

## 2016-01-17 NOTE — Therapy (Signed)
[facility] [address] [place], [name], [address] Phone: [phone]   Fax:  [phone]  Physical Therapy Treatment  Patient Details  Name: Selena Barnes MRN: [id number] Date of Birth: 03-10-1950 Referring Provider: Dr. Wilfrid Lund  Encounter Date: 01/17/2016      PT End of Session - 01/17/16 1315    Visit Number 8   Number of Visits 10   Date for PT Re-Evaluation 02/10/16   Authorization Type g-code 10th visit   PT Start Time 1230   PT Stop Time 1312   PT Time Calculation (min) 42 min   Activity Tolerance Patient tolerated treatment well   Behavior During Therapy [facility] for tasks assessed/performed      Past Medical History:  Diagnosis Date  . Arthritis   . Constipation   . Depression   . GERD (gastroesophageal reflux disease)   . Hyperlipidemia   . Insomnia   . Migraines   . Osteopenia     Past Surgical History:  Procedure Laterality Date  . ABDOMINAL HYSTERECTOMY    . ANAL RECTAL MANOMETRY N/A 10/06/2015   Procedure: ANO RECTAL MANOMETRY;  Surgeon: Gatha Mayer, MD;  [facility];  Service: Endoscopy;  Laterality: N/A;  . BREAST BIOPSY Right   . CHOLECYSTECTOMY    . LYMPH NODE BIOPSY Left    left arm    There were no vitals filed for this visit.      Subjective Assessment - 01/17/16 1236    Subjective I have a bone spur making my left foot hurt.  It started this morning. I had a hard bowel movement today.  The last bowel movement was 8 days ago. I have not been taking the stool softner. I was staying at sons house with 2 grandsons and 2 dogs. Initially the stool was hard with lumps then soft afterwards.    Patient Stated Goals be able to have a bowel movement with greater ease and less pain   Currently in Pain? Yes   Pain Score 6    Pain Location Rectum   Pain Orientation Mid   Pain Descriptors / Indicators --  straining pain   Pain Type Chronic pain   Pain Radiating Towards  during a bowel movement   Pain Onset More than a month ago   Pain Frequency Intermittent   Aggravating Factors  bowel movement   Pain Relieving Factors ice   Multiple Pain Sites No                         [facility] Adult PT Treatment/Exercise - 01/17/16 0001      Manual Therapy   Manual Therapy Soft tissue mobilization;Myofascial release   Manual therapy comments rolling a ball under left foot to release the pelvic floor and decrease pain from bone spur   Soft tissue mobilization soft tissue work  along the left psoas   Myofascial Release release of the lower abdominal with separating tissue between bladder and rectum; release of bilateral diaphgram with one hand on back and other on anterior abdominals; one hand on stomach area and other on back to release the tissue                PT Education - 01/17/16 1315    Education provided No          PT Short Term Goals - 12/20/15 1404      PT SHORT TERM GOAL #1  Title independent with flexibility exercises   Time 4   Period Weeks   Status Achieved     PT SHORT TERM GOAL #2   Title understand correct toileting technique to relax the pelvic floor   Time 4   Period Weeks   Status Achieved     PT SHORT TERM GOAL #3   Title understand how to perform abdominal massage to facilitate a bowel movement   Time 4   Period Weeks   Status Achieved     PT SHORT TERM GOAL #4   Title strain to have a bowel movement </= 25% due to relaxation of the pelvic floor   Time 4   Period Weeks   Status Achieved     PT SHORT TERM GOAL #5   Title pain after a bowel movement decreased >/= 25%   Time 4   Period Weeks   Status Achieved           PT Long Term Goals - 01/10/16 1405      PT LONG TERM GOAL #1   Title independent with HEP   Time 8   Period Weeks   Status On-going     PT LONG TERM GOAL #2   Title ability to have a bowel movement 1-2 times per week   Time 8   Period Weeks   Status Achieved     PT  LONG TERM GOAL #3   Title pain after a bowel movement decreased >/= 50%   Time 8   Period Weeks   Status On-going  with the hemmorroids     PT LONG TERM GOAL #4   Title straining to have a bowel movement decreased >/= 75% due to increased pelvic floor strength   Time 8   Period Weeks   Status Achieved     PT LONG TERM GOAL #5   Title postural awareness to reduce stain on the pelvic floor and lumbar spine   Time 8   Period Weeks   Status Achieved               Plan - 01/17/16 1315    Clinical Impression Statement Patient had a bowel movement after 8 days and had pain at level 6/10.  Patient has not been taking her stool softner daily as perscribed from MD. Patient abdominals was restricted in movement and the diaphgram.  After therapu could hear bowel sounds. Patient will benefit from skilled therapy to improve mobility of tissue to relax muscles to have a bowel movement.    Rehab Potential Excellent   Clinical Impairments Affecting Rehab Potential None   PT Frequency 1x / week   PT Duration 4 weeks   PT Treatment/Interventions Cryotherapy;Electrical Stimulation;Ultrasound;Moist Heat;Therapeutic activities;Therapeutic exercise;Neuromuscular re-education;Patient/family education;Passive range of motion;Manual techniques;Dry needling;Energy conservation   PT Next Visit Plan  abdominal bracing;  soft tissue work;    PT Home Exercise Plan progress as needed   Consulted and Agree with Plan of Care Patient      Patient will benefit from skilled therapeutic intervention in order to improve the following deficits and impairments:  Pain, Hypomobility, Decreased strength, Decreased mobility, Decreased range of motion, Increased fascial restricitons, Impaired flexibility, Decreased scar mobility, Decreased activity tolerance, Increased muscle spasms  Visit Diagnosis: Muscle weakness (generalized)  Other lack of coordination     Problem List Patient Active Problem List    Diagnosis Date Noted  . Constipation due to outlet dysfunction   . Rectal pain   . Osteopenia  with high risk of fracture 02/05/2015  . BMI 26.0-26.9,adult 05/21/2014  . Heel spur 05/21/2014  . Migraines 12/20/2012  . Insomnia 12/20/2012  . Depression 12/20/2012  . GERD (gastroesophageal reflux disease) 12/20/2012  . Hyperlipidemia with target LDL less than 100 12/20/2012    Earlie Counts, PT 01/17/16 1:23 PM   [facility] [address], [place], [name], [address] Phone: [phone]   Fax:  [phone]  Name: Selena Barnes MRN: [id number] Date of Birth: 1950/02/25

## 2016-01-24 ENCOUNTER — Ambulatory Visit: Payer: Medicare Other | Admitting: Physical Therapy

## 2016-01-24 ENCOUNTER — Encounter: Payer: Self-pay | Admitting: Physical Therapy

## 2016-01-24 DIAGNOSIS — M6281 Muscle weakness (generalized): Secondary | ICD-10-CM | POA: Diagnosis not present

## 2016-01-24 DIAGNOSIS — R278 Other lack of coordination: Secondary | ICD-10-CM

## 2016-01-24 NOTE — Patient Instructions (Addendum)
Quick Contraction: Gravity Resisted (Sitting)    Sitting, quickly squeeze then fully relax pelvic floor. Perform _1__ sets of _3 progressive contractions, each one stronger than other Rest for _5__ seconds. Do 10 times.  between sets. Do _2__ times a day.  Copyright  VHI. All rights reserved.  Slow Contraction: Gravity Resisted (Sitting)    Sitting, slowly squeeze pelvic floor for _10__ seconds. Rest for _10__ seconds. Repeat _5__ times. Do _2__ times a day.  Copyright  VHI. All rights reserved.   When having a bowel movement bend at and lean on thighs to relax pelvic floor. Make Grrr noise while push bowels out.   [facility] [address], [place], [place] [address] Phone # [phone] Fax [phone]

## 2016-01-24 NOTE — Therapy (Signed)
[facility] [address] [place], [name], [address] Phone: [phone]   Fax:  [phone]  Physical Therapy Treatment  Patient Details  Name: Selena Barnes MRN: [id number] Date of Birth: 1950-07-24 Referring Provider: Dr. Wilfrid Lund  Encounter Date: 01/24/2016      PT End of Session - 01/24/16 1439    Visit Number 9   Number of Visits 10   Date for PT Re-Evaluation 02/10/16   Authorization Type g-code 10th visit   PT Start Time 1400   PT Stop Time 1439   PT Time Calculation (min) 39 min   Activity Tolerance Patient tolerated treatment well   Behavior During Therapy [facility] for tasks assessed/performed      Past Medical History:  Diagnosis Date  . Arthritis   . Constipation   . Depression   . GERD (gastroesophageal reflux disease)   . Hyperlipidemia   . Insomnia   . Migraines   . Osteopenia     Past Surgical History:  Procedure Laterality Date  . ABDOMINAL HYSTERECTOMY    . ANAL RECTAL MANOMETRY N/A 10/06/2015   Procedure: ANO RECTAL MANOMETRY;  Surgeon: Gatha Mayer, MD;  Location: [facility];  Service: Endoscopy;  Laterality: N/A;  . BREAST BIOPSY Right   . CHOLECYSTECTOMY    . LYMPH NODE BIOPSY Left    left arm    There were no vitals filed for this visit.      Subjective Assessment - 01/24/16 1403    Subjective I am going to the bathroom one time per week.  Bowels are hard then end part is soft. I am doing  my exercises and trying to eat better. I hurt bad in my back when I have to have a bowel movement.    Patient Stated Goals be able to have a bowel movement with greater ease and less pain   Currently in Pain? Yes   Pain Score 3    Pain Location Back   Pain Orientation Lower   Pain Descriptors / Indicators Aching   Pain Onset More than a month ago   Pain Frequency Intermittent   Aggravating Factors  straining to have a bowel movement   Pain Relieving Factors ice   Multiple Pain  Sites No                      Pelvic Floor Special Questions - 01/24/16 0001    Biofeedback relaxing growing factal keeping below 2 uv; sit in posiotion like going to the bathroom with feet up on stool to relax pelvic floor as she is having a bowel movement; 3 progressive quick flick in sitting to relax the pelvic floor after each contraction but has trouble; contract 10 sec then relax in sitting   Biofeedback sensor type Rectal  surface                   PT Education - 01/24/16 1438    Education provided Yes   Education Details pelvic floor contraction holding for 10 sec and quick flicks in sitting.    Person(s) Educated Patient   Methods Explanation;Demonstration;Handout   Comprehension Verbalized understanding;Returned demonstration          PT Short Term Goals - 12/20/15 1404      PT SHORT TERM GOAL #1   Title independent with flexibility exercises   Time 4   Period Weeks   Status Achieved     PT SHORT TERM  GOAL #2   Title understand correct toileting technique to relax the pelvic floor   Time 4   Period Weeks   Status Achieved     PT SHORT TERM GOAL #3   Title understand how to perform abdominal massage to facilitate a bowel movement   Time 4   Period Weeks   Status Achieved     PT SHORT TERM GOAL #4   Title strain to have a bowel movement </= 25% due to relaxation of the pelvic floor   Time 4   Period Weeks   Status Achieved     PT SHORT TERM GOAL #5   Title pain after a bowel movement decreased >/= 25%   Time 4   Period Weeks   Status Achieved           PT Long Term Goals - 01/24/16 1442      PT LONG TERM GOAL #1   Title independent with HEP   Time 8   Period Weeks   Status On-going     PT LONG TERM GOAL #2   Title ability to have a bowel movement 1-2 times per week   Time 8   Period Weeks   Status Achieved     PT LONG TERM GOAL #3   Title pain after a bowel movement decreased >/= 50%   Time 8   Period Weeks    Status On-going     PT LONG TERM GOAL #4   Title straining to have a bowel movement decreased >/= 75% due to increased pelvic floor strength   Time 8   Period Weeks   Status Achieved     PT LONG TERM GOAL #5   Title postural awareness to reduce stain on the pelvic floor and lumbar spine   Time 8   Period Weeks   Status Achieved               Plan - 01/24/16 1439    Clinical Impression Statement Patient is now able to relax the pelvic floor to 1 uv in sitting.  Patient has difficulty with relaxing the pelvic floor after she contracts. Patient is able to contract pelvic floor for 10 min around 5 uv with decreased control.  Patient has increased lumbar pain with bowel movement due to not relaxing pelvic floor and because she is straining to have a bowel movement. Patient has hemmorroids. Patient will benefit fromkilled therapy to improve mobility of tissue to relax muscles to have a bowel movement.    Rehab Potential Excellent   Clinical Impairments Affecting Rehab Potential None   PT Frequency 1x / week   PT Treatment/Interventions Cryotherapy;Electrical Stimulation;Ultrasound;Moist Heat;Therapeutic activities;Therapeutic exercise;Neuromuscular re-education;Patient/family education;Passive range of motion;Manual techniques;Dry needling;Energy conservation   PT Next Visit Plan  abdominal bracing;  soft tissue work; g-code; ERO   PT Home Exercise Plan progress as needed   Consulted and Agree with Plan of Care Patient      Patient will benefit from skilled therapeutic intervention in order to improve the following deficits and impairments:  Pain, Hypomobility, Decreased strength, Decreased mobility, Decreased range of motion, Increased fascial restricitons, Impaired flexibility, Decreased scar mobility, Decreased activity tolerance, Increased muscle spasms  Visit Diagnosis: Muscle weakness (generalized)  Other lack of coordination     Problem List Patient Active Problem  List   Diagnosis Date Noted  . Constipation due to outlet dysfunction   . Rectal pain   . Osteopenia with high risk of fracture 02/05/2015  .  BMI 26.0-26.9,adult 05/21/2014  . Heel spur 05/21/2014  . Migraines 12/20/2012  . Insomnia 12/20/2012  . Depression 12/20/2012  . GERD (gastroesophageal reflux disease) 12/20/2012  . Hyperlipidemia with target LDL less than 100 12/20/2012    Earlie Counts, PT 01/24/16 2:44 PM   [facility] [address] [place], [name], [address] Phone: [phone]   Fax:  [phone]  Name: MAYCIE CARLEY MRN: [id number] Date of Birth: 1950-05-04

## 2016-01-31 ENCOUNTER — Ambulatory Visit: Payer: Medicare Other | Admitting: Physical Therapy

## 2016-01-31 ENCOUNTER — Encounter: Payer: Self-pay | Admitting: Physical Therapy

## 2016-01-31 DIAGNOSIS — M6281 Muscle weakness (generalized): Secondary | ICD-10-CM | POA: Diagnosis not present

## 2016-01-31 DIAGNOSIS — R278 Other lack of coordination: Secondary | ICD-10-CM | POA: Diagnosis not present

## 2016-01-31 NOTE — Patient Instructions (Addendum)
Laxatives  Classification and Dosage of Common Laxatives  Type Function/ comments Generic Name Brand  Dosage  Bulk (also known as Fiber Supplements) Increases stool volume Psyllium- natural  Methylcellulose  Calcium polycarbophil Metamucil, Konsyl Citrucel  Fibercon 1 tsp. up to three times a day  2-4 tablets a day  Osmotic / Hyperosmotic   Decreases absorption of fluids and electrolytes from intestine Should not be used with a  sodium restricted diet with kidney problems or high blood pressure Magnesium   Sodium phosphate   Lactulose  Sorbitol  Polyethylene glycol   Glycerin suppository Milk of Magnesia  Fleet  Phosphasoda  Chronulac  Generic   Golytely, Miralax  Generic 1-2 Tbs. a day   8-16 oz. a day   1-2 Tbs. a day  1-2 Tbs. a day  8-32 oz. a day   Up to daily  Emollient Contains stool lubricant or softener to reduce straining Docusate sodium  Docusate calcium  Mineral oil Colace  Surfak  Generic 100 mg twice a day 240 mg a day  1-3 Tbs. a day  Stimulant Works as intestinal irritant - use with caution as it can cause dependence by decreasing bowel tone and sensation  Bisacodyl  Anthraquinones - Senna  Docusate/casanthranol   Cascara Dulcolax  Senekot  Pericolace, ExLax  Generic 5-15 mg a day  2 tablets a day  1-2 tablets a day  1 tsp. at bedtime   Bridge With Knee Brigantine    Lie on back, both legs on top of ball, knees slightly flexed, arms on floor. Tighten buttocks while keeping abdominals tight and raise hips __6_ inches off floor. Do _20__ sets of _1__ repetitions. Advanced: Do not use arms for support.  Copyright  VHI. All rights reserved.  Unweighted Pelvic Circle    Sit, hands on supports to take weight off spine. Gently rotate pelvis clockwise ___ times, then counterclockwise ___ times. Do ___ sets of ___ repetitions.  Copyright  VHI. All rights reserved.  Unweighted Pelvic Tilt    Sit, hands on  supports to take weight off spine. Gently rock hips forward and backward. Do __1 sets of _10__ repetitions.  Copyright  VHI. All rights reserved.    Unweighted Lateral Pelvic Tilt    Sit, hands on supports to take weight off spine. Gently move hips from side to side. Do _1__ sets of _10__ repetitions.  Copyright  VHI. All rights reserved.  Sit on ball, left alterate hips 20 x, alternate shoulder and hip flexion 20x; sitting bringing alternate hadn to knee.  [facility] [address], [place], [place] [address] Phone # [phone] Fax [phone]

## 2016-01-31 NOTE — Therapy (Signed)
[facility] [address], [place], [name], [address] Phone: [phone]   Fax:  [phone]  Physical Therapy Treatment  Patient Details  Name: OMESHA DESHOTEL MRN: [id number] Date of Birth: June 05, 1950 Referring Provider: Dr. Wilfrid Lund  Encounter Date: 01/31/2016      PT End of Session - 01/31/16 1441    Visit Number 10   Number of Visits 20   Date for PT Re-Evaluation 04/07/15   Authorization Type g-code 20th visit   PT Start Time 1400   PT Stop Time 1442   PT Time Calculation (min) 42 min   Activity Tolerance Patient tolerated treatment well   Behavior During Therapy [facility] for tasks assessed/performed      Past Medical History:  Diagnosis Date  . Arthritis   . Constipation   . Depression   . GERD (gastroesophageal reflux disease)   . Hyperlipidemia   . Insomnia   . Migraines   . Osteopenia     Past Surgical History:  Procedure Laterality Date  . ABDOMINAL HYSTERECTOMY    . ANAL RECTAL MANOMETRY N/A 10/06/2015   Procedure: ANO RECTAL MANOMETRY;  Surgeon: Gatha Mayer, MD;  Location: [facility];  Service: Endoscopy;  Laterality: N/A;  . BREAST BIOPSY Right   . CHOLECYSTECTOMY    . LYMPH NODE BIOPSY Left    left arm    There were no vitals filed for this visit.      Subjective Assessment - 01/31/16 1406    Subjective We are doing better. I am having a bowel movement 1 time per week. I should keep using the stool softner daily.  If I have soft bowel movement, my hemmorroids come out. Urinary leakage after going to the bathroom and alking away decreased by 90%.    Patient Stated Goals be able to have a bowel movement with greater ease and less pain   Currently in Pain? Yes   Pain Score 9    Pain Location Back  rectum   Pain Orientation Lower;Mid   Pain Descriptors / Indicators Aching   Pain Type Chronic pain   Pain Radiating Towards after a loose bowel movement   Pain Onset More than a  month ago   Pain Frequency Intermittent   Aggravating Factors  after having a loose bowel movement   Pain Relieving Factors ice   Multiple Pain Sites No            [facility] PT Assessment - 01/31/16 0001      Assessment   Medical Diagnosis N81.84 pelvic floor dysfunction   Referring Provider Dr. Wilfrid Lund   Onset Date/Surgical Date 02/13/74   Prior Therapy none     Precautions   Precautions None     Restrictions   Weight Bearing Restrictions No     Balance Screen   Has the patient fallen in the past 6 months No   Has the patient had a decrease in activity level because of a fear of falling?  No   Is the patient reluctant to leave their home because of a fear of falling?  No     Home Ecologist residence     Prior Function   Level of Independence Independent   Vocation Retired     Associate Professor   Overall Cognitive Status Within Functional Limits for tasks assessed     Observation/Other Assessments   Focus on Therapeutic Outcomes (FOTO)  45% limitation  goal is 30%  limitation     Posture/Postural Control   Posture/Postural Control Postural limitations   Postural Limitations Forward head;Rounded Shoulders;Increased lumbar lordosis;Increased thoracic kyphosis     AROM   Overall AROM Comments lumbar extension decreased by 25% and bil. sidebending is full     Strength   Overall Strength Comments abdominal strength is 2/5, pushes abdomen out     Palpation   SI assessment  ilium is in correct aignment     Ambulation/Gait   Ambulation/Gait No                  Pelvic Floor Special Questions - 01/31/16 0001    Currently Sexually Active Yes   Is this Painful No   Urinary Leakage Yes   Activities that cause leaking Other   Other activities that cause leaking after going to the bathroom and walk away from the commode   Urinary urgency No           [facility] Adult PT Treatment/Exercise - 01/31/16 0001      Lumbar Exercises:  Seated   Other Seated Lumbar Exercises sit on greenball-bounce, circles both ways, alternate shoulder and hip flexion, LAQ, marching, bilateral hip abduction. sit on ball and stand 10 times      Lumbar Exercises: Supine   Bridge 10 reps  feet on green physioball   Other Supine Lumbar Exercises supine feet on green physioball bringing knees to ches while breathing out to contract abdominals 20x; twist legs side to side contracting the obliques 20x                PT Education - 01/31/16 1441    Education provided Yes   Education Details exercises on the physioball   Person(s) Educated Patient   Methods Explanation;Demonstration;Verbal cues;Handout   Comprehension Returned demonstration;Verbalized understanding          PT Short Term Goals - 12/20/15 1404      PT SHORT TERM GOAL #1   Title independent with flexibility exercises   Time 4   Period Weeks   Status Achieved     PT SHORT TERM GOAL #2   Title understand correct toileting technique to relax the pelvic floor   Time 4   Period Weeks   Status Achieved     PT SHORT TERM GOAL #3   Title understand how to perform abdominal massage to facilitate a bowel movement   Time 4   Period Weeks   Status Achieved     PT SHORT TERM GOAL #4   Title strain to have a bowel movement </= 25% due to relaxation of the pelvic floor   Time 4   Period Weeks   Status Achieved     PT SHORT TERM GOAL #5   Title pain after a bowel movement decreased >/= 25%   Time 4   Period Weeks   Status Achieved           PT Long Term Goals - 01/31/16 1415      PT LONG TERM GOAL #1   Title independent with HEP   Time 8   Period Weeks   Status On-going     PT LONG TERM GOAL #2   Title ability to have a bowel movement 1-2 times per week   Time 8   Period Weeks   Status Achieved     PT LONG TERM GOAL #3   Title pain after a bowel movement decreased >/= 50%   Time 8  Period Weeks   Status On-going  20% better unless have a  soft stool     PT LONG TERM GOAL #4   Title straining to have a bowel movement decreased >/= 75% due to increased pelvic floor strength   Time 8   Period Weeks   Status Achieved     PT LONG TERM GOAL #5   Title postural awareness to reduce stain on the pelvic floor and lumbar spine   Time 8   Period Weeks   Status Achieved               Plan - 02/21/16 1442    Clinical Impression Statement Patient reports she has back pain with loose bowel movements and her hemmorroids will protrude out of the rectum.  Patient does not have to strain to have a bowel movement.  Patient has weakness in abdominals.  Patient pain after a bowel movement decresaed by 20% and can get to a 9/10. Patient has a bowel movement 1 time per week.  Patient reports her uriary leakage after urinating decresaed by 90%. Patient will benefit from skilled therapy to improve mobility of tissue to relax muscles of thave a bowel movment.    Rehab Potential Excellent   Clinical Impairments Affecting Rehab Potential None   PT Frequency 1x / week   PT Duration 8 weeks   PT Treatment/Interventions Cryotherapy;Electrical Stimulation;Ultrasound;Moist Heat;Therapeutic activities;Therapeutic exercise;Neuromuscular re-education;Patient/family education;Passive range of motion;Manual techniques;Dry needling;Energy conservation   PT Next Visit Plan  abdominal bracing; pelvic floor exercises   PT Home Exercise Plan progress as needed   Consulted and Agree with Plan of Care Patient      Patient will benefit from skilled therapeutic intervention in order to improve the following deficits and impairments:  Pain, Hypomobility, Decreased strength, Decreased mobility, Decreased range of motion, Increased fascial restricitons, Impaired flexibility, Decreased scar mobility, Decreased activity tolerance, Increased muscle spasms  Visit Diagnosis: Muscle weakness (generalized) - Plan: PT plan of care cert/re-cert  Other lack of  coordination - Plan: PT plan of care cert/re-cert       G-Codes - 02/21/16 1412    Functional Assessment Tool Used FOTO score is 35% limitation   Functional Limitation Other PT primary   Other PT Primary Current Status [id number]) At least 20 percent but less than 40 percent impaired, limited or restricted   Other PT Primary Goal Status [id number]) At least 20 percent but less than 40 percent impaired, limited or restricted      Problem List Patient Active Problem List   Diagnosis Date Noted  . Constipation due to outlet dysfunction   . Rectal pain   . Osteopenia with high risk of fracture 02/05/2015  . BMI 26.0-26.9,adult 05/21/2014  . Heel spur 05/21/2014  . Migraines 12/20/2012  . Insomnia 12/20/2012  . Depression 12/20/2012  . GERD (gastroesophageal reflux disease) 12/20/2012  . Hyperlipidemia with target LDL less than 100 12/20/2012    Earlie Counts, PT 2016-02-21 2:48 PM   [facility] [address] [place], [name], [address] Phone: [phone]   Fax:  [phone]  Name: DRAVEN MERANTE MRN: [id number] Date of Birth: 11-Jul-1950

## 2016-02-21 ENCOUNTER — Ambulatory Visit: Payer: Medicare Other | Attending: Internal Medicine | Admitting: Physical Therapy

## 2016-02-21 ENCOUNTER — Encounter: Payer: Self-pay | Admitting: Physical Therapy

## 2016-02-21 DIAGNOSIS — R293 Abnormal posture: Secondary | ICD-10-CM | POA: Diagnosis not present

## 2016-02-21 DIAGNOSIS — M6281 Muscle weakness (generalized): Secondary | ICD-10-CM | POA: Insufficient documentation

## 2016-02-21 DIAGNOSIS — R278 Other lack of coordination: Secondary | ICD-10-CM | POA: Diagnosis not present

## 2016-02-21 NOTE — Therapy (Signed)
[facility] [address] [place], [name], [address] Phone: [phone]   Fax:  [phone]  Physical Therapy Treatment  Patient Details  Name: Selena Barnes MRN: [id number] Date of Birth: 1950/03/15 Referring Provider: Dr. Wilfrid Lund  Encounter Date: 02/21/2016      Selena Barnes End of Session - 02/21/16 1318    Visit Number 11   Number of Visits 20   Date for Selena Barnes Re-Evaluation 04/07/15   Authorization Type g-code 20th visit   Selena Barnes Start Time 1230   Selena Barnes Stop Time 1310   Selena Barnes Time Calculation (min) 40 min   Activity Tolerance Patient tolerated treatment well   Behavior During Therapy [facility] for tasks assessed/performed      Past Medical History:  Diagnosis Date  . Arthritis   . Constipation   . Depression   . GERD (gastroesophageal reflux disease)   . Hyperlipidemia   . Insomnia   . Migraines   . Osteopenia     Past Surgical History:  Procedure Laterality Date  . ABDOMINAL HYSTERECTOMY    . ANAL RECTAL MANOMETRY N/A 10/06/2015   Procedure: ANO RECTAL MANOMETRY;  Surgeon: Gatha Mayer, MD;  Location: [facility];  Service: Endoscopy;  Laterality: N/A;  . BREAST BIOPSY Right   . CHOLECYSTECTOMY    . LYMPH NODE BIOPSY Left    left arm    There were no vitals filed for this visit.      Subjective Assessment - 02/21/16 1235    Subjective When I have a soft bowel movement I will have pain due to my hemmorroids coming out. I have 2 bowel movements per week.    Patient Stated Goals be able to have a bowel movement with greater ease and less pain   Currently in Pain? Yes   Pain Score 4    Pain Location Rectum   Pain Orientation Mid   Pain Descriptors / Indicators Aching   Pain Type Chronic pain   Pain Onset More than a month ago   Pain Frequency Intermittent   Aggravating Factors  after having a loose bowel movement   Pain Relieving Factors ice   Multiple Pain Sites No                          [facility] Adult Selena Barnes Treatment/Exercise - 02/21/16 0001      Exercises   Exercises Other Exercises     Manual Therapy   Manual Therapy Soft tissue mobilization;Myofascial release   Soft tissue mobilization along the transverse abdominus and diaphragm   Myofascial Release release of the upper abdominal area and lower abdominal area                 Selena Barnes Education - 02/21/16 1318    Education provided Yes   Education Details diaphragmatic breathing   Person(s) Educated Patient   Methods Explanation;Demonstration;Handout   Comprehension Verbalized understanding;Returned demonstration          Selena Barnes Short Term Goals - 12/20/15 1404      Selena Barnes SHORT TERM GOAL #1   Title independent with flexibility exercises   Time 4   Period Weeks   Status Achieved     Selena Barnes SHORT TERM GOAL #2   Title understand correct toileting technique to relax the pelvic floor   Time 4   Period Weeks   Status Achieved     Selena Barnes SHORT TERM GOAL #3   Title understand how to  perform abdominal massage to facilitate a bowel movement   Time 4   Period Weeks   Status Achieved     Selena Barnes SHORT TERM GOAL #4   Title strain to have a bowel movement </= 25% due to relaxation of the pelvic floor   Time 4   Period Weeks   Status Achieved     Selena Barnes SHORT TERM GOAL #5   Title pain after a bowel movement decreased >/= 25%   Time 4   Period Weeks   Status Achieved           Selena Barnes Long Term Goals - 02/21/16 1245      Selena Barnes LONG TERM GOAL #1   Title independent with HEP   Time 8   Period Weeks   Status On-going  still learning     Selena Barnes LONG TERM GOAL #2   Title ability to have a bowel movement 1-2 times per week   Time 8   Period Weeks   Status Achieved     Selena Barnes LONG TERM GOAL #3   Title pain after a bowel movement decreased >/= 50%   Time 8   Period Weeks   Status On-going  40% better     Selena Barnes LONG TERM GOAL #4   Title straining to have a bowel movement decreased >/= 75% due to increased pelvic floor strength    Time 8   Period Weeks   Status Achieved     Selena Barnes LONG TERM GOAL #5   Title postural awareness to reduce stain on the pelvic floor and lumbar spine   Time 8   Period Weeks   Status Achieved               Plan - 02/21/16 1320    Clinical Impression Statement Patient is having 2-3 bowel movements per week. When patient has a soft bowel movement she will have rectal pain and hemmorroids will protrude.  Patient reports her overall pain has decreased by 40%.  Patient has increased tightness in upper and lower transverse abdominus. Patient will benefit from skilled therapy to improve tissue mobility and reduce rectal pain.    Rehab Potential Excellent   Clinical Impairments Affecting Rehab Potential None   Selena Barnes Frequency 1x / week   Selena Barnes Duration 8 weeks   Selena Barnes Treatment/Interventions Cryotherapy;Electrical Stimulation;Ultrasound;Moist Heat;Therapeutic activities;Therapeutic exercise;Neuromuscular re-education;Patient/family education;Passive range of motion;Manual techniques;Dry needling;Energy conservation   Selena Barnes Next Visit Plan  abdominal bracing; pelvic floor exercises; soft tissue work   Selena Barnes Home Exercise Plan progress as needed   Consulted and Agree with Plan of Care Patient      Patient will benefit from skilled therapeutic intervention in order to improve the following deficits and impairments:  Pain, Hypomobility, Decreased strength, Decreased mobility, Decreased range of motion, Increased fascial restricitons, Impaired flexibility, Decreased scar mobility, Decreased activity tolerance, Increased muscle spasms  Visit Diagnosis: Muscle weakness (generalized)  Other lack of coordination     Problem List Patient Active Problem List   Diagnosis Date Noted  . Constipation due to outlet dysfunction   . Rectal pain   . Osteopenia with high risk of fracture 02/05/2015  . BMI 26.0-26.9,adult 05/21/2014  . Heel spur 05/21/2014  . Migraines 12/20/2012  . Insomnia 12/20/2012  .  Depression 12/20/2012  . GERD (gastroesophageal reflux disease) 12/20/2012  . Hyperlipidemia with target LDL less than 100 12/20/2012    Selena Barnes, Selena Barnes 02/21/16 1:25 PM   [facility] [address] [place],  [place], [name], [address] Phone: [phone]   Fax:  [phone]  Name: Selena Barnes MRN: [id number] Date of Birth: Oct 15, 1950

## 2016-02-21 NOTE — Patient Instructions (Addendum)
Quick Contraction: Gravity Resisted (Sitting)    Sitting, quickly squeeze then fully relax pelvic floor. Perform _1__ sets of _5__. Rest for __1_ seconds between sets. After a bowel movement  Copyright  VHI. All rights reserved.  Slow Contraction: Gravity Resisted (Sitting)    Sitting, slowly squeeze pelvic floor for __5_ seconds. Rest for _1__ seconds. Repeat __3_ times. Do after a bowel movement.   Copyright  VHI. All rights reserved.  Do not focus on breathing.   [facility] [address] [place], [place] [address] Phone # [phone] Fax [phone]

## 2016-02-28 ENCOUNTER — Encounter: Payer: Medicare Other | Admitting: Physical Therapy

## 2016-03-06 ENCOUNTER — Encounter: Payer: Self-pay | Admitting: Physical Therapy

## 2016-03-06 ENCOUNTER — Ambulatory Visit: Payer: Medicare Other | Admitting: Physical Therapy

## 2016-03-06 DIAGNOSIS — R278 Other lack of coordination: Secondary | ICD-10-CM | POA: Diagnosis not present

## 2016-03-06 DIAGNOSIS — M6281 Muscle weakness (generalized): Secondary | ICD-10-CM

## 2016-03-06 DIAGNOSIS — R293 Abnormal posture: Secondary | ICD-10-CM | POA: Diagnosis not present

## 2016-03-06 NOTE — Therapy (Signed)
[facility] [address], [place], [name], [address] Phone: [phone]   Fax:  [phone]  Physical Therapy Treatment  Patient Details  Name: Selena Barnes MRN: [id number] Date of Birth: Jul 08, 1950 Referring Provider: Dr. Wilfrid Lund  Encounter Date: 03/06/2016      PT End of Session - 03/06/16 1252    Visit Number 12   Date for PT Re-Evaluation 04/07/15   PT Start Time 1230   PT Stop Time 1300   PT Time Calculation (min) 30 min   Activity Tolerance Patient tolerated treatment well   Behavior During Therapy [facility] for tasks assessed/performed      Past Medical History:  Diagnosis Date  . Arthritis   . Constipation   . Depression   . GERD (gastroesophageal reflux disease)   . Hyperlipidemia   . Insomnia   . Migraines   . Osteopenia     Past Surgical History:  Procedure Laterality Date  . ABDOMINAL HYSTERECTOMY    . ANAL RECTAL MANOMETRY N/A 10/06/2015   Procedure: ANO RECTAL MANOMETRY;  Surgeon: Gatha Mayer, MD;  Location: [facility];  Service: Endoscopy;  Laterality: N/A;  . BREAST BIOPSY Right   . CHOLECYSTECTOMY    . LYMPH NODE BIOPSY Left    left arm    There were no vitals filed for this visit.      Subjective Assessment - 03/06/16 1233    Subjective I feel better.  I am going to the bathroom.  I had a good week.  I did not have trouble with hemmorroids.    Patient Stated Goals be able to have a bowel movement with greater ease and less pain   Currently in Pain? No/denies            [facility] PT Assessment - 03/06/16 0001      Assessment   Medical Diagnosis N81.84 pelvic floor dysfunction   Referring Provider Dr. Wilfrid Lund   Onset Date/Surgical Date 02/13/74   Prior Therapy none     Precautions   Precautions None     Restrictions   Weight Bearing Restrictions No     Balance Screen   Has the patient fallen in the past 6 months No   Has the patient had a decrease in  activity level because of a fear of falling?  No   Is the patient reluctant to leave their home because of a fear of falling?  No     Home Ecologist residence     Prior Function   Level of Independence Independent   Vocation Retired     Associate Professor   Overall Cognitive Status Within Functional Limits for tasks assessed     Observation/Other Assessments   Focus on Therapeutic Outcomes (FOTO)  0% limitation     Posture/Postural Control   Posture/Postural Control No significant limitations     AROM   Overall AROM Comments lumbar ROM is full     Strength   Overall Strength Comments abdominal strength is 3/5,      Palpation   SI assessment  ilium is in correct aignment     Ambulation/Gait   Ambulation/Gait No                  Pelvic Floor Special Questions - 03/06/16 0001    Urinary Leakage No   Urinary urgency No   Exam Type Deferred           [facility]  Adult PT Treatment/Exercise - 03/06/16 0001      Lumbar Exercises: Seated   Other Seated Lumbar Exercises sit on greenball-bounce, circles both ways, alternate shoulder and hip flexion, LAQ, marching, bilateral hip abduction. sit on ball and stand 10 times      Lumbar Exercises: Supine   Bridge 10 reps  with feet on ball   Other Supine Lumbar Exercises supine feet on green physioball bringing knees to ches while breathing out to contract abdominals 20x; twist legs side to side contracting the obliques 20x                PT Education - 03/06/16 1248    Education provided Yes   Education Details reviewed HEP and able to return demonstration; added supine with feet on ball exercise   Person(s) Educated Patient   Methods Explanation;Demonstration   Comprehension Returned demonstration;Verbalized understanding          PT Short Term Goals - 12/20/15 1404      PT SHORT TERM GOAL #1   Title independent with flexibility exercises   Time 4   Period Weeks   Status  Achieved     PT SHORT TERM GOAL #2   Title understand correct toileting technique to relax the pelvic floor   Time 4   Period Weeks   Status Achieved     PT SHORT TERM GOAL #3   Title understand how to perform abdominal massage to facilitate a bowel movement   Time 4   Period Weeks   Status Achieved     PT SHORT TERM GOAL #4   Title strain to have a bowel movement </= 25% due to relaxation of the pelvic floor   Time 4   Period Weeks   Status Achieved     PT SHORT TERM GOAL #5   Title pain after a bowel movement decreased >/= 25%   Time 4   Period Weeks   Status Achieved           PT Long Term Goals - 03/06/16 1234      PT LONG TERM GOAL #1   Title independent with HEP   Time 8   Period Weeks   Status Achieved     PT LONG TERM GOAL #2   Title ability to have a bowel movement 1-2 times per week   Time 8   Period Weeks   Status Achieved     PT LONG TERM GOAL #3   Title pain after a bowel movement decreased >/= 50%   Time 8   Period Weeks   Status Achieved  none last week     PT LONG TERM GOAL #4   Title straining to have a bowel movement decreased >/= 75% due to increased pelvic floor strength   Time 8   Period Weeks   Status Achieved     PT LONG TERM GOAL #5   Title postural awareness to reduce stain on the pelvic floor and lumbar spine   Time 8   Period Weeks   Status Achieved               Plan - 03/06/16 1250    Clinical Impression Statement Patient has met her goals.  Patient has increased abdominal strength to 3/5.  Patient is independent with her HEP.  Patient is does not have pain or issues with her hemmorroids. Patient has no urinary leakage.  Patient is ready for discharge.    Rehab Potential Excellent  Clinical Impairments Affecting Rehab Potential None   PT Treatment/Interventions Cryotherapy;Electrical Stimulation;Ultrasound;Moist Heat;Therapeutic activities;Therapeutic exercise;Neuromuscular re-education;Patient/family  education;Passive range of motion;Manual techniques;Dry needling;Energy conservation   PT Next Visit Plan Discharge to HEP   PT Home Exercise Plan Current HEP   Consulted and Agree with Plan of Care Patient      Patient will benefit from skilled therapeutic intervention in order to improve the following deficits and impairments:  Pain, Hypomobility, Decreased strength, Decreased mobility, Decreased range of motion, Increased fascial restricitons, Impaired flexibility, Decreased scar mobility, Decreased activity tolerance, Increased muscle spasms  Visit Diagnosis: Muscle weakness (generalized)  Other lack of coordination  Abnormal posture       G-Codes - 2016/03/28 1238    Functional Assessment Tool Used FOTO score is 0% limitation   Functional Limitation Other PT primary   Other PT Primary Goal Status (W4315) At least 20 percent but less than 40 percent impaired, limited or restricted   Other PT Primary Discharge Status (Q0086) 0 percent impaired, limited or restricted      Problem List Patient Active Problem List   Diagnosis Date Noted  . Constipation due to outlet dysfunction   . Rectal pain   . Osteopenia with high risk of fracture 02/05/2015  . BMI 26.0-26.9,adult 05/21/2014  . Heel spur 05/21/2014  . Migraines 12/20/2012  . Insomnia 12/20/2012  . Depression 12/20/2012  . GERD (gastroesophageal reflux disease) 12/20/2012  . Hyperlipidemia with target LDL less than 100 12/20/2012    Earlie Counts, PT Mar 28, 2016 1:01 PM   [facility] [address] [place], [name], [address] Phone: [phone]   Fax:  [phone]  Name: MALKY RUDZINSKI MRN: [id number] Date of Birth: [birth year]  PHYSICAL THERAPY DISCHARGE SUMMARY  Visits from Start of Care: 12  Current functional level related to goals / functional outcomes: See above.    Remaining deficits: See above   Education / Equipment: HEP Plan: Patient  agrees to discharge.  Patient goals were met. Patient is being discharged due to meeting the stated rehab goals.  Thank you for the referral. Earlie Counts, PT 03-28-16 1:01 PM  ?????

## 2016-03-06 NOTE — Patient Instructions (Signed)
Lay on back with feet on ball.   Roll ball to stomach 10x , go on diagonal 10 times each way.[facility] [address], [place], [place] [address] Phone # [phone] Fax [phone]

## 2016-03-13 ENCOUNTER — Encounter: Payer: Medicare Other | Admitting: Physical Therapy

## 2016-05-19 ENCOUNTER — Other Ambulatory Visit: Payer: Self-pay | Admitting: Family Medicine

## 2016-05-19 DIAGNOSIS — Z1231 Encounter for screening mammogram for malignant neoplasm of breast: Secondary | ICD-10-CM

## 2016-06-22 ENCOUNTER — Ambulatory Visit
Admission: RE | Admit: 2016-06-22 | Discharge: 2016-06-22 | Disposition: A | Discharge status: Home / Self Care | Payer: Medicare Other | Source: Ambulatory Visit | Attending: Family Medicine | Admitting: Family Medicine

## 2016-06-22 DIAGNOSIS — Z1231 Encounter for screening mammogram for malignant neoplasm of breast: Secondary | ICD-10-CM

## 2016-07-13 ENCOUNTER — Encounter: Payer: Self-pay | Admitting: Family Medicine

## 2016-07-13 ENCOUNTER — Ambulatory Visit (INDEPENDENT_AMBULATORY_CARE_PROVIDER_SITE_OTHER): Payer: Medicare Other | Admitting: Family Medicine

## 2016-07-13 VITALS — BP 119/71 | HR 83 | Temp 98.5°F | Ht 63.0 in | Wt 135.0 lb

## 2016-07-13 DIAGNOSIS — J209 Acute bronchitis, unspecified: Secondary | ICD-10-CM | POA: Diagnosis not present

## 2016-07-13 MED ORDER — HYDROCODONE-HOMATROPINE 5-1.5 MG/5ML PO SYRP: 5.0000 mL | ORAL_SOLUTION | Freq: Four times a day (QID) | ORAL | 0 refills | Status: DC | PRN

## 2016-07-13 MED ORDER — RALOXIFENE HCL 60 MG PO TABS: 60.0000 mg | ORAL_TABLET | Freq: Every day | ORAL | 3 refills | Status: DC

## 2016-07-13 MED ORDER — DULOXETINE HCL 60 MG PO CPEP: 60.0000 mg | ORAL_CAPSULE | Freq: Two times a day (BID) | ORAL | 3 refills | Status: DC

## 2016-07-13 MED ORDER — ESTRADIOL 0.1 MG/GM VA CREA: 1.0000 | TOPICAL_CREAM | Freq: Every day | VAGINAL | 3 refills | Status: DC

## 2016-07-13 MED ORDER — TOPIRAMATE 100 MG PO TABS: 100.0000 mg | ORAL_TABLET | Freq: Every day | ORAL | 3 refills | Status: DC

## 2016-07-13 MED ORDER — ZOLPIDEM TARTRATE 10 MG PO TABS: 5.0000 mg | ORAL_TABLET | Freq: Every evening | ORAL | 2 refills | Status: DC | PRN

## 2016-07-13 MED ORDER — RIZATRIPTAN BENZOATE 10 MG PO TBDP
10.0000 mg | ORAL_TABLET | ORAL | 3 refills | Status: DC | PRN
Start: 2016-07-13 — End: 2017-04-17

## 2016-07-13 MED ORDER — AZITHROMYCIN 250 MG PO TABS: ORAL_TABLET | ORAL | 0 refills | Status: DC

## 2016-07-13 MED ORDER — METAXALONE 800 MG PO TABS: 800.0000 mg | ORAL_TABLET | Freq: Three times a day (TID) | ORAL | 3 refills | Status: DC | PRN

## 2016-07-13 MED ORDER — PROMETHAZINE HCL 25 MG PO TABS: 25.0000 mg | ORAL_TABLET | Freq: Four times a day (QID) | ORAL | 3 refills | Status: DC | PRN

## 2016-07-13 MED ORDER — NEXIUM 40 MG PO CPDR: 40.0000 mg | DELAYED_RELEASE_CAPSULE | Freq: Every day | ORAL | 3 refills | Status: DC

## 2016-07-13 NOTE — Progress Notes (Signed)
BP 119/71   Pulse 83   Temp 98.5 F (36.9 C) (Oral)   Ht 5\' 3"  (1.6 m)   Wt 135 lb (61.2 kg)   BMI 23.91 kg/m    Subjective:    Patient ID: Selena Barnes, female    DOB: 1950/10/21, 66 y.o.   MRN: [id number]  HPI: Selena Barnes is a 66 y.o. female presenting on 07/13/2016 for Cough (for 2-3 weeks, some nose bleeds)   HPI Cough and sinus congestion and chest congestion Patient has been having cough and sinus congestion and chest congestion that is been going on for 2-3 weeks. She has had some nosebleeds over the past couple days because of the drainage as well. She denies any shortness of breath or wheezing. She just feels like she cannot get over it and especially is having a lot of issues with the coughing at night. Her cough is nonproductive. She denies any sick contacts that she knows of.  Relevant past medical, surgical, family and social history reviewed and updated as indicated. Interim medical history since our last visit reviewed. Allergies and medications reviewed and updated.  Review of Systems  Constitutional: Negative for chills and fever.  HENT: Positive for congestion, postnasal drip, rhinorrhea, sinus pressure, sneezing and sore throat. Negative for ear discharge and ear pain.   Eyes: Negative for pain, redness and visual disturbance.  Respiratory: Positive for cough. Negative for chest tightness, shortness of breath and wheezing.   Cardiovascular: Negative for chest pain and leg swelling.  Genitourinary: Negative for difficulty urinating and dysuria.  Musculoskeletal: Negative for back pain and gait problem.  Skin: Negative for rash.  Neurological: Negative for light-headedness and headaches.  Psychiatric/Behavioral: Negative for agitation and behavioral problems.  All other systems reviewed and are negative.   Per HPI unless specifically indicated above     Objective:    BP 119/71   Pulse 83   Temp 98.5 F (36.9 C) (Oral)   Ht 5\' 3"  (1.6 m)   Wt 135  lb (61.2 kg)   BMI 23.91 kg/m   Wt Readings from Last 3 Encounters:  07/13/16 135 lb (61.2 kg)  12/28/15 143 lb 4 oz (65 kg)  11/10/15 148 lb (67.1 kg)    Physical Exam  Constitutional: She is oriented to person, place, and time. She appears well-developed and well-nourished. No distress.  HENT:  Right Ear: Tympanic membrane, external ear and ear canal normal.  Left Ear: Tympanic membrane, external ear and ear canal normal.  Nose: Mucosal edema and rhinorrhea present. No epistaxis. Right sinus exhibits no maxillary sinus tenderness and no frontal sinus tenderness. Left sinus exhibits no maxillary sinus tenderness and no frontal sinus tenderness.  Mouth/Throat: Uvula is midline and mucous membranes are normal. Posterior oropharyngeal edema and posterior oropharyngeal erythema present. No oropharyngeal exudate or tonsillar abscesses.  Eyes: Conjunctivae are normal.  Neck: Neck supple.  Cardiovascular: Normal rate, regular rhythm, normal heart sounds and intact distal pulses.   No murmur heard. Pulmonary/Chest: Effort normal and breath sounds normal. No respiratory distress. She has no wheezes. She has no rales.  Musculoskeletal: Normal range of motion. She exhibits no edema or tenderness.  Lymphadenopathy:    She has no cervical adenopathy.  Neurological: She is alert and oriented to person, place, and time. Coordination normal.  Skin: Skin is warm and dry. No rash noted. She is not diaphoretic.  Psychiatric: She has a normal mood and affect. Her behavior is normal.  Vitals reviewed.  Assessment & Plan:   Problem List Items Addressed This Visit    None    Visit Diagnoses    Acute bronchitis, unspecified organism    -  Primary   Relevant Medications   azithromycin (ZITHROMAX) 250 MG tablet   HYDROcodone-homatropine (HYCODAN) 5-1.5 MG/5ML syrup       Follow up plan: Return if symptoms worsen or fail to improve.  Counseling provided for all of the vaccine components No  orders of the defined types were placed in this encounter.   Caryl Pina, MD [facility] 07/13/2016, 4:36 PM

## 2016-07-14 DIAGNOSIS — H25812 Combined forms of age-related cataract, left eye: Secondary | ICD-10-CM | POA: Diagnosis not present

## 2016-07-14 DIAGNOSIS — D2312 Other benign neoplasm of skin of left eyelid, including canthus: Secondary | ICD-10-CM | POA: Diagnosis not present

## 2016-07-14 DIAGNOSIS — H029 Unspecified disorder of eyelid: Secondary | ICD-10-CM | POA: Diagnosis not present

## 2016-07-14 DIAGNOSIS — H25811 Combined forms of age-related cataract, right eye: Secondary | ICD-10-CM | POA: Diagnosis not present

## 2016-07-27 DIAGNOSIS — H25811 Combined forms of age-related cataract, right eye: Secondary | ICD-10-CM | POA: Diagnosis not present

## 2016-07-27 DIAGNOSIS — H2511 Age-related nuclear cataract, right eye: Secondary | ICD-10-CM | POA: Diagnosis not present

## 2016-07-27 HISTORY — PX: CATARACT EXTRACTION W/ INTRAOCULAR LENS IMPLANT: SHX1309

## 2016-08-01 ENCOUNTER — Telehealth: Payer: Self-pay | Admitting: Family Medicine

## 2016-08-01 MED ORDER — NEXIUM 40 MG PO CPDR
40.0000 mg | DELAYED_RELEASE_CAPSULE | Freq: Every day | ORAL | 3 refills | Status: DC
Start: 2016-08-01 — End: 2016-10-11

## 2016-08-01 NOTE — Telephone Encounter (Signed)
Pt notified name brand was sent into pharmacy

## 2016-08-01 NOTE — Telephone Encounter (Signed)
Script sent in per pt request

## 2016-08-03 DIAGNOSIS — H04123 Dry eye syndrome of bilateral lacrimal glands: Secondary | ICD-10-CM | POA: Diagnosis not present

## 2016-09-27 DIAGNOSIS — H2512 Age-related nuclear cataract, left eye: Secondary | ICD-10-CM | POA: Diagnosis not present

## 2016-09-27 DIAGNOSIS — H25812 Combined forms of age-related cataract, left eye: Secondary | ICD-10-CM | POA: Diagnosis not present

## 2016-10-05 DIAGNOSIS — H04123 Dry eye syndrome of bilateral lacrimal glands: Secondary | ICD-10-CM | POA: Diagnosis not present

## 2016-10-11 ENCOUNTER — Encounter: Payer: Self-pay | Admitting: Family Medicine

## 2016-10-11 ENCOUNTER — Ambulatory Visit (INDEPENDENT_AMBULATORY_CARE_PROVIDER_SITE_OTHER): Payer: Medicare Other | Admitting: Family Medicine

## 2016-10-11 VITALS — BP 125/75 | HR 72 | Temp 98.3°F | Ht 63.0 in | Wt 138.0 lb

## 2016-10-11 DIAGNOSIS — R5383 Other fatigue: Secondary | ICD-10-CM | POA: Diagnosis not present

## 2016-10-11 DIAGNOSIS — Z23 Encounter for immunization: Secondary | ICD-10-CM | POA: Diagnosis not present

## 2016-10-11 DIAGNOSIS — F3342 Major depressive disorder, recurrent, in full remission: Secondary | ICD-10-CM | POA: Diagnosis not present

## 2016-10-11 DIAGNOSIS — E785 Hyperlipidemia, unspecified: Secondary | ICD-10-CM | POA: Diagnosis not present

## 2016-10-11 DIAGNOSIS — Z1159 Encounter for screening for other viral diseases: Secondary | ICD-10-CM

## 2016-10-11 MED ORDER — SCOPOLAMINE 1 MG/3DAYS TD PT72: 1.0000 | MEDICATED_PATCH | TRANSDERMAL | 12 refills | Status: DC

## 2016-10-11 MED ORDER — ESOMEPRAZOLE MAGNESIUM 40 MG PO CPDR: 40.0000 mg | DELAYED_RELEASE_CAPSULE | Freq: Every day | ORAL | 6 refills | Status: DC

## 2016-10-11 NOTE — Progress Notes (Signed)
BP 125/75   Pulse 72   Temp 98.3 F (36.8 C) (Oral)   Ht 5' 3"  (1.6 m)   Wt 138 lb (62.6 kg)   BMI 24.45 kg/m    Subjective:    Patient ID: Selena Barnes, female    DOB: 1950/09/19, 66 y.o.   MRN: [id number]  HPI: Selena Barnes is a 66 y.o. female presenting on 10/11/2016 for Fatigue (sleeps well at night, every afternoon starts feeling tired; wants labwork) and Wants Scopolamine patch (going on a cruise)   HPI Fatigue and decreased energy Patient has been feeling more tired and less energy that's been going on for at least the past few months. She says she sleeps well at night and sleeps at least 7-8 hours but has been feeling more tired recently. She does admit that she had run out of her Cymbalta for a short time period and things that may be due to that but is coming in to get some lab work and make sure that that is all it is due to. She denies any major feelings of anxiety or depression currently and she is back on her Cymbalta. She denies any suicidal ideations or thoughts. Herself. She does admit that her husband says that she snores but she does not think that she stops breathing at night. She does have some daytime somnolence. Her depression is being managed well when she is on her Cymbalta she says.  Depression screen [facility] 2/9 10/11/2016 07/13/2016 12/28/2015 11/10/2015 05/18/2015  Decreased Interest 1 0 0 0 0  Down, Depressed, Hopeless 1 1 0 0 1  PHQ - 2 Score 2 1 0 0 1  Altered sleeping 2 - - - -  Tired, decreased energy 3 - - - -  Change in appetite 1 - - - -  Feeling bad or failure about yourself  0 - - - -  Trouble concentrating 0 - - - -  Moving slowly or fidgety/restless 0 - - - -  Suicidal thoughts 0 - - - -  PHQ-9 Score 8 - - - -  Difficult doing work/chores Not difficult at all - - - -    Hyperlipidemia Patient is coming in for recheck of his hyperlipidemia. The patient is currently taking Fish oils, she said that she stopped taking her Crestor, and she did not have  side effects to it she just stopped taking it. They deny any issues with myalgias or history of liver damage from it. They deny any focal numbness or weakness or chest pain.   Relevant past medical, surgical, family and social history reviewed and updated as indicated. Interim medical history since our last visit reviewed. Allergies and medications reviewed and updated.  Review of Systems  Constitutional: Negative for chills and fever.  Respiratory: Negative for chest tightness and shortness of breath.   Cardiovascular: Negative for chest pain and leg swelling.  Musculoskeletal: Negative for back pain and gait problem.  Skin: Negative for rash.  Neurological: Negative for dizziness, light-headedness and headaches.  Psychiatric/Behavioral: Positive for decreased concentration. Negative for agitation, behavioral problems and dysphoric mood. The patient is nervous/anxious.   All other systems reviewed and are negative.  Per HPI unless specifically indicated above     Objective:    BP 125/75   Pulse 72   Temp 98.3 F (36.8 C) (Oral)   Ht 5' 3"  (1.6 m)   Wt 138 lb (62.6 kg)   BMI 24.45 kg/m   Wt Readings from Last  3 Encounters:  10/11/16 138 lb (62.6 kg)  07/13/16 135 lb (61.2 kg)  12/28/15 143 lb 4 oz (65 kg)    Physical Exam  Constitutional: She is oriented to person, place, and time. She appears well-developed and well-nourished. No distress.  Eyes: Conjunctivae are normal.  Neck: Neck supple. No thyromegaly present.  Cardiovascular: Normal rate, regular rhythm, normal heart sounds and intact distal pulses.   No murmur heard. Pulmonary/Chest: Effort normal and breath sounds normal. No respiratory distress. She has no wheezes. She has no rales.  Musculoskeletal: Normal range of motion. She exhibits no edema.  Lymphadenopathy:    She has no cervical adenopathy.  Neurological: She is alert and oriented to person, place, and time. Coordination normal.  Skin: Skin is warm and  dry. No rash noted. She is not diaphoretic.  Psychiatric: Her behavior is normal. Judgment normal. Her mood appears anxious. She does not exhibit a depressed mood. She expresses no suicidal ideation. She expresses no suicidal plans.  Nursing note and vitals reviewed.     Assessment & Plan:   Problem List Items Addressed This Visit      Other   Depression   Relevant Orders   CBC with Differential/Platelet (Completed)   TSH (Completed)   Vitamin B12 (Completed)   VITAMIN D 25 Hydroxy (Vit-D Deficiency, Fractures) (Completed)   Hyperlipidemia with target LDL less than 100   Relevant Orders   Lipid panel (Completed)    Other Visit Diagnoses    Fatigue, unspecified type    -  Primary   Relevant Orders   CBC with Differential/Platelet (Completed)   CMP14+EGFR (Completed)   Lipid panel (Completed)   TSH (Completed)   Vitamin B12 (Completed)   VITAMIN D 25 Hydroxy (Vit-D Deficiency, Fractures) (Completed)   Need for hepatitis C screening test       Relevant Orders   Hepatitis C antibody (Completed)      Follow up plan: Return in about 1 year (around 10/11/2017), or if symptoms worsen or fail to improve.  Counseling provided for all of the vaccine components Orders Placed This Encounter  Procedures  . CBC with Differential/Platelet  . CMP14+EGFR  . Lipid panel  . TSH  . Vitamin B12  . VITAMIN D 25 Hydroxy (Vit-D Deficiency, Fractures)    Caryl Pina, MD [facility] 10/11/2016, 9:12 AM

## 2016-10-12 ENCOUNTER — Other Ambulatory Visit: Payer: Self-pay | Admitting: *Deleted

## 2016-10-12 LAB — CBC WITH DIFFERENTIAL/PLATELET
Basophils Absolute: 0.1 10*3/uL (ref 0.0–0.2)
Basos: 1 %
EOS (ABSOLUTE): 0.2 10*3/uL (ref 0.0–0.4)
Eos: 2 %
Hematocrit: 38.5 % (ref 34.0–46.6)
Hemoglobin: 12.7 g/dL (ref 11.1–15.9)
Immature Grans (Abs): 0 10*3/uL (ref 0.0–0.1)
Immature Granulocytes: 0 %
Lymphocytes Absolute: 2.1 10*3/uL (ref 0.7–3.1)
Lymphs: 29 %
MCH: 30.5 pg (ref 26.6–33.0)
MCHC: 33 g/dL (ref 31.5–35.7)
MCV: 93 fL (ref 79–97)
Monocytes Absolute: 0.6 10*3/uL (ref 0.1–0.9)
Monocytes: 8 %
Neutrophils Absolute: 4.3 10*3/uL (ref 1.4–7.0)
Neutrophils: 60 %
Platelets: 293 10*3/uL (ref 150–379)
RBC: 4.16 x10E6/uL (ref 3.77–5.28)
RDW: 12.9 % (ref 12.3–15.4)
WBC: 7.2 10*3/uL (ref 3.4–10.8)

## 2016-10-12 LAB — CMP14+EGFR
ALT: 12 IU/L (ref 0–32)
AST: 17 IU/L (ref 0–40)
Albumin/Globulin Ratio: 1.4 (ref 1.2–2.2)
Albumin: 4.3 g/dL (ref 3.6–4.8)
Alkaline Phosphatase: 75 IU/L (ref 39–117)
BUN/Creatinine Ratio: 17 (ref 12–28)
BUN: 12 mg/dL (ref 8–27)
Bilirubin Total: 0.3 mg/dL (ref 0.0–1.2)
CO2: 25 mmol/L (ref 20–29)
Calcium: 9.5 mg/dL (ref 8.7–10.3)
Chloride: 101 mmol/L (ref 96–106)
Creatinine, Ser: 0.7 mg/dL (ref 0.57–1.00)
GFR calc Af Amer: 105 mL/min/{1.73_m2} (ref >59)
GFR calc non Af Amer: 91 mL/min/{1.73_m2} (ref >59)
Globulin, Total: 3 g/dL (ref 1.5–4.5)
Glucose: 88 mg/dL (ref 65–99)
Potassium: 4.9 mmol/L (ref 3.5–5.2)
Sodium: 140 mmol/L (ref 134–144)
Total Protein: 7.3 g/dL (ref 6.0–8.5)

## 2016-10-12 LAB — TSH: TSH: 1.92 u[IU]/mL (ref 0.450–4.500)

## 2016-10-12 LAB — LIPID PANEL
Chol/HDL Ratio: 3.4 ratio (ref 0.0–4.4)
Cholesterol, Total: 226 mg/dL — ABNORMAL HIGH (ref 100–199)
HDL: 67 mg/dL (ref >39)
LDL Calculated: 149 mg/dL — ABNORMAL HIGH (ref 0–99)
Triglycerides: 50 mg/dL (ref 0–149)
VLDL Cholesterol Cal: 10 mg/dL (ref 5–40)

## 2016-10-12 LAB — VITAMIN D 25 HYDROXY (VIT D DEFICIENCY, FRACTURES): Vit D, 25-Hydroxy: 55 ng/mL (ref 30.0–100.0)

## 2016-10-12 LAB — VITAMIN B12: Vitamin B-12: 482 pg/mL (ref 232–1245)

## 2016-10-12 LAB — HEPATITIS C ANTIBODY: Hep C Virus Ab: 0.1 s/co ratio (ref 0.0–0.9)

## 2016-10-12 MED ORDER — ROSUVASTATIN CALCIUM 10 MG PO TABS: 10.0000 mg | ORAL_TABLET | Freq: Every day | ORAL | 1 refills | Status: DC

## 2016-10-12 MED ORDER — ROSUVASTATIN CALCIUM 10 MG PO TABS
10.0000 mg | ORAL_TABLET | Freq: Every day | ORAL | 1 refills | Status: DC
Start: 2016-10-12 — End: 2016-10-27

## 2016-10-19 DIAGNOSIS — H1013 Acute atopic conjunctivitis, bilateral: Secondary | ICD-10-CM | POA: Diagnosis not present

## 2016-10-26 ENCOUNTER — Telehealth: Payer: Self-pay | Admitting: Family Medicine

## 2016-10-27 ENCOUNTER — Other Ambulatory Visit: Payer: Self-pay

## 2016-10-27 DIAGNOSIS — H04123 Dry eye syndrome of bilateral lacrimal glands: Secondary | ICD-10-CM | POA: Diagnosis not present

## 2016-10-27 MED ORDER — ESOMEPRAZOLE MAGNESIUM 40 MG PO CPDR: 40.0000 mg | DELAYED_RELEASE_CAPSULE | Freq: Every day | ORAL | 0 refills | Status: DC

## 2016-10-27 MED ORDER — ROSUVASTATIN CALCIUM 10 MG PO TABS: 10.0000 mg | ORAL_TABLET | Freq: Every day | ORAL | 0 refills | Status: DC

## 2016-10-27 NOTE — Telephone Encounter (Signed)
changed

## 2016-10-30 MED ORDER — ROSUVASTATIN CALCIUM 10 MG PO TABS: 10.0000 mg | ORAL_TABLET | Freq: Every day | ORAL | 0 refills | Status: DC

## 2016-10-30 NOTE — Addendum Note (Signed)
Addended by: Antonietta Barcelona D on: 10/30/2016 09:56 AM   Modules accepted: Orders

## 2016-11-10 DIAGNOSIS — H04123 Dry eye syndrome of bilateral lacrimal glands: Secondary | ICD-10-CM | POA: Diagnosis not present

## 2016-11-14 ENCOUNTER — Encounter: Payer: Self-pay | Admitting: Family Medicine

## 2016-11-14 ENCOUNTER — Ambulatory Visit (INDEPENDENT_AMBULATORY_CARE_PROVIDER_SITE_OTHER): Payer: Medicare Other | Admitting: Family Medicine

## 2016-11-14 VITALS — BP 139/71 | HR 79 | Temp 98.2°F | Ht 63.0 in | Wt 142.0 lb

## 2016-11-14 DIAGNOSIS — Z23 Encounter for immunization: Secondary | ICD-10-CM

## 2016-11-14 DIAGNOSIS — Z0000 Encounter for general adult medical examination without abnormal findings: Secondary | ICD-10-CM

## 2016-11-14 NOTE — Patient Instructions (Signed)
Thank you for coming in today for your Annual Medicare Wellness Visit.  Things that we discussed today are included in this packet.  Create and/or bring a copy of your Living Will/ Advanced Directive into the office so that we may respect your wishes should an emergency occur.  Get the recommended life-saving vaccines we discussed today.  Get your mammogram/ colonoscopy/ DEXA scans as directed by your provider.  Make sure that your medications are organized and safely stored.  Remember to always ask for help if you forget when/ how to take your medications.  Make healthy food choices (Rich in fruits/ veggies/ lean meats and low in salt, sugar and fat)  Do something that you enjoy for at least 30 minutes every day to stay active (walking, gardening, swimming, etc). This will help you lower your risk of falls/ broken bones.  Be social, do puzzles/ crosswords.  These things help the mind stay young and lower your risk of developing dementia.  Make sure that your home is safe by checking your smoke detectors regularly and doing the things outlined below to lower your risk of falls.  Fall Prevention in the Home Falls can cause injuries and can affect people from all age groups. There are many simple things that you can do to make your home safe and to help prevent falls. What can I do on the outside of my home? Regularly repair the edges of walkways and driveways and fix any cracks. Remove high doorway thresholds. Trim any shrubbery on the main path into your home. Use bright outdoor lighting. Clear walkways of debris and clutter, including tools and rocks. Regularly check that handrails are securely fastened and in good repair. Both sides of any steps should have handrails. Install guardrails along the edges of any raised decks or porches. Have leaves, snow, and ice cleared regularly. Use sand or salt on walkways during winter months. In the garage, clean up any spills right away, including  grease or oil spills. What can I do in the bathroom? Use night lights. Install grab bars by the toilet and in the tub and shower. Do not use towel bars as grab bars. Use non-skid mats or decals on the floor of the tub or shower. If you need to sit down while you are in the shower, use a plastic, non-slip stool. Keep the floor dry. Immediately clean up any water that spills on the floor. Remove soap buildup in the tub or shower on a regular basis. Attach bath mats securely with double-sided non-slip rug tape. Remove throw rugs and other tripping hazards from the floor. What can I do in the bedroom? Use night lights. Make sure that a bedside light is easy to reach. Do not use oversized bedding that drapes onto the floor. Have a firm chair that has side arms to use for getting dressed. Remove throw rugs and other tripping hazards from the floor. What can I do in the kitchen? Clean up any spills right away. Avoid walking on wet floors. Place frequently used items in easy-to-reach places. If you need to reach for something above you, use a sturdy step stool that has a grab bar. Keep electrical cables out of the way. Do not use floor polish or wax that makes floors slippery. If you have to use wax, make sure that it is non-skid floor wax. Remove throw rugs and other tripping hazards from the floor. What can I do in the stairways? Do not leave any items on the stairs.   Make sure that there are handrails on both sides of the stairs. Fix handrails that are broken or loose. Make sure that handrails are as long as the stairways. Check any carpeting to make sure that it is firmly attached to the stairs. Fix any carpet that is loose or worn. Avoid having throw rugs at the top or bottom of stairways, or secure the rugs with carpet tape to prevent them from moving. Make sure that you have a light switch at the top of the stairs and the bottom of the stairs. If you do not have them, have them  installed. What are some other fall prevention tips? Wear closed-toe shoes that fit well and support your feet. Wear shoes that have rubber soles or low heels. When you use a stepladder, make sure that it is completely opened and that the sides are firmly locked. Have someone hold the ladder while you are using it. Do not climb a closed stepladder. Add color or contrast paint or tape to grab bars and handrails in your home. Place contrasting color strips on the first and last steps. Use mobility aids as needed, such as canes, walkers, scooters, and crutches. Turn on lights if it is dark. Replace any light bulbs that burn out. Set up furniture so that there are clear paths. Keep the furniture in the same spot. Fix any uneven floor surfaces. Choose a carpet design that does not hide the edge of steps of a stairway. Be aware of any and all pets. Review your medicines with your healthcare provider. Some medicines can cause dizziness or changes in blood pressure, which increase your risk of falling. Talk with your health care provider about other ways that you can decrease your risk of falls. This may include working with a physical therapist or trainer to improve your strength, balance, and endurance. This information is not intended to replace advice given to you by your health care provider. Make sure you discuss any questions you have with your health care provider. Document Released: 01/20/2002 Document Revised: 06/29/2015 Document Reviewed: 03/06/2014 Elsevier Interactive Patient Education  2017 [employer]  

## 2016-11-14 NOTE — Progress Notes (Addendum)
Subjective:    Selena Barnes is a 66 y.o. female who presents for Medicare Annual/Subsequent preventive examination.  Patient resides at home with her husband. She lives independently. She is completely independent in all ADLs. She has one child and 2 grandchildren. Her grandson will be getting married in December of this year. She has had no falls. She notes that she struggles with chronic migraine headaches, which sometimes cause fatigue but otherwise is in overall good health. Health is stable since last year. She has had bilateral cataract removal with artificial lenses placed this summer. She notes some difficulty with night driving but otherwise is seeing much better. She exercises at least 5 days per week. She has no concerns today.  Preventive Screening-Counseling & Management  Tobacco History  Smoking Status  . Never Smoker  Smokeless Tobacco  . Never Used    Vision: Dr Evelena Leyden for yearly exam.  Saw ophthalmologist in June and August, had symphony lenses placed.  [employer] in [place].  She reports good vision except at night.  She reports halos. On an antibiotic for 2 more weeks.  May need repeat surgery.  Dental: Saw today.  Dr Bobby Rumpf.  Exercise: at least 30 minutes daily, at least MWF.  Cardio, some resistance training.  Problems Prior to Visit 1. Migraine headache.    Current Problems (verified) Patient Active Problem List   Diagnosis Date Noted  . Constipation due to outlet dysfunction   . Rectal pain   . Osteopenia with high risk of fracture 02/05/2015  . BMI 26.0-26.9,adult 05/21/2014  . Heel spur 05/21/2014  . Migraines 12/20/2012  . Insomnia 12/20/2012  . Depression 12/20/2012  . GERD (gastroesophageal reflux disease) 12/20/2012  . Hyperlipidemia with target LDL less than 100 12/20/2012    Medications Prior to Visit Current Outpatient Prescriptions on File Prior to Visit  Medication Sig Dispense Refill  . aspirin 81 MG tablet Take 81 mg by mouth daily.    .  Calcium-Magnesium-Vitamin D (CITRACAL SLOW RELEASE PO) Take 1,200 mg by mouth 2 (two) times daily before a meal.    . DULoxetine (CYMBALTA) 60 MG capsule Take 1 capsule (60 mg total) by mouth 2 (two) times daily. 90 capsule 3  . esomeprazole (NEXIUM) 40 MG capsule Take 1 capsule (40 mg total) by mouth daily at 12 noon. 90 capsule 0  . estradiol (ESTRACE VAGINAL) 0.1 MG/GM vaginal cream Place 1 Applicatorful vaginally at bedtime. 127.5 g 3  . fish oil-omega-3 fatty acids 1000 MG capsule Take 2 g by mouth 2 (two) times daily.     . metaxalone (SKELAXIN) 800 MG tablet Take 1 tablet (800 mg total) by mouth 3 (three) times daily as needed. 90 tablet 3  . promethazine (PHENERGAN) 25 MG tablet Take 1 tablet (25 mg total) by mouth every 6 (six) hours as needed for nausea or vomiting. 30 tablet 3  . raloxifene (EVISTA) 60 MG tablet Take 1 tablet (60 mg total) by mouth daily. 90 tablet 3  . rizatriptan (MAXALT-MLT) 10 MG disintegrating tablet Take 1 tablet (10 mg total) by mouth as needed for migraine. May repeat in 2 hours if needed 18 tablet 3  . rosuvastatin (CRESTOR) 10 MG tablet Take 1 tablet (10 mg total) by mouth daily. 90 tablet 0  . scopolamine (TRANSDERM-SCOP, 1.5 MG,) 1 MG/3DAYS Place 1 patch (1.5 mg total) onto the skin every 3 (three) days. 10 patch 12  . topiramate (TOPAMAX) 100 MG tablet Take 1 tablet (100 mg total) by mouth  daily. 90 tablet 3  . zolpidem (AMBIEN) 10 MG tablet Take 0.5 tablets (5 mg total) by mouth at bedtime as needed for sleep. 30 tablet 2  . Cholecalciferol (VITAMIN D3) 2000 UNITS capsule Take 2,000 Units by mouth 2 (two) times daily.     No current facility-administered medications on file prior to visit.     Current Medications (verified) Current Outpatient Prescriptions  Medication Sig Dispense Refill  . aspirin 81 MG tablet Take 81 mg by mouth daily.    . Calcium-Magnesium-Vitamin D (CITRACAL SLOW RELEASE PO) Take 1,200 mg by mouth 2 (two) times daily before a meal.     . DULoxetine (CYMBALTA) 60 MG capsule Take 1 capsule (60 mg total) by mouth 2 (two) times daily. 90 capsule 3  . esomeprazole (NEXIUM) 40 MG capsule Take 1 capsule (40 mg total) by mouth daily at 12 noon. 90 capsule 0  . estradiol (ESTRACE VAGINAL) 0.1 MG/GM vaginal cream Place 1 Applicatorful vaginally at bedtime. 127.5 g 3  . fish oil-omega-3 fatty acids 1000 MG capsule Take 2 g by mouth 2 (two) times daily.     . metaxalone (SKELAXIN) 800 MG tablet Take 1 tablet (800 mg total) by mouth 3 (three) times daily as needed. 90 tablet 3  . promethazine (PHENERGAN) 25 MG tablet Take 1 tablet (25 mg total) by mouth every 6 (six) hours as needed for nausea or vomiting. 30 tablet 3  . raloxifene (EVISTA) 60 MG tablet Take 1 tablet (60 mg total) by mouth daily. 90 tablet 3  . rizatriptan (MAXALT-MLT) 10 MG disintegrating tablet Take 1 tablet (10 mg total) by mouth as needed for migraine. May repeat in 2 hours if needed 18 tablet 3  . rosuvastatin (CRESTOR) 10 MG tablet Take 1 tablet (10 mg total) by mouth daily. 90 tablet 0  . scopolamine (TRANSDERM-SCOP, 1.5 MG,) 1 MG/3DAYS Place 1 patch (1.5 mg total) onto the skin every 3 (three) days. 10 patch 12  . topiramate (TOPAMAX) 100 MG tablet Take 1 tablet (100 mg total) by mouth daily. 90 tablet 3  . zolpidem (AMBIEN) 10 MG tablet Take 0.5 tablets (5 mg total) by mouth at bedtime as needed for sleep. 30 tablet 2  . Cholecalciferol (VITAMIN D3) 2000 UNITS capsule Take 2,000 Units by mouth 2 (two) times daily.     No current facility-administered medications for this visit.      Allergies (verified) Patient has no known allergies.   PAST HISTORY  Family History Family History  Problem Relation Age of Onset  . Heart disease Mother 28       cause of death  . Heart disease Father 73       death  . Kidney disease Father   . Diabetes Brother   . Cancer Brother 70       bone marrow cancer  . Diabetes Grandchild        grandson    Social  History Social History  Substance Use Topics  . Smoking status: Never Smoker  . Smokeless tobacco: Never Used  . Alcohol use No     Are there smokers in your home (other than you)? No  Risk Factors Current exercise habits: Gym/ health club routine includes cardio, stair stepper , stationary bike, treadmill and walking on track .  Dietary issues discussed: balanced diet   Cardiac risk factors: advanced age (older than 81 for men, 27 for women), dyslipidemia and family history of premature cardiovascular disease.  Depression Screen (Note: if  answer to either of the following is "Yes", a more complete depression screening is indicated)   Over the past two weeks, have you felt down, depressed or hopeless? No  Over the past two weeks, have you felt little interest or pleasure in doing things? No  Have you lost interest or pleasure in daily life? No  Do you often feel hopeless? No  Do you cry easily over simple problems? No  Activities of Daily Living In your present state of health, do you have any difficulty performing the following activities?:  Driving? No Managing money?  No Feeding yourself? No Getting from bed to chair? No Climbing a flight of stairs? No Preparing food and eating?: No Bathing or showering? No Getting dressed: No Getting to the toilet? No Using the toilet:No Moving around from place to place: No In the past year have you fallen or had a near fall?:No   Are you sexually active?  Yes  Do you have more than one partner?  No  Hearing Difficulties: No Do you often ask people to speak up or repeat themselves? No Do you experience ringing or noises in your ears? No Do you have difficulty understanding soft or whispered voices? No   Do you feel that you have a problem with memory? Yes  Do you often misplace items? No  Do you feel safe at home?  Yes  Cognitive Testing  Alert? Yes  Normal Appearance?Yes  Oriented to person? Yes  Place? Yes   Time?  Yes  Recall of three objects?  Yes  Can perform simple calculations? Yes  Displays appropriate judgment?Yes  Can read the correct time from a watch face?Yes   Advanced Directives have been discussed with the patient? Yes; has one in chart.  List the Names of Other Physician/Practitioners you currently use: 1.  Dr Carlean Purl, [facility] 2.  Neurology on [employer] in [place].  Dr Lesli Albee, Chiropractor in [place] any recent [place] you may have received from other than [facility] providers in the past year (date may be approximate).  Immunization History  Administered Date(s) Administered  . Influenza,inj,Quad PF,6+ Mos 12/20/2012, 01/22/2014, 11/13/2014, 11/08/2015  . Pneumococcal Conjugate-13 10/11/2016  . Tdap 11/10/2015  . Zoster 06/20/2013    Screening Tests Health Maintenance  Topic Date Due  . INFLUENZA VACCINE  09/13/2016  . PNA vac Low Risk Adult (2 of 2 - PPSV23) 10/11/2017  . MAMMOGRAM  06/23/2018  . PAP SMEAR  12/28/2018  . COLONOSCOPY  07/26/2025  . TETANUS/TDAP  11/09/2025  . DEXA SCAN  Completed  . Hepatitis C Screening  Completed  . HIV Screening  Completed    All answers were reviewed with the patient and necessary referrals were made:  Ronnie Doss, DO   11/14/2016   History reviewed: allergies, current medications, past family history, past medical history, past social history, past surgical history and problem list  Review of Systems Pertinent items noted in HPI and remainder of comprehensive ROS otherwise negative.    Objective:     Body mass index is 25.15 kg/m. BP 139/71   Pulse 79   Temp 98.2 F (36.8 C) (Oral)   Ht 5\' 3"  (1.6 m)   Wt 142 lb (64.4 kg)   BMI 25.15 kg/m   General appearance: alert, cooperative, appears stated age and no distress Lungs: clear to auscultation bilaterally Heart: regular rate and rhythm, S1, S2 normal, no murmur, click, rub or gallop  Psych: mood stable, speech  normal, good eye contact,  thought content appropriate Neuro: no focal deficits, AOx3, follows commands  MMSE - Mini Mental State Exam 11/14/2016 11/10/2015 11/03/2014  Orientation to time 5 5 5   Orientation to Place 4 5 5   Registration 3 3 3   Attention/ Calculation 5 5 5   Recall 3 3 3   Language- name 2 objects 2 2 2   Language- repeat 1 1 1   Language- follow 3 step command 3 3 3   Language- read & follow direction 1 1 1   Write a sentence 1 1 1   Copy design 1 1 1   Total score 29 30 30        Assessment:     Annual Medicare wellness visit, subsequent. Patient overall is doing very well. She exercises daily. She has good support from family. No acute issues identified. She is well connected with specialists. She is up-to-date on everything except for her flu shot. She will be due for her DEXA in December.     Plan:     During the course of the visit the patient was educated and counseled about appropriate screening and preventive services including:    Influenza vaccine  Diet review for nutrition referral? Yes ____  Not Indicated _x___   Patient Instructions (the written plan) was given to the patient.  Medicare Attestation I have personally reviewed: The patient's medical and social history Their use of alcohol, tobacco or illicit drugs Their current medications and supplements The patient's functional ability including ADLs,fall risks, home safety risks, cognitive, and hearing and visual impairment Diet and physical activities Evidence for depression or mood disorders  The patient's weight, height, BMI, and vision screening information have been recorded in the chart.  I have made referrals, counseling, and provided education to the patient based on review of the above and I have provided the patient with a written personalized care plan for preventive services.     Ronnie Doss, DO   11/14/2016

## 2016-11-17 ENCOUNTER — Telehealth: Payer: Self-pay

## 2016-11-17 DIAGNOSIS — G43709 Chronic migraine without aura, not intractable, without status migrainosus: Secondary | ICD-10-CM

## 2016-11-17 DIAGNOSIS — IMO0002 Reserved for concepts with insufficient information to code with codable children: Secondary | ICD-10-CM

## 2016-11-17 DIAGNOSIS — M542 Cervicalgia: Secondary | ICD-10-CM

## 2016-11-17 NOTE — Telephone Encounter (Signed)
Okay to go ahead and do that referral

## 2016-11-17 NOTE — Telephone Encounter (Signed)
Wants a referral for PT next door for migraines with neck pain

## 2016-11-17 NOTE — Addendum Note (Signed)
Addended by: Marin Olp on: 11/17/2016 01:34 PM   Modules accepted: Orders

## 2016-11-27 ENCOUNTER — Ambulatory Visit: Payer: Medicare Other | Attending: Family Medicine | Admitting: Physical Therapy

## 2016-11-27 ENCOUNTER — Encounter: Payer: Self-pay | Admitting: Physical Therapy

## 2016-11-27 DIAGNOSIS — M542 Cervicalgia: Secondary | ICD-10-CM | POA: Insufficient documentation

## 2016-11-27 DIAGNOSIS — M6281 Muscle weakness (generalized): Secondary | ICD-10-CM | POA: Insufficient documentation

## 2016-11-27 NOTE — Patient Instructions (Signed)
[facility]  DRY NEEDLING CONSENT FORM   Trigger point dry needling is a physical therapy approach to treat Myofascial Pain and Dysfunction.  Dry Needling (DN) is a valuable and effective way to deactivate myofascial trigger points (muscle knots). It is skilled intervention that uses a thin filiform needle to penetrate the skin and stimulate underlying myofascial trigger points, muscular, and connective tissues for the management of neuromusculoskeletal pain and movement impairments.  A local twitch response (LTR) will be elicited.  This can sometimes feel like a deep ache in the muscle during the procedure. Multiple trigger points in multiple muscles can be treated during each treatment.  No medication of any kind is injected.   As with any medical treatment and procedure, there are possible adverse events.  While significant adverse events are uncommon, they do sometimes occur and must be considered prior to giving consent.  1. Dry needling often causes a "post needling soreness".  There can be an increase in pain from a couple of hours to 2-3 days, followed by an improvement in the overall pain state. 2. Any time a needle is used there is a risk of infection.  However, we are using new, sterile, and disposable needles; infections are extremely rare. 3. There is a possibility that you may bleed or bruise.  You may feel tired and some nausea following treatment. 4. There is a rare possibility of a pneumothorax (air in the chest cavity). 5. Allergic reaction to nickel in the stainless steel needle. 6. If a nerve is touched, it may cause paresthesia (a prickling/shock sensation) which is usually brief, but may continue for a couple of days.  Following treatment stay hydrated. Use heat if you are sore. Continue regular activities but not too vigorous initially after treatment for 24-48 hours.  Dry Needling is best when combined with other physical therapy interventions  such as strengthening, stretching and other therapeutic modalities.     PLEASE ANSWER THE FOLLOWING QUESTIONS:  Do you have a lack of sensation?   Y/N  Do you have a phobia or fear of needles  Y/N  Are you pregnant?    Y/N If yes:  How many weeks? _____  Do you have any implanted devices?  Y/N If yes:  Pacemaker/Spinal Cord         Stimulator/Deep Brain         Stimulator/Insulin          Pump/Other: ____________ Do you have any implants?   Y/N If yes:      Do you take any blood thinners?   Y/N If yes: Coumadin          (Warfarin)/Other:  Do you have a bleeding disorder?   Y/N If yes: What kind:   Do you take any immunosuppressants?  Y/N If yes:   What kind:   Do you take anti-inflammatories?   Y/N If yes: What kind:  Have you ever been diagnosed with Scoliosis? Y/N  Have you had back surgery?    Y/N If yes:         Laminectomy/Fusion/Other:   I have read, or had read to me, the above.  I have had the opportunity to ask any questions.  All of my questions have been answered to my satisfaction and I understand the risks involved with dry needling.  I consent to examination and treatment at [facility], including dry needling, of any and all of my involved and affected muscles.  [name], PT 11/27/16 9:00 AM [facility] [address] [place], [name], [address] Phone: [phone]   Fax:  [phone]

## 2016-11-27 NOTE — Therapy (Addendum)
[facility] [place], [name], [address] Phone: [phone]   Fax:  [phone]  Physical Therapy Evaluation  Patient Details  Name: Selena Barnes MRN: [id number] Date of Birth: [birth year] Referring Provider: Dr. Vonna Kotyk Dettinger  Encounter Date: 11/27/2016      PT End of Session - 11/27/16 0823    Visit Number 1   Number of Visits 18   Date for PT Re-Evaluation 01/08/17   Authorization Type g-code 10th visit   PT Start Time 0823   PT Stop Time 0913   PT Time Calculation (min) 50 min   Activity Tolerance Patient tolerated treatment well   Behavior During Therapy [facility] for tasks assessed/performed      Past Medical History:  Diagnosis Date  . Arthritis   . Constipation   . Depression   . GERD (gastroesophageal reflux disease)   . Hyperlipidemia   . Insomnia   . Migraines   . Osteopenia     Past Surgical History:  Procedure Laterality Date  . ABDOMINAL HYSTERECTOMY    . ANAL RECTAL MANOMETRY N/A 10/06/2015   Procedure: ANO RECTAL MANOMETRY;  Surgeon: Gatha Mayer, MD;  Location: [facility] END[facility]COPY;  Service: Endoscopy;  Laterality: N/A;  . BREAST BIOPSY Right   . BREAST EXCISIONAL BIOPSY    . CATARACT EXTRACTION W/ INTRAOCULAR LENS IMPLANT Bilateral 07/27/2016   and 09/27/2016, [employer] [facility] in [place]  . CATARACT EXTRACTION, BILATERAL    . CHOLECYSTECTOMY    . LYMPH NODE BIOPSY Left    left arm    There were no vitals filed for this visit.       Subjective Assessment - 11/27/16 0824    Subjective Patient has had migraines since 08/19/1976. She had catarct surgery in June and August 2018 and has had a headache ever since. The pain moves down into her neck and R shoulder.   Patient Stated Goals to get rid of pain and get back to normal activities   Currently in Pain? Yes   Pain Score 6    Pain Location Neck   Pain Orientation Right   Pain Descriptors / Indicators Throbbing   Pain Type Chronic  pain   Pain Radiating Towards right shoulder; intermittently to left neck/shoulder as well   Pain Onset More than a month ago   Pain Frequency Intermittent   Aggravating Factors  lights, noise   Pain Relieving Factors dark room, sleep   Effect of Pain on Daily Activities limited            [facility] PT Assessment - 11/27/16 0001      Assessment   Medical Diagnosis Neck Pain; chronic migraine   Referring Provider Dr. Vonna Kotyk Dettinger   Onset Date/Surgical Date 02/14/76     Precautions   Precautions Other (comment)   Precaution Comments Osteopenia      Balance Screen   Has the patient fallen in the past 6 months No   Has the patient had a decrease in activity level because of a fear of falling?  No   Is the patient reluctant to leave their home because of a fear of falling?  No     Posture/Postural Control   Posture Comments depressed left shoulder, tight L QL, forward head, sway back posture     ROM / Strength   AROM / PROM / Strength AROM;Strength     AROM   AROM Assessment Site Cervical   Cervical - Right Side Bend 10  Cervical - Left Side Bend 30   Cervical - Right Rotation 57   Cervical - Left Rotation 43     Strength   Overall Strength Comments cervical weakness 4-/5 all directions     Palpation   Palpation comment R UT and cspine into subocciptials             Objective measurements completed on examination: See above findings.                  PT Education - 11/27/16 1559    Education provided Yes   Education Details DN education and aftercare; HeP   Person(s) Educated Patient   Methods Explanation;Demonstration;Handout   Comprehension Verbalized understanding;Returned demonstration          PT Short Term Goals - 12/20/15 1404      PT SHORT TERM GOAL #1   Title independent with flexibility exercises   Time 4   Period Weeks   Status Achieved     PT SHORT TERM GOAL #2   Title understand correct toileting technique to relax  the pelvic floor   Time 4   Period Weeks   Status Achieved     PT SHORT TERM GOAL #3   Title understand how to perform abdominal massage to facilitate a bowel movement   Time 4   Period Weeks   Status Achieved     PT SHORT TERM GOAL #4   Title strain to have a bowel movement </= 25% due to relaxation of the pelvic floor   Time 4   Period Weeks   Status Achieved     PT SHORT TERM GOAL #5   Title pain after a bowel movement decreased >/= 25%   Time 4   Period Weeks   Status Achieved           PT Long Term Goals - 11/27/16 1559      PT LONG TERM GOAL #1   Title independent with HEP   Time 6   Period Weeks   Status New   Target Date 01/08/17     PT LONG TERM GOAL #2   Title Patient to report decreased frequency of migraines by 50% or more   Time 6   Period Weeks   Status New   Target Date 01/08/17     PT LONG TERM GOAL #3   Title Patient to demo functional cervical ROM to allow her to check blind spots while driving.   Time 6   Period Weeks   Status New   Target Date 01/08/17     PT LONG TERM GOAL #4   Title Patient to demo improved cervical strength to 4+/5 to aide with correct posture.   Time 6   Period Weeks   Status New   Target Date 01/08/17     PT LONG TERM GOAL #5   Title Patient to report decreased neck pain with ADLs to 2/10 or less.   Time 6   Period Weeks   Status New   Target Date 01/08/17      Treatment today consisted of manual therapy including TPDN (after patient was educated and gave her consent) followed by estim and heat to cspine.          Plan - 11/27/16 1553    Clinical Impression Statement Patient presents for a moderate complexity eval for neck pain and migraines. She has limited cervical ROM and strength as well as postural deficits likely contributing to her pain.  Patient responded well to TPDN today with ++localized twitch responses elicited. Patient will benefit from further PT to address her deficits.   History and  Personal Factors relevant to plan of care: chronic migraines, osteopenia, depression   Clinical Presentation Evolving   Clinical Presentation due to: worsening symptoms   Clinical Decision Making Moderate   Rehab Potential Good   PT Frequency 2x / week   PT Duration 6 weeks   PT Treatment/Interventions Electrical Stimulation;Ultrasound;Moist Heat;Therapeutic activities;Therapeutic exercise;Neuromuscular re-education;Patient/family education;Manual techniques;Dry needling;ADLs/Self Care Home Management   PT Next Visit Plan Assess DN; continue as indicated; cervical stretches/ROM, manual STW; cervical stab and modalities prn   PT Home Exercise Plan pain free ROM   Consulted and Agree with Plan of Care Patient      Patient will benefit from skilled therapeutic intervention in order to improve the following deficits and impairments:  Pain, Decreased strength, Decreased range of motion, Impaired flexibility  Visit Diagnosis: Cervicalgia - Plan: PT plan of care cert/re-cert      G-Codes - 44/01/02 1436    Functional Assessment Tool Used (Outpatient Only) FOTO score is 58% limitation   Functional Limitation Other PT primary   Other PT Primary Current Status (V2536) At least 40 percent but less than 60 percent impaired, limited or restricted   Other PT Primary Goal Status (U4403) At least 40 percent but less than 60 percent impaired, limited or restricted       Problem List Patient Active Problem List   Diagnosis Date Noted  . Constipation due to outlet dysfunction   . Rectal pain   . Osteopenia with high risk of fracture 02/05/2015  . BMI 26.0-26.9,adult 05/21/2014  . Heel spur 05/21/2014  . Migraines 12/20/2012  . Insomnia 12/20/2012  . Depression 12/20/2012  . GERD (gastroesophageal reflux disease) 12/20/2012  . Hyperlipidemia with target LDL less than 100 12/20/2012   [name] PT 11/27/2016, 4:06 PM  [facility] [address] [place], [name], [address] Phone: [phone]   Fax:  [phone]  Name: NIKIYAH FACKLER MRN: [id number] Date of Birth: 1950-08-11

## 2016-11-29 ENCOUNTER — Ambulatory Visit: Payer: Medicare Other | Admitting: Physical Therapy

## 2016-11-29 ENCOUNTER — Encounter: Payer: Self-pay | Admitting: Physical Therapy

## 2016-11-29 DIAGNOSIS — M542 Cervicalgia: Secondary | ICD-10-CM | POA: Diagnosis not present

## 2016-11-29 DIAGNOSIS — M6281 Muscle weakness (generalized): Secondary | ICD-10-CM | POA: Diagnosis not present

## 2016-11-29 NOTE — Therapy (Signed)
Selena Barnes, [name], [address] Phone: [phone]   Fax:  [phone]  Physical Therapy Treatment  Patient Details  Name: Selena Barnes MRN: [id number] Date of Birth: 06/07/1950 Referring Provider: Dr. Vonna Kotyk Dettinger  Encounter Date: 11/29/2016      PT End of Session - 11/29/16 0847    Visit Number 2   Number of Visits 12   Date for PT Re-Evaluation 01/08/17   Authorization Type g-code 10th visit   PT Start Time 0814   PT Stop Time 0902   PT Time Calculation (min) 48 min   Activity Tolerance Patient tolerated treatment well   Behavior During Therapy [facility] for tasks assessed/performed      Past Medical History:  Diagnosis Date  . Arthritis   . Constipation   . Depression   . GERD (gastroesophageal reflux disease)   . Hyperlipidemia   . Insomnia   . Migraines   . Osteopenia     Past Surgical History:  Procedure Laterality Date  . ABDOMINAL HYSTERECTOMY    . ANAL RECTAL MANOMETRY N/A 10/06/2015   Procedure: ANO RECTAL MANOMETRY;  Surgeon: Gatha Mayer, MD;  Location: [facility];  Service: Endoscopy;  Laterality: N/A;  . BREAST BIOPSY Right   . BREAST EXCISIONAL BIOPSY    . CATARACT EXTRACTION W/ INTRAOCULAR LENS IMPLANT Bilateral 07/27/2016   and 09/27/2016, [employer] in [place]  . CATARACT EXTRACTION, BILATERAL    . CHOLECYSTECTOMY    . LYMPH NODE BIOPSY Left    left arm    There were no vitals filed for this visit.      Subjective Assessment - 11/29/16 0817    Subjective Patient reported relief and improved motion in neck    Patient Stated Goals to get rid of pain and get back to normal activities   Currently in Pain? Yes   Pain Score 6    Pain Location Neck   Pain Orientation Right   Pain Descriptors / Indicators Throbbing   Pain Type Chronic pain   Pain Onset More than a month ago   Pain Frequency Intermittent   Aggravating Factors  movement   Pain Relieving  Factors rest, heat                         [facility] Adult PT Treatment/Exercise - 11/29/16 0001      Neck Exercises: Seated   Cervical Isometrics Flexion;Extension;Right lateral flexion;Left lateral flexion;5 secs;5 reps   Neck Retraction 10 reps;3 secs   Cervical Rotation Right;Left;5 reps   Other Seated Exercise scap retractions x20     Manual Therapy   Manual Therapy Soft tissue mobilization;Myofascial release   Soft tissue mobilization manual STW to bil cervical paraspinals esp right side and UT/levator area , sub occipitals                  PT Short Term Goals - 12/20/15 1404      PT SHORT TERM GOAL #1   Title independent with flexibility exercises   Time 4   Period Weeks   Status Achieved     PT SHORT TERM GOAL #2   Title understand correct toileting technique to relax the pelvic floor   Time 4   Period Weeks   Status Achieved     PT SHORT TERM GOAL #3   Title understand how to perform abdominal massage to facilitate a bowel movement   Time 4  Period Weeks   Status Achieved     PT SHORT TERM GOAL #4   Title strain to have a bowel movement </= 25% due to relaxation of the pelvic floor   Time 4   Period Weeks   Status Achieved     PT SHORT TERM GOAL #5   Title pain after a bowel movement decreased >/= 25%   Time 4   Period Weeks   Status Achieved           PT Long Term Goals - 11/29/16 0848      PT LONG TERM GOAL #1   Title independent with HEP   Time 6   Period Weeks   Status On-going     PT LONG TERM GOAL #2   Title Patient to report decreased frequency of migraines by 50% or more   Time 6   Period Weeks   Status On-going     PT LONG TERM GOAL #3   Title Patient to demo functional cervical ROM to allow her to check blind spots while driving.   Time 6   Period Weeks   Status On-going     PT LONG TERM GOAL #4   Title Patient to demo improved cervical strength to 4+/5 to aide with correct posture.   Time 6    Period Weeks   Status On-going     PT LONG TERM GOAL #5   Title Patient to report decreased neck pain with ADLs to 2/10 or less.   Time 6   Period Weeks   Status On-going               Plan - 11/29/16 0849    Clinical Impression Statement Patient tolerated treatment well today. Patient reported ongoing pain in neck with movement and felt some relief after last DN session. Today reviewed cervical ROM, self stretches and gentle self cervical stabilization exercises. Then manual STW to bil cervical area to reduce pain and tone. Patient had noted tightness and palpable pain. Current goals ongoing.    Rehab Potential Good   Clinical Impairments Affecting Rehab Potential None   PT Frequency 2x / week   PT Duration 6 weeks   PT Treatment/Interventions Electrical Stimulation;Ultrasound;Moist Heat;Therapeutic activities;Therapeutic exercise;Neuromuscular re-education;Patient/family education;Manual techniques;Dry needling;ADLs/Self Care Home Management   PT Next Visit Plan cont with POC for DN, cervical stretches/ROM, manual STW; cervical stab and modalities prn   Consulted and Agree with Plan of Care Patient      Patient will benefit from skilled therapeutic intervention in order to improve the following deficits and impairments:  Pain, Decreased strength, Decreased range of motion, Impaired flexibility  Visit Diagnosis: Cervicalgia     Problem List Patient Active Problem List   Diagnosis Date Noted  . Constipation due to outlet dysfunction   . Rectal pain   . Osteopenia with high risk of fracture 02/05/2015  . BMI 26.0-26.9,adult 05/21/2014  . Heel spur 05/21/2014  . Migraines 12/20/2012  . Insomnia 12/20/2012  . Depression 12/20/2012  . GERD (gastroesophageal reflux disease) 12/20/2012  . Hyperlipidemia with target LDL less than 100 12/20/2012    Shalev Helminiak P, PTA 11/29/2016, 9:05 AM  [facility] [place], [name], [address] Phone: [phone]   Fax:  [phone]  Name: Selena Barnes MRN: [id number] Date of Birth: 01/20/1951

## 2016-12-01 ENCOUNTER — Ambulatory Visit: Payer: Medicare Other | Admitting: Physical Therapy

## 2016-12-01 DIAGNOSIS — M542 Cervicalgia: Secondary | ICD-10-CM

## 2016-12-01 DIAGNOSIS — M6281 Muscle weakness (generalized): Secondary | ICD-10-CM | POA: Diagnosis not present

## 2016-12-01 NOTE — Patient Instructions (Signed)
  Flexibility: Upper Trapezius Stretch   Gently grasp right side of head while reaching behind back with other hand. Tilt head away until a gentle stretch is felt. Hold 30 seconds. Repeat 3 times per set. Do 2 sessions per day.  http://orth.exer.us/340   Levator Stretch   Grasp seat or sit on hand on side to be stretched. Turn head toward other side and look down. Use hand on head to gently stretch neck in that position. Hold _30___ seconds. Repeat on other side. Repeat 3 times. Do 2 sessions per day.  http://gt2.exer.us/30   Scapular Retraction (Standing)   With arms at sides, pinch shoulder blades together. Repeat 10 times per set. Do 1-3 sets per session. Do 2 sessions per day.  http://orth.exer.us/944     Flexibility: Neck Retraction   Pull head straight back, keeping eyes and jaw level. Hold 3-5 seconds. Repeat _10 times per set. Do 3-5  sessions per day.  http://orth.exer.us/344   Posture - Sitting   Sit upright, head facing forward. Try using a roll to support lower back. Keep shoulders relaxed, and avoid rounded back. Keep hips level with knees. Avoid crossing legs for long periods.   Selena Barnes, PT 12/01/16 8:47 AM [facility] [address] [place], [name], [address] Phone: [phone]   Fax:  [phone]

## 2016-12-01 NOTE — Therapy (Signed)
[facility] [place], [name], [address] Phone: [phone]   Fax:  [phone]  Physical Therapy Treatment  Patient Details  Name: Selena Barnes MRN: [id number] Date of Birth: [birth year] Referring Provider: Dr. Vonna Kotyk Dettinger  Encounter Date: 12/01/2016      Barnes End of Session - 12/01/16 0813    Visit Number 3   Number of Visits 18   Date for Barnes Re-Evaluation 01/08/17   Authorization Type g-code 10th visit   Barnes Start Time 0815   Barnes Stop Time 0912   Barnes Time Calculation (min) 57 min   Activity Tolerance Patient tolerated treatment well   Behavior During Therapy [facility] for tasks assessed/performed      Past Medical History:  Diagnosis Date  . Arthritis   . Constipation   . Depression   . GERD (gastroesophageal reflux disease)   . Hyperlipidemia   . Insomnia   . Migraines   . Osteopenia     Past Surgical History:  Procedure Laterality Date  . ABDOMINAL HYSTERECTOMY    . ANAL RECTAL MANOMETRY N/A 10/06/2015   Procedure: ANO RECTAL MANOMETRY;  Surgeon: Gatha Mayer, MD;  [facility]: [facility];  Service: Endoscopy;  Laterality: N/A;  . BREAST BIOPSY Right   . BREAST EXCISIONAL BIOPSY    . CATARACT EXTRACTION W/ INTRAOCULAR LENS IMPLANT Bilateral 07/27/2016   and 09/27/2016, [employer] in [place]  . CATARACT EXTRACTION, BILATERAL    . CHOLECYSTECTOMY    . LYMPH NODE BIOPSY Left    left arm    There were no vitals filed for this visit.      Subjective Assessment - 12/01/16 0813    Subjective Paient reports slight HA on R and when turns to the right gets pain down toward shoulder blade.   Currently in Pain? Yes   Pain Score 4    Pain Location Neck   Pain Orientation Right   Pain Descriptors / Indicators Throbbing   Pain Type Chronic pain                         [facility] Adult Barnes Treatment/Exercise - 12/01/16 0001      Neck Exercises: Seated   Neck Retraction 10 reps   Other Seated Exercise scap retractions x20     Neck Exercises: Prone   Other Prone Exercise POE neck diagonals x 10 each     Modalities   Modalities Electrical Stimulation;Moist Heat     Moist Heat Therapy   Number Minutes Moist Heat 15 Minutes   Moist Heat Location Cervical     Electrical Stimulation   Electrical Stimulation Location cervical/UT 80-150 Hz x 15 min IFC   Electrical Stimulation Goals Pain     Manual Therapy   Manual Therapy Soft tissue mobilization;Manual Traction   Soft tissue mobilization to c spine, UT and Levator scap Bil   Manual Traction supine 5 bouts x 20 seconds  decreased pain with R rotation afterwards     Neck Exercises: Stretches   Upper Trapezius Stretch 1 rep;30 seconds   Levator Stretch 1 rep;30 seconds  Bil          Trigger Point Dry Needling - 12/01/16 0837    Consent Given? Yes   Education Handout Provided No   Muscles Treated Upper Body Upper trapezius;Levator scapulae;Suboccipitals muscle group  also C2-4 ES; all Bil   Upper Trapezius Response Twitch reponse elicited;Palpable increased muscle length   SubOccipitals Response  Twitch response elicited;Palpable increased muscle length   Levator Scapulae Response Twitch response elicited;Palpable increased muscle length              Barnes Education - 12/01/16 1246    Education provided Yes   Education Details HEP   Person(s) Educated Patient   Methods Explanation;Demonstration;Verbal cues;Handout   Comprehension Verbalized understanding;Returned demonstration             Barnes Long Term Goals - 11/29/16 0848      Barnes LONG TERM GOAL #1   Title independent with HEP   Time 6   Period Weeks   Status On-going     Barnes LONG TERM GOAL #2   Title Patient to report decreased frequency of migraines by 50% or more   Time 6   Period Weeks   Status On-going     Barnes LONG TERM GOAL #3   Title Patient to demo functional cervical ROM to allow her to check blind spots while driving.    Time 6   Period Weeks   Status On-going     Barnes LONG TERM GOAL #4   Title Patient to demo improved cervical strength to 4+/5 to aide with correct posture.   Time 6   Period Weeks   Status On-going     Barnes LONG TERM GOAL #5   Title Patient to report decreased neck pain with ADLs to 2/10 or less.   Time 6   Period Weeks   Status On-going               Plan - 12/01/16 9518    Clinical Impression Statement Patient tolerated DN very well today as well as gentle manual traction in supine. Normal response to modalities.   Rehab Potential Good   Barnes Frequency 3x / week  start with 3x/wk and decrease to 2 as indicated   Barnes Duration 6 weeks   Barnes Treatment/Interventions Electrical Stimulation;Ultrasound;Moist Heat;Therapeutic activities;Therapeutic exercise;Neuromuscular re-education;Patient/family education;Manual techniques;Dry needling;ADLs/Self Care Home Management   Barnes Next Visit Plan cont with POC for DN, cervical stretches/ROM, manual STW; cervical stab and modalities prn   Barnes Home Exercise Plan pain free ROM, cervical tension stretches, cerv/scap retraction   Consulted and Agree with Plan of Care Patient      Patient will benefit from skilled therapeutic intervention in order to improve the following deficits and impairments:  Pain, Decreased strength, Decreased range of motion, Impaired flexibility  Visit Diagnosis: Cervicalgia - Plan: Barnes plan of care cert/re-cert     Problem List Patient Active Problem List   Diagnosis Date Noted  . Constipation due to outlet dysfunction   . Rectal pain   . Osteopenia with high risk of fracture 02/05/2015  . BMI 26.0-26.9,adult 05/21/2014  . Heel spur 05/21/2014  . Migraines 12/20/2012  . Insomnia 12/20/2012  . Depression 12/20/2012  . GERD (gastroesophageal reflux disease) 12/20/2012  . Hyperlipidemia with target LDL less than 100 12/20/2012    Selena Barnes 12/01/2016, 12:50 PM  [facility] [address] [place], [name], [address] Phone: [phone]   Fax:  [phone]  Name: Selena Barnes MRN: [id number] Date of Birth: 02-28-1950

## 2016-12-04 ENCOUNTER — Encounter: Payer: Self-pay | Admitting: Physical Therapy

## 2016-12-04 ENCOUNTER — Ambulatory Visit: Payer: Medicare Other | Admitting: Physical Therapy

## 2016-12-04 DIAGNOSIS — M542 Cervicalgia: Secondary | ICD-10-CM | POA: Diagnosis not present

## 2016-12-04 DIAGNOSIS — M6281 Muscle weakness (generalized): Secondary | ICD-10-CM | POA: Diagnosis not present

## 2016-12-04 NOTE — Therapy (Signed)
[facility] [place], [name], [address] Phone: [phone]   Fax:  [phone]  Physical Therapy Treatment  Patient Details  Name: Selena Barnes MRN: [id number] Date of Birth: 14-Jul-1950 Referring Provider: Dr. Vonna Kotyk Dettinger  Encounter Date: 12/04/2016      PT End of Session - 12/04/16 0815    Visit Number 4   Number of Visits 18   Date for PT Re-Evaluation 01/08/17   Authorization Type g-code 10th visit   PT Start Time 0732   PT Stop Time 0825   PT Time Calculation (min) 53 min   Activity Tolerance Patient tolerated treatment well   Behavior During Therapy [facility] for tasks assessed/performed      Past Medical History:  Diagnosis Date  . Arthritis   . Constipation   . Depression   . GERD (gastroesophageal reflux disease)   . Hyperlipidemia   . Insomnia   . Migraines   . Osteopenia     Past Surgical History:  Procedure Laterality Date  . ABDOMINAL HYSTERECTOMY    . ANAL RECTAL MANOMETRY N/A 10/06/2015   Procedure: ANO RECTAL MANOMETRY;  Surgeon: Gatha Mayer, MD;  Location: [facility];  Service: Endoscopy;  Laterality: N/A;  . BREAST BIOPSY Right   . BREAST EXCISIONAL BIOPSY    . CATARACT EXTRACTION W/ INTRAOCULAR LENS IMPLANT Bilateral 07/27/2016   and 09/27/2016, [employer] in [place]  . CATARACT EXTRACTION, BILATERAL    . CHOLECYSTECTOMY    . LYMPH NODE BIOPSY Left    left arm    There were no vitals filed for this visit.      Subjective Assessment - 12/04/16 0735    Subjective Patient reported improvement from last session yet ongoing headache   Patient Stated Goals to get rid of pain and get back to normal activities   Currently in Pain? Yes   Pain Score 2   up to 5/10 with movement   Pain Location Neck   Pain Orientation Right   Pain Descriptors / Indicators Discomfort;Sore   Pain Type Chronic pain   Pain Onset More than a month ago   Pain Frequency Intermittent   Aggravating Factors  movement   Pain Relieving Factors at rest                         [facility] Adult PT Treatment/Exercise - 12/04/16 0001      Modalities   Modalities Moist Heat;Electrical Stimulation;Ultrasound     Moist Heat Therapy   Number Minutes Moist Heat 15 Minutes   Moist Heat Location Cervical     Electrical Stimulation   Electrical Stimulation Location cervical/UT 80-150 Hz x 15 min IFC   Electrical Stimulation Goals Pain     Ultrasound   Ultrasound Location bil cervical paraspinal UT   Ultrasound Parameters 1.5w/cm2/50%/26mhz x75min   Ultrasound Goals Pain     Manual Therapy   Manual Therapy Soft tissue mobilization;Manual Traction   Manual therapy comments manual and IASTM   Soft tissue mobilization to c spine, UT and Levator scap Bil                     PT Long Term Goals - 11/29/16 0848      PT LONG TERM GOAL #1   Title independent with HEP   Time 6   Period Weeks   Status On-going     PT LONG TERM GOAL #2   Title Patient  to report decreased frequency of migraines by 50% or more   Time 6   Period Weeks   Status On-going     PT LONG TERM GOAL #3   Title Patient to demo functional cervical ROM to allow her to check blind spots while driving.   Time 6   Period Weeks   Status On-going     PT LONG TERM GOAL #4   Title Patient to demo improved cervical strength to 4+/5 to aide with correct posture.   Time 6   Period Weeks   Status On-going     PT LONG TERM GOAL #5   Title Patient to report decreased neck pain with ADLs to 2/10 or less.   Time 6   Period Weeks   Status On-going               Plan - 12/04/16 7209    Clinical Impression Statement Patient tolerated treatment well today. patient has reported some relief after sessions yet ongoing pain tightness and headaches daily. Patient reported doing HEP daiy. Patient currrent goals ongoing due to pain deficts.    Rehab Potential Good   Clinical Impairments  Affecting Rehab Potential None   PT Frequency 3x / week   PT Duration 6 weeks   PT Treatment/Interventions Electrical Stimulation;Ultrasound;Moist Heat;Therapeutic activities;Therapeutic exercise;Neuromuscular re-education;Patient/family education;Manual techniques;Dry needling;ADLs/Self Care Home Management   PT Next Visit Plan cont with POC for DN, cervical stretches/ROM, manual STW; cervical stab and modalities prn   Consulted and Agree with Plan of Care Patient      Patient will benefit from skilled therapeutic intervention in order to improve the following deficits and impairments:  Pain, Decreased strength, Decreased range of motion, Impaired flexibility  Visit Diagnosis: Cervicalgia     Problem List Patient Active Problem List   Diagnosis Date Noted  . Constipation due to outlet dysfunction   . Rectal pain   . Osteopenia with high risk of fracture 02/05/2015  . BMI 26.0-26.9,adult 05/21/2014  . Heel spur 05/21/2014  . Migraines 12/20/2012  . Insomnia 12/20/2012  . Depression 12/20/2012  . GERD (gastroesophageal reflux disease) 12/20/2012  . Hyperlipidemia with target LDL less than 100 12/20/2012    Jastin Fore P, PTA 12/04/2016, 8:33 AM  [facility] [place], [name], [address] Phone: [phone]   Fax:  [phone]  Name: ESRA FRANKOWSKI MRN: [id number] Date of Birth: 1950-07-16

## 2016-12-06 ENCOUNTER — Ambulatory Visit: Payer: Medicare Other | Admitting: Physical Therapy

## 2016-12-06 ENCOUNTER — Encounter: Payer: Self-pay | Admitting: Physical Therapy

## 2016-12-06 DIAGNOSIS — M6281 Muscle weakness (generalized): Secondary | ICD-10-CM | POA: Diagnosis not present

## 2016-12-06 DIAGNOSIS — M542 Cervicalgia: Secondary | ICD-10-CM

## 2016-12-06 NOTE — Therapy (Signed)
[facility] [place], [name], [address] Phone: [phone]   Fax:  [phone]  Physical Therapy Treatment  Patient Details  Name: Selena Barnes MRN: [id number] Date of Birth: 1950/10/22 Referring Provider: Dr. Vonna Kotyk Dettinger  Encounter Date: 12/06/2016      PT End of Session - 12/06/16 0733    Visit Number 5   Number of Visits 18   Date for PT Re-Evaluation 01/08/17   Authorization Type g-code 10th visit   PT Start Time 0733   PT Stop Time 0817   PT Time Calculation (min) 44 min   Activity Tolerance Patient tolerated treatment well   Behavior During Therapy [facility] for tasks assessed/performed      Past Medical History:  Diagnosis Date  . Arthritis   . Constipation   . Depression   . GERD (gastroesophageal reflux disease)   . Hyperlipidemia   . Insomnia   . Migraines   . Osteopenia     Past Surgical History:  Procedure Laterality Date  . ABDOMINAL HYSTERECTOMY    . ANAL RECTAL MANOMETRY N/A 10/06/2015   Procedure: ANO RECTAL MANOMETRY;  Surgeon: Gatha Mayer, MD;  [facility]: [facility];  Service: Endoscopy;  Laterality: N/A;  . BREAST BIOPSY Right   . BREAST EXCISIONAL BIOPSY    . CATARACT EXTRACTION W/ INTRAOCULAR LENS IMPLANT Bilateral 07/27/2016   and 09/27/2016, [employer] in [place]  . CATARACT EXTRACTION, BILATERAL    . CHOLECYSTECTOMY    . LYMPH NODE BIOPSY Left    left arm    There were no vitals filed for this visit.      Subjective Assessment - 12/06/16 0733    Subjective Reports that her neck is better. Patient very hopeful that DN will help next week.   Patient Stated Goals to get rid of pain and get back to normal activities   Currently in Pain? Yes   Pain Score 3    Pain Location Neck   Pain Orientation Right   Pain Descriptors / Indicators Tightness   Pain Type Chronic pain   Pain Onset More than a month ago            [facility] PT Assessment - 12/06/16 0001       Assessment   Medical Diagnosis Neck Pain; chronic migraine   Onset Date/Surgical Date 02/14/76     Precautions   Precautions Other (comment)   Precaution Comments Osteopenia                      [facility] Adult PT Treatment/Exercise - 12/06/16 0001      Modalities   Modalities Electrical Stimulation;Moist Heat;Ultrasound     Moist Heat Therapy   Number Minutes Moist Heat 15 Minutes   Moist Heat Location Cervical     Electrical Stimulation   Electrical Stimulation Location R cervical/ UT   Electrical Stimulation Action Pre-Mod   Electrical Stimulation Parameters 80-150 hz x15 min   Electrical Stimulation Goals Pain;Tone     Ultrasound   Ultrasound Location R UT/ cervical paraspinals   Ultrasound Parameters 1.5 w/cm2, 100%, 1 mhz x10 min   Ultrasound Goals Pain     Manual Therapy   Manual Therapy Myofascial release   Myofascial Release STW/IASTW/ TPR to R UT, cervical paraspinals, posterior scalenes to reduce tone and pain                     PT Long Term Goals -  11/29/16 0848      PT LONG TERM GOAL #1   Title independent with HEP   Time 6   Period Weeks   Status On-going     PT LONG TERM GOAL #2   Title Patient to report decreased frequency of migraines by 50% or more   Time 6   Period Weeks   Status On-going     PT LONG TERM GOAL #3   Title Patient to demo functional cervical ROM to allow her to check blind spots while driving.   Time 6   Period Weeks   Status On-going     PT LONG TERM GOAL #4   Title Patient to demo improved cervical strength to 4+/5 to aide with correct posture.   Time 6   Period Weeks   Status On-going     PT LONG TERM GOAL #5   Title Patient to report decreased neck pain with ADLs to 2/10 or less.   Time 6   Period Weeks   Status On-going               Plan - 12/06/16 0810    Clinical Impression Statement Patient continues to present with an increased amount of tone in R UT and TPs throughout  R UT and into cervical paraspinals as well. Patient initially very tender to palpation but able to tolerate moderate pressure for TPR and deeper tissue massage. Increased redness presented in the R UT region following manual therapy and IASTW. Normal modalities response noted following removal of the modalities.   Rehab Potential Good   Clinical Impairments Affecting Rehab Potential None   PT Frequency 3x / week   PT Duration 6 weeks   PT Treatment/Interventions Electrical Stimulation;Ultrasound;Moist Heat;Therapeutic activities;Therapeutic exercise;Neuromuscular re-education;Patient/family education;Manual techniques;Dry needling;ADLs/Self Care Home Management   PT Next Visit Plan cont with POC for DN, cervical stretches/ROM, manual STW; cervical stab and modalities prn   PT Home Exercise Plan pain free ROM, cervical tension stretches, cerv/scap retraction   Consulted and Agree with Plan of Care Patient      Patient will benefit from skilled therapeutic intervention in order to improve the following deficits and impairments:  Pain, Decreased strength, Decreased range of motion, Impaired flexibility  Visit Diagnosis: Cervicalgia     Problem List Patient Active Problem List   Diagnosis Date Noted  . Constipation due to outlet dysfunction   . Rectal pain   . Osteopenia with high risk of fracture 02/05/2015  . BMI 26.0-26.9,adult 05/21/2014  . Heel spur 05/21/2014  . Migraines 12/20/2012  . Insomnia 12/20/2012  . Depression 12/20/2012  . GERD (gastroesophageal reflux disease) 12/20/2012  . Hyperlipidemia with target LDL less than 100 12/20/2012    [name], PTA 12/06/2016, 8:20 AM  [facility] [address] [place], [name], [address] Phone: [phone]   Fax:  [phone]  Name: YASMINA CHICO MRN: [id number] Date of Birth: 10-26-1950

## 2016-12-07 ENCOUNTER — Encounter: Payer: Medicare Other | Admitting: Physical Therapy

## 2016-12-12 ENCOUNTER — Ambulatory Visit: Payer: Medicare Other | Admitting: Physical Therapy

## 2016-12-12 DIAGNOSIS — M542 Cervicalgia: Secondary | ICD-10-CM | POA: Diagnosis not present

## 2016-12-12 DIAGNOSIS — M6281 Muscle weakness (generalized): Secondary | ICD-10-CM

## 2016-12-12 NOTE — Patient Instructions (Signed)
RE-ALIGNMENT ROUTINE EXERCISES-OSTEOPROROSIS BASIC FOR POSTURAL CORRECTION   RE-ALIGNMENT Tips BENEFITS: 1.It helps to re-align the curves of the back and improve standing posture. 2.It allows the back muscles to rest and strengthen in preparation for more activity. FREQUENCY: Daily, even after weeks, months and years of more advanced exercises. START: 1.All exercises start in the same position: lying on the back, arms resting on the supporting surface, palms up and slightly away from the body, backs of hands down, knees bent, feet flat. 2.The head, neck, arms, and legs are supported according to specific instructions of your therapist. Copyright  VHI. All rights reserved.    1. Decompression Exercise: Basic.   Takes compression off the vertebral bodies; increases tolerance for lying on the back; helps relieve back pain   Lie on back on firm surface, knees bent, feet flat, arms turned up, out to sides (~35 degrees). Head neck and arms supported as necessary. Time _5-15__ minutes. Surface: floor     2. Shoulder Press  Strengthens upper back extensors and scapular retractors.   Press both shoulders down. Hold _2-3__ seconds. Repeat _3-5__ times. Surface: floor        3. Head Press With [place]  Strengthens neck extensors   Tuck chin SLIGHTLY toward chest, keep mouth closed. Feel weight on back of head. Increase weight by pressing head down. Hold _2-3__ seconds. Relax. Repeat 3-5___ times. Surface: floor     4. Leg Lengthener: stretches quadratus lumborum and hip flexors.  Strengthens quads and ankle dorsiflexors.  Leg Lengthener: Full    Straighten one leg. Pull toes AND forefoot toward knee, extend heel. Lengthen leg by pulling pelvis away from ribs. Hold __5_ seconds. Relax. Repeat 1 time. Re-bend knee. Do other leg. Each leg __5_ times. Surface: floor    Leg Lengthener / Leg Press Combo: Single Leg    Straighten one leg down to floor. Pull toes AND forefoot  toward knee; extend heel. Lengthen leg by pulling pelvis away from ribs. Press leg down. DO NOT BEND KNEE. Hold _5__ seconds. Relax leg. Repeat exercise 1 time. Relax leg. Re-bend knee. Repeat with other leg. Do 5 times  [name], PT 12/12/16 3:08 PM [facility] [address] [place], [name], [address] Phone: [phone]   Fax:  [phone]

## 2016-12-12 NOTE — Therapy (Signed)
[facility] [place], [name], [address] Phone: [phone]   Fax:  [phone]  Physical Therapy Treatment  Patient Details  Name: Selena Barnes MRN: [id number] Date of Birth: 17-Dec-1950 Referring Provider: Dr. Vonna Kotyk Dettinger  Encounter Date: 12/12/2016      PT End of Session - 12/12/16 1434    Visit Number 6   Number of Visits 18   Date for PT Re-Evaluation 01/08/17   Authorization Type g-code 10th visit   PT Start Time 1433   PT Stop Time 1533   PT Time Calculation (min) 60 min   Activity Tolerance Patient tolerated treatment well   Behavior During Therapy [facility] for tasks assessed/performed      Past Medical History:  Diagnosis Date  . Arthritis   . Constipation   . Depression   . GERD (gastroesophageal reflux disease)   . Hyperlipidemia   . Insomnia   . Migraines   . Osteopenia     Past Surgical History:  Procedure Laterality Date  . ABDOMINAL HYSTERECTOMY    . ANAL RECTAL MANOMETRY N/A 10/06/2015   Procedure: ANO RECTAL MANOMETRY;  Surgeon: Gatha Mayer, MD;  Location: [facility];  Service: Endoscopy;  Laterality: N/A;  . BREAST BIOPSY Right   . BREAST EXCISIONAL BIOPSY    . CATARACT EXTRACTION W/ INTRAOCULAR LENS IMPLANT Bilateral 07/27/2016   and 09/27/2016, [employer] in [place]  . CATARACT EXTRACTION, BILATERAL    . CHOLECYSTECTOMY    . LYMPH NODE BIOPSY Left    left arm    There were no vitals filed for this visit.      Subjective Assessment - 12/12/16 1434    Subjective Patient reports she was sick yesterday from her migraines and the entire R side of her neck is hurting today.    Patient Stated Goals to get rid of pain and get back to normal activities   Currently in Pain? Yes   Pain Score 4    Pain Location Neck   Pain Orientation Right   Pain Descriptors / Indicators Tightness   Pain Type Chronic pain   Pain Radiating Towards R shoulder/UT   Pain Onset More  than a month ago   Pain Frequency Intermittent   Aggravating Factors  movement   Pain Relieving Factors at rest    Effect of Pain on Daily Activities limited                         [facility] Adult PT Treatment/Exercise - 12/12/16 0001      Neck Exercises: Supine   Other Supine Exercise OP decompression exercises 5 x 5 sec each     Modalities   Modalities Electrical Stimulation;Moist Heat     Moist Heat Therapy   Number Minutes Moist Heat 15 Minutes   Moist Heat Location Cervical     Electrical Stimulation   Electrical Stimulation Location cervical/UT 80-150 Hz x 15 min IFC   Electrical Stimulation Goals Tone;Pain     Manual Therapy   Manual Therapy Soft tissue mobilization;Myofascial release   Soft tissue mobilization to Bil UT levator scap, R lats    Myofascial Release to R rhomboids and lower traps           Trigger Point Dry Needling - 12/12/16 1509    Consent Given? Yes   Education Handout Provided No   Muscles Treated Upper Body Upper trapezius;Suboccipitals muscle group;Levator scapulae  Bil; R lats also  Upper Trapezius Response Twitch reponse elicited;Palpable increased muscle length   SubOccipitals Response Twitch response elicited;Palpable increased muscle length   Levator Scapulae Response Twitch response elicited;Palpable increased muscle length              PT Education - 12/12/16 1520    Education provided Yes   Education Details HEP   Person(s) Educated Patient   Methods Explanation;Demonstration;Handout;Verbal cues   Comprehension Verbalized understanding;Returned demonstration             PT Long Term Goals - 11/29/16 0848      PT LONG TERM GOAL #1   Title independent with HEP   Time 6   Period Weeks   Status On-going     PT LONG TERM GOAL #2   Title Patient to report decreased frequency of migraines by 50% or more   Time 6   Period Weeks   Status On-going     PT LONG TERM GOAL #3   Title Patient to demo  functional cervical ROM to allow her to check blind spots while driving.   Time 6   Period Weeks   Status On-going     PT LONG TERM GOAL #4   Title Patient to demo improved cervical strength to 4+/5 to aide with correct posture.   Time 6   Period Weeks   Status On-going     PT LONG TERM GOAL #5   Title Patient to report decreased neck pain with ADLs to 2/10 or less.   Time 6   Period Weeks   Status On-going               Plan - 12/12/16 1524    Clinical Impression Statement Patient reports significant relief with DN reducing HA for 5 days after last treatment. Patient reports increased pain today following severe migraine yesterday and responded well to DN as previously. PT recommended DN more consistently as it has been 11 days. LTGs are ongoing.   Rehab Potential Good   Clinical Impairments Affecting Rehab Potential None   PT Frequency 3x / week   PT Duration 6 weeks   PT Treatment/Interventions Electrical Stimulation;Ultrasound;Moist Heat;Therapeutic activities;Therapeutic exercise;Neuromuscular re-education;Patient/family education;Manual techniques;Dry needling;ADLs/Self Care Home Management   PT Next Visit Plan cont with POC for DN, cervical stretches/ROM, manual STW; cervical stab and modalities prn   PT Home Exercise Plan decompression OP exercises; pain free ROM, cervical tension stretches, cerv/scap retraction   Consulted and Agree with Plan of Care Patient      Patient will benefit from skilled therapeutic intervention in order to improve the following deficits and impairments:  Pain, Decreased strength, Decreased range of motion, Impaired flexibility  Visit Diagnosis: Cervicalgia  Muscle weakness (generalized)     Problem List Patient Active Problem List   Diagnosis Date Noted  . Constipation due to outlet dysfunction   . Rectal pain   . Osteopenia with high risk of fracture 02/05/2015  . BMI 26.0-26.9,adult 05/21/2014  . Heel spur 05/21/2014  .  Migraines 12/20/2012  . Insomnia 12/20/2012  . Depression 12/20/2012  . GERD (gastroesophageal reflux disease) 12/20/2012  . Hyperlipidemia with target LDL less than 100 12/20/2012    [name] PT 12/12/2016, 3:31 PM  [facility] [address] [place], [name], [address] Phone: [phone]   Fax:  [phone]  Name: WENDELL FIEBIG MRN: [id number] Date of Birth: 1951-02-12

## 2016-12-14 ENCOUNTER — Ambulatory Visit: Payer: Medicare Other | Attending: Family Medicine | Admitting: Physical Therapy

## 2016-12-14 DIAGNOSIS — R293 Abnormal posture: Secondary | ICD-10-CM | POA: Diagnosis not present

## 2016-12-14 DIAGNOSIS — M6281 Muscle weakness (generalized): Secondary | ICD-10-CM | POA: Diagnosis not present

## 2016-12-14 DIAGNOSIS — M542 Cervicalgia: Secondary | ICD-10-CM | POA: Diagnosis not present

## 2016-12-14 NOTE — Therapy (Signed)
[facility] [place], [name], [address] Phone: [phone]   Fax:  [phone]  Physical Therapy Treatment  Patient Details  Name: Selena Barnes MRN: [id number] Date of Birth: 05-29-1950 Referring Provider: Dr. Vonna Kotyk Dettinger  Encounter Date: 12/14/2016      PT End of Session - 12/14/16 0949    Visit Number 7   Number of Visits 18   Date for PT Re-Evaluation 01/08/17   PT Start Time 0818   PT Stop Time 0916   PT Time Calculation (min) 58 min   Activity Tolerance Patient tolerated treatment well   Behavior During Therapy [facility] for tasks assessed/performed      Past Medical History:  Diagnosis Date  . Arthritis   . Constipation   . Depression   . GERD (gastroesophageal reflux disease)   . Hyperlipidemia   . Insomnia   . Migraines   . Osteopenia     Past Surgical History:  Procedure Laterality Date  . ABDOMINAL HYSTERECTOMY    . ANAL RECTAL MANOMETRY N/A 10/06/2015   Procedure: ANO RECTAL MANOMETRY;  Surgeon: Gatha Mayer, MD;  Location: [facility];  Service: Endoscopy;  Laterality: N/A;  . BREAST BIOPSY Right   . BREAST EXCISIONAL BIOPSY    . CATARACT EXTRACTION W/ INTRAOCULAR LENS IMPLANT Bilateral 07/27/2016   and 09/27/2016, [employer] in [place]  . CATARACT EXTRACTION, BILATERAL    . CHOLECYSTECTOMY    . LYMPH NODE BIOPSY Left    left arm    There were no vitals filed for this visit.      Subjective Assessment - 12/14/16 0949    Subjective Did very well with dry needling.     Pain Score 3    Pain Location Neck   Pain Orientation Right   Pain Descriptors / Indicators Tightness   Pain Type Chronic pain   Pain Onset More than a month ago                         [facility] Adult PT Treatment/Exercise - 12/14/16 0001      Modalities   Modalities Electrical Stimulation;Moist Heat     Moist Heat Therapy   Number Minutes Moist Heat 15 Minutes   Moist Heat  Location Cervical     Electrical Stimulation   Electrical Stimulation Location Right C-spine/UT.   Electrical Stimulation Action IFC at 80-150 Hz x 15 minutes.   Electrical Stimulation Goals Tone;Pain     Manual Therapy   Manual Therapy Soft tissue mobilization;Scapular mobilization   Soft tissue mobilization STW/M to right C-spine and right upper thoracic region including right scap mobs x 24 minutes.          Trigger Point Dry Needling - 12/14/16 0240    Consent Given? Yes   Education Handout Provided No   Muscles Treated Upper Body --  Splenius Capitus/Cervicus.   Upper Trapezius Response Twitch reponse elicited   SubOccipitals Response Twitch response elicited   Levator Scapulae Response Twitch response elicited                   PT Long Term Goals - 11/29/16 0848      PT LONG TERM GOAL #1   Title independent with HEP   Time 6   Period Weeks   Status On-going     PT LONG TERM GOAL #2   Title Patient to report decreased frequency of migraines by 50% or more  Time 6   Period Weeks   Status On-going     PT LONG TERM GOAL #3   Title Patient to demo functional cervical ROM to allow her to check blind spots while driving.   Time 6   Period Weeks   Status On-going     PT LONG TERM GOAL #4   Title Patient to demo improved cervical strength to 4+/5 to aide with correct posture.   Time 6   Period Weeks   Status On-going     PT LONG TERM GOAL #5   Title Patient to report decreased neck pain with ADLs to 2/10 or less.   Time 6   Period Weeks   Status On-going               Plan - 12/14/16 1003    Clinical Impression Statement Outstanding response to dry needling.  Patient couldn't believe how much better she felt.      Patient will benefit from skilled therapeutic intervention in order to improve the following deficits and impairments:     Visit Diagnosis: Cervicalgia  Muscle weakness (generalized)  Abnormal posture     Problem  List Patient Active Problem List   Diagnosis Date Noted  . Constipation due to outlet dysfunction   . Rectal pain   . Osteopenia with high risk of fracture 02/05/2015  . BMI 26.0-26.9,adult 05/21/2014  . Heel spur 05/21/2014  . Migraines 12/20/2012  . Insomnia 12/20/2012  . Depression 12/20/2012  . GERD (gastroesophageal reflux disease) 12/20/2012  . Hyperlipidemia with target LDL less than 100 12/20/2012    Nalda Shackleford, [place] MPT 12/14/2016, 10:04 AM  [facility] [address] [place], [name], [address] Phone: [phone]   Fax:  [phone]  Name: Selena Barnes MRN: [id number] Date of Birth: [birth year]

## 2016-12-18 ENCOUNTER — Ambulatory Visit: Payer: Medicare Other | Admitting: Physical Therapy

## 2016-12-18 DIAGNOSIS — M6281 Muscle weakness (generalized): Secondary | ICD-10-CM

## 2016-12-18 DIAGNOSIS — R293 Abnormal posture: Secondary | ICD-10-CM | POA: Diagnosis not present

## 2016-12-18 DIAGNOSIS — M542 Cervicalgia: Secondary | ICD-10-CM | POA: Diagnosis not present

## 2016-12-18 NOTE — Patient Instructions (Signed)
Prone on Elbows - Neck diagonals   Lie on elbows. Look from left elbow to over right shoulder 10 times, then from right elbow to over left shoulder 10 times. _10__ reps per set, 1-3__ sets per session. 2x per day.  Assess motion before and after.  http://ecce.exer.us/195   Copyright  VHI. All rights reserved.   Iliotibial Band Stretch, Side-Lying   Lie on Right side, back to edge of bed, top arm in front and pillow under your waist. Allow top leg to drape behind over edge. Hold 5-10 min.  1-2 x/day.   [name], PT 12/18/16 8:56 AM [facility] [address] [place], [name], [address] Phone: [phone]   Fax:  [phone]

## 2016-12-18 NOTE — Therapy (Signed)
[facility] [place], [name], [address] Phone: [phone]   Fax:  [phone]  Physical Therapy Treatment  Patient Details  Name: Selena Barnes MRN: [id number] Date of Birth: 03-13-1950 Referring Provider: Dr. Vonna Kotyk Dettinger   Encounter Date: 12/18/2016  PT End of Session - 12/18/16 0813    Visit Number  8    Number of Visits  18    Date for PT Re-Evaluation  01/08/17    Authorization Type  g-code 10th visit    PT Start Time  0815    PT Stop Time  0917    PT Time Calculation (min)  62 min    Activity Tolerance  Patient tolerated treatment well    Behavior During Therapy  [facility] for tasks assessed/performed       Past Medical History:  Diagnosis Date  . Arthritis   . Constipation   . Depression   . GERD (gastroesophageal reflux disease)   . Hyperlipidemia   . Insomnia   . Migraines   . Osteopenia     Past Surgical History:  Procedure Laterality Date  . ABDOMINAL HYSTERECTOMY    . BREAST BIOPSY Right   . BREAST EXCISIONAL BIOPSY    . CATARACT EXTRACTION W/ INTRAOCULAR LENS IMPLANT Bilateral 07/27/2016   and 09/27/2016, [employer] in [place]  . CATARACT EXTRACTION, BILATERAL    . CHOLECYSTECTOMY    . LYMPH NODE BIOPSY Left    left arm    There were no vitals filed for this visit.  Subjective Assessment - 12/18/16 0813    Subjective  Patient states she can't believe who much her neck has been relieved. She still has pressure in her head and can feel it mainly with extension and rotation. Patient reports 1-2 HA since 6 days ago.    Patient Stated Goals  to get rid of pain and get back to normal activities    Currently in Pain?  Yes    Pain Score  2     Pain Location  Neck    Pain Orientation  Right;Left    Pain Descriptors / Indicators  Discomfort    Pain Type  Chronic pain    Pain Radiating Towards  R shoullder    Pain Onset  More than a month ago    Pain Frequency  Intermittent    Aggravating Factors   extension/rotation                      [facility] Adult PT Treatment/Exercise - 12/18/16 0001      Neck Exercises: Seated   Other Seated Exercise  neck diagonals x 10 each      Neck Exercises: Sidelying   Other Sidelying Exercise  QL stretch over pillow      Neck Exercises: Prone   Other Prone Exercise  POE neck diagonals 2 x 10 each; 1 set after DN improvd extension after one set   improvd extension after one set     Modalities   Modalities  Electrical Stimulation;Moist Heat      Moist Heat Therapy   Number Minutes Moist Heat  15 Minutes    Moist Heat Location  Cervical      Electrical Stimulation   Electrical Stimulation Location  B neck and UT 80-150 Hz x 15 min IFC    Electrical Stimulation Goals  Tone;Pain      Manual Therapy   Manual Therapy  Soft tissue mobilization;Myofascial release  Soft tissue mobilization  to B c spine and UT/levator scap; L QL    Myofascial Release  TPR to L UT and levator scapulae       Trigger Point Dry Needling - 12/18/16 1107    Consent Given?  Yes    Education Handout Provided  No    Muscles Treated Upper Body  Upper trapezius;Oblique capitus;Suboccipitals muscle group;Levator scapulae;Quadratus Lumborum Bil except QL L side only   Bil except QL L side only   Upper Trapezius Response  Twitch reponse elicited;Palpable increased muscle length    Oblique Capitus Response  Twitch response elicited;Palpable increased muscle length    SubOccipitals Response  Twitch response elicited;Palpable increased muscle length    Levator Scapulae Response  Twitch response elicited;Palpable increased muscle length           PT Education - 12/18/16 1111    Education provided  Yes    Education Details  HEP    Person(s) Educated  Patient    Methods  Demonstration;Explanation;Verbal cues;Handout;Tactile cues    Comprehension  Verbalized understanding;Returned demonstration          PT Long Term Goals -  12/18/16 1114      PT LONG TERM GOAL #1   Title  independent with HEP    Time  6    Period  Weeks    Status  On-going      PT LONG TERM GOAL #2   Title  Patient to report decreased frequency of migraines by 50% or more    Baseline  1-2 migraines reported in past six days    Time  6    Period  Weeks    Status  On-going      PT LONG TERM GOAL #3   Title  Patient to demo functional cervical ROM to allow her to check blind spots while driving.    Baseline  functional ROM demonstrated after DN today    Period  Weeks    Status  On-going      PT LONG TERM GOAL #5   Title  Patient to report decreased neck pain with ADLs to 2/10 or less.    Baseline  2/10 pain or less in past six days.    Time  6    Period  Weeks    Status  On-going            Plan - 12/18/16 8676    Clinical Impression Statement  Patient is making significant progress toward goals. She has marked decrease in pain with cervical motion and increased quality of motion as well. She responded well to DN to L QL which was released to normalize posture and decrease pull on L upper quadrant which had ++ twitch response vs. R today.     Rehab Potential  Good    Clinical Impairments Affecting Rehab Potential  None    PT Frequency  3x / week    PT Duration  6 weeks    PT Treatment/Interventions  Electrical Stimulation;Ultrasound;Moist Heat;Therapeutic activities;Therapeutic exercise;Neuromuscular re-education;Patient/family education;Manual techniques;Dry needling;ADLs/Self Care Home Management    PT Next Visit Plan  Work SCM Bilaterally manually and/or DN; cont with POC for DN, cervical stretches/ROM, manual STW; cervical stab and modalities prn    PT Home Exercise Plan  QL stretch, neck diagonals; decompression OP exercises; pain free ROM, cervical tension stretches, cerv/scap retraction    Consulted and Agree with Plan of Care  Patient       Patient will benefit  from skilled therapeutic intervention in order to  improve the following deficits and impairments:  Pain, Decreased strength, Decreased range of motion, Impaired flexibility  Visit Diagnosis: Cervicalgia  Muscle weakness (generalized)  Abnormal posture     Problem List Patient Active Problem List   Diagnosis Date Noted  . Constipation due to outlet dysfunction   . Rectal pain   . Osteopenia with high risk of fracture 02/05/2015  . BMI 26.0-26.9,adult 05/21/2014  . Heel spur 05/21/2014  . Migraines 12/20/2012  . Insomnia 12/20/2012  . Depression 12/20/2012  . GERD (gastroesophageal reflux disease) 12/20/2012  . Hyperlipidemia with target LDL less than 100 12/20/2012    [name] PT 12/18/2016, 11:18 AM  [facility] [place], [name], [address] Phone: [phone]   Fax:  [phone]  Name: Selena Barnes MRN: [id number] Date of Birth: [birth year]

## 2016-12-20 ENCOUNTER — Ambulatory Visit: Payer: Medicare Other | Admitting: Physical Therapy

## 2016-12-20 ENCOUNTER — Encounter: Payer: Self-pay | Admitting: Physical Therapy

## 2016-12-20 DIAGNOSIS — M542 Cervicalgia: Secondary | ICD-10-CM | POA: Diagnosis not present

## 2016-12-20 DIAGNOSIS — R293 Abnormal posture: Secondary | ICD-10-CM | POA: Diagnosis not present

## 2016-12-20 DIAGNOSIS — M6281 Muscle weakness (generalized): Secondary | ICD-10-CM | POA: Diagnosis not present

## 2016-12-20 NOTE — Therapy (Signed)
[facility] [place], [name], [address] Phone: [phone]   Fax:  [phone]  Physical Therapy Treatment  Patient Details  Name: Selena Barnes MRN: [id number] Date of Birth: 21-Nov-1950 Referring Provider: Dr. Vonna Kotyk Dettinger   Encounter Date: 12/20/2016  PT End of Session - 12/20/16 0816    Visit Number  9    Number of Visits  18    Date for PT Re-Evaluation  01/08/17    Authorization Type  g-code 10th visit    PT Start Time  0819    PT Stop Time  0902    PT Time Calculation (min)  43 min    Activity Tolerance  Patient tolerated treatment well    Behavior During Therapy  [facility] for tasks assessed/performed       Past Medical History:  Diagnosis Date  . Arthritis   . Constipation   . Depression   . GERD (gastroesophageal reflux disease)   . Hyperlipidemia   . Insomnia   . Migraines   . Osteopenia     Past Surgical History:  Procedure Laterality Date  . ABDOMINAL HYSTERECTOMY    . BREAST BIOPSY Right   . BREAST EXCISIONAL BIOPSY    . CATARACT EXTRACTION W/ INTRAOCULAR LENS IMPLANT Bilateral 07/27/2016   and 09/27/2016, [employer] in [place]  . CATARACT EXTRACTION, BILATERAL    . CHOLECYSTECTOMY    . LYMPH NODE BIOPSY Left    left arm    There were no vitals filed for this visit.  Subjective Assessment - 12/20/16 0818    Subjective  "After 40 some years it will take some time." Reports being thankful for not having a migraine.    Patient Stated Goals  to get rid of pain and get back to normal activities    Currently in Pain?  Yes    Pain Score  3     Pain Location  Neck    Pain Orientation  Right;Left    Pain Descriptors / Indicators  Tightness;Throbbing    Pain Type  Chronic pain    Pain Onset  More than a month ago         [facility] PT Assessment - 12/20/16 0001      Assessment   Medical Diagnosis  Neck Pain; chronic migraine    Onset Date/Surgical Date  02/14/76       Precautions   Precautions  Other (comment)    Precaution Comments  Osteopenia                   [facility] Adult PT Treatment/Exercise - 12/20/16 0001      Modalities   Modalities  Electrical Stimulation;Moist Heat;Ultrasound      Moist Heat Therapy   Number Minutes Moist Heat  15 Minutes    Moist Heat Location  Cervical      Electrical Stimulation   Electrical Stimulation Location  B UT    Electrical Stimulation Action  Pre-Mod    Electrical Stimulation Parameters  80-150 hz x15 min    Electrical Stimulation Goals  Tone;Pain      Ultrasound   Ultrasound Location  R UT    Ultrasound Parameters  1.5 w/cm2, 100%, 1 mhz x10 min    Ultrasound Goals  Pain      Manual Therapy   Manual Therapy  Myofascial release    Myofascial Release  TPR/MFR to R UT, Levator Scapula, cervical paraspinals to reduce tone and pain  PT Long Term Goals - 12/20/16 0909      PT LONG TERM GOAL #1   Title  independent with HEP    Time  6    Period  Weeks    Status  Achieved      PT LONG TERM GOAL #2   Title  Patient to report decreased frequency of migraines by 50% or more    Baseline  1-2 migraines reported in past six days    Time  6    Period  Weeks    Status  On-going      PT LONG TERM GOAL #3   Title  Patient to demo functional cervical ROM to allow her to check blind spots while driving.    Baseline  functional ROM demonstrated after DN today    Period  Weeks    Status  On-going      PT LONG TERM GOAL #4   Title  Patient to demo improved cervical strength to 4+/5 to aide with correct posture.    Time  6    Period  Weeks    Status  On-going      PT LONG TERM GOAL #5   Title  Patient to report decreased neck pain with ADLs to 2/10 or less.    Baseline  2/10 pain or less in past six days.    Time  6    Period  Weeks    Status  On-going            Plan - 12/20/16 0856    Clinical Impression Statement  Patient tolerated today's treatment  well although still presenting in clinic with TPs throughout R UT and tightness into R cervical paraspinals. Patient does report increased mobility of cervical spine and continued tightness in R UT region but seeing improvement as she is reporting less migraines. TPs palpated throughout R UT especially along mid UT region. Normal modalities response noted following removal of the modalities.    Rehab Potential  Good    Clinical Impairments Affecting Rehab Potential  None    PT Frequency  3x / week    PT Duration  6 weeks    PT Treatment/Interventions  Electrical Stimulation;Ultrasound;Moist Heat;Therapeutic activities;Therapeutic exercise;Neuromuscular re-education;Patient/family education;Manual techniques;Dry needling;ADLs/Self Care Home Management    PT Next Visit Plan  Work SCM Bilaterally manually and/or DN; cont with POC for DN, cervical stretches/ROM, manual STW; cervical stab and modalities prn    PT Home Exercise Plan  QL stretch, neck diagonals; decompression OP exercises; pain free ROM, cervical tension stretches, cerv/scap retraction    Consulted and Agree with Plan of Care  Patient       Patient will benefit from skilled therapeutic intervention in order to improve the following deficits and impairments:  Pain, Decreased strength, Decreased range of motion, Impaired flexibility  Visit Diagnosis: Cervicalgia     Problem List Patient Active Problem List   Diagnosis Date Noted  . Constipation due to outlet dysfunction   . Rectal pain   . Osteopenia with high risk of fracture 02/05/2015  . BMI 26.0-26.9,adult 05/21/2014  . Heel spur 05/21/2014  . Migraines 12/20/2012  . Insomnia 12/20/2012  . Depression 12/20/2012  . GERD (gastroesophageal reflux disease) 12/20/2012  . Hyperlipidemia with target LDL less than 100 12/20/2012    [name], PTA 12/20/2016, 9:09 AM  [facility] [address] [place], [name],  [address] Phone: [phone]   Fax:  [phone]  Name: Selena Subramanian  Barnes MRN: [id number] Date of Birth: 04/24/1950

## 2016-12-22 ENCOUNTER — Encounter: Payer: Medicare Other | Admitting: Physical Therapy

## 2016-12-25 ENCOUNTER — Ambulatory Visit: Payer: Medicare Other | Admitting: Physical Therapy

## 2016-12-25 DIAGNOSIS — M542 Cervicalgia: Secondary | ICD-10-CM | POA: Diagnosis not present

## 2016-12-25 DIAGNOSIS — M6281 Muscle weakness (generalized): Secondary | ICD-10-CM | POA: Diagnosis not present

## 2016-12-25 DIAGNOSIS — R293 Abnormal posture: Secondary | ICD-10-CM | POA: Diagnosis not present

## 2016-12-25 NOTE — Therapy (Addendum)
[facility] [place], [name], [address] Phone: [phone]   Fax:  [phone]  Physical Therapy Treatment  Patient Details  Name: Selena Barnes MRN: [id number] Date of Birth: 04-23-1950 Referring Provider: Dr. Vonna Kotyk Dettinger   Encounter Date: 12/25/2016  PT End of Session - 12/25/16 1351    Visit Number  10    Number of Visits  18    Date for PT Re-Evaluation  01/08/17    Authorization Type  g-code 10th visit    PT Start Time  1350    PT Stop Time  1446    PT Time Calculation (min)  56 min    Activity Tolerance  Patient tolerated treatment well    Behavior During Therapy  [facility] for tasks assessed/performed       Past Medical History:  Diagnosis Date  . Arthritis   . Constipation   . Depression   . GERD (gastroesophageal reflux disease)   . Hyperlipidemia   . Insomnia   . Migraines   . Osteopenia     Past Surgical History:  Procedure Laterality Date  . ABDOMINAL HYSTERECTOMY    . BREAST BIOPSY Right   . BREAST EXCISIONAL BIOPSY    . CATARACT EXTRACTION W/ INTRAOCULAR LENS IMPLANT Bilateral 07/27/2016   and 09/27/2016, [employer] in [place]  . CATARACT EXTRACTION, BILATERAL    . CHOLECYSTECTOMY    . LYMPH NODE BIOPSY Left    left arm    There were no vitals filed for this visit.  Subjective Assessment - 12/25/16 1351    Subjective  Patient awoke this morning with a HA and has pain with looking up.    Patient Stated Goals  to get rid of pain and get back to normal activities    Currently in Pain?  Yes    Pain Score  9     Pain Location  Neck    Pain Orientation  Left    Pain Descriptors / Indicators  Throbbing    Pain Type  Chronic pain    Pain Onset  More than a month ago    Pain Frequency  Intermittent    Aggravating Factors   extension    Pain Relieving Factors  rest    Effect of Pain on Daily Activities  limited                      [facility] Adult PT  Treatment/Exercise - 12/25/16 0001      Modalities   Modalities  Electrical Stimulation;Moist Heat      Moist Heat Therapy   Number Minutes Moist Heat  15 Minutes    Moist Heat Location  Cervical      Electrical Stimulation   Electrical Stimulation Location  B cspine and UT    Electrical Stimulation Action  IFC 80-150 Hz x 15 min    Electrical Stimulation Goals  Tone;Pain      Manual Therapy   Manual Therapy  Joint mobilization;Soft tissue mobilization;Myofascial release    Joint Mobilization  gentle Gd I/II PA mobs to C2-5    Soft tissue mobilization  to B SCM, UT and cspine    Myofascial Release  suboccipital release; and TPR to R paraspinals       Trigger Point Dry Needling - 12/25/16 1530    Consent Given?  Yes    Education Handout Provided  No    Muscles Treated Upper Body  Upper trapezius;Suboccipitals muscle group cervical  multifidi C2-5 Bil    Upper Trapezius Response  Twitch reponse elicited;Palpable increased muscle length    SubOccipitals Response  Twitch response elicited;Palpable increased muscle length                PT Long Term Goals - January 07, 2017 1356      PT LONG TERM GOAL #1   Title  independent with HEP    Time  6    Period  Weeks    Status  Achieved      PT LONG TERM GOAL #2   Title  Patient to report decreased frequency of migraines by 50% or more    Baseline  1 migraines reported since last DN session    Time  6    Period  Weeks    Status  On-going      PT LONG TERM GOAL #3   Title  Patient to demo functional cervical ROM to allow her to check blind spots while driving.    Time  6    Period  Weeks    Status  Achieved      PT LONG TERM GOAL #5   Title  Patient to report decreased neck pain with ADLs to 2/10 or less.    Time  6    Period  Weeks    Status  On-going              Patient will benefit from skilled therapeutic intervention in order to improve the following deficits and impairments:     Visit  Diagnosis: Cervicalgia   G-Codes - 01/07/17 1556    Functional Assessment Tool Used (Outpatient Only)  FOTO score is 46% limitation    Functional Limitation  Other PT primary    Other PT Primary Current Status (N6295)  At least 40 percent but less than 60 percent impaired, limited or restricted    Other PT Primary Goal Status (M8413)  At least 40 percent but less than 60 percent impaired, limited or restricted       Problem List Patient Active Problem List   Diagnosis Date Noted  . Constipation due to outlet dysfunction   . Rectal pain   . Osteopenia with high risk of fracture 02/05/2015  . BMI 26.0-26.9,adult 05/21/2014  . Heel spur 05/21/2014  . Migraines 12/20/2012  . Insomnia 12/20/2012  . Depression 12/20/2012  . GERD (gastroesophageal reflux disease) 12/20/2012  . Hyperlipidemia with target LDL less than 100 12/20/2012    [name] PT January 07, 2017, 3:57 PM  [facility] [place], [name], [address] Phone: [phone]   Fax:  [phone]  Name: SARALEE BOLICK MRN: [id number] Date of Birth: November 07, 1950

## 2016-12-29 ENCOUNTER — Ambulatory Visit: Payer: Medicare Other | Admitting: Physical Therapy

## 2016-12-29 DIAGNOSIS — R293 Abnormal posture: Secondary | ICD-10-CM | POA: Diagnosis not present

## 2016-12-29 DIAGNOSIS — M6281 Muscle weakness (generalized): Secondary | ICD-10-CM | POA: Diagnosis not present

## 2016-12-29 DIAGNOSIS — M542 Cervicalgia: Secondary | ICD-10-CM

## 2016-12-29 NOTE — Therapy (Signed)
[facility] [place], [name], [address] Phone: [phone]   Fax:  [phone]  Physical Therapy Treatment  Patient Details  Name: Selena Barnes MRN: [id number] Date of Birth: May 26, 1950 Referring Provider: Dr. Vonna Kotyk Dettinger   Encounter Date: 12/29/2016  PT End of Session - 12/29/16 0817    Visit Number  11    Number of Visits  18    Date for PT Re-Evaluation  01/08/17    Authorization Type  g-code 10th visit    PT Start Time  0815    PT Stop Time  0914    PT Time Calculation (min)  59 min    Activity Tolerance  Patient tolerated treatment well    Behavior During Therapy  [facility] for tasks assessed/performed       Past Medical History:  Diagnosis Date  . Arthritis   . Constipation   . Depression   . GERD (gastroesophageal reflux disease)   . Hyperlipidemia   . Insomnia   . Migraines   . Osteopenia     Past Surgical History:  Procedure Laterality Date  . ABDOMINAL HYSTERECTOMY    . ANO RECTAL MANOMETRY N/A 10/06/2015   Performed by Gatha Mayer, MD at [place]  . BREAST BIOPSY Right   . BREAST EXCISIONAL BIOPSY    . CATARACT EXTRACTION W/ INTRAOCULAR LENS IMPLANT Bilateral 07/27/2016   and 09/27/2016, [employer] in [place]  . CATARACT EXTRACTION, BILATERAL    . CHOLECYSTECTOMY    . LYMPH NODE BIOPSY Left    left arm    There were no vitals filed for this visit.  Subjective Assessment - 12/29/16 0817    Subjective  Patient states she is much better. Still awakens every morning with a 4/10 headache, but it eases as she gets moving around. She denies pain in the neck today but HA is about a 2/10    Patient Stated Goals  to get rid of pain and get back to normal activities    Currently in Pain?  Yes    Pain Score  2     Pain Location  Neck    Pain Orientation  Anterior HA    Pain Descriptors / Indicators  Aching                      [facility] Adult PT  Treatment/Exercise - 12/29/16 0001      Neck Exercises: Supine   Neck Retraction  10 reps 2 sets one with chin to chest for dynamic subocc stretch      Modalities   Modalities  Electrical Stimulation;Moist Heat      Moist Heat Therapy   Number Minutes Moist Heat  15 Minutes    Moist Heat Location  Cervical      Electrical Stimulation   Electrical Stimulation Location  B cspine and UT    Electrical Stimulation Action  IFC 80-150 hz x 15 min    Electrical Stimulation Goals  Tone;Pain      Manual Therapy   Manual Therapy  Soft tissue mobilization;Myofascial release    Soft tissue mobilization  to B SCM, scalenes, UT, Levator scap, suboccipitals    Myofascial Release  to R levator scap      Neck Exercises: Stretches   Neck Stretch  1 rep;20 seconds Bil; with hand on clavicle for scalene stretch       Trigger Point Dry Needling - 12/29/16 0901    Consent Given?  Yes    Education Handout Provided  No    Muscles Treated Upper Body  Sternocleidomastoid;Upper trapezius;Oblique capitus;Suboccipitals muscle group;Levator scapulae Bil and R scalenes    Sternocleidomastoid Response  Twitch response elicited;Palpable increased muscle length    Upper Trapezius Response  Twitch reponse elicited;Palpable increased muscle length    Oblique Capitus Response  Twitch response elicited;Palpable increased muscle length    SubOccipitals Response  Twitch response elicited;Palpable increased muscle length    Levator Scapulae Response  Twitch response elicited;Palpable increased muscle length                PT Long Term Goals - 12/29/16 0820      PT LONG TERM GOAL #1   Title  independent with HEP    Time  6    Period  Weeks    Status  Achieved      PT LONG TERM GOAL #2   Title  Patient to report decreased frequency of migraines by 50% or more    Baseline  1 migraine per week as of 12/29/16    Time  6    Period  Weeks    Status  On-going      PT LONG TERM GOAL #3   Title  Patient  to demo functional cervical ROM to allow her to check blind spots while driving.    Baseline  functional ROM demonstrated after DN today    Time  6    Period  Weeks    Status  Achieved      PT LONG TERM GOAL #4   Title  Patient to demo improved cervical strength to 4+/5 to aide with correct posture.    Baseline  all 5/5 except flexion 4+/5    Time  6    Period  Weeks    Status  Achieved      PT LONG TERM GOAL #5   Title  Patient to report decreased neck pain with ADLs to 2/10 or less.    Baseline  2/10 since 4 days previous    Time  6    Period  Weeks    Status  On-going            Plan - 12/29/16 1244    Clinical Impression Statement  Patient was much improved today but still had significant TPs in L UT and levator as well as left scalenes and B suboccipitals. Great response to DN in these areas. Goals are progressing.    Rehab Potential  Good    PT Frequency  3x / week    PT Duration  6 weeks    PT Treatment/Interventions  Electrical Stimulation;Ultrasound;Moist Heat;Therapeutic activities;Therapeutic exercise;Neuromuscular re-education;Patient/family education;Manual techniques;Dry needling;ADLs/Self Care Home Management    PT Next Visit Plan  Work scalenes and SCM Bilaterally manually and/or DN; cont with POC for DN, cervical stretches/ROM, manual STW; cervical stab and modalities prn    PT Home Exercise Plan  QL stretch, neck diagonals; decompression OP exercises; pain free ROM, cervical tension stretches, cerv/scap retraction    Consulted and Agree with Plan of Care  Patient       Patient will benefit from skilled therapeutic intervention in order to improve the following deficits and impairments:  Pain, Decreased strength, Decreased range of motion, Impaired flexibility  Visit Diagnosis: Cervicalgia     Problem List Patient Active Problem List   Diagnosis Date Noted  . Constipation due to outlet dysfunction   . Rectal pain   . Osteopenia with  high risk of  fracture 02/05/2015  . BMI 26.0-26.9,adult 05/21/2014  . Heel spur 05/21/2014  . Migraines 12/20/2012  . Insomnia 12/20/2012  . Depression 12/20/2012  . GERD (gastroesophageal reflux disease) 12/20/2012  . Hyperlipidemia with target LDL less than 100 12/20/2012    [name] PT 12/29/2016, 12:47 PM  [facility] [address] [place], [name], [address] Phone: [phone]   Fax:  [phone]  Name: Selena Barnes MRN: [id number] Date of Birth: Aug 19, 1950

## 2017-01-02 ENCOUNTER — Ambulatory Visit: Payer: Medicare Other | Admitting: Physical Therapy

## 2017-01-02 ENCOUNTER — Encounter: Payer: Self-pay | Admitting: Physical Therapy

## 2017-01-02 DIAGNOSIS — M6281 Muscle weakness (generalized): Secondary | ICD-10-CM | POA: Diagnosis not present

## 2017-01-02 DIAGNOSIS — M542 Cervicalgia: Secondary | ICD-10-CM

## 2017-01-02 DIAGNOSIS — R293 Abnormal posture: Secondary | ICD-10-CM | POA: Diagnosis not present

## 2017-01-02 NOTE — Therapy (Signed)
[facility] [place], [name], [address] Phone: [phone]   Fax:  [phone]  Physical Therapy Treatment  Patient Details  Name: Selena Barnes MRN: [id number] Date of Birth: [birth year] Referring Provider: Dr. Vonna Kotyk Dettinger   Encounter Date: 01/02/2017  PT End of Session - 01/02/17 0737    Visit Number  12    Number of Visits  18    Date for PT Re-Evaluation  01/08/17    Authorization Type  g-code 10th visit    PT Start Time  0737    PT Stop Time  0820    PT Time Calculation (min)  43 min    Activity Tolerance  Patient tolerated treatment well    Behavior During Therapy  [facility] for tasks assessed/performed       Past Medical History:  Diagnosis Date  . Arthritis   . Constipation   . Depression   . GERD (gastroesophageal reflux disease)   . Hyperlipidemia   . Insomnia   . Migraines   . Osteopenia     Past Surgical History:  Procedure Laterality Date  . ABDOMINAL HYSTERECTOMY    . ANO RECTAL MANOMETRY N/A 10/06/2015   Performed by Gatha Mayer, MD at [place]  . BREAST BIOPSY Right   . BREAST EXCISIONAL BIOPSY    . CATARACT EXTRACTION W/ INTRAOCULAR LENS IMPLANT Bilateral 07/27/2016   and 09/27/2016, [employer] in [place]  . CATARACT EXTRACTION, BILATERAL    . CHOLECYSTECTOMY    . LYMPH NODE BIOPSY Left    left arm    There were no vitals filed for this visit.  Subjective Assessment - 01/02/17 0735    Subjective  Reports that DN has definately has helped cervical ROM. Reports that she still has neck pain. Reports that she wakes up with headaches just about everyday.    Patient Stated Goals  to get rid of pain and get back to normal activities    Currently in Pain?  Yes    Pain Score  2     Pain Location  Neck    Pain Orientation  Right    Pain Descriptors / Indicators  Discomfort    Pain Type  Chronic pain    Pain Radiating Towards  to R shoulder    Pain Onset  More than a  month ago         [facility] PT Assessment - 01/02/17 0001      Assessment   Medical Diagnosis  Neck Pain; chronic migraine    Onset Date/Surgical Date  02/14/76      Precautions   Precautions  Other (comment)    Precaution Comments  Osteopenia                   [facility] Adult PT Treatment/Exercise - 01/02/17 0001      Modalities   Modalities  Electrical Stimulation;Moist Heat;Ultrasound      Moist Heat Therapy   Number Minutes Moist Heat  15 Minutes    Moist Heat Location  Cervical      Electrical Stimulation   Electrical Stimulation Location  B cspine and UT    Electrical Stimulation Action  Pre-Mod    Electrical Stimulation Parameters  80-150 hz x15 min    Electrical Stimulation Goals  Tone;Pain      Ultrasound   Ultrasound Location  R UT/ cervical spine    Ultrasound Parameters  1.5 w/cm2, 100%, 1 mhz x10 min  Ultrasound Goals  Pain      Manual Therapy   Manual Therapy  Myofascial release    Myofascial Release  MFR/STW to R UT, Levator Scapula, cervical paraspinals, B posterior scalenes to reduce tone and pain                  PT Long Term Goals - 12/29/16 0820      PT LONG TERM GOAL #1   Title  independent with HEP    Time  6    Period  Weeks    Status  Achieved      PT LONG TERM GOAL #2   Title  Patient to report decreased frequency of migraines by 50% or more    Baseline  1 migraine per week as of 12/29/16    Time  6    Period  Weeks    Status  On-going      PT LONG TERM GOAL #3   Title  Patient to demo functional cervical ROM to allow her to check blind spots while driving.    Baseline  functional ROM demonstrated after DN today    Time  6    Period  Weeks    Status  Achieved      PT LONG TERM GOAL #4   Title  Patient to demo improved cervical strength to 4+/5 to aide with correct posture.    Baseline  all 5/5 except flexion 4+/5    Time  6    Period  Weeks    Status  Achieved      PT LONG TERM GOAL #5   Title  Patient to  report decreased neck pain with ADLs to 2/10 or less.    Baseline  2/10 since 4 days previous    Time  6    Period  Weeks    Status  On-going            Plan - 01/02/17 6712    Clinical Impression Statement  Patient presents in clinic with continued R cervical musculature tightness and tightness along B posterior scalenes as well. Patient also continues to report headaches still as she wakes up with them and uses a pillow with a hole in the middle. TPs throughout R UT and at superior angle of the R scapula with minimal release during manual therapy. Normal modalities response noted following removal of the modalities.    Rehab Potential  Good    Clinical Impairments Affecting Rehab Potential  None    PT Frequency  3x / week    PT Duration  6 weeks    PT Treatment/Interventions  Electrical Stimulation;Ultrasound;Moist Heat;Therapeutic activities;Therapeutic exercise;Neuromuscular re-education;Patient/family education;Manual techniques;Dry needling;ADLs/Self Care Home Management    PT Next Visit Plan  Work scalenes and SCM Bilaterally manually and/or DN; cont with POC for DN, cervical stretches/ROM, manual STW; cervical stab and modalities prn    PT Home Exercise Plan  QL stretch, neck diagonals; decompression OP exercises; pain free ROM, cervical tension stretches, cerv/scap retraction    Consulted and Agree with Plan of Care  Patient       Patient will benefit from skilled therapeutic intervention in order to improve the following deficits and impairments:  Pain, Decreased strength, Decreased range of motion, Impaired flexibility  Visit Diagnosis: Cervicalgia     Problem List Patient Active Problem List   Diagnosis Date Noted  . Constipation due to outlet dysfunction   . Rectal pain   . Osteopenia with high risk of  fracture 02/05/2015  . BMI 26.0-26.9,adult 05/21/2014  . Heel spur 05/21/2014  . Migraines 12/20/2012  . Insomnia 12/20/2012  . Depression 12/20/2012  . GERD  (gastroesophageal reflux disease) 12/20/2012  . Hyperlipidemia with target LDL less than 100 12/20/2012    Standley Brooking, PTA 01/02/2017, 8:24 AM  [facility] [address] [place], [name], [address] Phone: [phone]   Fax:  [phone]  Name: Selena Barnes MRN: [id number] Date of Birth: July 22, 1950

## 2017-01-08 ENCOUNTER — Ambulatory Visit: Payer: Medicare Other | Admitting: Physical Therapy

## 2017-01-08 DIAGNOSIS — M542 Cervicalgia: Secondary | ICD-10-CM

## 2017-01-08 DIAGNOSIS — R293 Abnormal posture: Secondary | ICD-10-CM | POA: Diagnosis not present

## 2017-01-08 DIAGNOSIS — M6281 Muscle weakness (generalized): Secondary | ICD-10-CM | POA: Diagnosis not present

## 2017-01-08 NOTE — Therapy (Signed)
[facility] [place], [name], [address] Phone: [phone]   Fax:  [phone]  Physical Therapy Treatment  Patient Details  Name: Selena Barnes MRN: [id number] Date of Birth: [birth year] Referring Provider: Dr. Vonna Kotyk Dettinger   Encounter Date: 01/08/2017  PT End of Session - 01/08/17 0809    Visit Number  13    Number of Visits  20    Date for PT Re-Evaluation  02/05/17    Authorization Type  g-code 20th visit    PT Start Time  0815    PT Stop Time  0915    PT Time Calculation (min)  60 min    Activity Tolerance  Patient tolerated treatment well    Behavior During Therapy  [facility] for tasks assessed/performed       Past Medical History:  Diagnosis Date  . Arthritis   . Constipation   . Depression   . GERD (gastroesophageal reflux disease)   . Hyperlipidemia   . Insomnia   . Migraines   . Osteopenia     Past Surgical History:  Procedure Laterality Date  . ABDOMINAL HYSTERECTOMY    . ANAL RECTAL MANOMETRY N/A 10/06/2015   Procedure: ANO RECTAL MANOMETRY;  Surgeon: Gatha Mayer, MD;  Location: [facility];  Service: Endoscopy;  Laterality: N/A;  . BREAST BIOPSY Right   . BREAST EXCISIONAL BIOPSY    . CATARACT EXTRACTION W/ INTRAOCULAR LENS IMPLANT Bilateral 07/27/2016   and 09/27/2016, [employer] in [place]  . CATARACT EXTRACTION, BILATERAL    . CHOLECYSTECTOMY    . LYMPH NODE BIOPSY Left    left arm    There were no vitals filed for this visit.  Subjective Assessment - 01/08/17 0809    Subjective  Patient states she is doing pretty well today, but had a HA from Saturday to Sunday EOD. Today patient has neck pain and a dull HA.    Patient Stated Goals  to get rid of pain and get back to normal activities    Currently in Pain?  Yes    Pain Score  3     Pain Location  Neck    Pain Orientation  Right    Pain Descriptors / Indicators  Aching;Discomfort    Pain Type  Chronic pain    Pain  Onset  More than a month ago    Pain Frequency  Intermittent    Aggravating Factors   end range ext.    Pain Relieving Factors  rest                      [facility] Adult PT Treatment/Exercise - 01/08/17 0001      Self-Care   Self-Care  Other Self-Care Comments    Other Self-Care Comments   discussed use of mindful apps to release tension during migraines as well as possibly seeing another PT to have scalenes DN.      Modalities   Modalities  Electrical Stimulation;Moist Heat      Moist Heat Therapy   Number Minutes Moist Heat  15 Minutes    Moist Heat Location  Cervical      Electrical Stimulation   Electrical Stimulation Location  B cspine and UT    Electrical Stimulation Action  IFC 80-150 Hz x 15 min    Electrical Stimulation Goals  Pain      Manual Therapy   Manual Therapy  Soft tissue mobilization;Myofascial release    Soft tissue mobilization  to B UT, scalees and cspine    Myofascial Release  to Bil UT and scalenes      Neck Exercises: Stretches   Chest Stretch  60 seconds;2 reps T position off mat in supine       Trigger Point Dry Needling - 01/08/17 1229    Consent Given?  Yes    Education Handout Provided  No    Muscles Treated Upper Body  Upper trapezius;Suboccipitals muscle group;Longissimus    Upper Trapezius Response  Twitch reponse elicited;Palpable increased muscle length    SubOccipitals Response  Twitch response elicited;Palpable increased muscle length    Longissimus Response  Twitch response elicited;Palpable increased muscle length Bil C3 and C4 multifidi                PT Long Term Goals - 01/08/17 0818      PT LONG TERM GOAL #1   Title  independent with HEP    Time  6    Period  Weeks    Status  Achieved      PT LONG TERM GOAL #2   Title  Patient to report decreased frequency of migraines by 50% or more    Baseline  3-4/week; no significant change    Time  6    Period  Weeks    Status  On-going      PT LONG TERM  GOAL #3   Title  Patient to demo functional cervical ROM to allow her to check blind spots while driving.    Time  6    Period  Weeks    Status  Achieved      PT LONG TERM GOAL #4   Title  Patient to demo improved cervical strength to 4+/5 to aide with correct posture.    Baseline  all 5/5 except flexion 4+/5    Time  6    Period  Weeks    Status  Achieved      PT LONG TERM GOAL #5   Title  Patient to report decreased neck pain with ADLs to 2/10 or less.    Baseline  0/10 pain after DN x 2 days, but pain returns up to 8/10 intermittently.    Time  6    Period  Weeks    Status  On-going            Plan - 01/08/17 1233    Clinical Impression Statement  Patient presents today feeling "pretty good". She reports significant improvement in neck pain to 0/10 following DN for at least 2 days. HA are better but continue and are likely contributing to return of pain in neck due to muscle guarding. PT discussed use of mindfullness apps to maintain gains with DN and possibly having scalenes needled by another PT skilled in this area.    PT Treatment/Interventions  Electrical Stimulation;Ultrasound;Moist Heat;Therapeutic activities;Therapeutic exercise;Neuromuscular re-education;Patient/family education;Manual techniques;Dry needling;ADLs/Self Care Home Management    PT Next Visit Plan  Work scalenes and SCM Bilaterally manually and/or DN; cont with POC for DN, cervical stretches/ROM, manual STW; cervical stab and modalities prn    PT Home Exercise Plan  QL stretch, neck diagonals; decompression OP exercises; pain free ROM, cervical tension stretches, cerv/scap retraction, supine chest stretch.       Patient will benefit from skilled therapeutic intervention in order to improve the following deficits and impairments:  Pain, Decreased strength, Decreased range of motion, Impaired flexibility  Visit Diagnosis: Cervicalgia     Problem List Patient Active  Problem List   Diagnosis Date  Noted  . Constipation due to outlet dysfunction   . Rectal pain   . Osteopenia with high risk of fracture 02/05/2015  . BMI 26.0-26.9,adult 05/21/2014  . Heel spur 05/21/2014  . Migraines 12/20/2012  . Insomnia 12/20/2012  . Depression 12/20/2012  . GERD (gastroesophageal reflux disease) 12/20/2012  . Hyperlipidemia with target LDL less than 100 12/20/2012    [name] PT 01/08/2017, 12:40 PM  [facility] [place], [name], [address] Phone: [phone]   Fax:  [phone]  Name: Selena Barnes MRN: [id number] Date of Birth: [birth year]

## 2017-01-08 NOTE — Addendum Note (Signed)
Addended by: Jaye Beagle on: 01/08/2017 04:04 PM   Modules accepted: Orders

## 2017-01-12 ENCOUNTER — Ambulatory Visit: Payer: Medicare Other | Admitting: Physical Therapy

## 2017-01-12 DIAGNOSIS — M6281 Muscle weakness (generalized): Secondary | ICD-10-CM

## 2017-01-12 DIAGNOSIS — M542 Cervicalgia: Secondary | ICD-10-CM

## 2017-01-12 DIAGNOSIS — R293 Abnormal posture: Secondary | ICD-10-CM | POA: Diagnosis not present

## 2017-01-12 NOTE — Therapy (Signed)
[facility] [place], [name], [address] Phone: [phone]   Fax:  [phone]  Physical Therapy Treatment  Patient Details  Name: Selena Barnes MRN: [id number] Date of Birth: 22-Dec-1950 Referring Provider: Dr. Vonna Kotyk Dettinger   Encounter Date: 01/12/2017  PT End of Session - 01/12/17 0821    Visit Number  14    Number of Visits  20    Authorization Type  g-code 20th visit    PT Start Time  0818    PT Stop Time  0915    PT Time Calculation (min)  57 min    Activity Tolerance  Patient tolerated treatment well    Behavior During Therapy  [facility] for tasks assessed/performed       Past Medical History:  Diagnosis Date  . Arthritis   . Constipation   . Depression   . GERD (gastroesophageal reflux disease)   . Hyperlipidemia   . Insomnia   . Migraines   . Osteopenia     Past Surgical History:  Procedure Laterality Date  . ABDOMINAL HYSTERECTOMY    . ANAL RECTAL MANOMETRY N/A 10/06/2015   Procedure: ANO RECTAL MANOMETRY;  Surgeon: Gatha Mayer, MD;  Location: [facility];  Service: Endoscopy;  Laterality: N/A;  . BREAST BIOPSY Right   . BREAST EXCISIONAL BIOPSY    . CATARACT EXTRACTION W/ INTRAOCULAR LENS IMPLANT Bilateral 07/27/2016   and 09/27/2016, [employer] in [place]  . CATARACT EXTRACTION, BILATERAL    . CHOLECYSTECTOMY    . LYMPH NODE BIOPSY Left    left arm    There were no vitals filed for this visit.  Subjective Assessment - 01/12/17 0821    Subjective  I've had a good week since the needling. She continues to have dull HA across her eyes in the morning. Usually lightens up during the day or else puts her to bed.    Patient Stated Goals  to get rid of pain and get back to normal activities    Currently in Pain?  Yes    Pain Score  1     Pain Orientation  Right    Pain Descriptors / Indicators  Aching;Discomfort                      [facility] Adult PT  Treatment/Exercise - 01/12/17 0001      Neck Exercises: Standing   Neck Retraction  10 reps form corrected    Other Standing Exercises  MFR to paraspinals using foam roller (thoracic/lumbar)      Neck Exercises: Prone   Neck Retraction  10 reps    W Back  10 reps    Shoulder Extension  20 reps    Rows  10 reps    Other Prone Exercise  T position      Modalities   Modalities  Electrical Stimulation;Moist Heat      Moist Heat Therapy   Number Minutes Moist Heat  15 Minutes    Moist Heat Location  Cervical      Electrical Stimulation   Electrical Stimulation Location  B cspine and UT    Electrical Stimulation Action  80-150 Hz IFC x 15 min    Electrical Stimulation Goals  Pain      Manual Therapy   Manual Therapy  Joint mobilization;Soft tissue mobilization    Joint Mobilization  gentle gd II/III PA central to cervical and thoracic    Soft tissue mobilization  to B  subocciptitals, Cerv/Thor/Lumbar paraspinals       Trigger Point Dry Needling - 01/12/17 9211    Consent Given?  Yes    Muscles Treated Upper Body  Suboccipitals muscle group    SubOccipitals Response  Twitch response elicited;Palpable increased muscle length L>R                PT Long Term Goals - 01/08/17 0818      PT LONG TERM GOAL #1   Title  independent with HEP    Time  6    Period  Weeks    Status  Achieved      PT LONG TERM GOAL #2   Title  Patient to report decreased frequency of migraines by 50% or more    Baseline  3-4/week; no significant change    Time  6    Period  Weeks    Status  On-going      PT LONG TERM GOAL #3   Title  Patient to demo functional cervical ROM to allow her to check blind spots while driving.    Time  6    Period  Weeks    Status  Achieved      PT LONG TERM GOAL #4   Title  Patient to demo improved cervical strength to 4+/5 to aide with correct posture.    Baseline  all 5/5 except flexion 4+/5    Time  6    Period  Weeks    Status  Achieved      PT  LONG TERM GOAL #5   Title  Patient to report decreased neck pain with ADLs to 2/10 or less.    Baseline  0/10 pain after DN x 2 days, but pain returns up to 8/10 intermittently.    Time  6    Period  Weeks    Status  On-going            Plan - 01/12/17 0911    Clinical Impression Statement  Patient doing very well with pain control this week. Increased tone noted in subocciptals L>R. Review of chin tucks revealed patient was doing hyperflexion upon return from retraction. Form was corrected and pt had ++ twitch response in L subocciitals. She did very well with axial extension exercises.    Rehab Potential  Good    PT Frequency  3x / week    PT Duration  6 weeks    PT Treatment/Interventions  Electrical Stimulation;Ultrasound;Moist Heat;Therapeutic activities;Therapeutic exercise;Neuromuscular re-education;Patient/family education;Manual techniques;Dry needling;ADLs/Self Care Home Management    PT Next Visit Plan  Assess response to new exercises.     PT Home Exercise Plan  QL stretch, neck diagonals; decompression OP exercises; pain free ROM, cervical tension stretches, cerv/scap retraction, supine chest stretch. Axial extension in prone.    Consulted and Agree with Plan of Care  Patient       Patient will benefit from skilled therapeutic intervention in order to improve the following deficits and impairments:  Pain, Decreased strength, Decreased range of motion, Impaired flexibility  Visit Diagnosis: Muscle weakness (generalized)  Cervicalgia     Problem List Patient Active Problem List   Diagnosis Date Noted  . Constipation due to outlet dysfunction   . Rectal pain   . Osteopenia with high risk of fracture 02/05/2015  . BMI 26.0-26.9,adult 05/21/2014  . Heel spur 05/21/2014  . Migraines 12/20/2012  . Insomnia 12/20/2012  . Depression 12/20/2012  . GERD (gastroesophageal reflux disease) 12/20/2012  . Hyperlipidemia with  target LDL less than 100 12/20/2012    [name] PT 01/12/2017, 10:38 AM  [facility] [address] [place], [name], [address] Phone: [phone]   Fax:  [phone]  Name: Selena Barnes MRN: [id number] Date of Birth: Jun 24, 1950

## 2017-01-12 NOTE — Patient Instructions (Signed)
Axial Extension- Upper body sequence  Press hips down toward table and hold.   Lie on stomach with forehead resting on floor and arms at sides. Tuck chin in and raise head from floor without bending it up or down. Repeat ___10_ times per set. Do __1__ sets per session. Do _1___ sessions per day.  Progression:  Arms at side Arms in T shape Arms in W shape  Arms in M shape (row)   [name], PT 01/12/17 8:46 AM [facility] [address] [place], [name], [address] Phone: [phone]   Fax:  [phone]

## 2017-01-15 ENCOUNTER — Other Ambulatory Visit: Payer: Self-pay

## 2017-01-15 ENCOUNTER — Ambulatory Visit: Payer: Medicare Other | Attending: Family Medicine | Admitting: Physical Therapy

## 2017-01-15 DIAGNOSIS — M542 Cervicalgia: Secondary | ICD-10-CM

## 2017-01-15 DIAGNOSIS — M25551 Pain in right hip: Secondary | ICD-10-CM | POA: Insufficient documentation

## 2017-01-15 DIAGNOSIS — R29898 Other symptoms and signs involving the musculoskeletal system: Secondary | ICD-10-CM | POA: Diagnosis not present

## 2017-01-15 DIAGNOSIS — M6281 Muscle weakness (generalized): Secondary | ICD-10-CM | POA: Diagnosis not present

## 2017-01-15 DIAGNOSIS — R293 Abnormal posture: Secondary | ICD-10-CM

## 2017-01-15 DIAGNOSIS — R278 Other lack of coordination: Secondary | ICD-10-CM | POA: Insufficient documentation

## 2017-01-15 NOTE — Therapy (Signed)
[facility] [place], [name], [address] Phone: [phone]   Fax:  [phone]  Physical Therapy Treatment  Patient Details  Name: Selena Barnes MRN: [id number] Date of Birth: September 11, 1950 Referring Provider: Dr. Vonna Kotyk Dettinger   Encounter Date: 01/15/2017  PT End of Session - 01/15/17 1352    Visit Number  15    Number of Visits  20    Date for PT Re-Evaluation  02/05/17    Authorization Type  g-code 20th visit    PT Start Time  1350    PT Stop Time  1455    PT Time Calculation (min)  65 min    Activity Tolerance  Patient tolerated treatment well    Behavior During Therapy  [facility] for tasks assessed/performed       Past Medical History:  Diagnosis Date  . Arthritis   . Constipation   . Depression   . GERD (gastroesophageal reflux disease)   . Hyperlipidemia   . Insomnia   . Migraines   . Osteopenia     Past Surgical History:  Procedure Laterality Date  . ABDOMINAL HYSTERECTOMY    . ANAL RECTAL MANOMETRY N/A 10/06/2015   Procedure: ANO RECTAL MANOMETRY;  Surgeon: Gatha Mayer, MD;  Location: [facility];  Service: Endoscopy;  Laterality: N/A;  . BREAST BIOPSY Right   . BREAST EXCISIONAL BIOPSY    . CATARACT EXTRACTION W/ INTRAOCULAR LENS IMPLANT Bilateral 07/27/2016   and 09/27/2016, [employer] in [place]  . CATARACT EXTRACTION, BILATERAL    . CHOLECYSTECTOMY    . LYMPH NODE BIOPSY Left    left arm    There were no vitals filed for this visit.  Subjective Assessment - 01/15/17 1353    Subjective  Patient reports 70% improvement overall. Patient states today she has some low back pain and a pain in the R shoulder due to bringing out Christmas decorations. She denies pain in her neck.    Patient Stated Goals  to get rid of pain and get back to normal activities    Currently in Pain?  Yes    Pain Score  8     Pain Location  Shoulder    Pain Orientation  Right    Pain Descriptors /  Indicators  Aching         [facility] PT Assessment - 01/15/17 0001      Posture/Postural Control   Posture Comments  Elevated R shoulder and trunk in R rotation                  [facility] Adult PT Treatment/Exercise - 01/15/17 0001      Self-Care   Self-Care  Other Self-Care Comments    Other Self-Care Comments   Reviewed some mindfulness meditation apps that may be useful for relaxing body during migraines      Neck Exercises: Prone   Neck Retraction  5 reps    W Back  5 reps    Shoulder Extension  5 reps    Rows  5 reps    Other Prone Exercise  5 reps      Modalities   Modalities  Electrical Stimulation;Moist Heat      Moist Heat Therapy   Number Minutes Moist Heat  15 Minutes    Moist Heat Location  Cervical luke warm secondary to hydrocoloator malfunction      Electrical Stimulation   Electrical Stimulation Location  R UT/lats/T-L paraspinals    Electrical Stimulation  Action  80-150 Hz IFC x 15 min    Electrical Stimulation Goals  Tone;Pain      Manual Therapy   Manual Therapy  Soft tissue mobilization;Myofascial release    Soft tissue mobilization  to R lumbar/thoracic paraspinals, UT, Lev scap and lats    Myofascial Release  TPR to R thoracic paraspinals       Trigger Point Dry Needling - 01/15/17 1444    Consent Given?  Yes    Muscles Treated Upper Body  Upper trapezius;Levator scapulae;Quadratus Lumborum R and lats    Upper Trapezius Response  Twitch reponse elicited;Palpable increased muscle length R    Levator Scapulae Response  Twitch response elicited;Palpable increased muscle length R    Longissimus Response  Twitch response elicited;Palpable increased muscle length C3/4 R mulitfidi                PT Long Term Goals - 01/15/17 1428      PT LONG TERM GOAL #1   Title  independent with HEP    Time  6    Period  Weeks    Status  Achieved      PT LONG TERM GOAL #2   Title  Patient to report decreased frequency of migraines by 50% or  more    Baseline  no "bad" migraines for at least 2 weeks; awakes with dull HA everyday as of 01/15/17    Time  6    Period  Weeks    Status  On-going      PT LONG TERM GOAL #3   Title  Patient to demo functional cervical ROM to allow her to check blind spots while driving.    Time  6    Status  Achieved      PT LONG TERM GOAL #4   Title  Patient to demo improved cervical strength to 4+/5 to aide with correct posture.    Time  6    Period  Weeks    Status  Achieved      PT LONG TERM GOAL #5   Title  Patient to report decreased neck pain with ADLs to 2/10 or less.    Baseline  Pain <2/10 x 3-4 days and then starts to increase intermittently.    Time  6    Period  Weeks    Status  On-going            Plan - 01/15/17 1458    Clinical Impression Statement  Patient reports 70% improvement overall. She does very well after DN for 3-4 days and then pain begins to return. Today she denied neck pain, but reported pain in low back and shoulder. She responded well to DN to R QL, lats and UT today with ++twitch responses. Posture was normalized by end of treatment and pain abolished. She continues to experience daily HA, however migraines are significantly reduced. She is still tender to palpation over Bil scalenes and will benefit from DN here. HEP was reviewed and modified to ensure pelvic press was used.    Rehab Potential  Good    PT Frequency  3x / week    PT Duration  6 weeks    PT Treatment/Interventions  Electrical Stimulation;Ultrasound;Moist Heat;Therapeutic activities;Therapeutic exercise;Neuromuscular re-education;Patient/family education;Manual techniques;Dry needling;ADLs/Self Care Home Management    PT Next Visit Plan  DN to scalenes, SCM possibly,     PT Home Exercise Plan  QL stretch, neck diagonals; decompression OP exercises; pain free ROM, cervical tension stretches, cerv/scap  retraction, supine chest stretch. Axial extension in prone.       Patient will benefit from  skilled therapeutic intervention in order to improve the following deficits and impairments:  Pain, Decreased strength, Decreased range of motion, Impaired flexibility  Visit Diagnosis: Cervicalgia  Abnormal posture  Muscle weakness (generalized)     Problem List Patient Active Problem List   Diagnosis Date Noted  . Constipation due to outlet dysfunction   . Rectal pain   . Osteopenia with high risk of fracture 02/05/2015  . BMI 26.0-26.9,adult 05/21/2014  . Heel spur 05/21/2014  . Migraines 12/20/2012  . Insomnia 12/20/2012  . Depression 12/20/2012  . GERD (gastroesophageal reflux disease) 12/20/2012  . Hyperlipidemia with target LDL less than 100 12/20/2012    [name] PT 01/15/2017, 3:09 PM  [facility] [address] [place], [name], [address] Phone: [phone]   Fax:  [phone]  Name: MARYFER TAUZIN MRN: [id number] Date of Birth: 24-Jun-1950

## 2017-01-18 ENCOUNTER — Encounter: Payer: Self-pay | Admitting: Physical Therapy

## 2017-01-18 ENCOUNTER — Ambulatory Visit: Payer: Medicare Other | Admitting: Physical Therapy

## 2017-01-18 DIAGNOSIS — R293 Abnormal posture: Secondary | ICD-10-CM | POA: Diagnosis not present

## 2017-01-18 DIAGNOSIS — M25551 Pain in right hip: Secondary | ICD-10-CM

## 2017-01-18 DIAGNOSIS — M542 Cervicalgia: Secondary | ICD-10-CM | POA: Diagnosis not present

## 2017-01-18 DIAGNOSIS — R278 Other lack of coordination: Secondary | ICD-10-CM | POA: Diagnosis not present

## 2017-01-18 DIAGNOSIS — R29898 Other symptoms and signs involving the musculoskeletal system: Secondary | ICD-10-CM

## 2017-01-18 DIAGNOSIS — M6281 Muscle weakness (generalized): Secondary | ICD-10-CM

## 2017-01-18 NOTE — Patient Instructions (Signed)
Pt given handout for SNAGS, necks/ self SNAGS for neck  Chin tuck with towel self PA resistance 5 x 10 sec hold Chin tuck with rotation self SNAG 5 x right and then left for 10 sec each.  Supine chin tuck with towel at base of neck  10 sec hold x 5  Voncille Lo, PT Certified Exercise Expert for the Aging Adult  01/18/17 11:50 AM Phone: [phone] Fax: [phone]

## 2017-01-18 NOTE — Therapy (Signed)
[place], [name], [address] Phone: [phone]   Fax:  [phone]  Physical Therapy Treatment  Patient Details  Name: Selena Barnes MRN: [id number] Date of Birth: Aug 28, 1950 Referring Provider: Dr. Vonna Kotyk Dettinger   Encounter Date: 01/18/2017  PT End of Session - 01/18/17 1115    Visit Number  16    Number of Visits  20    Date for PT Re-Evaluation  02/05/17    Authorization Type  g-code 20th visit    PT Start Time  1101    PT Stop Time  1156    PT Time Calculation (min)  55 min    Activity Tolerance  Patient tolerated treatment well    Behavior During Therapy  [facility] for tasks assessed/performed       Past Medical History:  Diagnosis Date  . Arthritis   . Constipation   . Depression   . GERD (gastroesophageal reflux disease)   . Hyperlipidemia   . Insomnia   . Migraines   . Osteopenia     Past Surgical History:  Procedure Laterality Date  . ABDOMINAL HYSTERECTOMY    . ANAL RECTAL MANOMETRY N/A 10/06/2015   Procedure: ANO RECTAL MANOMETRY;  Surgeon: Gatha Mayer, MD;  Location: [facility];  Service: Endoscopy;  Laterality: N/A;  . BREAST BIOPSY Right   . BREAST EXCISIONAL BIOPSY    . CATARACT EXTRACTION W/ INTRAOCULAR LENS IMPLANT Bilateral 07/27/2016   and 09/27/2016, [employer] in [place]  . CATARACT EXTRACTION, BILATERAL    . CHOLECYSTECTOMY    . LYMPH NODE BIOPSY Left    left arm    There were no vitals filed for this visit.  Subjective Assessment - 01/18/17 1106    Subjective  I have constant headaches and the weather is making everything worse,  Cold weather tears my body up.  Almy[facility] arranged for me to see you for dry needling for scalenes.  I have done well with dry needling.     Patient Stated Goals  to get rid of pain and get back to normal activities    Currently in Pain?  Yes    Pain Score  6     Pain Location  Shoulder    Pain Orientation  Right     Pain Descriptors / Indicators  Aching    Pain Type  Chronic pain    Pain Radiating Towards  to right shoudler    Multiple Pain Sites  Yes    Pain Score  9    Pain Location  Head    Pain Orientation  Right;Left    Pain Descriptors / Indicators  Throbbing    Pain Type  Chronic pain    Pain Onset  More than a month ago    Pain Frequency  Constant                      [facility] Adult PT Treatment/Exercise - 01/18/17 1109      Neck Exercises: Seated   Other Seated Exercise  SNAGS with towel for neck chin tuck x 5 10 sec hold and SNAGS for neck rotation using towel 10 sec hold bil x 5 each      Moist Heat Therapy   Number Minutes Moist Heat  15 Minutes    Moist Heat Location  Cervical      Manual Therapy   Manual Therapy  Soft tissue mobilization;Myofascial release    Joint Mobilization  lateral UPA at C-3 to C-5 bil,  sub occipital release,    Soft tissue mobilization  scalenes, cervical paraspinals also with IASTYM tool.      Myofascial Release  scalenes and upper traps bil    Manual Traction  cervical distraction in sitting  x 2 for 15 sec each.      Neck Exercises: Stretches   Upper Trapezius Stretch  2 reps;30 seconds bil    Levator Stretch  2 reps;30 seconds bil    Other Neck Stretches  scalene stretch bil 30 sec x 2 each       Trigger Point Dry Needling - 01/18/17 1110    Consent Given?  Yes    Muscles Treated Upper Body  Levator scapulae;Upper trapezius;Oblique capitus;Suboccipitals muscle group scalenes bil           PT Education - 01/18/17 1105    Education provided  Yes    Education Details  Included SNAG's to neck with towel. sitting to HEP, reveiwed precautions of TPDN    Person(s) Educated  Patient    Methods  Explanation;Demonstration;Tactile cues;Handout    Comprehension  Verbalized understanding;Returned demonstration          PT Long Term Goals - 01/15/17 1428      PT LONG TERM GOAL #1   Title  independent with HEP    Time  6     Period  Weeks    Status  Achieved      PT LONG TERM GOAL #2   Title  Patient to report decreased frequency of migraines by 50% or more    Baseline  no "bad" migraines for at least 2 weeks; awakes with dull HA everyday as of 01/15/17    Time  6    Period  Weeks    Status  On-going      PT LONG TERM GOAL #3   Title  Patient to demo functional cervical ROM to allow her to check blind spots while driving.    Time  6    Status  Achieved      PT LONG TERM GOAL #4   Title  Patient to demo improved cervical strength to 4+/5 to aide with correct posture.    Time  6    Period  Weeks    Status  Achieved      PT LONG TERM GOAL #5   Title  Patient to report decreased neck pain with ADLs to 2/10 or less.    Baseline  Pain <2/10 x 3-4 days and then starts to increase intermittently.    Time  6    Period  Weeks    Status  On-going            Plan - 01/18/17 1150    Clinical Impression Statement  Pt requests TPDN for neck/ headache pain.  Especially scalenes bil.  Pt was closely monitiored throughout session and responded well with several Localized twitch responses especially left upper trap and bil scalenes.  Pt reported 3/10 headaches at end of session.. will conitnue to monitor T       Patient will benefit from skilled therapeutic intervention in order to improve the following deficits and impairments:     Visit Diagnosis: Cervicalgia  Abnormal posture  Muscle weakness (generalized)  Other lack of coordination  Right hip pain  Weakness of right hip     Problem List Patient Active Problem List   Diagnosis Date Noted  . Constipation due to outlet dysfunction   .  Rectal pain   . Osteopenia with high risk of fracture 02/05/2015  . BMI 26.0-26.9,adult 05/21/2014  . Heel spur 05/21/2014  . Migraines 12/20/2012  . Insomnia 12/20/2012  . Depression 12/20/2012  . GERD (gastroesophageal reflux disease) 12/20/2012  . Hyperlipidemia with target LDL less than 100  12/20/2012    Voncille Lo, PT Certified Exercise Expert for the Aging Adult  01/18/17 12:54 PM Phone: [phone] Fax: [place] [facility] [address] [place], [name], [address] Phone: [phone]   Fax:  [phone]  Name: Selena Barnes MRN: [id number] Date of Birth: 05/08/1950

## 2017-01-22 ENCOUNTER — Encounter: Payer: Medicare Other | Admitting: Physical Therapy

## 2017-01-23 ENCOUNTER — Encounter: Payer: Self-pay | Admitting: Physical Therapy

## 2017-01-23 ENCOUNTER — Ambulatory Visit: Payer: Medicare Other | Admitting: Physical Therapy

## 2017-01-23 DIAGNOSIS — R29898 Other symptoms and signs involving the musculoskeletal system: Secondary | ICD-10-CM | POA: Diagnosis not present

## 2017-01-23 DIAGNOSIS — M6281 Muscle weakness (generalized): Secondary | ICD-10-CM | POA: Diagnosis not present

## 2017-01-23 DIAGNOSIS — M25551 Pain in right hip: Secondary | ICD-10-CM

## 2017-01-23 DIAGNOSIS — R293 Abnormal posture: Secondary | ICD-10-CM | POA: Diagnosis not present

## 2017-01-23 DIAGNOSIS — M542 Cervicalgia: Secondary | ICD-10-CM

## 2017-01-23 DIAGNOSIS — R278 Other lack of coordination: Secondary | ICD-10-CM | POA: Diagnosis not present

## 2017-01-23 NOTE — Therapy (Addendum)
[place], [name], [address] Phone: [phone]   Fax:  [phone]  Physical Therapy Treatment/Discharge Note  Patient Details  Name: Selena Barnes MRN: [id number] Date of Birth: 06/12/1950 Referring Provider: Dr. Vonna Kotyk Dettinger   Encounter Date: 01/23/2017  PT End of Session - 01/23/17 1107    Visit Number  17    Number of Visits  20    Date for PT Re-Evaluation  02/05/17    Authorization Type  g-code 20th visit    PT Start Time  1108    PT Stop Time  1202    PT Time Calculation (min)  54 min    Activity Tolerance  Patient tolerated treatment well    Behavior During Therapy  [facility] for tasks assessed/performed       Past Medical History:  Diagnosis Date  . Arthritis   . Constipation   . Depression   . GERD (gastroesophageal reflux disease)   . Hyperlipidemia   . Insomnia   . Migraines   . Osteopenia     Past Surgical History:  Procedure Laterality Date  . ABDOMINAL HYSTERECTOMY    . ANAL RECTAL MANOMETRY N/A 10/06/2015   Procedure: ANO RECTAL MANOMETRY;  Surgeon: Gatha Mayer, MD;  Location: [facility];  Service: Endoscopy;  Laterality: N/A;  . BREAST BIOPSY Right   . BREAST EXCISIONAL BIOPSY    . CATARACT EXTRACTION W/ INTRAOCULAR LENS IMPLANT Bilateral 07/27/2016   and 09/27/2016, [employer] in [place]  . CATARACT EXTRACTION, BILATERAL    . CHOLECYSTECTOMY    . LYMPH NODE BIOPSY Left    left arm    There were no vitals filed for this visit.  Subjective Assessment - 01/23/17 1110    Subjective  I feel like a 4/10 today.  I made through the snow and stayed in the house.  I feel like I can move my neck better since I have been getting TPDN    Patient Stated Goals  to get rid of pain and get back to normal activities    Currently in Pain?  Yes    Pain Score  4     Pain Location  Shoulder    Pain Orientation  Right    Pain Descriptors / Indicators  Aching    Pain  Type  Chronic pain    Pain Onset  More than a month ago    Pain Frequency  Intermittent    Pain Score  2    Pain Location  Head    Pain Orientation  Right;Left    Pain Descriptors / Indicators  Throbbing    Pain Type  Chronic pain    Pain Onset  More than a month ago    Pain Frequency  Constant         [facility] PT Assessment - 01/23/17 1140      AROM   Cervical Flexion  65    Cervical Extension  60    Cervical - Right Side Bend  44    Cervical - Left Side Bend  47    Cervical - Right Rotation  57    Cervical - Left Rotation  65                  [facility] Adult PT Treatment/Exercise - 01/23/17 1114      Neck Exercises: Seated   Other Seated Exercise  SNAGS with towel for neck chin tuck x 5 10 sec hold  and SNAGS for neck rotation using towel 10 sec hold bil x 5 each reviewd for home use verbally      Neck Exercises: Supine   Other Supine Exercise  deep neck flexor strength with chin tuck raised 1 inch for 20 sec x 3      Moist Heat Therapy   Number Minutes Moist Heat  12 Minutes    Moist Heat Location  Cervical      Manual Therapy   Manual Therapy  Soft tissue mobilization;Myofascial release    Joint Mobilization  lateral UPA at C-3 to C-5 bil,  sub occipital release,    Soft tissue mobilization  scalenes, cervical paraspinals also with IASTYM tool.      Myofascial Release  scalenes and upper traps bil    Manual Traction  cervical distraction in sitting  x 2 for 15 sec each.      Neck Exercises: Stretches   Upper Trapezius Stretch  2 reps;30 seconds bil    Levator Stretch  2 reps;30 seconds bil    Other Neck Stretches  scalene stretch bil 30 sec x 2 each       Trigger Point Dry Needling - 01/23/17 1116    Consent Given?  Yes    Muscles Treated Upper Body  Suboccipitals muscle group;Oblique capitus;Upper trapezius;Levator scapulae;Sternocleidomastoid right side only    Sternocleidomastoid Response  Palpable increased muscle length    Upper Trapezius Response   Twitch reponse elicited;Palpable increased muscle length    Oblique Capitus Response  Twitch response elicited;Palpable increased muscle length    SubOccipitals Response  Twitch response elicited;Palpable increased muscle length    Levator Scapulae Response  Twitch response elicited;Palpable increased muscle length    Longissimus Response  Twitch response elicited;Palpable increased muscle length erector spinae C-2-C-5 right                PT Long Term Goals - 01/23/17 1149      PT LONG TERM GOAL #1   Title  independent with HEP    Time  6    Period  Weeks    Status  Achieved      PT LONG TERM GOAL #2   Title  Patient to report decreased frequency of migraines by 50% or more    Baseline  Pt states she is 50% better but wakes up with a migraine      Time  6    Period  Weeks    Status  Partially Met      PT LONG TERM GOAL #3   Title  Patient to demo functional cervical ROM to allow her to check blind spots while driving.    Baseline  AROM of cervical improved in almost all ranges  See MMT flowsheet    Time  6    Period  Weeks    Status  Achieved      PT LONG TERM GOAL #4   Title  Patient to demo improved cervical strength to 4+/5 to aide with correct posture.    Baseline  all 5/5 except flexion 4+/5 continuing today    Time  6    Period  Weeks    Status  Achieved      PT LONG TERM GOAL #5   Title  Patient to report decreased neck pain with ADLs to 2/10 or less.    Baseline  4/10 today right shoulder   head 2/10     Time  6    Period  Weeks    Status  On-going            Plan - 01/23/17 1151    Clinical Impression Statement  Pt request TPDN for neck/ headache pain.  Pt was closely monitored throughout session and pt right scalenes with palpable increased tissue extensibiility,  Pt AROM of cervical improved see Flowsheet.  Pt reports 4/10 shoulder pain and 2/10 head ache pain.  Pt  has made improvements with scalene tissue extensibility .  Pt will return to  [facility] clinic for rest of session to maximize funstion and strength.  Pt has diffculty maintaining neck flexor strength in supine 1 inch from supine.  Will benefit from continue strengthening.  / isometrics and motin againnst gravity.      Rehab Potential  Good    Clinical Impairments Affecting Rehab Potential  None    PT Frequency  3x / week    PT Duration  6 weeks    PT Treatment/Interventions  Electrical Stimulation;Ultrasound;Moist Heat;Therapeutic activities;Therapeutic exercise;Neuromuscular re-education;Patient/family education;Manual techniques;Dry needling;ADLs/Self Care Home Management    PT Next Visit Plan  Assess goals and continue strengthening of deep neck flexors and TPDN as needed. Scalenes with greater tissue extensibility    PT Home Exercise Plan  QL stretch, neck diagonals; decompression OP exercises; pain free ROM, cervical tension stretches, cerv/scap retraction, supine chest stretch. Axial extension in prone deep neck flexor strength.    Consulted and Agree with Plan of Care  Patient       Patient will benefit from skilled therapeutic intervention in order to improve the following deficits and impairments:  Pain, Decreased strength, Decreased range of motion, Impaired flexibility  Visit Diagnosis: Cervicalgia  Abnormal posture  Muscle weakness (generalized)  Other lack of coordination  Right hip pain  Weakness of right hip     Problem List Patient Active Problem List   Diagnosis Date Noted  . Constipation due to outlet dysfunction   . Rectal pain   . Osteopenia with high risk of fracture 02/05/2015  . BMI 26.0-26.9,adult 05/21/2014  . Heel spur 05/21/2014  . Migraines 12/20/2012  . Insomnia 12/20/2012  . Depression 12/20/2012  . GERD (gastroesophageal reflux disease) 12/20/2012  . Hyperlipidemia with target LDL less than 100 12/20/2012    Voncille Lo, PT Certified Exercise Expert for the Aging Adult  01/23/17 11:56 AM Phone:  [phone] Fax: [facility] [address] [place], [name], [address] Phone: [phone]   Fax:  [phone]  Name: Selena Barnes MRN: [id number] Date of Birth: February 23, 1950   PHYSICAL THERAPY DISCHARGE SUMMARY  Visits from Start of Care: 17  Current functional level related to goals / functional outcomes: unknown   Remaining deficits: unknown   Education / Equipment: HEP Plan:                                                    Patient goals were partially met. Patient is being discharged due to not returning since the last visit.  ?????    Voncille Lo, PT Certified Exercise Expert for the Aging Adult  03/27/17 11:14 AM Phone: [phone] Fax: [phone]

## 2017-01-30 ENCOUNTER — Encounter: Payer: Medicare Other | Admitting: Physical Therapy

## 2017-02-09 ENCOUNTER — Encounter: Payer: Self-pay | Admitting: Physician Assistant

## 2017-02-09 ENCOUNTER — Ambulatory Visit (INDEPENDENT_AMBULATORY_CARE_PROVIDER_SITE_OTHER): Payer: Medicare Other | Admitting: Physician Assistant

## 2017-02-09 VITALS — BP 120/76 | HR 83 | Temp 99.5°F | Ht 63.0 in | Wt 141.6 lb

## 2017-02-09 DIAGNOSIS — J039 Acute tonsillitis, unspecified: Secondary | ICD-10-CM

## 2017-02-09 MED ORDER — SCOPOLAMINE 1 MG/3DAYS TD PT72: 1.0000 | MEDICATED_PATCH | TRANSDERMAL | 12 refills | Status: DC

## 2017-02-09 MED ORDER — AMOXICILLIN-POT CLAVULANATE 875-125 MG PO TABS: 1.0000 | ORAL_TABLET | Freq: Two times a day (BID) | ORAL | 0 refills | Status: DC

## 2017-02-09 NOTE — Patient Instructions (Signed)
Tonsillitis Tonsillitis is an infection of the throat that causes the tonsils to become red, tender, and swollen. Tonsils are collections of lymphoid tissue at the back of the throat. Each tonsil has crevices (crypts). Tonsils help fight nose and throat infections and keep infection from spreading to other parts of the body for the first 18 months of life. What are the causes? Sudden (acute) tonsillitis is usually caused by infection with streptococcal bacteria. Long-lasting (chronic) tonsillitis occurs when the crypts of the tonsils become filled with pieces of food and bacteria, which makes it easy for the tonsils to become repeatedly infected. What are the signs or symptoms? Symptoms of tonsillitis include:  A sore throat, with possible difficulty swallowing.  White patches on the tonsils.  Fever.  Tiredness.  New episodes of snoring during sleep, when you did not snore before.  Small, foul-smelling, yellowish-white pieces of material (tonsilloliths) that you occasionally cough up or spit out. The tonsilloliths can also cause you to have bad breath.  How is this diagnosed? Tonsillitis can be diagnosed through a physical exam. Diagnosis can be confirmed with the results of lab tests, including a throat culture. How is this treated? The goals of tonsillitis treatment include the reduction of the severity and duration of symptoms and prevention of associated conditions. Symptoms of tonsillitis can be improved with the use of steroids to reduce the swelling. Tonsillitis caused by bacteria can be treated with antibiotic medicines. Usually, treatment with antibiotic medicines is started before the cause of the tonsillitis is known. However, if it is determined that the cause is not bacterial, antibiotic medicines will not treat the tonsillitis. If attacks of tonsillitis are severe and frequent, your health care provider may recommend surgery to remove the tonsils (tonsillectomy). Follow these  instructions at home:  Rest as much as possible and get plenty of sleep.  Drink plenty of fluids. While the throat is very sore, eat soft foods or liquids, such as sherbet, soups, or instant breakfast drinks.  Eat frozen ice pops.  Gargle with a warm or cold liquid to help soothe the throat. Mix 1/4 teaspoon of salt and 1/4 teaspoon of baking soda in 8 oz of water. Contact a health care provider if:  Large, tender lumps develop in your neck.  A rash develops.  A green, yellow-brown, or bloody substance is coughed up.  You are unable to swallow liquids or food for 24 hours.  You notice that only one of the tonsils is swollen. Get help right away if:  You develop any new symptoms such as vomiting, severe headache, stiff neck, chest pain, or trouble breathing or swallowing.  You have severe throat pain along with drooling or voice changes.  You have severe pain, unrelieved with recommended medications.  You are unable to fully open the mouth.  You develop redness, swelling, or severe pain anywhere in the neck.  You have a fever. This information is not intended to replace advice given to you by your health care provider. Make sure you discuss any questions you have with your health care provider. Document Released: 11/09/2004 Document Revised: 07/08/2015 Document Reviewed: 07/19/2012 [employer] Interactive Patient Education  2017 [employer]  

## 2017-02-09 NOTE — Progress Notes (Signed)
BP 120/76   Pulse 83   Temp 99.5 F (37.5 C) (Oral)   Ht 5\' 3"  (1.6 m)   Wt 141 lb 9.6 oz (64.2 kg)   BMI 25.08 kg/m    Subjective:    Patient ID: Selena Barnes, female    DOB: 1950-10-29, 66 y.o.   MRN: [id number]  HPI: [name] is a 66 y.o. female presenting on 02/09/2017 for Sore Throat (2-3 months. Patient states tonsil on right side is split )  Patient comes right tonsillar area for 3 months.  She denies any tobacco use.  She has a very distant history of smoking.  She does not know of any lesions that her dentist has been watching.  She states in the last few days she has began to feel worse having some vague body aches and elevated temp.  She normally runs in the 96 range.  She was 99.5 today.  Relevant past medical, surgical, family and social history reviewed and updated as indicated. Allergies and medications reviewed and updated.  Past Medical History:  Diagnosis Date  . Arthritis   . Constipation   . Depression   . GERD (gastroesophageal reflux disease)   . Hyperlipidemia   . Insomnia   . Migraines   . Osteopenia     Past Surgical History:  Procedure Laterality Date  . ABDOMINAL HYSTERECTOMY    . ANAL RECTAL MANOMETRY N/A 10/06/2015   Procedure: ANO RECTAL MANOMETRY;  Surgeon: Gatha Mayer, MD;  Location: [facility];  Service: Endoscopy;  Laterality: N/A;  . BREAST BIOPSY Right   . BREAST EXCISIONAL BIOPSY    . CATARACT EXTRACTION W/ INTRAOCULAR LENS IMPLANT Bilateral 07/27/2016   and 09/27/2016, [employer] in [place]  . CATARACT EXTRACTION, BILATERAL    . CHOLECYSTECTOMY    . LYMPH NODE BIOPSY Left    left arm    Review of Systems  Constitutional: Positive for chills, fatigue and fever. Negative for activity change and appetite change.  HENT: Positive for congestion and sore throat. Negative for postnasal drip, trouble swallowing and voice change.   Eyes: Negative.   Respiratory: Negative for cough and wheezing.     Cardiovascular: Negative.  Negative for chest pain, palpitations and leg swelling.  Gastrointestinal: Negative.   Genitourinary: Negative.   Musculoskeletal: Positive for myalgias.  Skin: Negative.   Neurological: Positive for headaches.    Allergies as of 02/09/2017   No Known Allergies     Medication List        Accurate as of 02/09/17  9:09 AM. Always use your most recent med list.          amoxicillin-clavulanate 875-125 MG tablet Commonly known as:  AUGMENTIN Take 1 tablet by mouth 2 (two) times daily.   aspirin 81 MG tablet Take 81 mg by mouth daily.   CITRACAL SLOW RELEASE PO Take 1,200 mg by mouth 2 (two) times daily before a meal.   DULoxetine 60 MG capsule Commonly known as:  CYMBALTA Take 1 capsule (60 mg total) by mouth 2 (two) times daily.   esomeprazole 40 MG capsule Commonly known as:  NEXIUM Take 1 capsule (40 mg total) by mouth daily at 12 noon.   estradiol 0.1 MG/GM vaginal cream Commonly known as:  ESTRACE VAGINAL Place 1 Applicatorful vaginally at bedtime.   fish oil-omega-3 fatty acids 1000 MG capsule Take 2 g by mouth 2 (two) times daily.   metaxalone 800 MG tablet Commonly known as:  SKELAXIN Take  1 tablet (800 mg total) by mouth 3 (three) times daily as needed.   promethazine 25 MG tablet Commonly known as:  PHENERGAN Take 1 tablet (25 mg total) by mouth every 6 (six) hours as needed for nausea or vomiting.   raloxifene 60 MG tablet Commonly known as:  EVISTA Take 1 tablet (60 mg total) by mouth daily.   rizatriptan 10 MG disintegrating tablet Commonly known as:  MAXALT-MLT Take 1 tablet (10 mg total) by mouth as needed for migraine. May repeat in 2 hours if needed   rosuvastatin 10 MG tablet Commonly known as:  CRESTOR Take 1 tablet (10 mg total) by mouth daily.   scopolamine 1 MG/3DAYS Commonly known as:  TRANSDERM-SCOP (1.5 MG) Place 1 patch (1.5 mg total) onto the skin every 3 (three) days.   topiramate 100 MG  tablet Commonly known as:  TOPAMAX Take 1 tablet (100 mg total) by mouth daily.   Vitamin D3 2000 units capsule Take 2,000 Units by mouth 2 (two) times daily.   zolpidem 10 MG tablet Commonly known as:  AMBIEN Take 0.5 tablets (5 mg total) by mouth at bedtime as needed for sleep.          Objective:    BP 120/76   Pulse 83   Temp 99.5 F (37.5 C) (Oral)   Ht 5\' 3"  (1.6 m)   Wt 141 lb 9.6 oz (64.2 kg)   BMI 25.08 kg/m   No Known Allergies  Physical Exam  Constitutional: She is oriented to person, place, and time. She appears well-developed and well-nourished.  HENT:  Head: Normocephalic and atraumatic.  Right Ear: Tympanic membrane and ear canal normal. No middle ear effusion.  Left Ear: Tympanic membrane and ear canal normal.  Nose: Mucosal edema present. Right sinus exhibits no frontal sinus tenderness. Left sinus exhibits no frontal sinus tenderness.  Mouth/Throat: No oral lesions. Normal dentition. No dental caries. Posterior oropharyngeal edema and posterior oropharyngeal erythema present. No oropharyngeal exudate or tonsillar abscesses.    Eyes: Conjunctivae and EOM are normal. Pupils are equal, round, and reactive to light.  Neck: Normal range of motion.  Cardiovascular: Normal rate, regular rhythm, normal heart sounds and intact distal pulses.  Pulmonary/Chest: Effort normal and breath sounds normal.  Abdominal: Soft. Bowel sounds are normal.  Neurological: She is alert and oriented to person, place, and time. She has normal reflexes.  Skin: Skin is warm and dry. No rash noted.  Psychiatric: She has a normal mood and affect. Her behavior is normal. Judgment and thought content normal.  Nursing note and vitals reviewed.       Assessment & Plan:   1. Tonsillitis  - amoxicillin-clavulanate (AUGMENTIN) 875-125 MG tablet; Take 1 tablet by mouth 2 (two) times daily.  Dispense: 20 tablet; Refill: 0 - Ambulatory referral to ENT    Current Outpatient  Medications:  .  amoxicillin-clavulanate (AUGMENTIN) 875-125 MG tablet, Take 1 tablet by mouth 2 (two) times daily., Disp: 20 tablet, Rfl: 0 .  aspirin 81 MG tablet, Take 81 mg by mouth daily., Disp: , Rfl:  .  Calcium-Magnesium-Vitamin D (CITRACAL SLOW RELEASE PO), Take 1,200 mg by mouth 2 (two) times daily before a meal., Disp: , Rfl:  .  Cholecalciferol (VITAMIN D3) 2000 UNITS capsule, Take 2,000 Units by mouth 2 (two) times daily., Disp: , Rfl:  .  DULoxetine (CYMBALTA) 60 MG capsule, Take 1 capsule (60 mg total) by mouth 2 (two) times daily., Disp: 90 capsule, Rfl: 3 .  esomeprazole (NEXIUM) 40 MG capsule, Take 1 capsule (40 mg total) by mouth daily at 12 noon., Disp: 90 capsule, Rfl: 0 .  estradiol (ESTRACE VAGINAL) 0.1 MG/GM vaginal cream, Place 1 Applicatorful vaginally at bedtime., Disp: 127.5 g, Rfl: 3 .  fish oil-omega-3 fatty acids 1000 MG capsule, Take 2 g by mouth 2 (two) times daily. , Disp: , Rfl:  .  metaxalone (SKELAXIN) 800 MG tablet, Take 1 tablet (800 mg total) by mouth 3 (three) times daily as needed., Disp: 90 tablet, Rfl: 3 .  promethazine (PHENERGAN) 25 MG tablet, Take 1 tablet (25 mg total) by mouth every 6 (six) hours as needed for nausea or vomiting., Disp: 30 tablet, Rfl: 3 .  raloxifene (EVISTA) 60 MG tablet, Take 1 tablet (60 mg total) by mouth daily., Disp: 90 tablet, Rfl: 3 .  rizatriptan (MAXALT-MLT) 10 MG disintegrating tablet, Take 1 tablet (10 mg total) by mouth as needed for migraine. May repeat in 2 hours if needed, Disp: 18 tablet, Rfl: 3 .  rosuvastatin (CRESTOR) 10 MG tablet, Take 1 tablet (10 mg total) by mouth daily., Disp: 90 tablet, Rfl: 0 .  scopolamine (TRANSDERM-SCOP, 1.5 MG,) 1 MG/3DAYS, Place 1 patch (1.5 mg total) onto the skin every 3 (three) days., Disp: 10 patch, Rfl: 12 .  topiramate (TOPAMAX) 100 MG tablet, Take 1 tablet (100 mg total) by mouth daily., Disp: 90 tablet, Rfl: 3 .  zolpidem (AMBIEN) 10 MG tablet, Take 0.5 tablets (5 mg total) by  mouth at bedtime as needed for sleep., Disp: 30 tablet, Rfl: 2 Continue all other maintenance medications as listed above.  Follow up plan: Return if symptoms worsen or fail to improve.  Educational handout given for tonsillitis  Terald Sleeper PA-C [facility] [address]  [place], [place] [address] [phone]   02/09/2017, 9:09 AM

## 2017-02-13 HISTORY — PX: OTHER SURGICAL HISTORY: SHX169

## 2017-02-15 DIAGNOSIS — K140 Glossitis: Secondary | ICD-10-CM | POA: Insufficient documentation

## 2017-02-15 DIAGNOSIS — R070 Pain in throat: Secondary | ICD-10-CM | POA: Diagnosis not present

## 2017-03-01 DIAGNOSIS — K140 Glossitis: Secondary | ICD-10-CM | POA: Diagnosis not present

## 2017-03-01 DIAGNOSIS — R070 Pain in throat: Secondary | ICD-10-CM | POA: Diagnosis not present

## 2017-03-23 DIAGNOSIS — K140 Glossitis: Secondary | ICD-10-CM | POA: Diagnosis not present

## 2017-03-23 DIAGNOSIS — R070 Pain in throat: Secondary | ICD-10-CM | POA: Diagnosis not present

## 2017-04-17 ENCOUNTER — Other Ambulatory Visit: Payer: Self-pay | Admitting: *Deleted

## 2017-04-17 ENCOUNTER — Other Ambulatory Visit: Payer: Self-pay

## 2017-04-17 MED ORDER — METAXALONE 800 MG PO TABS: 800.0000 mg | ORAL_TABLET | Freq: Three times a day (TID) | ORAL | 0 refills | Status: DC | PRN

## 2017-04-17 MED ORDER — ROSUVASTATIN CALCIUM 10 MG PO TABS: 10.0000 mg | ORAL_TABLET | Freq: Every day | ORAL | 0 refills | Status: DC

## 2017-04-17 MED ORDER — TOPIRAMATE 100 MG PO TABS: 100.0000 mg | ORAL_TABLET | Freq: Every day | ORAL | 0 refills | Status: DC

## 2017-04-17 MED ORDER — RIZATRIPTAN BENZOATE 10 MG PO TBDP: 10.0000 mg | ORAL_TABLET | ORAL | 1 refills | Status: DC | PRN

## 2017-04-17 MED ORDER — DULOXETINE HCL 60 MG PO CPEP: 60.0000 mg | ORAL_CAPSULE | Freq: Two times a day (BID) | ORAL | 0 refills | Status: DC

## 2017-04-17 MED ORDER — PROMETHAZINE HCL 25 MG PO TABS: 25.0000 mg | ORAL_TABLET | Freq: Four times a day (QID) | ORAL | 1 refills | Status: DC | PRN

## 2017-04-17 MED ORDER — ESTRADIOL 0.1 MG/GM VA CREA: 1.0000 | TOPICAL_CREAM | Freq: Every day | VAGINAL | 3 refills | Status: DC

## 2017-04-17 MED ORDER — ZOLPIDEM TARTRATE 10 MG PO TABS: 5.0000 mg | ORAL_TABLET | Freq: Every evening | ORAL | 0 refills | Status: DC | PRN

## 2017-04-17 MED ORDER — ESOMEPRAZOLE MAGNESIUM 40 MG PO CPDR: 40.0000 mg | DELAYED_RELEASE_CAPSULE | Freq: Every day | ORAL | 1 refills | Status: DC

## 2017-04-17 NOTE — Telephone Encounter (Signed)
Patient needs refill of Skelaxin, Phenergan, Maxalt, Topamax, and Ambien.  Would like these sent to [facility].  Please advise.

## 2017-05-03 ENCOUNTER — Encounter: Payer: Self-pay | Admitting: Family Medicine

## 2017-05-03 ENCOUNTER — Ambulatory Visit (INDEPENDENT_AMBULATORY_CARE_PROVIDER_SITE_OTHER): Payer: Medicare Other | Admitting: Family Medicine

## 2017-05-03 VITALS — BP 118/69 | HR 89 | Temp 98.4°F | Ht 63.0 in | Wt 153.0 lb

## 2017-05-03 DIAGNOSIS — M722 Plantar fascial fibromatosis: Secondary | ICD-10-CM

## 2017-05-03 MED ORDER — METHYLPREDNISOLONE ACETATE 80 MG/ML IJ SUSP
40.0000 mg | Freq: Once | INTRAMUSCULAR | Status: AC
  Administered 2017-05-03: 40 mg via INTRAMUSCULAR

## 2017-05-03 MED ORDER — PROMETHAZINE HCL 25 MG PO TABS: 25.0000 mg | ORAL_TABLET | Freq: Four times a day (QID) | ORAL | 1 refills | Status: DC | PRN

## 2017-05-03 MED ORDER — RIZATRIPTAN BENZOATE 10 MG PO TBDP: 10.0000 mg | ORAL_TABLET | ORAL | 1 refills | Status: DC | PRN

## 2017-05-03 MED ORDER — ZOLPIDEM TARTRATE 10 MG PO TABS: 5.0000 mg | ORAL_TABLET | Freq: Every evening | ORAL | 1 refills | Status: DC | PRN

## 2017-05-03 NOTE — Progress Notes (Signed)
BP 118/69   Pulse 89   Temp 98.4 F (36.9 C) (Oral)   Ht 5\' 3"  (1.6 m)   Wt 153 lb (69.4 kg)   BMI 27.10 kg/m    Subjective:    Patient ID: Selena Barnes, female    DOB: 30-Oct-1950, 67 y.o.   MRN: [id number]  HPI: Selena Barnes is a 67 y.o. female presenting on 05/03/2017 for Left heel pain, pain in left calf muscle (x 1 month) and Medication Refill (Ambien, Promethazine, Maxalt; we sent in a 30 day supply to mail order on 04/17/17, they will not fill only a 30 day supply, so she has still not received her medication)   HPI Left heel pain Patient is coming in today with complaints of left heel pain that has been bothering her over the past month and sometimes she will get shooting pain going up into her left calf.  She had this started up when she was over working out at the gym started feeling this pain.  She has had similar pain in her right foot for which she has been diagnosed with bone spurs and has had an injection in the past for that that has helped her.  She does have softer gel inserts to help but still having issues despite that.  Relevant past medical, surgical, family and social history reviewed and updated as indicated. Interim medical history since our last visit reviewed. Allergies and medications reviewed and updated.  Review of Systems  Constitutional: Negative for chills and fever.  Eyes: Negative for visual disturbance.  Respiratory: Negative for chest tightness and shortness of breath.   Cardiovascular: Negative for chest pain and leg swelling.  Musculoskeletal: Positive for arthralgias. Negative for back pain and gait problem.  Skin: Negative for rash.  Neurological: Negative for light-headedness and headaches.  Psychiatric/Behavioral: Negative for agitation and behavioral problems.  All other systems reviewed and are negative.   Per HPI unless specifically indicated above   Allergies as of 05/03/2017   No Known Allergies     Medication List        Accurate as of 05/03/17  1:26 PM. Always use your most recent med list.          aspirin 81 MG tablet Take 81 mg by mouth daily.   CITRACAL SLOW RELEASE PO Take 1,200 mg by mouth 2 (two) times daily before a meal.   DULoxetine 60 MG capsule Commonly known as:  CYMBALTA Take 1 capsule (60 mg total) by mouth 2 (two) times daily.   esomeprazole 40 MG capsule Commonly known as:  NEXIUM Take 1 capsule (40 mg total) by mouth daily at 12 noon.   estradiol 0.1 MG/GM vaginal cream Commonly known as:  ESTRACE VAGINAL Place 1 Applicatorful vaginally at bedtime.   fish oil-omega-3 fatty acids 1000 MG capsule Take 2 g by mouth 2 (two) times daily.   metaxalone 800 MG tablet Commonly known as:  SKELAXIN Take 1 tablet (800 mg total) by mouth 3 (three) times daily as needed.   promethazine 25 MG tablet Commonly known as:  PHENERGAN Take 1 tablet (25 mg total) by mouth every 6 (six) hours as needed for nausea or vomiting.   raloxifene 60 MG tablet Commonly known as:  EVISTA Take 1 tablet (60 mg total) by mouth daily.   rizatriptan 10 MG disintegrating tablet Commonly known as:  MAXALT-MLT Take 1 tablet (10 mg total) by mouth as needed for migraine. May repeat in 2 hours if needed  rosuvastatin 10 MG tablet Commonly known as:  CRESTOR Take 1 tablet (10 mg total) by mouth daily.   topiramate 100 MG tablet Commonly known as:  TOPAMAX Take 1 tablet (100 mg total) by mouth daily.   Vitamin D3 2000 units capsule Take 2,000 Units by mouth 2 (two) times daily.   zolpidem 10 MG tablet Commonly known as:  AMBIEN Take 0.5 tablets (5 mg total) by mouth at bedtime as needed for sleep.          Objective:    BP 118/69   Pulse 89   Temp 98.4 F (36.9 C) (Oral)   Ht 5\' 3"  (1.6 m)   Wt 153 lb (69.4 kg)   BMI 27.10 kg/m   Wt Readings from Last 3 Encounters:  05/03/17 153 lb (69.4 kg)  02/09/17 141 lb 9.6 oz (64.2 kg)  11/14/16 142 lb (64.4 kg)    Physical Exam    Constitutional: She is oriented to person, place, and time. She appears well-developed and well-nourished. No distress.  Eyes: Conjunctivae are normal.  Musculoskeletal: Normal range of motion.       Right foot: There is tenderness. There is normal range of motion and no bony tenderness.       Feet:  Neurological: She is alert and oriented to person, place, and time. Coordination normal.  Skin: Skin is warm and dry. No rash noted. She is not diaphoretic.  Psychiatric: She has a normal mood and affect. Her behavior is normal.  Nursing note and vitals reviewed.   Left heel injection: Consent form signed. Risk factors of bleeding and infection discussed with patient and patient is agreeable towards injection. Patient prepped with Betadine.  Central approach towards injection used. Injected 40 mg of Depo-Medrol and 1 mL of 2% lidocaine. Patient tolerated procedure well and no side effects from noted. Minimal to no bleeding. Simple bandage applied after.     Assessment & Plan:   Problem List Items Addressed This Visit    None    Visit Diagnoses    Plantar fasciitis    -  Primary   Relevant Medications   methylPREDNISolone acetate (DEPO-MEDROL) injection 40 mg       Follow up plan: Return if symptoms worsen or fail to improve.  Counseling provided for all of the vaccine components No orders of the defined types were placed in this encounter.   Caryl Pina, MD [facility] 05/03/2017, 1:26 PM

## 2017-06-07 ENCOUNTER — Other Ambulatory Visit: Payer: Self-pay | Admitting: Family Medicine

## 2017-06-07 DIAGNOSIS — Z1231 Encounter for screening mammogram for malignant neoplasm of breast: Secondary | ICD-10-CM

## 2017-06-27 ENCOUNTER — Ambulatory Visit
Admission: RE | Admit: 2017-06-27 | Discharge: 2017-06-27 | Disposition: A | Discharge status: Home / Self Care | Payer: Medicare Other | Source: Ambulatory Visit | Attending: Family Medicine | Admitting: Family Medicine

## 2017-06-27 DIAGNOSIS — Z1231 Encounter for screening mammogram for malignant neoplasm of breast: Secondary | ICD-10-CM

## 2017-07-04 DIAGNOSIS — H40033 Anatomical narrow angle, bilateral: Secondary | ICD-10-CM | POA: Diagnosis not present

## 2017-07-04 DIAGNOSIS — H04123 Dry eye syndrome of bilateral lacrimal glands: Secondary | ICD-10-CM | POA: Diagnosis not present

## 2017-08-28 DIAGNOSIS — H04123 Dry eye syndrome of bilateral lacrimal glands: Secondary | ICD-10-CM | POA: Diagnosis not present

## 2017-09-19 DIAGNOSIS — R040 Epistaxis: Secondary | ICD-10-CM | POA: Diagnosis not present

## 2017-09-19 DIAGNOSIS — R070 Pain in throat: Secondary | ICD-10-CM | POA: Diagnosis not present

## 2017-09-19 DIAGNOSIS — K140 Glossitis: Secondary | ICD-10-CM | POA: Diagnosis not present

## 2017-09-21 ENCOUNTER — Telehealth: Payer: Self-pay | Admitting: Family Medicine

## 2017-09-21 MED ORDER — ROSUVASTATIN CALCIUM 10 MG PO TABS: 10.0000 mg | ORAL_TABLET | Freq: Every day | ORAL | 0 refills | Status: DC

## 2017-09-21 MED ORDER — TOPIRAMATE 100 MG PO TABS: 100.0000 mg | ORAL_TABLET | Freq: Every day | ORAL | 0 refills | Status: DC

## 2017-09-21 MED ORDER — DULOXETINE HCL 60 MG PO CPEP: 60.0000 mg | ORAL_CAPSULE | Freq: Two times a day (BID) | ORAL | 0 refills | Status: DC

## 2017-09-21 MED ORDER — ESOMEPRAZOLE MAGNESIUM 40 MG PO CPDR: 40.0000 mg | DELAYED_RELEASE_CAPSULE | Freq: Every day | ORAL | 0 refills | Status: DC

## 2017-09-21 MED ORDER — RALOXIFENE HCL 60 MG PO TABS: 60.0000 mg | ORAL_TABLET | Freq: Every day | ORAL | 0 refills | Status: DC

## 2017-09-21 MED ORDER — ESTRADIOL 0.1 MG/GM VA CREA: 1.0000 | TOPICAL_CREAM | Freq: Every day | VAGINAL | 0 refills | Status: DC

## 2017-09-21 NOTE — Telephone Encounter (Signed)
Appt made with [facility]. In march they got sent to the wrong [facility]. Change pharmacy in epic [facility] in [place]. Please advise.

## 2017-09-21 NOTE — Telephone Encounter (Signed)
Daily maintenance medications refilled for 90 days with no additional refills.  Ambien should not need refilling until next month so I did not refill that one.   Melita was not at home so I advised her husband of the refills.  He said that [facility].  They will check on Monday and let us know if there is an issue.

## 2017-09-21 NOTE — Telephone Encounter (Signed)
Please advise 

## 2017-09-21 NOTE — Telephone Encounter (Signed)
Go ahead and send 190-day refill for all of her medications but she needs to be seen prior to the next refill

## 2017-10-04 DIAGNOSIS — M25562 Pain in left knee: Secondary | ICD-10-CM | POA: Diagnosis not present

## 2017-10-10 ENCOUNTER — Ambulatory Visit (INDEPENDENT_AMBULATORY_CARE_PROVIDER_SITE_OTHER): Payer: Medicare Other | Admitting: Family Medicine

## 2017-10-10 ENCOUNTER — Encounter: Payer: Self-pay | Admitting: Family Medicine

## 2017-10-10 VITALS — BP 105/69 | HR 80 | Temp 98.9°F | Ht 63.0 in | Wt 149.2 lb

## 2017-10-10 DIAGNOSIS — E785 Hyperlipidemia, unspecified: Secondary | ICD-10-CM

## 2017-10-10 DIAGNOSIS — Z6826 Body mass index (BMI) 26.0-26.9, adult: Secondary | ICD-10-CM | POA: Diagnosis not present

## 2017-10-10 DIAGNOSIS — K219 Gastro-esophageal reflux disease without esophagitis: Secondary | ICD-10-CM

## 2017-10-10 DIAGNOSIS — F3342 Major depressive disorder, recurrent, in full remission: Secondary | ICD-10-CM

## 2017-10-10 LAB — CBC WITH DIFFERENTIAL/PLATELET
Basophils Absolute: 0.1 10*3/uL (ref 0.0–0.2)
Basos: 1 %
EOS (ABSOLUTE): 0.3 10*3/uL (ref 0.0–0.4)
Eos: 3 %
Hematocrit: 39.9 % (ref 34.0–46.6)
Hemoglobin: 13.2 g/dL (ref 11.1–15.9)
Immature Grans (Abs): 0 10*3/uL (ref 0.0–0.1)
Immature Granulocytes: 0 %
Lymphocytes Absolute: 2.6 10*3/uL (ref 0.7–3.1)
Lymphs: 30 %
MCH: 30 pg (ref 26.6–33.0)
MCHC: 33.1 g/dL (ref 31.5–35.7)
MCV: 91 fL (ref 79–97)
Monocytes Absolute: 0.7 10*3/uL (ref 0.1–0.9)
Monocytes: 8 %
Neutrophils Absolute: 5.1 10*3/uL (ref 1.4–7.0)
Neutrophils: 58 %
Platelets: 316 10*3/uL (ref 150–450)
RBC: 4.4 x10E6/uL (ref 3.77–5.28)
RDW: 12.5 % (ref 12.3–15.4)
WBC: 8.7 10*3/uL (ref 3.4–10.8)

## 2017-10-10 LAB — CMP14+EGFR
ALT: 21 IU/L (ref 0–32)
AST: 19 IU/L (ref 0–40)
Albumin/Globulin Ratio: 1.4 (ref 1.2–2.2)
Albumin: 4.3 g/dL (ref 3.6–4.8)
Alkaline Phosphatase: 70 IU/L (ref 39–117)
BUN/Creatinine Ratio: 20 (ref 12–28)
BUN: 16 mg/dL (ref 8–27)
Bilirubin Total: 0.4 mg/dL (ref 0.0–1.2)
CO2: 26 mmol/L (ref 20–29)
Calcium: 9.4 mg/dL (ref 8.7–10.3)
Chloride: 101 mmol/L (ref 96–106)
Creatinine, Ser: 0.79 mg/dL (ref 0.57–1.00)
GFR calc Af Amer: 90 mL/min/{1.73_m2} (ref >59)
GFR calc non Af Amer: 78 mL/min/{1.73_m2} (ref >59)
Globulin, Total: 3.1 g/dL (ref 1.5–4.5)
Glucose: 98 mg/dL (ref 65–99)
Potassium: 4.3 mmol/L (ref 3.5–5.2)
Sodium: 143 mmol/L (ref 134–144)
Total Protein: 7.4 g/dL (ref 6.0–8.5)

## 2017-10-10 LAB — LIPID PANEL
Chol/HDL Ratio: 3.4 ratio (ref 0.0–4.4)
Cholesterol, Total: 224 mg/dL — ABNORMAL HIGH (ref 100–199)
HDL: 65 mg/dL (ref >39)
LDL Calculated: 143 mg/dL — ABNORMAL HIGH (ref 0–99)
Triglycerides: 80 mg/dL (ref 0–149)
VLDL Cholesterol Cal: 16 mg/dL (ref 5–40)

## 2017-10-10 MED ORDER — DULOXETINE HCL 60 MG PO CPEP: 60.0000 mg | ORAL_CAPSULE | Freq: Two times a day (BID) | ORAL | 3 refills | Status: DC

## 2017-10-10 MED ORDER — TOPIRAMATE 100 MG PO TABS: 100.0000 mg | ORAL_TABLET | Freq: Every day | ORAL | 3 refills | Status: DC

## 2017-10-10 MED ORDER — METAXALONE 800 MG PO TABS: 800.0000 mg | ORAL_TABLET | Freq: Three times a day (TID) | ORAL | 3 refills | Status: DC | PRN

## 2017-10-10 MED ORDER — RIZATRIPTAN BENZOATE 10 MG PO TBDP: 10.0000 mg | ORAL_TABLET | ORAL | 3 refills | Status: DC | PRN

## 2017-10-10 MED ORDER — ROSUVASTATIN CALCIUM 10 MG PO TABS: 10.0000 mg | ORAL_TABLET | Freq: Every day | ORAL | 3 refills | Status: DC

## 2017-10-10 MED ORDER — ESOMEPRAZOLE MAGNESIUM 40 MG PO CPDR: 40.0000 mg | DELAYED_RELEASE_CAPSULE | Freq: Every day | ORAL | 3 refills | Status: DC

## 2017-10-10 MED ORDER — ZOLPIDEM TARTRATE 10 MG PO TABS: 5.0000 mg | ORAL_TABLET | Freq: Every evening | ORAL | 1 refills | Status: DC | PRN

## 2017-10-10 MED ORDER — ESTRADIOL 0.1 MG/GM VA CREA: 1.0000 | TOPICAL_CREAM | Freq: Every day | VAGINAL | 3 refills | Status: DC

## 2017-10-10 MED ORDER — PROMETHAZINE HCL 25 MG PO TABS: 25.0000 mg | ORAL_TABLET | Freq: Four times a day (QID) | ORAL | 3 refills | Status: DC | PRN

## 2017-10-10 MED ORDER — RALOXIFENE HCL 60 MG PO TABS: 60.0000 mg | ORAL_TABLET | Freq: Every day | ORAL | 3 refills | Status: DC

## 2017-10-10 NOTE — Progress Notes (Signed)
BP 105/69   Pulse 80   Temp 98.9 F (37.2 C) (Oral)   Ht 5' 3"  (1.6 m)   Wt 149 lb 3.2 oz (67.7 kg)   BMI 26.43 kg/m    Subjective:    Patient ID: Selena Barnes, female    DOB: Jul 09, 1950, 67 y.o.   MRN: [id number]  HPI: Selena Barnes is a 67 y.o. female presenting on 10/10/2017 for Hyperlipidemia (check up on chronic medical conditions. Patient states that she is still having a sore throat that has been going on for over a year. Has been going to ENT- Dr. Redmond Baseman and they are going to send her to a dermatology) and Depression   HPI Depression and fibromyalgia-like illness Patient is coming in for depression and fibromyalgia-like illness recheck, she had been on the Cymbalta and was doing well for with it but because of some pharmacy mixup and then since then did not receive a refill requests on multiple occasions she is actually been off of it since March.  We have tried to send on multiple occasions and now she has talked with them and is choosing just to have them printed and mail it to them and see if that works better.  She says she is having a lot of myalgias and a little bit of anxiety and depression now but looks forward to getting back on her medication.  Depression screen [facility] 2/9 10/10/2017 05/03/2017 02/09/2017 11/14/2016 10/11/2016  Decreased Interest 0 0 0 0 1  Down, Depressed, Hopeless 2 1 0 0 1  PHQ - 2 Score 2 1 0 0 2  Altered sleeping 3 - - 1 2  Tired, decreased energy 3 - - 0 3  Change in appetite 3 - - 0 1  Feeling bad or failure about yourself  0 - - 0 0  Trouble concentrating 3 - - 0 0  Moving slowly or fidgety/restless 0 - - 0 0  Suicidal thoughts 0 - - 0 0  PHQ-9 Score 14 - - - 8  Difficult doing work/chores - - - - Not difficult at all    GERD Patient is currently on Nexium.  She denies any major symptoms or abdominal pain or belching or burping. She denies any blood in her stool or lightheadedness or dizziness.  She was doing well one on the medication but since  she has been off of it she has been having symptoms and would like to get back on it.  Hyperlipidemia Patient is coming in for recheck of his hyperlipidemia. The patient is currently taking fish oil and Crestor, has been out of them because of medication issues. They deny any issues with myalgias or history of liver damage from it. They deny any focal numbness or weakness or chest pain.   Relevant past medical, surgical, family and social history reviewed and updated as indicated. Interim medical history since our last visit reviewed. Allergies and medications reviewed and updated.  Review of Systems  Constitutional: Negative for chills and fever.  Eyes: Negative for redness and visual disturbance.  Respiratory: Negative for chest tightness and shortness of breath.   Cardiovascular: Negative for chest pain and leg swelling.  Gastrointestinal: Positive for abdominal pain. Negative for abdominal distention, constipation, diarrhea, nausea and vomiting.  Musculoskeletal: Positive for myalgias. Negative for back pain and gait problem.  Skin: Negative for rash.  Neurological: Negative for light-headedness and headaches.  Psychiatric/Behavioral: Positive for dysphoric mood and sleep disturbance. Negative for agitation, behavioral problems, self-injury  and suicidal ideas. The patient is nervous/anxious.   All other systems reviewed and are negative.   Per HPI unless specifically indicated above   Allergies as of 10/10/2017   No Known Allergies     Medication List        Accurate as of 10/10/17  8:28 AM. Always use your most recent med list.          aspirin 81 MG tablet Take 81 mg by mouth daily.   CITRACAL SLOW RELEASE PO Take 1,200 mg by mouth 2 (two) times daily before a meal.   DULoxetine 60 MG capsule Commonly known as:  CYMBALTA Take 1 capsule (60 mg total) by mouth 2 (two) times daily.   esomeprazole 40 MG capsule Commonly known as:  NEXIUM Take 1 capsule (40 mg total) by  mouth daily at 12 noon.   estradiol 0.1 MG/GM vaginal cream Commonly known as:  ESTRACE Place 1 Applicatorful vaginally at bedtime.   fish oil-omega-3 fatty acids 1000 MG capsule Take 2 g by mouth 2 (two) times daily.   metaxalone 800 MG tablet Commonly known as:  SKELAXIN Take 1 tablet (800 mg total) by mouth 3 (three) times daily as needed.   promethazine 25 MG tablet Commonly known as:  PHENERGAN Take 1 tablet (25 mg total) by mouth every 6 (six) hours as needed for nausea or vomiting.   raloxifene 60 MG tablet Commonly known as:  EVISTA Take 1 tablet (60 mg total) by mouth daily.   rizatriptan 10 MG disintegrating tablet Commonly known as:  MAXALT-MLT Take 1 tablet (10 mg total) by mouth as needed for migraine. May repeat in 2 hours if needed   rosuvastatin 10 MG tablet Commonly known as:  CRESTOR Take 1 tablet (10 mg total) by mouth daily.   topiramate 100 MG tablet Commonly known as:  TOPAMAX Take 1 tablet (100 mg total) by mouth daily.   Vitamin D3 2000 units capsule Take 2,000 Units by mouth 2 (two) times daily.   zolpidem 10 MG tablet Commonly known as:  AMBIEN Take 0.5 tablets (5 mg total) by mouth at bedtime as needed for sleep.          Objective:    BP 105/69   Pulse 80   Temp 98.9 F (37.2 C) (Oral)   Ht 5' 3"  (1.6 m)   Wt 149 lb 3.2 oz (67.7 kg)   BMI 26.43 kg/m   Wt Readings from Last 3 Encounters:  10/10/17 149 lb 3.2 oz (67.7 kg)  05/03/17 153 lb (69.4 kg)  02/09/17 141 lb 9.6 oz (64.2 kg)    Physical Exam  Constitutional: She is oriented to person, place, and time. She appears well-developed and well-nourished. No distress.  HENT:  Mouth/Throat: Oropharynx is clear and moist.  Eyes: Pupils are equal, round, and reactive to light. Conjunctivae and EOM are normal.  Neck: Neck supple. No thyromegaly present.  Cardiovascular: Normal rate, regular rhythm, normal heart sounds and intact distal pulses.  No murmur heard. Pulmonary/Chest:  Effort normal and breath sounds normal. No respiratory distress. She has no wheezes.  Abdominal: Soft. Bowel sounds are normal. She exhibits no distension. There is no tenderness. There is no guarding.  Musculoskeletal: Normal range of motion. She exhibits no edema or tenderness.  Lymphadenopathy:    She has no cervical adenopathy.  Neurological: She is alert and oriented to person, place, and time. Coordination normal.  Skin: Skin is warm and dry. No rash noted. She is not diaphoretic.  Psychiatric: She has a normal mood and affect. Her behavior is normal.  Nursing note and vitals reviewed.  Patient has cryptic tonsils which could be an indication for tonsil stones, recommended allergy medication first 3-6 months and let us know after that how she is doing.     Assessment & Plan:   Problem List Items Addressed This Visit      Digestive   GERD (gastroesophageal reflux disease)   Relevant Medications   esomeprazole (NEXIUM) 40 MG capsule   Other Relevant Orders   CBC with Differential/Platelet   CMP14+EGFR     Other   Depression   Relevant Medications   DULoxetine (CYMBALTA) 60 MG capsule   Hyperlipidemia with target LDL less than 100 - Primary   Relevant Medications   rosuvastatin (CRESTOR) 10 MG tablet   Other Relevant Orders   Lipid panel   BMI 26.0-26.9,adult       Follow up plan: Return in about 6 months (around 04/12/2018), or if symptoms worsen or fail to improve, for Hyperlipidemia and myalgia recheck.  Counseling provided for all of the vaccine components Orders Placed This Encounter  Procedures  . CBC with Differential/Platelet  . CMP14+EGFR  . Lipid panel    Caryl Pina, MD [facility] 10/10/2017, 8:28 AM

## 2017-10-11 MED ORDER — ROSUVASTATIN CALCIUM 20 MG PO TABS: 20.0000 mg | ORAL_TABLET | Freq: Every day | ORAL | 3 refills | Status: DC

## 2017-10-11 NOTE — Addendum Note (Signed)
Addended by: Karle Plumber on: 10/11/2017 04:38 PM   Modules accepted: Orders

## 2017-10-12 ENCOUNTER — Encounter: Payer: Self-pay | Admitting: Family Medicine

## 2017-10-12 ENCOUNTER — Ambulatory Visit (INDEPENDENT_AMBULATORY_CARE_PROVIDER_SITE_OTHER): Payer: Medicare Other

## 2017-10-12 ENCOUNTER — Ambulatory Visit (INDEPENDENT_AMBULATORY_CARE_PROVIDER_SITE_OTHER): Payer: Medicare Other | Admitting: Family Medicine

## 2017-10-12 VITALS — BP 127/73 | HR 78 | Temp 97.3°F | Ht 63.0 in | Wt 151.0 lb

## 2017-10-12 DIAGNOSIS — M79601 Pain in right arm: Secondary | ICD-10-CM

## 2017-10-12 DIAGNOSIS — M5412 Radiculopathy, cervical region: Secondary | ICD-10-CM | POA: Diagnosis not present

## 2017-10-12 DIAGNOSIS — G8929 Other chronic pain: Secondary | ICD-10-CM

## 2017-10-12 DIAGNOSIS — M50322 Other cervical disc degeneration at C5-C6 level: Secondary | ICD-10-CM | POA: Diagnosis not present

## 2017-10-12 DIAGNOSIS — M25511 Pain in right shoulder: Secondary | ICD-10-CM | POA: Diagnosis not present

## 2017-10-12 MED ORDER — PREDNISONE 10 MG PO TABS: ORAL_TABLET | ORAL | 0 refills | Status: DC

## 2017-10-12 NOTE — Progress Notes (Signed)
Chief Complaint  Patient presents with  . rght upper arm pain    x 2 weeks - worse at night     HPI  Patient presents today for pain thatt is concentrated at the ball of the shoulder and the superior tapezius, but radiated to/from the posterior neck. Exacerbation 2 weeks ago becoming more severe with time and now intolerable. Some symptoms intermittently present for a year. Pain worse at night.   PMH: Smoking status noted ROS: Review of Systems  Constitutional: Negative for fever.  HENT: Negative for congestion, rhinorrhea and sore throat.   Respiratory: Negative for cough and shortness of breath.   Cardiovascular: Negative for chest pain and palpitations.  Gastrointestinal: Negative for abdominal pain.  Musculoskeletal: Positive for arthralgias, myalgias and neck pain.     Objective: BP 127/73 (BP Location: Left Arm)   Pulse 78   Temp (!) 97.3 F (36.3 C) (Oral)   Ht 5\' 3"  (1.6 m)   Wt 151 lb (68.5 kg)   BMI 26.75 kg/m  Gen: NAD, alert, cooperative with exam HEENT: NCAT, EOMI, PERRL CV: RRR, good S1/S2, no murmur Resp: CTABL, no wheezes, non-labored Abd: SNTND, BS present, no guarding or organomegaly Ext: No edema, warm Neuro: Alert and oriented, No gross deficits  Assessment and plan:  1. Right arm pain   2. Chronic right shoulder pain   3. Cervical neuralgia     Meds ordered this encounter  Medications  . predniSONE (DELTASONE) 10 MG tablet    Sig: Take 5 daily for 3 days followed by 4,3,2 and 1 for 3 days each.    Dispense:  45 tablet    Refill:  0    Orders Placed This Encounter  Procedures  . DG Shoulder Right    Standing Status:   Future    Number of Occurrences:   1    Standing Expiration Date:   12/12/2018    Order Specific Question:   Reason for Exam (SYMPTOM  OR DIAGNOSIS REQUIRED)    Answer:   upper right arm pain    Order Specific Question:   Preferred imaging location?    Answer:   Internal  . DG Cervical Spine Complete    Standing Status:    Future    Number of Occurrences:   1    Standing Expiration Date:   12/12/2018    Order Specific Question:   Reason for Exam (SYMPTOM  OR DIAGNOSIS REQUIRED)    Answer:   upper right arm pain    Order Specific Question:   Preferred imaging location?    Answer:   Internal  . Ambulatory referral to Orthopedics    Referral Priority:   Routine    Referral Type:   Consultation    Referral Reason:   Specialty Services Required    Number of Visits Requested:   1    Follow up as needed.  Claretta Fraise, MD

## 2017-10-17 ENCOUNTER — Encounter (INDEPENDENT_AMBULATORY_CARE_PROVIDER_SITE_OTHER): Payer: Self-pay | Admitting: Orthopaedic Surgery

## 2017-10-17 ENCOUNTER — Ambulatory Visit (INDEPENDENT_AMBULATORY_CARE_PROVIDER_SITE_OTHER): Payer: Medicare Other | Admitting: Orthopaedic Surgery

## 2017-10-17 VITALS — BP 111/67 | HR 84 | Ht 63.0 in | Wt 150.0 lb

## 2017-10-17 DIAGNOSIS — M7541 Impingement syndrome of right shoulder: Secondary | ICD-10-CM | POA: Diagnosis not present

## 2017-10-17 DIAGNOSIS — M5030 Other cervical disc degeneration, unspecified cervical region: Secondary | ICD-10-CM

## 2017-10-17 DIAGNOSIS — M5412 Radiculopathy, cervical region: Secondary | ICD-10-CM | POA: Diagnosis not present

## 2017-10-17 NOTE — Progress Notes (Signed)
Office Visit Note   Patient: Selena Barnes           Date of Birth: [birth year]           MRN: [id number] Visit Date: 10/17/2017              Requested by: Claretta Fraise, MD [place], [place] [address] PCP: Dettinger, Fransisca Kaufmann, MD   Assessment & Plan: Visit Diagnoses:  1. Radiculopathy, cervical region   2. Other cervical disc degeneration, unspecified cervical region   3. Impingement syndrome of right shoulder     Plan: With patient's ongoing symptoms with her cervical spine and failed conservative treatment up to this point with prednisone taper, formal PT, activity modification I recommend getting a cervical MRI to rule out HNP/stenosis.  Follow-up with Dr. Lorin Mercy after completion to discuss results and further treatment options.  On exam and history patient also does have symptoms of right shoulder impingement and we did discuss possible subacromial Marcaine/Depo-Medrol injection when she returns.  Since she is not really noticing a huge difference with prednisone taper I think it may be reasonable for her to take this for another day.  She was originally scheduled to take a 15-day taper and I told her that would be up to her.    Follow-Up Instructions: Return in about 3 weeks (around 11/07/2017) for With Dr. Lorin Mercy to review cervical MRI.   Orders:  Orders Placed This Encounter  Procedures  . MR Cervical Spine w/o contrast   No orders of the defined types were placed in this encounter.     Procedures: No procedures performed   Clinical Data: No additional findings.   Subjective: Chief Complaint  Patient presents with  . Neck - Pain  . Right Shoulder - Pain    HPI 67 year old white female who is new patient to our office comes in with complaints of neck pain, right upper extremity radiculopathy, and right shoulder pain.  States that she has had off-and-on symptoms for several years but this is been worse the last couple of weeks.  Pain in the right and left  side of the neck with radiation to the right shoulder and scapular area.  Pain also radiates down to the right forearm at times.  No radiation of pain  down the left arm.  No complaints of numbness tingling weakness.  Scribes a burning sensation in the right lateral shoulder.  Shoulder pain described as being constant and may be somewhat aggravated with overhead reaching and laying on the shoulder.  No injury.  Patient was started on a 15-day prednisone taper August 31 by her primary care physician.  She has not noticed any change in her symptoms.  Has also taken a muscle relaxer.  X-ray cervical spine and right shoulder obtained October 12, 2017 and report showed:  CLINICAL DATA:  Neck and right upper extremity pain. No reported injury.  EXAM: CERVICAL SPINE - COMPLETE 4+ VIEW  COMPARISON:  None.  FINDINGS: On the lateral view the cervical spine is visualized to the level of C7-T1. There is a normal cervical lordosis. Pre-vertebral soft tissues are within normal limits. No fracture is detected in the cervical spine. Dens is well positioned between the lateral masses of C1. Mild degenerative disc disease at C4-5 and C5-6. No subluxation. Minimal bilateral facet arthropathy. Suggestion of mild degenerative foraminal stenosis on the right at C4-5. No aggressive-appearing focal osseous lesions.  IMPRESSION: 1. Mild degenerative disc disease at C4-5 and C5-6.  2. Minimal bilateral cervical facet arthropathy. 3. Mild degenerative foraminal stenosis on the right at C4-5.   CLINICAL DATA:  Right upper extremity pain.  No reported injury.  EXAM: RIGHT SHOULDER - 2+ VIEW  COMPARISON:  None.  FINDINGS: There is no evidence of fracture or dislocation. There is no evidence of arthropathy or other focal bone abnormality. Soft tissues are unremarkable.  IMPRESSION: No acute osseous abnormality.  No significant arthropathy.  Review of Systems No current cardiac pulmonary GI GU  issues  Objective: Vital Signs: BP 111/67   Pulse 84   Ht 5\' 3"  (1.6 m)   Wt 150 lb (68 kg)   BMI 26.57 kg/m   Physical Exam  Constitutional: She appears well-developed. No distress.  HENT:  Head: Normocephalic and atraumatic.  Eyes: Pupils are equal, round, and reactive to light. EOM are normal.  Neck:  Decreased cervical spine range of motion due to pain and stiffness.  Markedly positive right brachial plexus trapezius and scapular border tenderness.  Less tender over the left trapezius and left scapular border.  Positive right Spurling test.  Some improvement of right shoulder and arm symptoms with cervical distraction.  Right shoulder good range of motion.  Negative drop arm test.  Mild to moderate positive impingement test.  Tender over the proximal biceps tendon.  No tendon defect.  Good cuff strength.  Minimal discomfort with supraspinatus resistance.  Left shoulder, bilateral elbows and wrists unremarkable.  No focal motor deficits upper and lower extremity's.  Neurovascular intact.    Ortho Exam  Specialty Comments:  No specialty comments available.  Imaging: No results found.   PMFS History: Patient Active Problem List   Diagnosis Date Noted  . Constipation due to outlet dysfunction   . Osteopenia with high risk of fracture 02/05/2015  . BMI 26.0-26.9,adult 05/21/2014  . Heel spur 05/21/2014  . Migraines 12/20/2012  . Insomnia 12/20/2012  . Depression 12/20/2012  . GERD (gastroesophageal reflux disease) 12/20/2012  . Hyperlipidemia with target LDL less than 100 12/20/2012   Past Medical History:  Diagnosis Date  . Arthritis   . Constipation   . Depression   . GERD (gastroesophageal reflux disease)   . Hyperlipidemia   . Insomnia   . Migraines   . Osteopenia     Family History  Problem Relation Age of Onset  . Heart disease Mother 43       cause of death  . Heart disease Father 88       death  . Kidney disease Father   . Diabetes Brother   . Cancer  Brother 70       bone marrow cancer  . Diabetes Grandchild        grandson    Past Surgical History:  Procedure Laterality Date  . ABDOMINAL HYSTERECTOMY    . ANAL RECTAL MANOMETRY N/A 10/06/2015   Procedure: ANO RECTAL MANOMETRY;  Surgeon: Gatha Mayer, MD;  Location: [facility];  Service: Endoscopy;  Laterality: N/A;  . BREAST BIOPSY Right   . BREAST EXCISIONAL BIOPSY Right    benign  . CATARACT EXTRACTION W/ INTRAOCULAR LENS IMPLANT Bilateral 07/27/2016   and 09/27/2016, [facility] in [place]  . CATARACT EXTRACTION, BILATERAL    . CHOLECYSTECTOMY    . LYMPH NODE BIOPSY Left    left arm   Social History   Occupational History  . Occupation: retired  Tobacco Use  . Smoking status: Never Smoker  . Smokeless tobacco: Never Used  Substance and  Sexual Activity  . Alcohol use: No    Alcohol/week: 0.0 standard drinks  . Drug use: No  . Sexual activity: Yes

## 2017-10-21 ENCOUNTER — Encounter: Payer: Self-pay | Admitting: Family Medicine

## 2017-11-01 ENCOUNTER — Ambulatory Visit
Admission: RE | Admit: 2017-11-01 | Discharge: 2017-11-01 | Disposition: A | Discharge status: Home / Self Care | Payer: Medicare Other | Source: Ambulatory Visit | Attending: Surgery | Admitting: Surgery

## 2017-11-01 DIAGNOSIS — M542 Cervicalgia: Secondary | ICD-10-CM | POA: Diagnosis not present

## 2017-11-01 DIAGNOSIS — M5030 Other cervical disc degeneration, unspecified cervical region: Secondary | ICD-10-CM

## 2017-11-07 ENCOUNTER — Encounter (INDEPENDENT_AMBULATORY_CARE_PROVIDER_SITE_OTHER): Payer: Self-pay | Admitting: Orthopaedic Surgery

## 2017-11-07 ENCOUNTER — Ambulatory Visit (INDEPENDENT_AMBULATORY_CARE_PROVIDER_SITE_OTHER): Payer: Medicare Other | Admitting: Orthopaedic Surgery

## 2017-11-07 VITALS — BP 117/74 | HR 84 | Ht 64.0 in | Wt 150.0 lb

## 2017-11-07 DIAGNOSIS — M5030 Other cervical disc degeneration, unspecified cervical region: Secondary | ICD-10-CM | POA: Diagnosis not present

## 2017-11-07 NOTE — Progress Notes (Signed)
Office Visit Note   Patient: Selena Barnes           Date of Birth: 11/07/1950           MRN: [id number] Visit Date: 11/07/2017              Requested by: Dettinger, Fransisca Kaufmann, MD [place], [address] [address] PCP: Dettinger, Fransisca Kaufmann, MD   Assessment & Plan: Visit Diagnoses:  1. Other cervical disc degeneration, unspecified cervical region     Plan: My scan is reviewed no surgery is indicated at this point.  Set up for some physical therapy.  Home traction unit supplied since she gets relief with distraction she can use it a few times a day.  We will set her up for therapy closer to her home which would be in [place], [place].  Recheck 6 weeks.  Follow-Up Instructions: Return in about 6 weeks (around 12/19/2017).   Orders:  No orders of the defined types were placed in this encounter.  No orders of the defined types were placed in this encounter.     Procedures: No procedures performed   Clinical Data: No additional findings.   Subjective: Chief Complaint  Patient presents with  . Neck - Pain, Follow-up    MRI Cervical Spine Review    HPI 67 year old female returns with ongoing pain with neck and shoulder pain.  She said some burning that radiates to the forearm.  She has not noticed any numbness in her feet she is had some problems with her knee previous injection some tenderness over trochanters.  She does have positive history for depression also has migraines.  MRI scan is been obtained and is available for review.  Review of Systems 14 point review of system positive for depression, GERD, hyperlipidemia, insomnia, migraines, osteopenia, history of gallbladder removal hysterectomy appendix, vaginal delivery x1.   Objective: Vital Signs: BP 117/74   Pulse 84   Ht 5\' 4"  (1.626 m)   Wt 150 lb (68 kg)   BMI 25.75 kg/m   Physical Exam  Constitutional: She is oriented to person, place, and time. She appears well-developed.  HENT:  Head:  Normocephalic.  Right Ear: External ear normal.  Left Ear: External ear normal.  Eyes: Pupils are equal, round, and reactive to light.  Neck: No tracheal deviation present. No thyromegaly present.  Cardiovascular: Normal rate.  Pulmonary/Chest: Effort normal.  Abdominal: Soft.  Neurological: She is alert and oriented to person, place, and time.  Skin: Skin is warm and dry.  Psychiatric: She has a normal mood and affect. Her behavior is normal.    Ortho Exam patient has 50% rotation of her neck.  Bilateral brachial plexus tenderness which is moderate.  Mild positive impingement right greater than left shoulder.  Reflexes are symmetrical, biceps triceps brachioradialis.  She is mildly tender over both long head of the biceps tendon negative drop arm test.  Good grip strength normal heel toe gait.  She is tender over greater trochanters right and left.  Some sciatic notch tenderness mild lumbar tenderness.  Some tenderness over the pes bursa more on the left than right.  Negative straight leg raising.  Anterior tib gastrocsoleus is intact.  Increased pain with cervical compression she gets relief with distraction.  Specialty Comments:  No specialty comments available.  Imaging: CLINICAL DATA:  Neck pain, right upper extremity radiculopathy  EXAM: MRI CERVICAL SPINE WITHOUT CONTRAST  TECHNIQUE: Multiplanar, multisequence MR imaging of the cervical spine  was performed. No intravenous contrast was administered.  COMPARISON:  None.  FINDINGS: Alignment: Physiologic.  Vertebrae: No fracture, evidence of discitis, or bone lesion.  Cord: Normal signal and morphology.  Posterior Fossa, vertebral arteries, paraspinal tissues: Posterior fossa demonstrates no focal abnormality. Vertebral artery flow voids are maintained. Paraspinal soft tissues are unremarkable.  Disc levels:  Discs: Disc spaces are relatively well maintained.  C2-3: No significant disc bulge. No neural  foraminal stenosis. No central canal stenosis.  C3-4: No significant disc bulge. No neural foraminal stenosis. No central canal stenosis.  C4-5: Broad-based disc bulge with a tiny central disc protrusion. Mild left foraminal narrowing. No right foraminal narrowing. No central canal stenosis.  C5-6: Mild broad-based disc bulge. Mild left foraminal narrowing. No neural foraminal stenosis. No central canal stenosis.  C6-7: Broad-based disc bulge. No neural foraminal stenosis. No central canal stenosis.  C7-T1: No significant disc bulge. No neural foraminal stenosis. No central canal stenosis.  IMPRESSION: 1. Mild cervical spine spondylosis as described above.   Electronically Signed   By: Kathreen Devoid   On: 11/01/2017 19:15    PMFS History: Patient Active Problem List   Diagnosis Date Noted  . Constipation due to outlet dysfunction   . Osteopenia with high risk of fracture 02/05/2015  . BMI 26.0-26.9,adult 05/21/2014  . Heel spur 05/21/2014  . Migraines 12/20/2012  . Insomnia 12/20/2012  . Depression 12/20/2012  . GERD (gastroesophageal reflux disease) 12/20/2012  . Hyperlipidemia with target LDL less than 100 12/20/2012   Past Medical History:  Diagnosis Date  . Arthritis   . Constipation   . Depression   . GERD (gastroesophageal reflux disease)   . Hyperlipidemia   . Insomnia   . Migraines   . Osteopenia     Family History  Problem Relation Age of Onset  . Heart disease Mother 72       cause of death  . Heart disease Father 54       death  . Kidney disease Father   . Diabetes Brother   . Cancer Brother 70       bone marrow cancer  . Diabetes Grandchild        grandson    Past Surgical History:  Procedure Laterality Date  . ABDOMINAL HYSTERECTOMY    . ANAL RECTAL MANOMETRY N/A 10/06/2015   Procedure: ANO RECTAL MANOMETRY;  Surgeon: Gatha Mayer, MD;  Location: [facility];  Service: Endoscopy;  Laterality: N/A;  . BREAST BIOPSY Right     . BREAST EXCISIONAL BIOPSY Right    benign  . CATARACT EXTRACTION W/ INTRAOCULAR LENS IMPLANT Bilateral 07/27/2016   and 09/27/2016, [employer] [facility] in [place]  . CATARACT EXTRACTION, BILATERAL    . CHOLECYSTECTOMY    . LYMPH NODE BIOPSY Left    left arm   Social History   Occupational History  . Occupation: retired  Tobacco Use  . Smoking status: Never Smoker  . Smokeless tobacco: Never Used  Substance and Sexual Activity  . Alcohol use: No    Alcohol/week: 0.0 standard drinks  . Drug use: No  . Sexual activity: Yes

## 2017-11-07 NOTE — Addendum Note (Signed)
Addended by: Meyer Cory on: 11/07/2017 09:35 AM   Modules accepted: Orders

## 2017-11-13 ENCOUNTER — Other Ambulatory Visit: Payer: Self-pay

## 2017-11-13 ENCOUNTER — Encounter: Payer: Self-pay | Admitting: Physical Therapy

## 2017-11-13 ENCOUNTER — Ambulatory Visit: Payer: Medicare Other | Attending: Orthopaedic Surgery | Admitting: Physical Therapy

## 2017-11-13 DIAGNOSIS — M542 Cervicalgia: Secondary | ICD-10-CM | POA: Insufficient documentation

## 2017-11-13 DIAGNOSIS — R293 Abnormal posture: Secondary | ICD-10-CM | POA: Diagnosis not present

## 2017-11-13 NOTE — Therapy (Signed)
[facility] [place], [name], [address] Phone: [phone]   Fax:  [phone]  Physical Therapy Evaluation  Patient Details  Name: Selena Barnes MRN: [id number] Date of Birth: Sep 03, 1950 Referring Provider (PT): Rodell Perna MD.   Encounter Date: 11/13/2017  PT End of Session - 11/13/17 1204    Visit Number  1    Number of Visits  12    Date for PT Re-Evaluation  12/25/17    PT Start Time  0950    PT Stop Time  1043    PT Time Calculation (min)  53 min    Activity Tolerance  Patient tolerated treatment well   Patient stated following treatment:  "I feel good."   Behavior During Therapy  [facility] for tasks assessed/performed       Past Medical History:  Diagnosis Date  . Arthritis   . Constipation   . Depression   . GERD (gastroesophageal reflux disease)   . Hyperlipidemia   . Insomnia   . Migraines   . Osteopenia     Past Surgical History:  Procedure Laterality Date  . ABDOMINAL HYSTERECTOMY    . ANAL RECTAL MANOMETRY N/A 10/06/2015   Procedure: ANO RECTAL MANOMETRY;  Surgeon: Gatha Mayer, MD;  Location: [facility];  Service: Endoscopy;  Laterality: N/A;  . BREAST BIOPSY Right   . BREAST EXCISIONAL BIOPSY Right    benign  . CATARACT EXTRACTION W/ INTRAOCULAR LENS IMPLANT Bilateral 07/27/2016   and 09/27/2016, [employer] in [place]  . CATARACT EXTRACTION, BILATERAL    . CHOLECYSTECTOMY    . LYMPH NODE BIOPSY Left    left arm    There were no vitals filed for this visit.   Subjective Assessment - 11/13/17 1130    Subjective  The patient presents to the clinic today with chronic neck pain.  She also has a long h/o migraines.  Her CC today is right sided neck pain and "pins and needles" down the right arm.  Patient was prescribed a home traction unit as she received relief with distraction per her Ortho eval.  She enjoys the home traction unit.  Incidently, she reports that weight felt too  light so she pulled down on the rope of the unit to increase the amount of pull.  Her pain is about a 6 today.  Certain neck movements increase her pain and rest decreases her pain.    Patient is accompained by:  Family member   Husband.   Pertinent History  Arhritis; Migraines; osteopenia.    Diagnostic tests  MRI and X-ray (See under "Imaging" tab.    Patient Stated Goals  Decrease pain and get rid of pins and needles in right arm.    Currently in Pain?  Yes    Pain Score  6     Pain Location  Neck    Pain Orientation  Right    Pain Descriptors / Indicators  Throbbing   Pins and needles.   Pain Type  Chronic pain    Pain Radiating Towards  Right UE.    Pain Onset  More than a month ago    Pain Frequency  Constant    Aggravating Factors   See above.    Pain Relieving Factors  See above.         [facility] PT Assessment - 11/13/17 0001      Assessment   Medical Diagnosis  Cervical disc degeneration.    Referring Provider (PT)  Rodell Perna MD.    Onset Date/Surgical Date  --   Ongoing.   Hand Dominance  Right      Precautions   Precaution Comments  --   Osteopenia.     Restrictions   Weight Bearing Restrictions  No      Balance Screen   Has the patient fallen in the past 6 months  No    Has the patient had a decrease in activity level because of a fear of falling?   No    Is the patient reluctant to leave their home because of a fear of falling?   No      Home Environment   Living Environment  Private residence      Prior Function   Level of Independence  Independent      Posture/Postural Control   Posture/Postural Control  Postural limitations    Postural Limitations  Rounded Shoulders;Forward head      ROM / Strength   AROM / PROM / Strength  AROM;Strength      AROM   Overall AROM Comments  Right active cervical rotation= 45 degrees, left= 50 degrees and bilateral sidebending is 15 degrees.      Strength   Overall Strength Comments  Normal bilateral UE strength.   Right grip= 25# and left= 35#.      Palpation   Palpation comment  Tender to palpation over bilateral suboccipital, cervical paraspinals and UT's.       Special Tests   Other special tests  Bilateral UE DTR's= 1+/4+.                Objective measurements completed on examination: See above findings.      [facility] Adult PT Treatment/Exercise - 11/13/17 0001      Modalities   Modalities  Traction      Traction   Type of Traction  Cervical    Min (lbs)  0    Max (lbs)  12    Hold Time  99    Rest Time  5    Time  15             PT Education - 11/13/17 1159    Education Details  Chin tuck and cervical extension.    Person(s) Educated  Patient    Methods  Explanation    Comprehension  Verbalized understanding       PT Short Term Goals - 11/13/17 1302      PT SHORT TERM GOAL #1   Title  STG's=LTG's.        PT Long Term Goals - 11/13/17 1308      PT LONG TERM GOAL #1   Title  independent with HEP    Time  6    Period  Weeks    Status  New      PT LONG TERM GOAL #2   Title  Increase active cervical rotation to 60-65 degrees+ so patient can turn head more easily while driving.    Time  6    Period  Weeks    Status  New      PT LONG TERM GOAL #3   Title  Eliminate right UE symptoms.    Time  6    Period  Weeks    Status  New             Plan - 11/13/17 1248    Clinical Impression Statement  The patient presents to O[facility] with c/o chronic neck  pain.  She has a notable loss of active cervical motion and a loss of right grip strength per contralateral comparison.  Her right UE is remarkable for nearly constant "pins and needles.'  She also has a h/o migraines and has for many years.  She has an over the door traction unit which has been helpful.  Patient will benefit from skilled physical therapy intervention to address deficits.    History and Personal Factors relevant to plan of care:  Arhritis; Migraines; osteopenia.    Clinical  Presentation  Evolving    Clinical Presentation due to:  Improving.    Clinical Decision Making  Moderate    Rehab Potential  Good    Clinical Impairments Affecting Rehab Potential  Chronicity.    PT Frequency  2x / week    PT Duration  6 weeks    PT Treatment/Interventions  ADLs/Self Care Home Management;Cryotherapy;Electrical Stimulation;Ultrasound;Traction;Moist Heat;Therapeutic activities;Therapeutic exercise;Patient/family education;Manual techniques;Passive range of motion;Dry needling    PT Next Visit Plan  Manual cervical traction; combo e'stim/U/S; HMP and electrical stimulation; chin tucks and cervical extension; progress traction by 1# up to 15# and then re-assess response.      Consulted and Agree with Plan of Care  Patient       Patient will benefit from skilled therapeutic intervention in order to improve the following deficits and impairments:  Pain, Decreased activity tolerance, Decreased strength, Postural dysfunction  Visit Diagnosis: Cervicalgia - Plan: PT plan of care cert/re-cert  Abnormal posture - Plan: PT plan of care cert/re-cert     Problem List Patient Active Problem List   Diagnosis Date Noted  . Other cervical disc degeneration, unspecified cervical region 11/07/2017  . Constipation due to outlet dysfunction   . Osteopenia with high risk of fracture 02/05/2015  . BMI 26.0-26.9,adult 05/21/2014  . Heel spur 05/21/2014  . Migraines 12/20/2012  . Insomnia 12/20/2012  . Depression 12/20/2012  . GERD (gastroesophageal reflux disease) 12/20/2012  . Hyperlipidemia with target LDL less than 100 12/20/2012    Libbie Bartley, [place] MPT 11/13/2017, 1:10 PM  [facility] [place], [name], [address] Phone: [phone]   Fax:  [phone]  Name: Selena Barnes MRN: [id number] Date of Birth: January 25, 1951

## 2017-11-16 ENCOUNTER — Ambulatory Visit: Payer: Medicare Other | Admitting: *Deleted

## 2017-11-16 DIAGNOSIS — M542 Cervicalgia: Secondary | ICD-10-CM | POA: Diagnosis not present

## 2017-11-16 DIAGNOSIS — R293 Abnormal posture: Secondary | ICD-10-CM | POA: Diagnosis not present

## 2017-11-16 NOTE — Therapy (Signed)
[facility] [place], [name], [address] Phone: [phone]   Fax:  [phone]  Physical Therapy Treatment  Patient Details  Name: Selena Barnes MRN: [id number] Date of Birth: [birth year] Referring Provider (PT): Rodell Perna MD.   Encounter Date: 11/16/2017  PT End of Session - 11/16/17 1100    Visit Number  2    Number of Visits  12    Date for PT Re-Evaluation  12/25/17    PT Start Time  0945    PT Stop Time  1033    PT Time Calculation (min)  48 min       Past Medical History:  Diagnosis Date  . Arthritis   . Constipation   . Depression   . GERD (gastroesophageal reflux disease)   . Hyperlipidemia   . Insomnia   . Migraines   . Osteopenia     Past Surgical History:  Procedure Laterality Date  . ABDOMINAL HYSTERECTOMY    . ANAL RECTAL MANOMETRY N/A 10/06/2015   Procedure: ANO RECTAL MANOMETRY;  Surgeon: Gatha Mayer, MD;  Location: [facility];  Service: Endoscopy;  Laterality: N/A;  . BREAST BIOPSY Right   . BREAST EXCISIONAL BIOPSY Right    benign  . CATARACT EXTRACTION W/ INTRAOCULAR LENS IMPLANT Bilateral 07/27/2016   and 09/27/2016, [employer] in [place]  . CATARACT EXTRACTION, BILATERAL    . CHOLECYSTECTOMY    . LYMPH NODE BIOPSY Left    left arm    There were no vitals filed for this visit.                    [facility] Adult PT Treatment/Exercise - 11/16/17 0001      Modalities   Modalities  Electrical Stimulation;Ultrasound;Traction      Ultrasound   Ultrasound Location  RT cervical paras    Ultrasound Parameters  combo 1.5 w /cm2 x 12 mins    Ultrasound Goals  Pain      Manual Therapy   Manual Therapy  Soft tissue mobilization;Manual Traction    Soft tissue mobilization  cervical paraspinals RT and LT side in supine position    Manual Traction  manual traction and OA release               PT Short Term Goals - 11/13/17 1302      PT SHORT TERM  GOAL #1   Title  STG's=LTG's.        PT Long Term Goals - 11/13/17 1308      PT LONG TERM GOAL #1   Title  independent with HEP    Time  6    Period  Weeks    Status  New      PT LONG TERM GOAL #2   Title  Increase active cervical rotation to 60-65 degrees+ so patient can turn head more easily while driving.    Time  6    Period  Weeks    Status  New      PT LONG TERM GOAL #3   Title  Eliminate right UE symptoms.    Time  6    Period  Weeks    Status  New            Plan - 11/16/17 0949    Clinical Impression Statement  Pt arrived today doing fairly well after last Rx with traction. [place] / estim combo, STW   and manual traction was performed today and pt  tolerated very well. She had notable tenderness along both RT side cervical  paras and RT [place] during STW. Decreased pain after Rx.    Clinical Presentation  Evolving    Rehab Potential  Good    Clinical Impairments Affecting Rehab Potential  Chronicity.    PT Frequency  2x / week    PT Duration  6 weeks    PT Treatment/Interventions  ADLs/Self Care Home Management;Cryotherapy;Electrical Stimulation;Ultrasound;Traction;Moist Heat;Therapeutic activities;Therapeutic exercise;Patient/family education;Manual techniques;Passive range of motion;Dry needling    PT Next Visit Plan  Manual cervical traction; combo e'stim/U/S; HMP and electrical stimulation; chin tucks and cervical extension; progress traction by 1# up to 15# and then re-assess response.         Patient will benefit from skilled therapeutic intervention in order to improve the following deficits and impairments:  Pain, Decreased activity tolerance, Decreased strength, Postural dysfunction  Visit Diagnosis: Cervicalgia  Abnormal posture     Problem List Patient Active Problem List   Diagnosis Date Noted  . Other cervical disc degeneration, unspecified cervical region 11/07/2017  . Constipation due to outlet dysfunction   . Osteopenia with high risk of  fracture 02/05/2015  . BMI 26.0-26.9,adult 05/21/2014  . Heel spur 05/21/2014  . Migraines 12/20/2012  . Insomnia 12/20/2012  . Depression 12/20/2012  . GERD (gastroesophageal reflux disease) 12/20/2012  . Hyperlipidemia with target LDL less than 100 12/20/2012    RAMSEUR,CHRIS, PTA 11/16/2017, 11:08 AM  [facility] [place], [name], [address] Phone: [phone]   Fax:  [phone]  Name: Selena Barnes MRN: [id number] Date of Birth: 04-03-1950

## 2017-11-19 ENCOUNTER — Ambulatory Visit: Payer: Medicare Other | Admitting: Physical Therapy

## 2017-11-19 ENCOUNTER — Encounter: Payer: Self-pay | Admitting: Physical Therapy

## 2017-11-19 DIAGNOSIS — R293 Abnormal posture: Secondary | ICD-10-CM | POA: Diagnosis not present

## 2017-11-19 DIAGNOSIS — M542 Cervicalgia: Secondary | ICD-10-CM

## 2017-11-19 NOTE — Therapy (Signed)
[facility] [place], [name], [address] Phone: [phone]   Fax:  [phone]  Physical Therapy Treatment  Patient Details  Name: Selena Barnes MRN: [id number] Date of Birth: [birth year] Referring Provider (PT): Rodell Perna MD.   Encounter Date: 11/19/2017  PT End of Session - 11/19/17 1010    Visit Number  3    Number of Visits  12    Date for PT Re-Evaluation  12/25/17    PT Start Time  0901    PT Stop Time  0947    PT Time Calculation (min)  46 min    Activity Tolerance  Patient tolerated treatment well    Behavior During Therapy  [facility] for tasks assessed/performed       Past Medical History:  Diagnosis Date  . Arthritis   . Constipation   . Depression   . GERD (gastroesophageal reflux disease)   . Hyperlipidemia   . Insomnia   . Migraines   . Osteopenia     Past Surgical History:  Procedure Laterality Date  . ABDOMINAL HYSTERECTOMY    . ANAL RECTAL MANOMETRY N/A 10/06/2015   Procedure: ANO RECTAL MANOMETRY;  Surgeon: Gatha Mayer, MD;  Location: [facility];  Service: Endoscopy;  Laterality: N/A;  . BREAST BIOPSY Right   . BREAST EXCISIONAL BIOPSY Right    benign  . CATARACT EXTRACTION W/ INTRAOCULAR LENS IMPLANT Bilateral 07/27/2016   and 09/27/2016, [employer] in [place]  . CATARACT EXTRACTION, BILATERAL    . CHOLECYSTECTOMY    . LYMPH NODE BIOPSY Left    left arm    There were no vitals filed for this visit.  Subjective Assessment - 11/19/17 1010    Subjective  The treatments are helping.  The doctor told me to work up to 10# with my home traction unit and go up from there if helpful.  He also said I'd be getting traction at therapy.    Patient is accompained by:  Family member    Pertinent History  Arhritis; Migraines; osteopenia.    Diagnostic tests  MRI and X-ray (See under "Imaging" tab.    Patient Stated Goals  Decrease pain and get rid of pins and needles in right arm.    Currently in Pain?  Yes    Pain Score  3     Pain Location  Neck    Pain Orientation  Right    Pain Descriptors / Indicators  Throbbing    Pain Onset  More than a month ago                       [facility] Adult PT Treatment/Exercise - 11/19/17 0001      Modalities   Modalities  Traction;Ultrasound      Ultrasound   Ultrasound Location  In prone:  Combo e'stim/U/S at 1.50 W/CM2 x 12 minutes to affected cervical region.    Ultrasound Goals  Pain   Tone.     Traction   Type of Traction  Cervical    Min (lbs)  0    Max (lbs)  12    Hold Time  99    Rest Time  5    Time  10      Manual Therapy   Manual Therapy  Soft tissue mobilization    Soft tissue mobilization  In prone:  STW/M with focus on right suboccipital region including ischemic release technique x 11 minutes.  PT Short Term Goals - 11/13/17 1302      PT SHORT TERM GOAL #1   Title  STG's=LTG's.        PT Long Term Goals - 11/13/17 1308      PT LONG TERM GOAL #1   Title  independent with HEP    Time  6    Period  Weeks    Status  New      PT LONG TERM GOAL #2   Title  Increase active cervical rotation to 60-65 degrees+ so patient can turn head more easily while driving.    Time  6    Period  Weeks    Status  New      PT LONG TERM GOAL #3   Title  Eliminate right UE symptoms.    Time  6    Period  Weeks    Status  New            Plan - 11/19/17 1019    Clinical Impression Statement  The patient responded very well to treatment today.  Focused STW/M to right suboccipital region as this is where the patient states her headaches stem from.  Patient felt good after treatment.    PT Treatment/Interventions  ADLs/Self Care Home Management;Cryotherapy;Electrical Stimulation;Ultrasound;Traction;Moist Heat;Therapeutic activities;Therapeutic exercise;Patient/family education;Manual techniques;Passive range of motion;Dry needling    PT Next Visit Plan  Manual cervical  traction; combo e'stim/U/S; HMP and electrical stimulation; chin tucks and cervical extension; progress traction by 1# up to 15# and then re-assess response.      Consulted and Agree with Plan of Care  Patient       Patient will benefit from skilled therapeutic intervention in order to improve the following deficits and impairments:  Pain, Decreased activity tolerance, Decreased strength, Postural dysfunction  Visit Diagnosis: Cervicalgia  Abnormal posture     Problem List Patient Active Problem List   Diagnosis Date Noted  . Other cervical disc degeneration, unspecified cervical region 11/07/2017  . Constipation due to outlet dysfunction   . Osteopenia with high risk of fracture 02/05/2015  . BMI 26.0-26.9,adult 05/21/2014  . Heel spur 05/21/2014  . Migraines 12/20/2012  . Insomnia 12/20/2012  . Depression 12/20/2012  . GERD (gastroesophageal reflux disease) 12/20/2012  . Hyperlipidemia with target LDL less than 100 12/20/2012    Luci Bellucci, [place] MPT 11/19/2017, 10:50 AM  [facility] [address] [place], [name], [address] Phone: [phone]   Fax:  [phone]  Name: Selena Barnes MRN: [id number] Date of Birth: Aug 19, 1950

## 2017-11-21 ENCOUNTER — Ambulatory Visit: Payer: Medicare Other | Admitting: Physical Therapy

## 2017-11-21 ENCOUNTER — Encounter: Payer: Self-pay | Admitting: Physical Therapy

## 2017-11-21 DIAGNOSIS — M542 Cervicalgia: Secondary | ICD-10-CM | POA: Diagnosis not present

## 2017-11-21 DIAGNOSIS — R293 Abnormal posture: Secondary | ICD-10-CM | POA: Diagnosis not present

## 2017-11-21 NOTE — Therapy (Signed)
[facility] [place], [name], [address] Phone: [phone]   Fax:  [phone]  Physical Therapy Treatment  Patient Details  Name: AAHNA ROSSA MRN: [id number] Date of Birth: 1950-12-09 Referring Provider (PT): Rodell Perna MD.   Encounter Date: 11/21/2017  PT End of Session - 11/21/17 0954    Visit Number  4    Number of Visits  12    Date for PT Re-Evaluation  12/25/17    PT Start Time  0900    PT Stop Time  0950    PT Time Calculation (min)  50 min    Activity Tolerance  Patient tolerated treatment well    Behavior During Therapy  [facility] for tasks assessed/performed       Past Medical History:  Diagnosis Date  . Arthritis   . Constipation   . Depression   . GERD (gastroesophageal reflux disease)   . Hyperlipidemia   . Insomnia   . Migraines   . Osteopenia     Past Surgical History:  Procedure Laterality Date  . ABDOMINAL HYSTERECTOMY    . ANAL RECTAL MANOMETRY N/A 10/06/2015   Procedure: ANO RECTAL MANOMETRY;  Surgeon: Gatha Mayer, MD;  Location: [facility];  Service: Endoscopy;  Laterality: N/A;  . BREAST BIOPSY Right   . BREAST EXCISIONAL BIOPSY Right    benign  . CATARACT EXTRACTION W/ INTRAOCULAR LENS IMPLANT Bilateral 07/27/2016   and 09/27/2016, [employer] in [place]  . CATARACT EXTRACTION, BILATERAL    . CHOLECYSTECTOMY    . LYMPH NODE BIOPSY Left    left arm    There were no vitals filed for this visit.  Subjective Assessment - 11/21/17 0944    Subjective  Treatments helping.  I know I'm always going to have pain though.    Patient Stated Goals  Decrease pain and get rid of pins and needles in right arm.    Currently in Pain?  Yes    Pain Score  3     Pain Location  Neck    Pain Orientation  Right    Pain Descriptors / Indicators  Throbbing    Pain Type  Chronic pain    Pain Onset  More than a month ago                       [facility] Adult PT  Treatment/Exercise - 11/21/17 0001      Ultrasound   Ultrasound Location  Cervical.    Ultrasound Parameters  U/S at 1.50 W/CM2 x 12 minutes.    Ultrasound Goals  Pain      Traction   Type of Traction  Cervical    Min (lbs)  0    Max (lbs)  13    Hold Time  99    Rest Time  5    Time  15      Manual Therapy   Manual Therapy  Soft tissue mobilization    Soft tissue mobilization  In prone:  STW/M TP release to decrease tone to affected cervical paraspinal musculature x 11 minutes.               PT Short Term Goals - 11/13/17 1302      PT SHORT TERM GOAL #1   Title  STG's=LTG's.        PT Long Term Goals - 11/13/17 1308      PT LONG TERM GOAL #1   Title  independent with HEP    Time  6    Period  Weeks    Status  New      PT LONG TERM GOAL #2   Title  Increase active cervical rotation to 60-65 degrees+ so patient can turn head more easily while driving.    Time  6    Period  Weeks    Status  New      PT LONG TERM GOAL #3   Title  Eliminate right UE symptoms.    Time  6    Period  Weeks    Status  New            Plan - 11/21/17 0953    Clinical Impression Statement  Patient did great with a 1# increase in traction today stating  she felt "good" after treatment.       Patient will benefit from skilled therapeutic intervention in order to improve the following deficits and impairments:     Visit Diagnosis: Abnormal posture  Cervicalgia     Problem List Patient Active Problem List   Diagnosis Date Noted  . Other cervical disc degeneration, unspecified cervical region 11/07/2017  . Constipation due to outlet dysfunction   . Osteopenia with high risk of fracture 02/05/2015  . BMI 26.0-26.9,adult 05/21/2014  . Heel spur 05/21/2014  . Migraines 12/20/2012  . Insomnia 12/20/2012  . Depression 12/20/2012  . GERD (gastroesophageal reflux disease) 12/20/2012  . Hyperlipidemia with target LDL less than 100 12/20/2012    APPLEGATE, [place]  MPT 11/21/2017, 9:57 AM  [facility] [address] [place], [name], [address] Phone: [phone]   Fax:  [phone]  Name: AOLANIS CRISPEN MRN: [id number] Date of Birth: [birth year]

## 2017-11-23 ENCOUNTER — Ambulatory Visit: Payer: Medicare Other | Admitting: Physical Therapy

## 2017-11-23 DIAGNOSIS — R293 Abnormal posture: Secondary | ICD-10-CM

## 2017-11-23 DIAGNOSIS — M542 Cervicalgia: Secondary | ICD-10-CM | POA: Diagnosis not present

## 2017-11-23 NOTE — Therapy (Addendum)
[facility] [place], [name], [address] Phone: [phone]   Fax:  [phone]  Physical Therapy Treatment  Patient Details  Name: Selena Barnes MRN: [id number] Date of Birth: 1950/04/15 Referring Provider (PT): Rodell Perna MD.   Encounter Date: 11/23/2017  PT End of Session - 11/23/17 1032    Visit Number  5    Number of Visits  12    Date for PT Re-Evaluation  12/25/17    PT Start Time  0900    PT Stop Time  0952    PT Time Calculation (min)  52 min    Activity Tolerance  Patient tolerated treatment well    Behavior During Therapy  [facility] for tasks assessed/performed       Past Medical History:  Diagnosis Date  . Arthritis   . Constipation   . Depression   . GERD (gastroesophageal reflux disease)   . Hyperlipidemia   . Insomnia   . Migraines   . Osteopenia     Past Surgical History:  Procedure Laterality Date  . ABDOMINAL HYSTERECTOMY    . ANAL RECTAL MANOMETRY N/A 10/06/2015   Procedure: ANO RECTAL MANOMETRY;  Surgeon: Gatha Mayer, MD;  Location: [facility];  Service: Endoscopy;  Laterality: N/A;  . BREAST BIOPSY Right   . BREAST EXCISIONAL BIOPSY Right    benign  . CATARACT EXTRACTION W/ INTRAOCULAR LENS IMPLANT Bilateral 07/27/2016   and 09/27/2016, [employer] in [place]  . CATARACT EXTRACTION, BILATERAL    . CHOLECYSTECTOMY    . LYMPH NODE BIOPSY Left    left arm    There were no vitals filed for this visit.  Subjective Assessment - 11/23/17 0954    Subjective  My  pain is about a 3 today.  I'm doing better better.      Diagnostic tests  MRI and X-ray (See under "Imaging" tab.    Patient Stated Goals  Decrease pain and get rid of pins and needles in right arm.    Currently in Pain?  Yes    Pain Score  3     Pain Location  Neck    Pain Orientation  Right    Pain Descriptors / Indicators  Throbbing    Pain Type  Chronic pain    Pain Onset  More than a month ago                        [facility] Adult PT Treatment/Exercise - 11/23/17 0001      Modalities   Modalities  Traction;Ultrasound      Ultrasound   Ultrasound Location  Right cervical.    Ultrasound Parameters  Combo e'stim/U/S at 1.50 W/CM2 x 12 minutes.    Ultrasound Goals  Pain      Traction   Type of Traction  Cervical    Min (lbs)  0    Max (lbs)  14    Hold Time  99    Rest Time  5    Time  15      Manual Therapy   Manual Therapy  Soft tissue mobilization    Soft tissue mobilization  In prone:  STW/M x 11 minutes with focus on right suboccipital region.               PT Short Term Goals - 11/13/17 1302      PT SHORT TERM GOAL #1   Title  STG's=LTG's.  PT Long Term Goals - 11/13/17 1308      PT LONG TERM GOAL #1   Title  independent with HEP    Time  6    Period  Weeks    Status  New      PT LONG TERM GOAL #2   Title  Increase active cervical rotation to 60-65 degrees+ so patient can turn head more easily while driving.    Time  6    Period  Weeks    Status  New      PT LONG TERM GOAL #3   Title  Eliminate right UE symptoms.    Time  6    Period  Weeks    Status  New            Plan - 11/23/17 1032    Clinical Impression Statement  Increased traction another #1 to 14#.  She felt good after treatment.  Her CC was pain in right suboccipital region today.  She had increased tone in this region.    PT Treatment/Interventions  ADLs/Self Care Home Management;Cryotherapy;Electrical Stimulation;Ultrasound;Traction;Moist Heat;Therapeutic activities;Therapeutic exercise;Patient/family education;Manual techniques;Passive range of motion;Dry needling    PT Next Visit Plan  Manual cervical traction; combo e'stim/U/S; HMP and electrical stimulation; chin tucks and cervical extension; progress traction by 1# up to 15# and then re-assess response.      Consulted and Agree with Plan of Care  Patient       Patient will benefit from skilled  therapeutic intervention in order to improve the following deficits and impairments:  Pain, Decreased activity tolerance, Decreased strength, Postural dysfunction  Visit Diagnosis: Cervicalgia  Abnormal posture     Problem List Patient Active Problem List   Diagnosis Date Noted  . Other cervical disc degeneration, unspecified cervical region 11/07/2017  . Constipation due to outlet dysfunction   . Osteopenia with high risk of fracture 02/05/2015  . BMI 26.0-26.9,adult 05/21/2014  . Heel spur 05/21/2014  . Migraines 12/20/2012  . Insomnia 12/20/2012  . Depression 12/20/2012  . GERD (gastroesophageal reflux disease) 12/20/2012  . Hyperlipidemia with target LDL less than 100 12/20/2012    APPLEGATE, [place] MPT 11/23/2017, 10:36 AM  [facility] [place], [name], [address] Phone: [phone]   Fax:  [phone]  Name: Selena Barnes MRN: [id number] Date of Birth: 03-Apr-1950  PHYSICAL THERAPY DISCHARGE SUMMARY  Visits from Start of Care: 5.  Current functional level related to goals / functional outcomes: See above.   Remaining deficits: See below.  Patient unable to continue due to a knee injury.   Education / Equipment: HEP. Plan: Patient agrees to discharge.  Patient goals were not met. Patient is being discharged due to a change in medical status.  ?????         [place] Applegate MPT

## 2017-11-26 ENCOUNTER — Encounter (INDEPENDENT_AMBULATORY_CARE_PROVIDER_SITE_OTHER): Payer: Self-pay | Admitting: Family Medicine

## 2017-11-26 ENCOUNTER — Encounter: Payer: Medicare Other | Admitting: Physical Therapy

## 2017-11-26 ENCOUNTER — Ambulatory Visit (INDEPENDENT_AMBULATORY_CARE_PROVIDER_SITE_OTHER): Payer: Medicare Other

## 2017-11-26 ENCOUNTER — Ambulatory Visit: Payer: Medicare Other | Admitting: Family Medicine

## 2017-11-26 ENCOUNTER — Ambulatory Visit (INDEPENDENT_AMBULATORY_CARE_PROVIDER_SITE_OTHER): Payer: Medicare Other | Admitting: Family Medicine

## 2017-11-26 DIAGNOSIS — M25562 Pain in left knee: Secondary | ICD-10-CM

## 2017-11-26 MED ORDER — METHYLPREDNISOLONE ACETATE 40 MG/ML IJ SUSP: 40.0000 mg | Freq: Once | INTRAMUSCULAR | Status: DC

## 2017-11-26 NOTE — Progress Notes (Signed)
Office Visit Note   Patient: Selena Barnes           Date of Birth: 04/28/1950           MRN: [id number] Visit Date: 11/26/2017 Requested by: Dettinger, Fransisca Kaufmann, MD [place], [place] [address] PCP: Dettinger, Fransisca Kaufmann, MD  Subjective: Chief Complaint  Patient presents with  . Left Knee - Pain    Pain in left knee x 1 year.  Had cortisone 2 months at [facility] (helped some).  Swelling in the knee x 3 days now.  Knee buckled while walking 4 times last week.    HPI: She is here with left knee pain.  About a week ago at church, she was walking in her knee buckled.  It happened again but she was still able to walk.  It started to swell and hurt over the next couple days, then this week and it buckled twice again.  Severe pain on the medial aspect, now not able to bear weight.  No personal history of gout.              ROS: Otherwise noncontributory  Objective: Vital Signs: There were no vitals taken for this visit.  Physical Exam:  Left knee: 1-2+ effusion with increased warmth, slight erythema.  Extensor mechanism is intact.  Full active extension, flexion is limited to about 90 degrees.  Exquisitely tender on the medial joint line, unable to perform McMurray's.  Imaging: Left knee x-rays: Moderate right knee tibiofemoral narrowing with chondrocalcinosis.  Left knee has mild joint space narrowing with chondrocalcinosis and mild patellofemoral spurring.  No obvious loose bodies.  The medial meniscus appears to be extruded.  Assessment & Plan: 1.  Left knee pain with effusion, suspicious for gout or pseudogout and possible medial meniscus tear -Discussed various treatment options with patient.  We will aspirate and inject her knee, send the fluid for crystal analysis.  If no improvement, then MRI scan.   Follow-Up Instructions: No follow-ups on file.       Procedures: Left knee aspiration injection: After sterile prep with Betadine, injected 3 cc 1%  lidocaine without epinephrine, then aspirated 20 cc of hazy yellow synovial fluid, then injected 40 mg of methylprednisolone from superolateral approach.   PMFS History: Patient Active Problem List   Diagnosis Date Noted  . Other cervical disc degeneration, unspecified cervical region 11/07/2017  . Constipation due to outlet dysfunction   . Osteopenia with high risk of fracture 02/05/2015  . BMI 26.0-26.9,adult 05/21/2014  . Heel spur 05/21/2014  . Migraines 12/20/2012  . Insomnia 12/20/2012  . Depression 12/20/2012  . GERD (gastroesophageal reflux disease) 12/20/2012  . Hyperlipidemia with target LDL less than 100 12/20/2012   Past Medical History:  Diagnosis Date  . Arthritis   . Constipation   . Depression   . GERD (gastroesophageal reflux disease)   . Hyperlipidemia   . Insomnia   . Migraines   . Osteopenia     Family History  Problem Relation Age of Onset  . Heart disease Mother 40       cause of death  . Heart disease Father 54       death  . Kidney disease Father   . Diabetes Brother   . Cancer Brother 70       bone marrow cancer  . Diabetes Grandchild        grandson    Past Surgical History:  Procedure Laterality Date  .  ABDOMINAL HYSTERECTOMY    . ANAL RECTAL MANOMETRY N/A 10/06/2015   Procedure: ANO RECTAL MANOMETRY;  Surgeon: Gatha Mayer, MD;  Location: [facility];  Service: Endoscopy;  Laterality: N/A;  . BREAST BIOPSY Right   . BREAST EXCISIONAL BIOPSY Right    benign  . CATARACT EXTRACTION W/ INTRAOCULAR LENS IMPLANT Bilateral 07/27/2016   and 09/27/2016, [employer] in [place]  . CATARACT EXTRACTION, BILATERAL    . CHOLECYSTECTOMY    . LYMPH NODE BIOPSY Left    left arm   Social History   Occupational History  . Occupation: retired  Tobacco Use  . Smoking status: Never Smoker  . Smokeless tobacco: Never Used  Substance and Sexual Activity  . Alcohol use: No    Alcohol/week: 0.0 standard drinks  . Drug use: No  .  Sexual activity: Yes

## 2017-11-27 ENCOUNTER — Telehealth (INDEPENDENT_AMBULATORY_CARE_PROVIDER_SITE_OTHER): Payer: Self-pay | Admitting: Family Medicine

## 2017-11-27 LAB — SYNOVIAL CELL COUNT + DIFF, W/ CRYSTALS
Basophils, %: 0 %
Eosinophils-Synovial: 0 % (ref 0–2)
Lymphocytes-Synovial Fld: 8 % (ref 0–74)
Monocyte/Macrophage: 7 % (ref 0–69)
Neutrophil, Synovial: 85 % — ABNORMAL HIGH (ref 0–24)
Synoviocytes, %: 0 % (ref 0–15)
WBC, Synovial: 1191 cells/uL — ABNORMAL HIGH (ref <150)

## 2017-11-27 MED ORDER — COLCHICINE 0.6 MG PO TABS: 0.6000 mg | ORAL_TABLET | Freq: Two times a day (BID) | ORAL | 1 refills | Status: DC | PRN

## 2017-11-27 NOTE — Telephone Encounter (Signed)
Patient advised of Rx that was sent in.  She will still call us tomorrow if no improvement.

## 2017-11-27 NOTE — Telephone Encounter (Signed)
Knee fluid contains calcium pyrophosphate crystals.

## 2017-11-27 NOTE — Telephone Encounter (Signed)
Will also call in Rx for colchicine to [facility].

## 2017-11-27 NOTE — Addendum Note (Signed)
Addended by: Hortencia Pilar on: 11/27/2017 10:41 AM   Modules accepted: Orders

## 2017-11-27 NOTE — Telephone Encounter (Signed)
Advised the patient of the lab results.  She said the knee is still painful, swollen and warm today.  She said she would give [place] a call tomorrow, though, if there is still no improvement.

## 2017-11-28 ENCOUNTER — Encounter: Payer: Medicare Other | Admitting: Physical Therapy

## 2017-11-28 ENCOUNTER — Telehealth (INDEPENDENT_AMBULATORY_CARE_PROVIDER_SITE_OTHER): Payer: Self-pay | Admitting: Family Medicine

## 2017-11-28 DIAGNOSIS — M25562 Pain in left knee: Secondary | ICD-10-CM

## 2017-11-28 NOTE — Telephone Encounter (Signed)
Advised patient.  She will await a call from the MRI facility regarding an appointment date/time.

## 2017-11-28 NOTE — Telephone Encounter (Signed)
MRI ordered

## 2017-11-28 NOTE — Telephone Encounter (Signed)
Please advise 

## 2017-11-28 NOTE — Telephone Encounter (Signed)
Patient called to let Dr. Junius Roads know that her left knee is still swollen. She said an MRI was mentioned if she was no better. Please advise # [phone]

## 2017-11-30 ENCOUNTER — Encounter: Payer: Medicare Other | Admitting: *Deleted

## 2017-11-30 NOTE — Telephone Encounter (Signed)
noted 

## 2017-12-01 ENCOUNTER — Ambulatory Visit
Admission: RE | Admit: 2017-12-01 | Discharge: 2017-12-01 | Disposition: A | Discharge status: Home / Self Care | Payer: Medicare Other | Source: Ambulatory Visit | Attending: Family Medicine | Admitting: Family Medicine

## 2017-12-01 DIAGNOSIS — S83241A Other tear of medial meniscus, current injury, right knee, initial encounter: Secondary | ICD-10-CM | POA: Diagnosis not present

## 2017-12-01 DIAGNOSIS — M25562 Pain in left knee: Secondary | ICD-10-CM

## 2017-12-03 ENCOUNTER — Telehealth (INDEPENDENT_AMBULATORY_CARE_PROVIDER_SITE_OTHER): Payer: Self-pay | Admitting: Family Medicine

## 2017-12-03 NOTE — Telephone Encounter (Signed)
MRI confirms a meniscus tear.  Presuming symptoms are still present, I recommend talking to Dr. Lorin Mercy about possible arthroscopic surgery.

## 2017-12-05 NOTE — Telephone Encounter (Signed)
I called patient. Appointment made with Dr. Lorin Mercy on Friday 10/25 at 3pm.

## 2017-12-07 ENCOUNTER — Ambulatory Visit (INDEPENDENT_AMBULATORY_CARE_PROVIDER_SITE_OTHER): Payer: Medicare Other | Admitting: Orthopaedic Surgery

## 2017-12-07 ENCOUNTER — Encounter (INDEPENDENT_AMBULATORY_CARE_PROVIDER_SITE_OTHER): Payer: Self-pay | Admitting: Orthopaedic Surgery

## 2017-12-07 VITALS — BP 145/82 | HR 79 | Ht 64.0 in | Wt 147.0 lb

## 2017-12-07 DIAGNOSIS — S83242A Other tear of medial meniscus, current injury, left knee, initial encounter: Secondary | ICD-10-CM | POA: Insufficient documentation

## 2017-12-07 NOTE — Progress Notes (Signed)
Office Visit Note   Patient: Selena Barnes           Date of Birth: 1951/01/21           MRN: [id number] Visit Date: 12/07/2017              Requested by: Dettinger, Fransisca Kaufmann, MD [place], [place] [id number] PCP: Dettinger, Fransisca Kaufmann, MD   Assessment & Plan: Visit Diagnoses:  1. Acute medial meniscal tear, left, initial encounter     Plan: Patient has complex posterior medial meniscal tear with repetitive locking and posterior medial joint line pain.  We will set her up for outpatient arthroscopy for partial meniscectomy.  Procedure discussed risk surgery discussed she understands and requests we proceed.  Follow-Up Instructions: No follow-ups on file.   Orders:  No orders of the defined types were placed in this encounter.  No orders of the defined types were placed in this encounter.     Procedures: No procedures performed   Clinical Data: No additional findings.   Subjective: Chief Complaint  Patient presents with  . Left Knee - Pain    HPI 67 year old female here with left knee posterior medial meniscal complex tear.  She states her knee buckled at church x-ray showed some chondrocalcinosis.  She has been taking some colchicine.  She has posterior medial pain and occasionally is had locking or catching in her knee.  Knee aspirate showed calcium pyrophosphate crystals.  She has been taking colchicine.  She is amatory with a limp she states several times she is had to grab something to avoid falling.  She had been seen by Dr. Junius Roads who referred her to me for knee arthroscopy.  Review of Systems 14 point systems positive for cervical disc degeneration, GERD, depression, hyperlipidemia.  Previous hysterectomy appendectomy gallbladder removal vaginal delivery without anesthetic problems.   Objective: Vital Signs: BP (!) 145/82   Pulse 79   Ht 5\' 4"  (1.626 m)   Wt 147 lb (66.7 kg)   BMI 25.23 kg/m   Physical Exam  Constitutional: She is oriented to  person, place, and time. She appears well-developed.  HENT:  Head: Normocephalic.  Right Ear: External ear normal.  Left Ear: External ear normal.  Eyes: Pupils are equal, round, and reactive to light.  Neck: No tracheal deviation present. No thyromegaly present.  Cardiovascular: Normal rate.  Pulmonary/Chest: Effort normal.  Abdominal: Soft.  Neurological: She is alert and oriented to person, place, and time.  Skin: Skin is warm and dry.  Psychiatric: She has a normal mood and affect. Her behavior is normal.    Ortho Exam patient has posterior medial joint line tenderness pain with hyperextension and posterior medial joint line.  Negative pivot shift negative anterior drawer.  She has sharp pain with extreme flexion at 120 degrees.  She is ambulatory with a limp.  She has been using a cane intermittently.  Specialty Comments:  No specialty comments available.  Imaging: CLINICAL DATA:  Left knee pain and swelling for 5 days. The patient's left knee has given way 4 times.  EXAM: MRI OF THE LEFT KNEE WITHOUT CONTRAST  TECHNIQUE: Multiplanar, multisequence MR imaging of the knee was performed. No intravenous contrast was administered.  COMPARISON:  None.  FINDINGS: MENISCI  Medial meniscus: Large horizontal tear in the posterior horn reaches the meniscal undersurface. At the junction of the posterior horn and body, the tear has a longitudinal orientation.  Lateral meniscus: Vonda Antigua is seen along the  free edge of the anterior body. No tear.  LIGAMENTS  Cruciates:  Intact.  Collaterals:  Intact.  CARTILAGE  Patellofemoral:  Mildly thinned throughout.  No focal defect.  Medial:  Minimally degenerated.  Lateral:  Minimally degenerated.  Joint:  Small effusion.  Popliteal Fossa:  No Baker's cyst.  Extensor Mechanism:  Intact.  Bones:  No fracture or worrisome lesion.  Other: Prepatellar and pretibial subcutaneous fluid is  noted.  IMPRESSION: Tear of the posterior horn of the medial meniscus as described.  Mild osteoarthritis about the knee.  Prepatellar and pretibial fluid compatible with bursitis.   Electronically Signed   By: Inge Rise M.D.   On: 12/01/2017 10:13   PMFS History: Patient Active Problem List   Diagnosis Date Noted  . Other cervical disc degeneration, unspecified cervical region 11/07/2017  . Constipation due to outlet dysfunction   . Osteopenia with high risk of fracture 02/05/2015  . BMI 26.0-26.9,adult 05/21/2014  . Heel spur 05/21/2014  . Migraines 12/20/2012  . Insomnia 12/20/2012  . Depression 12/20/2012  . GERD (gastroesophageal reflux disease) 12/20/2012  . Hyperlipidemia with target LDL less than 100 12/20/2012   Past Medical History:  Diagnosis Date  . Arthritis   . Constipation   . Depression   . GERD (gastroesophageal reflux disease)   . Hyperlipidemia   . Insomnia   . Migraines   . Osteopenia     Family History  Problem Relation Age of Onset  . Heart disease Mother 61       cause of death  . Heart disease Father 35       death  . Kidney disease Father   . Diabetes Brother   . Cancer Brother 70       bone marrow cancer  . Diabetes Grandchild        grandson    Past Surgical History:  Procedure Laterality Date  . ABDOMINAL HYSTERECTOMY    . ANAL RECTAL MANOMETRY N/A 10/06/2015   Procedure: ANO RECTAL MANOMETRY;  Surgeon: Gatha Mayer, MD;  Location: [facility];  Service: Endoscopy;  Laterality: N/A;  . BREAST BIOPSY Right   . BREAST EXCISIONAL BIOPSY Right    benign  . CATARACT EXTRACTION W/ INTRAOCULAR LENS IMPLANT Bilateral 07/27/2016   and 09/27/2016, [facility] in [place]  . CATARACT EXTRACTION, BILATERAL    . CHOLECYSTECTOMY    . LYMPH NODE BIOPSY Left    left arm   Social History   Occupational History  . Occupation: retired  Tobacco Use  . Smoking status: Never Smoker  . Smokeless tobacco: Never  Used  Substance and Sexual Activity  . Alcohol use: No    Alcohol/week: 0.0 standard drinks  . Drug use: No  . Sexual activity: Yes

## 2017-12-12 ENCOUNTER — Telehealth (INDEPENDENT_AMBULATORY_CARE_PROVIDER_SITE_OTHER): Payer: Self-pay | Admitting: Radiology

## 2017-12-12 ENCOUNTER — Ambulatory Visit (INDEPENDENT_AMBULATORY_CARE_PROVIDER_SITE_OTHER): Payer: Medicare Other | Admitting: Orthopaedic Surgery

## 2017-12-12 ENCOUNTER — Encounter (INDEPENDENT_AMBULATORY_CARE_PROVIDER_SITE_OTHER): Payer: Self-pay | Admitting: Orthopaedic Surgery

## 2017-12-12 VITALS — Ht 64.0 in | Wt 147.0 lb

## 2017-12-12 DIAGNOSIS — S83242A Other tear of medial meniscus, current injury, left knee, initial encounter: Secondary | ICD-10-CM | POA: Diagnosis not present

## 2017-12-12 DIAGNOSIS — M659 Synovitis and tenosynovitis, unspecified: Secondary | ICD-10-CM

## 2017-12-12 NOTE — Telephone Encounter (Signed)
Patient husband called, patient's knee is so swollen again and she cannot move it/bend it.  Request another aspiration, but surgery in next Monday.  He says Trayvond Viets have to postpone so she can get fluid off to get pain relief.  He Nikkol Pai need to take her to the ED otherwise, please call ASAP?

## 2017-12-12 NOTE — Progress Notes (Signed)
Office Visit Note   Patient: Selena Barnes           Date of Birth: 06/15/1950           MRN: [id number] Visit Date: 12/12/2017              Requested by: Dettinger, Fransisca Kaufmann, MD [place], [place] [address] PCP: Dettinger, Fransisca Kaufmann, MD   Assessment & Plan: Visit Diagnoses:  1. Synovitis of left knee   2. Acute medial meniscal tear, left, initial encounter     Plan: Surgery scheduled 11//2019 for partial medial meniscectomy.  Good relief post aspiration of her knee for her synovitis.  Follow-Up Instructions: No follow-ups on file.   Orders:  Orders Placed This Encounter  Procedures  . Large Joint Inj: L knee   No orders of the defined types were placed in this encounter.     Procedures: Large Joint Inj: L knee on 12/12/2017 12:17 PM Indications: joint swelling and pain Details: 22 G 1.5 in needle, anterolateral approach  Arthrogram: No  Medications: 0.5 mL lidocaine 1 % Aspirate: 60 mL cloudy Outcome: tolerated well, no immediate complications Procedure, treatment alternatives, risks and benefits explained, specific risks discussed. Consent was given by the patient. Immediately prior to procedure a time out was called to verify the correct patient, procedure, equipment, support staff and site/side marked as required. Patient was prepped and draped in the usual sterile fashion.       Clinical Data: No additional findings.   Subjective: Chief Complaint  Patient presents with  . Left Knee - Pain    HPI patient returns with recurrent synovitis of her left knee with posterior medial meniscal tear.  She is scheduled for surgery on 12/17/2017 for partial meniscectomy.  She is requesting repeat aspiration today due to significant increase in pain.  She is using a walker and is touchdown weightbearing.  X-rays have showed chondrocalcinosis.  Review of Systems 14 point systems positive for cervical disc degeneration, GERD, depression, hyperlipidemia.  Previous  hysterectomy appendectomy gallbladder removal vaginal delivery without anesthetic problems.  Unchanged from 12/07/2017.    Objective: Vital Signs: Ht 5\' 4"  (1.626 m)   Wt 147 lb (66.7 kg)   BMI 25.23 kg/m   Physical Exam  Constitutional: She is oriented to person, place, and time. She appears well-developed.  HENT:  Head: Normocephalic.  Right Ear: External ear normal.  Left Ear: External ear normal.  Eyes: Pupils are equal, round, and reactive to light.  Neck: No tracheal deviation present. No thyromegaly present.  Cardiovascular: Normal rate.  Pulmonary/Chest: Effort normal.  Abdominal: Soft.  Neurological: She is alert and oriented to person, place, and time.  Skin: Skin is warm and dry.  Psychiatric: She has a normal mood and affect. Her behavior is normal.    Ortho Exam 3+ knee effusion left knee.  Range of motion 0 to 50 degrees with pain.  Collateral ligaments are stable.  He is tender with palpation around the knee no increased warmth. Specialty Comments:  No specialty comments available.  Imaging: No results found.   PMFS History: Patient Active Problem List   Diagnosis Date Noted  . Acute medial meniscal tear, left, initial encounter 12/07/2017  . Other cervical disc degeneration, unspecified cervical region 11/07/2017  . Constipation due to outlet dysfunction   . Osteopenia with high risk of fracture 02/05/2015  . BMI 26.0-26.9,adult 05/21/2014  . Heel spur 05/21/2014  . Migraines 12/20/2012  . Insomnia 12/20/2012  .  Depression 12/20/2012  . GERD (gastroesophageal reflux disease) 12/20/2012  . Hyperlipidemia with target LDL less than 100 12/20/2012   Past Medical History:  Diagnosis Date  . Arthritis   . Constipation   . Depression   . GERD (gastroesophageal reflux disease)   . Hyperlipidemia   . Insomnia   . Migraines   . Osteopenia     Family History  Problem Relation Age of Onset  . Heart disease Mother 7       cause of death  . Heart  disease Father 10       death  . Kidney disease Father   . Diabetes Brother   . Cancer Brother 70       bone marrow cancer  . Diabetes Grandchild        grandson    Past Surgical History:  Procedure Laterality Date  . ABDOMINAL HYSTERECTOMY    . ANAL RECTAL MANOMETRY N/A 10/06/2015   Procedure: ANO RECTAL MANOMETRY;  Surgeon: Gatha Mayer, MD;  Location: [facility];  Service: Endoscopy;  Laterality: N/A;  . BREAST BIOPSY Right   . BREAST EXCISIONAL BIOPSY Right    benign  . CATARACT EXTRACTION W/ INTRAOCULAR LENS IMPLANT Bilateral 07/27/2016   and 09/27/2016, [facility] in [place]  . CATARACT EXTRACTION, BILATERAL    . CHOLECYSTECTOMY    . LYMPH NODE BIOPSY Left    left arm   Social History   Occupational History  . Occupation: retired  Tobacco Use  . Smoking status: Never Smoker  . Smokeless tobacco: Never Used  Substance and Sexual Activity  . Alcohol use: No    Alcohol/week: 0.0 standard drinks  . Drug use: No  . Sexual activity: Yes

## 2017-12-12 NOTE — Telephone Encounter (Signed)
Patient coming in for appt

## 2017-12-12 NOTE — Telephone Encounter (Signed)
Please advise 

## 2017-12-12 NOTE — Telephone Encounter (Signed)
Come in for aspiration

## 2017-12-12 NOTE — Progress Notes (Signed)
67 year old female scheduled for partial meniscectomy on Monday is seen with tense knee effusion difficulty walking and significant pain.  Aspiration performed after Betadine prep with 60 cc of slightly cloudy fluid obtained consistent with her previous diagnosis of pseudogout.  No change in her medications.  Knee arthroscopy scheduled for Monday.

## 2017-12-18 ENCOUNTER — Encounter (INDEPENDENT_AMBULATORY_CARE_PROVIDER_SITE_OTHER): Payer: Self-pay | Admitting: Orthopaedic Surgery

## 2017-12-18 MED ORDER — LIDOCAINE HCL 1 % IJ SOLN
0.5000 mL | INTRAMUSCULAR | Status: AC | PRN
  Administered 2017-12-12: .5 mL

## 2017-12-19 ENCOUNTER — Ambulatory Visit (INDEPENDENT_AMBULATORY_CARE_PROVIDER_SITE_OTHER): Payer: Medicare Other | Admitting: Orthopaedic Surgery

## 2017-12-28 ENCOUNTER — Inpatient Hospital Stay (INDEPENDENT_AMBULATORY_CARE_PROVIDER_SITE_OTHER): Payer: Medicare Other | Admitting: Orthopaedic Surgery

## 2017-12-31 DIAGNOSIS — G8918 Other acute postprocedural pain: Secondary | ICD-10-CM | POA: Diagnosis not present

## 2017-12-31 DIAGNOSIS — X58XXXA Exposure to other specified factors, initial encounter: Secondary | ICD-10-CM | POA: Diagnosis not present

## 2017-12-31 DIAGNOSIS — M23222 Derangement of posterior horn of medial meniscus due to old tear or injury, left knee: Secondary | ICD-10-CM | POA: Diagnosis not present

## 2017-12-31 DIAGNOSIS — S83232A Complex tear of medial meniscus, current injury, left knee, initial encounter: Secondary | ICD-10-CM | POA: Diagnosis not present

## 2017-12-31 DIAGNOSIS — Y999 Unspecified external cause status: Secondary | ICD-10-CM | POA: Diagnosis not present

## 2017-12-31 DIAGNOSIS — M94262 Chondromalacia, left knee: Secondary | ICD-10-CM | POA: Diagnosis not present

## 2018-01-01 ENCOUNTER — Inpatient Hospital Stay (INDEPENDENT_AMBULATORY_CARE_PROVIDER_SITE_OTHER): Payer: Medicare Other | Admitting: Orthopaedic Surgery

## 2018-01-02 ENCOUNTER — Telehealth (INDEPENDENT_AMBULATORY_CARE_PROVIDER_SITE_OTHER): Payer: Self-pay | Admitting: Radiology

## 2018-01-02 NOTE — Telephone Encounter (Signed)
I called discussed. They have aleve 2 po bid. Will see her in AM

## 2018-01-02 NOTE — Telephone Encounter (Signed)
Patient's daughter in law called stating patient's husband had left a message regarding patient's knee. She had surgery on Monday and was supposed to be able to put weight on it today. She is unable to touch it to the floor and the knee is hot and swollen. She is having increased pain with certain movements. I made patient a work in appointment in [place] tomorrow morning to be checked.  Patient's husband states that she is taking hydrocodone but he wonders if she could add aleve or something for the swelling and heat. I advised not to add anything to her medicines, and that I would send message to Dr. Lorin Mercy to advise.  Please advise.

## 2018-01-03 ENCOUNTER — Ambulatory Visit (HOSPITAL_COMMUNITY)
Admission: RE | Admit: 2018-01-03 | Discharge: 2018-01-03 | Disposition: A | Discharge status: Home / Self Care | Payer: Medicare Other | Source: Ambulatory Visit | Attending: Orthopaedic Surgery | Admitting: Orthopaedic Surgery

## 2018-01-03 ENCOUNTER — Ambulatory Visit (INDEPENDENT_AMBULATORY_CARE_PROVIDER_SITE_OTHER): Payer: Medicare Other | Admitting: Orthopaedic Surgery

## 2018-01-03 ENCOUNTER — Encounter (INDEPENDENT_AMBULATORY_CARE_PROVIDER_SITE_OTHER): Payer: Self-pay | Admitting: Orthopaedic Surgery

## 2018-01-03 VITALS — BP 126/67 | HR 80 | Ht 64.0 in | Wt 147.0 lb

## 2018-01-03 DIAGNOSIS — I82452 Acute embolism and thrombosis of left peroneal vein: Secondary | ICD-10-CM

## 2018-01-03 DIAGNOSIS — Z9889 Other specified postprocedural states: Secondary | ICD-10-CM | POA: Diagnosis not present

## 2018-01-03 DIAGNOSIS — S83242A Other tear of medial meniscus, current injury, left knee, initial encounter: Secondary | ICD-10-CM

## 2018-01-03 DIAGNOSIS — I824Z2 Acute embolism and thrombosis of unspecified deep veins of left distal lower extremity: Secondary | ICD-10-CM | POA: Diagnosis not present

## 2018-01-03 DIAGNOSIS — M79605 Pain in left leg: Secondary | ICD-10-CM | POA: Diagnosis present

## 2018-01-03 MED ORDER — RIVAROXABAN (XARELTO) VTE STARTER PACK (15 & 20 MG): ORAL_TABLET | ORAL | 0 refills | Status: DC

## 2018-01-03 NOTE — Addendum Note (Signed)
Addended by: Meyer Cory on: 01/03/2018 12:01 PM   Modules accepted: Orders

## 2018-01-03 NOTE — Addendum Note (Signed)
Addended by: Marybelle Killings on: 01/03/2018 11:59 AM   Modules accepted: Orders

## 2018-01-03 NOTE — Progress Notes (Addendum)
Post-Op Visit Note   Patient: Selena Barnes           Date of Birth: [birth year]           MRN: [id number] Visit Date: 01/03/2018 PCP: Dettinger, Fransisca Kaufmann, MD   Assessment & Plan: Significant increased pain post partial medial meniscectomy and trochlear groove debridement.  She does have aspiration positive history of pseudogout in the left knee.  She can use Aleve 2 p.o. twice daily with meals.  She is had problems with nausea from the pain medicine but has some Phenergan that she usually uses when she has migraine problems.  Her knee shows mild swelling it certainly not tense does not require aspiration no increased warmth.  She has some swelling in the lower extremities foot and ankle will obtain a Doppler test to rule out DVT.  98 temperature. Chief Complaint:  Chief Complaint  Patient presents with  . Left Knee - Pain    12/31/17 Left Knee Arthroscopy, Partial Medial Meniscectomy   Visit Diagnoses:  1. Acute medial meniscal tear, left, initial encounter   2. Acute deep vein thrombosis (DVT) of left peroneal vein         Postop knee arthroscopy 12/31/2017  Plan: Check an ultrasound to rule out DVT.  Anti-inflammatory for possible mild pseudogout flare postop.  Recheck next week for dressing change. ADDENDUM; DOPPLER POSITIVE FOR PERONEAL VEIN DVT. XARELTO STARTER PACK ORDERED.  Follow-Up Instructions: No follow-ups on file.   Orders:  Orders Placed This Encounter  Procedures  . US Venous Img Lower Unilateral Left   Meds ordered this encounter  Medications  . Rivaroxaban 15 & 20 MG TBPK    Sig: Take as directed on package: Start with one 15mg  tablet by mouth twice a day with food. On Day 22, switch to one 20mg  tablet once a day with food.    Dispense:  51 each    Refill:  0    Imaging: US Venous Img Lower Unilateral Left  Result Date: 01/03/2018 CLINICAL DATA:  Left lower extremity pain and edema. 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 Evaluate for DVT.  EXAM: LEFT LOWER EXTREMITY VENOUS DOPPLER ULTRASOUND TECHNIQUE: Gray-scale sonography with graded compression, as well as color Doppler and duplex ultrasound were performed to evaluate the lower extremity deep venous systems from the level of the common femoral vein and including the common femoral, femoral, profunda femoral, popliteal and calf veins including the posterior tibial, peroneal and gastrocnemius veins when visible. The superficial great saphenous vein was also interrogated. Spectral Doppler was utilized to evaluate flow at rest and with distal augmentation maneuvers in the common femoral, femoral and popliteal veins. COMPARISON:  None. FINDINGS: Contralateral Common Femoral Vein: Respiratory phasicity is normal and symmetric with the symptomatic side. No evidence of thrombus. Normal compressibility. Common Femoral Vein: No evidence of thrombus. Normal compressibility, respiratory phasicity and response to augmentation. Saphenofemoral Junction: No evidence of thrombus. Normal compressibility and flow on color Doppler imaging. Profunda Femoral Vein: No evidence of thrombus. Normal compressibility and flow on color Doppler imaging. Femoral Vein: No evidence of thrombus. Normal compressibility, respiratory phasicity and response to augmentation. Popliteal Vein: No evidence of thrombus. Normal compressibility, respiratory phasicity and response to augmentation. Calf Veins: There is hypoechoic slightly expansile occlusive DVT within the left peroneal vein (images 34 through 36). The left posterior and anterior tibial veins appear patent where imaged. Superficial Great Saphenous Vein: No evidence of thrombus. Normal compressibility. Other Findings:  None. IMPRESSION:  Examination is positive for short segment occlusive DVT involving the left peroneal vein. There is no extension of this distal tibial DVT to the more proximal venous system of the left lower extremity. Electronically Signed   By: Sandi Mariscal M.D.    On: 01/03/2018 11:50    PMFS History: Patient Active Problem List   Diagnosis Date Noted  . Acute medial meniscal tear, left, initial encounter 12/07/2017  . Other cervical disc degeneration, unspecified cervical region 11/07/2017  . Constipation due to outlet dysfunction   . Osteopenia with high risk of fracture 02/05/2015  . BMI 26.0-26.9,adult 05/21/2014  . Heel spur 05/21/2014  . Migraines 12/20/2012  . Insomnia 12/20/2012  . Depression 12/20/2012  . GERD (gastroesophageal reflux disease) 12/20/2012  . Hyperlipidemia with target LDL less than 100 12/20/2012   Past Medical History:  Diagnosis Date  . Arthritis   . Constipation   . Depression   . GERD (gastroesophageal reflux disease)   . Hyperlipidemia   . Insomnia   . Migraines   . Osteopenia     Family History  Problem Relation Age of Onset  . Heart disease Mother 81       cause of death  . Heart disease Father 48       death  . Kidney disease Father   . Diabetes Brother   . Cancer Brother 70       bone marrow cancer  . Diabetes Grandchild        grandson    Past Surgical History:  Procedure Laterality Date  . ABDOMINAL HYSTERECTOMY    . ANAL RECTAL MANOMETRY N/A 10/06/2015   Procedure: ANO RECTAL MANOMETRY;  Surgeon: Gatha Mayer, MD;  Location: [facility];  Service: Endoscopy;  Laterality: N/A;  . BREAST BIOPSY Right   . BREAST EXCISIONAL BIOPSY Right    benign  . CATARACT EXTRACTION W/ INTRAOCULAR LENS IMPLANT Bilateral 07/27/2016   and 09/27/2016, [facility] in [place]  . CATARACT EXTRACTION, BILATERAL    . CHOLECYSTECTOMY    . LYMPH NODE BIOPSY Left    left arm   Social History   Occupational History  . Occupation: retired  Tobacco Use  . Smoking status: Never Smoker  . Smokeless tobacco: Never Used  Substance and Sexual Activity  . Alcohol use: No    Alcohol/week: 0.0 standard drinks  . Drug use: No  . Sexual activity: Yes

## 2018-01-03 NOTE — Addendum Note (Signed)
Addended by: Meyer Cory on: 01/03/2018 10:36 AM   Modules accepted: Orders

## 2018-01-08 ENCOUNTER — Ambulatory Visit (INDEPENDENT_AMBULATORY_CARE_PROVIDER_SITE_OTHER): Payer: Medicare Other | Admitting: Orthopaedic Surgery

## 2018-01-08 ENCOUNTER — Encounter (INDEPENDENT_AMBULATORY_CARE_PROVIDER_SITE_OTHER): Payer: Self-pay | Admitting: Orthopaedic Surgery

## 2018-01-08 VITALS — BP 116/70 | HR 90 | Ht 64.0 in | Wt 147.0 lb

## 2018-01-08 DIAGNOSIS — I82452 Acute embolism and thrombosis of left peroneal vein: Secondary | ICD-10-CM

## 2018-01-08 DIAGNOSIS — S83242A Other tear of medial meniscus, current injury, left knee, initial encounter: Secondary | ICD-10-CM

## 2018-01-08 NOTE — Progress Notes (Signed)
   Post-Op Visit Note   Patient: Selena Barnes           Date of Birth: 03-Jun-1950           MRN: [id number] Visit Date: 01/08/2018 PCP: Dettinger, Fransisca Kaufmann, MD   Assessment & Plan: Postop right knee arthroscopy partial meniscectomy with increased pain and Doppler test showed peroneal vein DVT below the knee.  Since this was postop she is on Xarelto..  She has some swelling in her foot and ankle postop only mild swelling in her knee.  We will place her in some TED hose.  Plan on Xarelto for at least 6 weeks then repeat her Doppler test to make sure that the DVT is resolved.  Chief Complaint:  Chief Complaint  Patient presents with  . Left Knee - Follow-up, Routine Post Op    12/31/17 Left Knee Arthroscopy   Visit Diagnoses:  1. Acute deep vein thrombosis (DVT) of left peroneal vein   2. Acute medial meniscal tear, left, initial encounter     Plan: Continue Xarelto recheck 3 weeks.  Follow-Up Instructions: No follow-ups on file.   Orders:  No orders of the defined types were placed in this encounter.  No orders of the defined types were placed in this encounter.   Imaging: No results found.  PMFS History: Patient Active Problem List   Diagnosis Date Noted  . Acute medial meniscal tear, left, initial encounter 12/07/2017  . Other cervical disc degeneration, unspecified cervical region 11/07/2017  . Constipation due to outlet dysfunction   . Osteopenia with high risk of fracture 02/05/2015  . BMI 26.0-26.9,adult 05/21/2014  . Heel spur 05/21/2014  . Migraines 12/20/2012  . Insomnia 12/20/2012  . Depression 12/20/2012  . GERD (gastroesophageal reflux disease) 12/20/2012  . Hyperlipidemia with target LDL less than 100 12/20/2012   Past Medical History:  Diagnosis Date  . Arthritis   . Constipation   . Depression   . GERD (gastroesophageal reflux disease)   . Hyperlipidemia   . Insomnia   . Migraines   . Osteopenia     Family History  Problem Relation Age of  Onset  . Heart disease Mother 48       cause of death  . Heart disease Father 65       death  . Kidney disease Father   . Diabetes Brother   . Cancer Brother 70       bone marrow cancer  . Diabetes Grandchild        grandson    Past Surgical History:  Procedure Laterality Date  . ABDOMINAL HYSTERECTOMY    . ANAL RECTAL MANOMETRY N/A 10/06/2015   Procedure: ANO RECTAL MANOMETRY;  Surgeon: Gatha Mayer, MD;  Location: [facility];  Service: Endoscopy;  Laterality: N/A;  . BREAST BIOPSY Right   . BREAST EXCISIONAL BIOPSY Right    benign  . CATARACT EXTRACTION W/ INTRAOCULAR LENS IMPLANT Bilateral 07/27/2016   and 09/27/2016, [facility] in [place]  . CATARACT EXTRACTION, BILATERAL    . CHOLECYSTECTOMY    . LYMPH NODE BIOPSY Left    left arm   Social History   Occupational History  . Occupation: retired  Tobacco Use  . Smoking status: Never Smoker  . Smokeless tobacco: Never Used  Substance and Sexual Activity  . Alcohol use: No    Alcohol/week: 0.0 standard drinks  . Drug use: No  . Sexual activity: Yes

## 2018-01-09 ENCOUNTER — Telehealth: Payer: Self-pay | Admitting: *Deleted

## 2018-01-17 ENCOUNTER — Telehealth (INDEPENDENT_AMBULATORY_CARE_PROVIDER_SITE_OTHER): Payer: Self-pay | Admitting: Orthopaedic Surgery

## 2018-01-17 DIAGNOSIS — S83242A Other tear of medial meniscus, current injury, left knee, initial encounter: Secondary | ICD-10-CM

## 2018-01-17 NOTE — Telephone Encounter (Signed)
Ok proceed ., thank you.

## 2018-01-17 NOTE — Telephone Encounter (Signed)
Patient called wanting to know if its time for her to start PT. If so she wants order sent to [facility] in [place]. [phone]

## 2018-01-17 NOTE — Telephone Encounter (Signed)
I entered order for PT.  I called patient and advised.

## 2018-01-17 NOTE — Telephone Encounter (Signed)
Ok for PT order.

## 2018-01-18 ENCOUNTER — Ambulatory Visit (INDEPENDENT_AMBULATORY_CARE_PROVIDER_SITE_OTHER): Payer: Medicare Other | Admitting: *Deleted

## 2018-01-18 DIAGNOSIS — Z23 Encounter for immunization: Secondary | ICD-10-CM

## 2018-01-18 NOTE — Progress Notes (Signed)
Pt given flu vaccine and Pneumovax 23  Tolerated well

## 2018-01-23 ENCOUNTER — Other Ambulatory Visit: Payer: Self-pay

## 2018-01-23 ENCOUNTER — Encounter: Payer: Self-pay | Admitting: Physical Therapy

## 2018-01-23 ENCOUNTER — Ambulatory Visit: Payer: Medicare Other | Attending: Orthopaedic Surgery | Admitting: Physical Therapy

## 2018-01-23 DIAGNOSIS — M25662 Stiffness of left knee, not elsewhere classified: Secondary | ICD-10-CM | POA: Diagnosis not present

## 2018-01-23 DIAGNOSIS — R600 Localized edema: Secondary | ICD-10-CM | POA: Insufficient documentation

## 2018-01-23 DIAGNOSIS — M25562 Pain in left knee: Secondary | ICD-10-CM | POA: Diagnosis not present

## 2018-01-23 NOTE — Therapy (Signed)
[facility] [place], [name], [address] Phone: [phone]   Fax:  [phone]  Physical Therapy Evaluation  Patient Details  Name: Selena Barnes MRN: [id number] Date of Birth: 01/30/1951 Referring Provider (PT): Joni Fears MD   Encounter Date: 01/23/2018  PT End of Session - 01/23/18 1300    Visit Number  1    Number of Visits  16    Date for PT Re-Evaluation  03/20/18    Authorization Type  FOTO AT LEAST EVERY 5TH VISIT, 10TH VISIT PROGRESS NOTE AND KX MODIFIER AFTER THE 15 VISIT.    PT Start Time  0945    PT Stop Time  1032    PT Time Calculation (min)  47 min    Activity Tolerance  Patient tolerated treatment well    Behavior During Therapy  WFL for tasks assessed/performed       Past Medical History:  Diagnosis Date  . Arthritis   . Constipation   . Depression   . GERD (gastroesophageal reflux disease)   . Hyperlipidemia   . Insomnia   . Migraines   . Osteopenia     Past Surgical History:  Procedure Laterality Date  . ABDOMINAL HYSTERECTOMY    . ANAL RECTAL MANOMETRY N/A 10/06/2015   Procedure: ANO RECTAL MANOMETRY;  Surgeon: Gatha Mayer, MD;  Location: [facility];  Service: Endoscopy;  Laterality: N/A;  . BREAST BIOPSY Right   . BREAST EXCISIONAL BIOPSY Right    benign  . CATARACT EXTRACTION W/ INTRAOCULAR LENS IMPLANT Bilateral 07/27/2016   and 09/27/2016, [employer] in [place]  . CATARACT EXTRACTION, BILATERAL    . CHOLECYSTECTOMY    . LYMPH NODE BIOPSY Left    left arm    There were no vitals filed for this visit.   Subjective Assessment - 01/23/18 1307    Subjective  The patient reports falling at church and sustaining a left knee injury.  She underwent an arthroscopic surgery on 12/31/17.  She states her leg swelled an dshe was found to have a bloodclot.  She is currently on Xarelto for this. her painis moderate today but can rise to higher levels with walking.   Resting decreases her pain.    Pertinent History  Arhritis; Migraines; osteopenia.    Patient Stated Goals  Walk without pain.    Currently in Pain?  Yes    Pain Score  4     Pain Location  Knee    Pain Orientation  Left    Pain Descriptors / Indicators  Aching    Pain Type  Surgical pain    Pain Onset  1 to 4 weeks ago    Pain Frequency  Constant    Aggravating Factors   See above.    Pain Relieving Factors  See above.         [facility] PT Assessment - 01/23/18 0001      Assessment   Medical Diagnosis  Acute medial meniscus tear, left.    Referring Provider (PT)  Joni Fears MD    Onset Date/Surgical Date  --   12/31/17 (surgery date).     Precautions   Precaution Comments  On Xarelto for left LE bloodclot.  No vasopneumatic.      Restrictions   Weight Bearing Restrictions  No      Balance Screen   Has the patient fallen in the past 6 months  Yes    How many times?  --  1.   Has the patient had a decrease in activity level because of a fear of falling?   Yes    Is the patient reluctant to leave their home because of a fear of falling?   No      Home Environment   Living Environment  Private residence      Prior Function   Level of Independence  Independent      Observation/Other Assessments   Focus on Therapeutic Outcomes (FOTO)   82% limitation.      AROM   Overall AROM Comments  Right active knee extension= -11 degrees and active flexion to 80 degrees.      Strength   Overall Strength Comments  left hip= 4/5 and left knee= 4 to 4+/5.      Palpation   Palpation comment  Very tender to palpation over left knee medial joint line.      Special Tests   Other special tests  Minimal+ decrease in left patellar mobility in all directions.  Circumferential measurement at apex of patella 2 cm greater on right than left.      Ambulation/Gait   Gait Comments  The patient is walking without an assistive device holding her left knee fixed in some flexion.                 Objective measurements completed on examination: See above findings.      [facility] Adult PT Treatment/Exercise - 01/23/18 0001      Modalities   Modalities  Cryotherapy;Electrical Stimulation      Cryotherapy   Number Minutes Cryotherapy  20 Minutes    Cryotherapy Location  --   Right knee.   Type of Cryotherapy  Ice pack      Electrical Stimulation   Electrical Stimulation Location  Right knee.    Electrical Stimulation Action  IFC    Electrical Stimulation Parameters  1-10 Hz x 20 minutes.    Electrical Stimulation Goals  Edema;Pain               PT Short Term Goals - 11/13/17 1302      PT SHORT TERM GOAL #1   Title  STG's=LTG's.        PT Long Term Goals - 01/23/18 1427      PT LONG TERM GOAL #1   Title  independent with HEP    Time  8    Period  Weeks    Status  New      PT LONG TERM GOAL #2   Title  Full left active knee extension in order to normalize gait.    Time  8    Period  Weeks    Status  New      PT LONG TERM GOAL #3   Title  Active knee flexion to 120-125 degrees+ so the patient can perform functional tasks and do so with pain not > 2-3/10.    Time  8    Period  Weeks    Status  New      PT LONG TERM GOAL #4   Title  Increase right knee strength to a solid 5/5 to provide good stability for accomplishment of functional activities.    Time  8    Period  Weeks    Status  New      PT LONG TERM GOAL #5   Title  Perform a reciprocating stair gait with one railing with pain not > 2-3/10.    Time  [place] - 01/23/18 1414    Clinical Impression Statement  The patient underwent left knee arthroscopic surgery on 12/21/17 after a fall which resulted in a tear to her left medial meniscus.  She later developed a bloddclot and is on medication for this.  Both her flexion and extension are limited currently  She has an expected amount of left knee edema.  She currently walks  with a gait deviation. Patient will benefit from skilled physical therapy intervention to address deficits and pain.    Clinical Presentation  Stable    Clinical Decision Making  Low    Rehab Potential  Excellent    PT Frequency  2x / week    PT Duration  8 weeks    PT Treatment/Interventions  ADLs/Self Care Home Management;Cryotherapy;Occupational psychologist;Therapeutic activities;Therapeutic exercise;Neuromuscular re-education;Patient/family education;Passive range of motion    PT Next Visit Plan  Left patelar mobs, Nustep with progression to bike, PROM, Pain-free strengthening exercise, electrical stimulation and CP.    Consulted and Agree with Plan of Care  Patient       Patient will benefit from skilled therapeutic intervention in order to improve the following deficits and impairments:  Pain, Abnormal gait, Decreased activity tolerance, Decreased strength, Decreased range of motion, Increased edema  Visit Diagnosis: Acute pain of left knee - Plan: PT plan of care cert/re-cert  Stiffness of left knee, not elsewhere classified - Plan: PT plan of care cert/re-cert  Localized edema - Plan: PT plan of care cert/re-cert     Problem List Patient Active Problem List   Diagnosis Date Noted  . Acute medial meniscal tear, left, initial encounter 12/07/2017  . Other cervical disc degeneration, unspecified cervical region 11/07/2017  . Constipation due to outlet dysfunction   . Osteopenia with high risk of fracture 02/05/2015  . BMI 26.0-26.9,adult 05/21/2014  . Heel spur 05/21/2014  . Migraines 12/20/2012  . Insomnia 12/20/2012  . Depression 12/20/2012  . GERD (gastroesophageal reflux disease) 12/20/2012  . Hyperlipidemia with target LDL less than 100 12/20/2012    Camilla Skeen, [place] MPT 01/23/2018, 2:34 PM  [facility] [address] [place], [name], [address] Phone: [phone]   Fax:  [phone]  Name:  Selena Barnes MRN: [id number] Date of Birth: 07-26-1950

## 2018-01-25 ENCOUNTER — Encounter: Payer: Self-pay | Admitting: Physical Therapy

## 2018-01-25 ENCOUNTER — Ambulatory Visit: Payer: Medicare Other | Admitting: Physical Therapy

## 2018-01-25 DIAGNOSIS — M25662 Stiffness of left knee, not elsewhere classified: Secondary | ICD-10-CM

## 2018-01-25 DIAGNOSIS — R600 Localized edema: Secondary | ICD-10-CM

## 2018-01-25 DIAGNOSIS — M25562 Pain in left knee: Secondary | ICD-10-CM

## 2018-01-25 NOTE — Therapy (Signed)
[facility] [place], [name], [address] Phone: [phone]   Fax:  [phone]  Physical Therapy Treatment  Patient Details  Name: Selena Barnes MRN: [id number] Date of Birth: 11-Mar-1950 Referring Provider (PT): Joni Fears MD   Encounter Date: 01/25/2018  PT End of Session - 01/25/18 0739    Visit Number  2    Number of Visits  16    Date for PT Re-Evaluation  03/20/18    Authorization Type  FOTO AT LEAST EVERY 5TH VISIT, 10TH VISIT PROGRESS NOTE AND KX MODIFIER AFTER THE 15 VISIT.    PT Start Time  [phone]   arrived earlier than check in time   PT Stop Time  0824    PT Time Calculation (min)  50 min    Activity Tolerance  Patient tolerated treatment well    Behavior During Therapy  [facility] for tasks assessed/performed       Past Medical History:  Diagnosis Date  . Arthritis   . Constipation   . Depression   . GERD (gastroesophageal reflux disease)   . Hyperlipidemia   . Insomnia   . Migraines   . Osteopenia     Past Surgical History:  Procedure Laterality Date  . ABDOMINAL HYSTERECTOMY    . ANAL RECTAL MANOMETRY N/A 10/06/2015   Procedure: ANO RECTAL MANOMETRY;  Surgeon: Gatha Mayer, MD;  Location: [facility];  Service: Endoscopy;  Laterality: N/A;  . BREAST BIOPSY Right   . BREAST EXCISIONAL BIOPSY Right    benign  . CATARACT EXTRACTION W/ INTRAOCULAR LENS IMPLANT Bilateral 07/27/2016   and 09/27/2016, [employer] in [place]  . CATARACT EXTRACTION, BILATERAL    . CHOLECYSTECTOMY    . LYMPH NODE BIOPSY Left    left arm    There were no vitals filed for this visit.  Subjective Assessment - 01/25/18 0740    Subjective  Patient arrives stating she's sore. Patient rated pain at 4/10.    Pertinent History  Arhritis; Migraines; osteopenia.    Diagnostic tests  MRI and X-ray (See under "Imaging" tab.    Patient Stated Goals  Walk without pain.    Currently in Pain?  Yes    Pain Score  4      Pain Location  Knee    Pain Orientation  Left    Pain Descriptors / Indicators  Aching;Sore    Pain Type  Surgical pain    Pain Onset  1 to 4 weeks ago    Pain Frequency  Constant         [facility] PT Assessment - 01/25/18 0001      Assessment   Medical Diagnosis  Acute medial meniscus tear, left.    Referring Provider (PT)  Joni Fears MD      Precautions   Precaution Comments  On Xarelto for left LE bloodclot.  No vasopneumatic.                   [facility] Adult PT Treatment/Exercise - 01/25/18 0001      Exercises   Exercises  Knee/Hip      Knee/Hip Exercises: Aerobic   Nustep  Level 2 x10 minutes      Knee/Hip Exercises: Supine   Short Arc Quad Sets  AROM;Left;2 sets;10 reps    Heel Slides  AAROM;Left;2 sets;10 reps      Modalities   Modalities  Cryotherapy;Electrical Stimulation      Cryotherapy   Number Minutes Cryotherapy  15 Minutes    Cryotherapy Location  Knee    Type of Cryotherapy  Ice pack      Electrical Stimulation   Electrical Stimulation Location  Right knee.    Electrical Stimulation Action  IFC    Electrical Stimulation Parameters  1-10 hz x15 min    Electrical Stimulation Goals  Edema;Pain      Manual Therapy   Manual Therapy  Passive ROM;Joint mobilization    Joint Mobilization  patella mobs in all planes to improve mobility and ROM    Passive ROM  gentle PROM to left knee to improve flexion and extension Intermittent oscillations to promote muscle relaxation.                PT Short Term Goals - 11/13/17 1302      PT SHORT TERM GOAL #1   Title  STG's=LTG's.        PT Long Term Goals - 01/23/18 1427      PT LONG TERM GOAL #1   Title  independent with HEP    Time  8    Period  Weeks    Status  New      PT LONG TERM GOAL #2   Title  Full left active knee extension in order to normalize gait.    Time  8    Period  Weeks    Status  New      PT LONG TERM GOAL #3   Title  Active knee flexion to 120-125  degrees+ so the patient can perform functional tasks and do so with pain not > 2-3/10.    Time  8    Period  Weeks    Status  New      PT LONG TERM GOAL #4   Title  Increase right knee strength to a solid 5/5 to provide good stability for accomplishment of functional activities.    Time  8    Period  Weeks    Status  New      PT LONG TERM GOAL #5   Title  Perform a reciprocating stair gait with one railing with pain not > 2-3/10.    Time  8    Period  Weeks    Status  New            Plan - 01/25/18 0815    Clinical Impression Statement  Patient was able to tolerate treatment well with no reports of increased pain. Patient was able to perform all exercises with good form. Patient noted with decreased patella mobility but noted with improved medial/lateral movement toward end manual therapy. Normal response to modalities at end of session.     Clinical Presentation  Stable    Clinical Decision Making  Low    Rehab Potential  Excellent    Clinical Impairments Affecting Rehab Potential       PT Frequency  2x / week    PT Duration  8 weeks    PT Treatment/Interventions  ADLs/Self Care Home Management;Cryotherapy;Occupational psychologist;Therapeutic activities;Therapeutic exercise;Neuromuscular re-education;Patient/family education;Passive range of motion    PT Next Visit Plan  Left patelar mobs, Nustep with progression to bike, PROM, Pain-free strengthening exercise, electrical stimulation and CP.    Consulted and Agree with Plan of Care  Patient       Patient will benefit from skilled therapeutic intervention in order to improve the following deficits and impairments:  Pain, Abnormal gait, Decreased activity tolerance, Decreased strength, Decreased range of motion,  Increased edema  Visit Diagnosis: Acute pain of left knee  Stiffness of left knee, not elsewhere classified  Localized edema     Problem List Patient Active Problem List    Diagnosis Date Noted  . Acute medial meniscal tear, left, initial encounter 12/07/2017  . Other cervical disc degeneration, unspecified cervical region 11/07/2017  . Constipation due to outlet dysfunction   . Osteopenia with high risk of fracture 02/05/2015  . BMI 26.0-26.9,adult 05/21/2014  . Heel spur 05/21/2014  . Migraines 12/20/2012  . Insomnia 12/20/2012  . Depression 12/20/2012  . GERD (gastroesophageal reflux disease) 12/20/2012  . Hyperlipidemia with target LDL less than 100 12/20/2012   Gabriela Eves, PT, DPT 01/25/2018, 8:36 AM  [facility] [place], [name], [address] Phone: [phone]   Fax:  [phone]  Name: Selena Barnes MRN: [id number] Date of Birth: 1950-06-04

## 2018-01-28 ENCOUNTER — Ambulatory Visit: Payer: Medicare Other | Admitting: Physical Therapy

## 2018-01-28 ENCOUNTER — Encounter: Payer: Self-pay | Admitting: Physical Therapy

## 2018-01-28 DIAGNOSIS — M25562 Pain in left knee: Secondary | ICD-10-CM | POA: Diagnosis not present

## 2018-01-28 DIAGNOSIS — R600 Localized edema: Secondary | ICD-10-CM

## 2018-01-28 DIAGNOSIS — M25662 Stiffness of left knee, not elsewhere classified: Secondary | ICD-10-CM | POA: Diagnosis not present

## 2018-01-28 NOTE — Therapy (Signed)
[facility] [place], [name], [address] Phone: [phone]   Fax:  [phone]  Physical Therapy Treatment  Patient Details  Name: Selena Barnes MRN: [id number] Date of Birth: 1950/06/01 Referring Provider (PT): Joni Fears MD   Encounter Date: 01/28/2018  PT End of Session - 01/28/18 0859    Visit Number  3    Number of Visits  16    Date for PT Re-Evaluation  03/20/18    Authorization Type  FOTO AT LEAST EVERY 5TH VISIT, 10TH VISIT PROGRESS NOTE AND KX MODIFIER AFTER THE 15 VISIT.    PT Start Time  0900    PT Stop Time  0951    PT Time Calculation (min)  51 min    Activity Tolerance  Patient tolerated treatment well    Behavior During Therapy  WFL for tasks assessed/performed       Past Medical History:  Diagnosis Date  . Arthritis   . Constipation   . Depression   . GERD (gastroesophageal reflux disease)   . Hyperlipidemia   . Insomnia   . Migraines   . Osteopenia     Past Surgical History:  Procedure Laterality Date  . ABDOMINAL HYSTERECTOMY    . ANAL RECTAL MANOMETRY N/A 10/06/2015   Procedure: ANO RECTAL MANOMETRY;  Surgeon: Gatha Mayer, MD;  Location: [facility];  Service: Endoscopy;  Laterality: N/A;  . BREAST BIOPSY Right   . BREAST EXCISIONAL BIOPSY Right    benign  . CATARACT EXTRACTION W/ INTRAOCULAR LENS IMPLANT Bilateral 07/27/2016   and 09/27/2016, [employer] in [place]  . CATARACT EXTRACTION, BILATERAL    . CHOLECYSTECTOMY    . LYMPH NODE BIOPSY Left    left arm    There were no vitals filed for this visit.  Subjective Assessment - 01/28/18 0917    Subjective  Patient reports feeling "tight"    Pertinent History  Arhritis; Migraines; osteopenia.    Diagnostic tests  MRI and X-ray (See under "Imaging" tab)    Patient Stated Goals  Walk without pain.    Currently in Pain?  Yes    Pain Score  3     Pain Location  Knee    Pain Orientation  Left    Pain  Descriptors / Indicators  Tightness;Sore    Pain Type  Surgical pain    Pain Onset  1 to 4 weeks ago    Pain Frequency  Constant         [facility] PT Assessment - 01/28/18 0001      Assessment   Medical Diagnosis  Acute medial meniscus tear, left.    Referring Provider (PT)  Joni Fears MD      Precautions   Precautions  Other (comment)    Precaution Comments  On Xarelto for left LE bloodclot.  No vasopneumatic.                   [facility] Adult PT Treatment/Exercise - 01/28/18 0001      Exercises   Exercises  Knee/Hip      Knee/Hip Exercises: Aerobic   Nustep  Level 2 x10 minutes seat 9      Knee/Hip Exercises: Supine   Quad Sets  AROM;Strengthening;Left;2 sets;10 reps   5" hold   Short Arc Quad Sets  --    Heel Slides  AAROM;Left;2 sets;10 reps    Straight Leg Raises  AROM;Left;10 reps      Modalities   Modalities  Cryotherapy;Electrical Stimulation      Cryotherapy   Number Minutes Cryotherapy  15 Minutes    Cryotherapy Location  Knee    Type of Cryotherapy  Ice pack      Electrical Stimulation   Electrical Stimulation Location  Right knee.    Electrical Stimulation Action  IFC    Electrical Stimulation Parameters  1-10 hz x15 min    Electrical Stimulation Goals  Edema;Pain      Manual Therapy   Manual Therapy  Passive ROM;Joint mobilization    Manual therapy comments       Joint Mobilization  patella mobs in all planes to improve mobility and ROM    Passive ROM  gentle PROM to left knee to improve flexion and extension Intermittent oscillations to promote muscle relaxation.                PT Short Term Goals - 11/13/17 1302      PT SHORT TERM GOAL #1   Title  STG's=LTG's.        PT Long Term Goals - 01/23/18 1427      PT LONG TERM GOAL #1   Title  independent with HEP    Time  8    Period  Weeks    Status  New      PT LONG TERM GOAL #2   Title  Full left active knee extension in order to normalize gait.    Time  8     Period  Weeks    Status  New      PT LONG TERM GOAL #3   Title  Active knee flexion to 120-125 degrees+ so the patient can perform functional tasks and do so with pain not > 2-3/10.    Time  8    Period  Weeks    Status  New      PT LONG TERM GOAL #4   Title  Increase right knee strength to a solid 5/5 to provide good stability for accomplishment of functional activities.    Time  8    Period  Weeks    Status  New      PT LONG TERM GOAL #5   Title  Perform a reciprocating stair gait with one railing with pain not > 2-3/10.    Time  8    Period  Weeks    Status  New            Plan - 01/28/18 0939    Clinical Impression Statement  Patient was able to tolerate treatment well despite ongoing tightness with AROM flexion. Patient noted wiht improved medial/lateral patella mobility but inferior/posterior patella mobility continues to be hypomobile. Smooth ROM noted with PROM with left knee tightness at end range. Normal response to modalities at end of session.     Clinical Presentation  Stable    Clinical Decision Making  Low    Rehab Potential  Excellent    PT Frequency  2x / week    PT Duration  8 weeks    PT Treatment/Interventions  ADLs/Self Care Home Management;Cryotherapy;Occupational psychologist;Therapeutic activities;Therapeutic exercise;Neuromuscular re-education;Patient/family education;Passive range of motion    PT Next Visit Plan  Left patelar mobs, Nustep with progression to bike, PROM, Pain-free strengthening exercise, electrical stimulation and CP.    Consulted and Agree with Plan of Care  Patient       Patient will benefit from skilled therapeutic intervention in order to improve the following deficits and impairments:  Pain, Abnormal gait, Decreased activity tolerance, Decreased strength, Decreased range of motion, Increased edema  Visit Diagnosis: Acute pain of left knee  Stiffness of left knee, not elsewhere  classified  Localized edema     Problem List Patient Active Problem List   Diagnosis Date Noted  . Acute medial meniscal tear, left, initial encounter 12/07/2017  . Other cervical disc degeneration, unspecified cervical region 11/07/2017  . Constipation due to outlet dysfunction   . Osteopenia with high risk of fracture 02/05/2015  . BMI 26.0-26.9,adult 05/21/2014  . Heel spur 05/21/2014  . Migraines 12/20/2012  . Insomnia 12/20/2012  . Depression 12/20/2012  . GERD (gastroesophageal reflux disease) 12/20/2012  . Hyperlipidemia with target LDL less than 100 12/20/2012   Gabriela Eves, PT, DPT 01/28/2018, 10:47 AM  [facility] [address] [place], [name], [address] Phone: [phone]   Fax:  [phone]  Name: Selena Barnes MRN: [id number] Date of Birth: 07/15/1950

## 2018-01-29 ENCOUNTER — Ambulatory Visit (INDEPENDENT_AMBULATORY_CARE_PROVIDER_SITE_OTHER): Payer: Medicare Other | Admitting: Orthopaedic Surgery

## 2018-01-29 ENCOUNTER — Encounter (INDEPENDENT_AMBULATORY_CARE_PROVIDER_SITE_OTHER): Payer: Self-pay | Admitting: Orthopaedic Surgery

## 2018-01-29 VITALS — BP 124/71 | HR 85 | Ht 64.0 in | Wt 147.0 lb

## 2018-01-29 DIAGNOSIS — I82452 Acute embolism and thrombosis of left peroneal vein: Secondary | ICD-10-CM

## 2018-01-29 MED ORDER — RIVAROXABAN 20 MG PO TABS: 20.0000 mg | ORAL_TABLET | Freq: Every day | ORAL | Status: DC

## 2018-01-29 MED ORDER — HYDROCODONE-ACETAMINOPHEN 5-325 MG PO TABS: 1.0000 | ORAL_TABLET | Freq: Four times a day (QID) | ORAL | 0 refills | Status: DC | PRN

## 2018-01-29 NOTE — Progress Notes (Signed)
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  She developed peroneal DVT and is on Xarelto.  She will return in 3 weeks and we will schedule her for a Doppler test to rule out persistence of the DVT.  She has pseudogout would like to be able give her an anti-inflammatory however not able to do so when she is on the Xarelto.  She is doing therapy for her knee has 2+ knee effusion.  Chief Complaint:  Chief Complaint  Patient presents with  . Left Knee - Routine Post Op, Edema, Pain   Visit Diagnoses:  1. Acute deep vein thrombosis (DVT) of left peroneal vein     Plan: Continue therapy Norco 30 tablets prescribed for pain that she can use intermittently.  Return 3 weeks.  We will schedule a Doppler test to evaluate her peroneal DVT at that time.  Follow-Up Instructions: Return in about 3 weeks (around 02/19/2018).   Orders:  No orders of the defined types were placed in this encounter.  Meds ordered this encounter  Medications  . rivaroxaban (XARELTO) tablet 20 mg    ONE PO DAILY 6 WKS SUPPLY. ( TREATMENT FOR DVT)  . HYDROcodone-acetaminophen (NORCO/VICODIN) 5-325 MG tablet    Sig: Take 1 tablet by mouth every 6 (six) hours as needed for moderate pain.    Dispense:  30 tablet    Refill:  0    Imaging: No results found.  PMFS History: Patient Active Problem List   Diagnosis Date Noted  . Acute medial meniscal tear, left, initial encounter 12/07/2017  . Other cervical disc degeneration, unspecified cervical region 11/07/2017  . Constipation due to outlet dysfunction   . Osteopenia with high risk of fracture 02/05/2015  . BMI 26.0-26.9,adult 05/21/2014  . Heel spur 05/21/2014  . Migraines 12/20/2012  . Insomnia 12/20/2012  . Depression 12/20/2012  . GERD (gastroesophageal reflux  disease) 12/20/2012  . Hyperlipidemia with target LDL less than 100 12/20/2012   Past Medical History:  Diagnosis Date  . Arthritis   . Constipation   . Depression   . GERD (gastroesophageal reflux disease)   . Hyperlipidemia   . Insomnia   . Migraines   . Osteopenia     Family History  Problem Relation Age of Onset  . Heart disease Mother 46       cause of death  . Heart disease Father 77       death  . Kidney disease Father   . Diabetes Brother   . Cancer Brother 70       bone marrow cancer  . Diabetes Grandchild        grandson    Past Surgical History:  Procedure Laterality Date  . ABDOMINAL HYSTERECTOMY    . ANAL RECTAL MANOMETRY N/A 10/06/2015   Procedure: ANO RECTAL MANOMETRY;  Surgeon: Gatha Mayer, MD;  Location: [facility];  Service: Endoscopy;  Laterality: N/A;  . BREAST BIOPSY Right   . BREAST EXCISIONAL BIOPSY Right    benign  . CATARACT EXTRACTION W/ INTRAOCULAR LENS IMPLANT Bilateral 07/27/2016   and 09/27/2016, [employer] in [place]  . CATARACT EXTRACTION, BILATERAL    . CHOLECYSTECTOMY    . LYMPH NODE BIOPSY  Left    left arm   Social History   Occupational History  . Occupation: retired  Tobacco Use  . Smoking status: Never Smoker  . Smokeless tobacco: Never Used  Substance and Sexual Activity  . Alcohol use: No    Alcohol/week: 0.0 standard drinks  . Drug use: No  . Sexual activity: Yes

## 2018-01-30 ENCOUNTER — Encounter: Payer: Self-pay | Admitting: Physical Therapy

## 2018-01-30 ENCOUNTER — Ambulatory Visit: Payer: Medicare Other | Admitting: Physical Therapy

## 2018-01-30 DIAGNOSIS — M25662 Stiffness of left knee, not elsewhere classified: Secondary | ICD-10-CM | POA: Diagnosis not present

## 2018-01-30 DIAGNOSIS — R600 Localized edema: Secondary | ICD-10-CM | POA: Diagnosis not present

## 2018-01-30 DIAGNOSIS — M25562 Pain in left knee: Secondary | ICD-10-CM | POA: Diagnosis not present

## 2018-01-30 NOTE — Therapy (Signed)
[facility] [place], [name], [address] Phone: [phone]   Fax:  [phone]  Physical Therapy Treatment  Patient Details  Name: Selena Barnes MRN: [id number] Date of Birth: 28-Jul-1950 Referring Provider (PT): Joni Fears MD   Encounter Date: 01/30/2018  PT End of Session - 01/30/18 1240    Visit Number  4    Number of Visits  16    Date for PT Re-Evaluation  03/20/18    Authorization Type  FOTO AT LEAST EVERY 5TH VISIT, 10TH VISIT PROGRESS NOTE AND KX MODIFIER AFTER THE 15 VISIT.    PT Start Time  1201    PT Stop Time  1256    PT Time Calculation (min)  55 min    Activity Tolerance  Patient tolerated treatment well    Behavior During Therapy  WFL for tasks assessed/performed       Past Medical History:  Diagnosis Date  . Arthritis   . Constipation   . Depression   . GERD (gastroesophageal reflux disease)   . Hyperlipidemia   . Insomnia   . Migraines   . Osteopenia     Past Surgical History:  Procedure Laterality Date  . ABDOMINAL HYSTERECTOMY    . ANAL RECTAL MANOMETRY N/A 10/06/2015   Procedure: ANO RECTAL MANOMETRY;  Surgeon: Gatha Mayer, MD;  Location: [facility];  Service: Endoscopy;  Laterality: N/A;  . BREAST BIOPSY Right   . BREAST EXCISIONAL BIOPSY Right    benign  . CATARACT EXTRACTION W/ INTRAOCULAR LENS IMPLANT Bilateral 07/27/2016   and 09/27/2016, [employer] in [place]  . CATARACT EXTRACTION, BILATERAL    . CHOLECYSTECTOMY    . LYMPH NODE BIOPSY Left    left arm    There were no vitals filed for this visit.  Subjective Assessment - 01/30/18 1216    Subjective  Patient reported feeling, "same" went to MD and continues to have blood clot    Pertinent History  Arhritis; Migraines; osteopenia.    Diagnostic tests  MRI and X-ray (See under "Imaging" tab)    Patient Stated Goals  Walk without pain.    Currently in Pain?  Yes    Pain Score  4     Pain Location   Knee    Pain Orientation  Left    Pain Descriptors / Indicators  Tightness;Sore    Pain Type  Surgical pain    Pain Onset  1 to 4 weeks ago    Pain Frequency  Constant    Aggravating Factors   walking    Pain Relieving Factors  rest         [facility] PT Assessment - 01/30/18 0001      ROM / Strength   AROM / PROM / Strength  AROM;PROM      AROM   AROM Assessment Site  Knee    Right/Left Knee  Left    Left Knee Extension  -11    Left Knee Flexion  94      PROM   PROM Assessment Site  Knee    Right/Left Knee  Left    Left Knee Extension  -9    Left Knee Flexion  100                   [facility] Adult PT Treatment/Exercise - 01/30/18 0001      Knee/Hip Exercises: Aerobic   Nustep  Level 2 x10 minutes seat 9  Knee/Hip Exercises: Supine   Quad Sets  AROM;Strengthening;Left;2 sets;10 reps    Short Arc Quad Sets  AROM;Left;2 sets;10 reps    Straight Leg Raises  AROM;Left;10 reps      Cryotherapy   Number Minutes Cryotherapy  15 Minutes    Cryotherapy Location  Knee    Type of Cryotherapy  Ice pack      Electrical Stimulation   Electrical Stimulation Location  Right knee.    Chartered certified accountant  IFC    Electrical Stimulation Parameters  1-10hz  x26min    Set designer Therapy   Manual Therapy  Passive ROM;Joint mobilization    Joint Mobilization  patella mobs in all planes to improve mobility and ROM    Passive ROM  gentle PROM to left knee to improve flexion and extension Intermittent oscillations to promote muscle relaxation.                PT Short Term Goals - 11/13/17 1302      PT SHORT TERM GOAL #1   Title  STG's=LTG's.        PT Long Term Goals - 01/30/18 1242      PT LONG TERM GOAL #1   Title  independent with HEP    Time  8    Period  Weeks    Status  On-going      PT LONG TERM GOAL #2   Title  Full left active knee extension in order to normalize gait.    Time  8    Period   Weeks    Status  On-going   -11 degrees 01/30/18     PT LONG TERM GOAL #3   Title  Active knee flexion to 120-125 degrees+ so the patient can perform functional tasks and do so with pain not > 2-3/10.    Time  8    Period  Weeks    Status  On-going      PT LONG TERM GOAL #4   Title  Increase right knee strength to a solid 5/5 to provide good stability for accomplishment of functional activities.    Time  8    Period  Weeks    Status  On-going      PT LONG TERM GOAL #5   Title  Perform a reciprocating stair gait with one railing with pain not > 2-3/10.    Time  8    Period  Weeks    Status  On-going            Plan - 01/30/18 1243    Clinical Impression Statement  Patient tolerated treatment fair due to pain in knee. Patient reported feeling the same thus far. Patient improved ROM in right knee and doing well with minimal exercises today. Goals ongoing.    Rehab Potential  Excellent    PT Frequency  2x / week    PT Duration  8 weeks    PT Treatment/Interventions  ADLs/Self Care Home Management;Cryotherapy;Occupational psychologist;Therapeutic activities;Therapeutic exercise;Neuromuscular re-education;Patient/family education;Passive range of motion    PT Next Visit Plan  Left patelar mobs, Nustep with progression to bike, PROM, Pain-free strengthening exercise, electrical stimulation and CP.    Consulted and Agree with Plan of Care  Patient       Patient will benefit from skilled therapeutic intervention in order to improve the following deficits and impairments:  Pain, Abnormal gait, Decreased activity tolerance, Decreased strength, Decreased range  of motion, Increased edema  Visit Diagnosis: Acute pain of left knee  Stiffness of left knee, not elsewhere classified  Localized edema     Problem List Patient Active Problem List   Diagnosis Date Noted  . Acute medial meniscal tear, left, initial encounter 12/07/2017  . Other cervical disc  degeneration, unspecified cervical region 11/07/2017  . Constipation due to outlet dysfunction   . Osteopenia with high risk of fracture 02/05/2015  . BMI 26.0-26.9,adult 05/21/2014  . Heel spur 05/21/2014  . Migraines 12/20/2012  . Insomnia 12/20/2012  . Depression 12/20/2012  . GERD (gastroesophageal reflux disease) 12/20/2012  . Hyperlipidemia with target LDL less than 100 12/20/2012    Latonyia Lopata P, PTA 01/30/2018, 1:02 PM  [facility] [place], [name], [address] Phone: [phone]   Fax:  [phone]  Name: REVELLA SHELTON MRN: [id number] Date of Birth: 12-25-1950

## 2018-02-01 ENCOUNTER — Ambulatory Visit: Payer: Medicare Other | Admitting: *Deleted

## 2018-02-01 DIAGNOSIS — M25562 Pain in left knee: Secondary | ICD-10-CM

## 2018-02-01 DIAGNOSIS — M25662 Stiffness of left knee, not elsewhere classified: Secondary | ICD-10-CM

## 2018-02-01 DIAGNOSIS — R600 Localized edema: Secondary | ICD-10-CM | POA: Diagnosis not present

## 2018-02-01 NOTE — Therapy (Signed)
[facility] [place], [name], [address] Phone: [phone]   Fax:  [phone]  Physical Therapy Treatment  Patient Details  Name: Selena Barnes MRN: [id number] Date of Birth: 1950-09-10 Referring Provider (PT): Joni Fears MD   Encounter Date: 02/01/2018  PT End of Session - 02/01/18 0911    Visit Number  5    Number of Visits  16    Date for PT Re-Evaluation  03/20/18    Authorization Type  FOTO AT LEAST EVERY 5TH VISIT, 10TH VISIT PROGRESS NOTE AND KX MODIFIER AFTER THE 15 VISIT.    PT Start Time  0815    PT Stop Time  0905    PT Time Calculation (min)  50 min       Past Medical History:  Diagnosis Date  . Arthritis   . Constipation   . Depression   . GERD (gastroesophageal reflux disease)   . Hyperlipidemia   . Insomnia   . Migraines   . Osteopenia     Past Surgical History:  Procedure Laterality Date  . ABDOMINAL HYSTERECTOMY    . ANAL RECTAL MANOMETRY N/A 10/06/2015   Procedure: ANO RECTAL MANOMETRY;  Surgeon: Gatha Mayer, MD;  Location: [facility];  Service: Endoscopy;  Laterality: N/A;  . BREAST BIOPSY Right   . BREAST EXCISIONAL BIOPSY Right    benign  . CATARACT EXTRACTION W/ INTRAOCULAR LENS IMPLANT Bilateral 07/27/2016   and 09/27/2016, [employer] in [place]  . CATARACT EXTRACTION, BILATERAL    . CHOLECYSTECTOMY    . LYMPH NODE BIOPSY Left    left arm    There were no vitals filed for this visit.  Subjective Assessment - 02/01/18 0819    Subjective  Patient reported feeling, "same" went to MD and continues to have blood clot. Feels tight    Patient is accompained by:  Family member    Pertinent History  Arhritis; Migraines; osteopenia.    Diagnostic tests  MRI and X-ray (See under "Imaging" tab)    Patient Stated Goals  Walk without pain.    Currently in Pain?  Yes    Pain Score  4     Pain Location  Knee    Pain Orientation  Left    Pain Descriptors /  Indicators  Tightness;Sore    Pain Type  Surgical pain    Pain Onset  1 to 4 weeks ago                       [facility] Adult PT Treatment/Exercise - 02/01/18 0001      Exercises   Exercises  Knee/Hip      Knee/Hip Exercises: Aerobic   Nustep  Level 3  x15 minutes seat 9,  8      Knee/Hip Exercises: Seated   Long Arc Quad  Left;2 sets;10 reps    Long Arc Quad Weight  2 lbs.      Knee/Hip Exercises: Supine   Quad Sets  AROM;Strengthening;Left;2 sets;10 reps    Short Arc Quad Sets  AROM;Left;2 sets;10 reps   2#   Straight Leg Raises  AROM;Left;10 reps;2 sets      Modalities   Modalities  Cryotherapy;Electrical Stimulation      Cryotherapy   Number Minutes Cryotherapy  15 Minutes    Cryotherapy Location  Knee    Type of Cryotherapy  Ice pack      Electrical Stimulation   Electrical Stimulation Location  Right  knee. IFC 1-10hz  x 15 mins    Electrical Stimulation Goals  Edema;Pain      Manual Therapy   Manual Therapy  Passive ROM;Joint mobilization    Joint Mobilization  patella mobs in all planes to improve mobility and ROM    Passive ROM  gentle PROM to left knee to improve flexion and extension Intermittent oscillations to promote muscle relaxation.                PT Short Term Goals - 11/13/17 1302      PT SHORT TERM GOAL #1   Title  STG's=LTG's.        PT Long Term Goals - 01/30/18 1242      PT LONG TERM GOAL #1   Title  independent with HEP    Time  8    Period  Weeks    Status  On-going      PT LONG TERM GOAL #2   Title  Full left active knee extension in order to normalize gait.    Time  8    Period  Weeks    Status  On-going   -11 degrees 01/30/18     PT LONG TERM GOAL #3   Title  Active knee flexion to 120-125 degrees+ so the patient can perform functional tasks and do so with pain not > 2-3/10.    Time  8    Period  Weeks    Status  On-going      PT LONG TERM GOAL #4   Title  Increase right knee strength to a solid 5/5  to provide good stability for accomplishment of functional activities.    Time  8    Period  Weeks    Status  On-going      PT LONG TERM GOAL #5   Title  Perform a reciprocating stair gait with one railing with pain not > 2-3/10.    Time  8    Period  Weeks    Status  On-going            Plan - 02/01/18 0908    Clinical Impression Statement  Pt arrived today doing fair. LT knee continues to have pain and swelling. She was able to perform therex with 2#s today without complaints. AROM flexion to 114 degrees today . Nornal modality response.    Rehab Potential  Excellent    PT Frequency  2x / week    PT Duration  8 weeks    PT Treatment/Interventions  ADLs/Self Care Home Management;Cryotherapy;Occupational psychologist;Therapeutic activities;Therapeutic exercise;Neuromuscular re-education;Patient/family education;Passive range of motion    PT Next Visit Plan  Left patelar mobs, Nustep with progression to bike, PROM, Pain-free strengthening exercise, electrical stimulation and CP.    Consulted and Agree with Plan of Care  Patient       Patient will benefit from skilled therapeutic intervention in order to improve the following deficits and impairments:  Pain, Abnormal gait, Decreased activity tolerance, Decreased strength, Decreased range of motion, Increased edema  Visit Diagnosis: Acute pain of left knee  Stiffness of left knee, not elsewhere classified  Localized edema     Problem List Patient Active Problem List   Diagnosis Date Noted  . Acute medial meniscal tear, left, initial encounter 12/07/2017  . Other cervical disc degeneration, unspecified cervical region 11/07/2017  . Constipation due to outlet dysfunction   . Osteopenia with high risk of fracture 02/05/2015  . BMI 26.0-26.9,adult 05/21/2014  . Heel spur  05/21/2014  . Migraines 12/20/2012  . Insomnia 12/20/2012  . Depression 12/20/2012  . GERD (gastroesophageal reflux disease)  12/20/2012  . Hyperlipidemia with target LDL less than 100 12/20/2012    Deeann Servidio,CHRIS, PTA 02/01/2018, 9:11 AM  [facility] [place], [name], [address] Phone: [phone]   Fax:  [phone]  Name: Selena Barnes MRN: [id number] Date of Birth: [birth year]

## 2018-02-05 ENCOUNTER — Ambulatory Visit: Payer: Medicare Other | Admitting: Physical Therapy

## 2018-02-05 ENCOUNTER — Encounter: Payer: Self-pay | Admitting: Physical Therapy

## 2018-02-05 DIAGNOSIS — M25562 Pain in left knee: Secondary | ICD-10-CM | POA: Diagnosis not present

## 2018-02-05 DIAGNOSIS — R600 Localized edema: Secondary | ICD-10-CM

## 2018-02-05 DIAGNOSIS — M25662 Stiffness of left knee, not elsewhere classified: Secondary | ICD-10-CM

## 2018-02-05 NOTE — Therapy (Signed)
[facility] [place], [name], [address] Phone: [phone]   Fax:  [phone]  Physical Therapy Treatment  Patient Details  Name: Selena Barnes MRN: [id number] Date of Birth: September 15, 1950 Referring Provider (PT): Joni Fears MD   Encounter Date: 02/05/2018  PT End of Session - 02/05/18 0741    Visit Number  6    Number of Visits  16    Date for PT Re-Evaluation  03/20/18    Authorization Type  FOTO AT LEAST EVERY 5TH VISIT, 10TH VISIT PROGRESS NOTE AND KX MODIFIER AFTER THE 15 VISIT.    PT Start Time  0735    PT Stop Time  0821    PT Time Calculation (min)  46 min    Activity Tolerance  Patient tolerated treatment well    Behavior During Therapy  [facility] for tasks assessed/performed       Past Medical History:  Diagnosis Date  . Arthritis   . Constipation   . Depression   . GERD (gastroesophageal reflux disease)   . Hyperlipidemia   . Insomnia   . Migraines   . Osteopenia     Past Surgical History:  Procedure Laterality Date  . ABDOMINAL HYSTERECTOMY    . ANAL RECTAL MANOMETRY N/A 10/06/2015   Procedure: ANO RECTAL MANOMETRY;  Surgeon: Gatha Mayer, MD;  Location: [facility];  Service: Endoscopy;  Laterality: N/A;  . BREAST BIOPSY Right   . BREAST EXCISIONAL BIOPSY Right    benign  . CATARACT EXTRACTION W/ INTRAOCULAR LENS IMPLANT Bilateral 07/27/2016   and 09/27/2016, [employer] in [place]  . CATARACT EXTRACTION, BILATERAL    . CHOLECYSTECTOMY    . LYMPH NODE BIOPSY Left    left arm    There were no vitals filed for this visit.  Subjective Assessment - 02/05/18 0736    Subjective  Reports that her RLE is still swollen and goes back 02/19/2018 to assess if blood clot is still present.    Pertinent History  Arhritis; Migraines; osteopenia.    Diagnostic tests  MRI and X-ray (See under "Imaging" tab)    Patient Stated Goals  Walk without pain.    Currently in Pain?  Yes    Pain Score   4     Pain Location  Knee    Pain Orientation  Left    Pain Descriptors / Indicators  Tightness    Pain Type  Surgical pain    Pain Onset  1 to 4 weeks ago         [facility] PT Assessment - 02/05/18 0001      Assessment   Medical Diagnosis  Acute medial meniscus tear, left.    Referring Provider (PT)  Joni Fears MD    Hand Dominance  Right      Precautions   Precautions  Other (comment)    Precaution Comments  On Xarelto for left LE bloodclot.  No vasopneumatic.                   [facility] Adult PT Treatment/Exercise - 02/05/18 0001      Knee/Hip Exercises: Aerobic   Nustep  L4, seat 9 x11 min      Knee/Hip Exercises: Standing   Forward Lunges  Left;2 sets;10 reps;3 seconds      Knee/Hip Exercises: Seated   Long Arc Quad  Left;2 sets;10 reps    Long Arc Quad Weight  3 lbs.      Knee/Hip Exercises: Supine  Straight Leg Raises  AROM;Left;2 sets;10 reps    Straight Leg Raise with External Rotation  AROM;Left;2 sets;10 reps      Modalities   Modalities  Cryotherapy;Electrical Stimulation      Cryotherapy   Number Minutes Cryotherapy  15 Minutes    Cryotherapy Location  Knee    Type of Cryotherapy  Ice pack      Electrical Stimulation   Electrical Stimulation Location  L knee    Electrical Stimulation Action  IFC    Electrical Stimulation Parameters  80-150 hz x15 min    Electrical Stimulation Goals  Edema;Pain      Manual Therapy   Manual Therapy  Soft tissue mobilization;Passive ROM    Soft tissue mobilization  L patella mobs in lat/med, sup/inf glides to improve mobility    Passive ROM  PROM of L knee into flexion                PT Short Term Goals - 11/13/17 1302      PT SHORT TERM GOAL #1   Title  STG's=LTG's.        PT Long Term Goals - 01/30/18 1242      PT LONG TERM GOAL #1   Title  independent with HEP    Time  8    Period  Weeks    Status  On-going      PT LONG TERM GOAL #2   Title  Full left active knee extension in  order to normalize gait.    Time  8    Period  Weeks    Status  On-going   -11 degrees 01/30/18     PT LONG TERM GOAL #3   Title  Active knee flexion to 120-125 degrees+ so the patient can perform functional tasks and do so with pain not > 2-3/10.    Time  8    Period  Weeks    Status  On-going      PT LONG TERM GOAL #4   Title  Increase right knee strength to a solid 5/5 to provide good stability for accomplishment of functional activities.    Time  8    Period  Weeks    Status  On-going      PT LONG TERM GOAL #5   Title  Perform a reciprocating stair gait with one railing with pain not > 2-3/10.    Time  8    Period  Weeks    Status  On-going            Plan - 02/05/18 0809    Clinical Impression Statement  Patient tolerated today's treatment well although still experiencing L knee tightness and discomfort. Patient still compliant with Xaralto instructions at this time. Patient able to complete all exercises as directed with ROM and strengthening focus. Minimal mobility in med/lat patella mobs but very limited sup/inf glides of L patellar mobs. Normal modalities response noted following removal of the modalities.     Rehab Potential  Excellent    PT Frequency  2x / week    PT Duration  8 weeks    PT Treatment/Interventions  ADLs/Self Care Home Management;Cryotherapy;Occupational psychologist;Therapeutic activities;Therapeutic exercise;Neuromuscular re-education;Patient/family education;Passive range of motion    PT Next Visit Plan  Left patelar mobs, Nustep with progression to bike, PROM, Pain-free strengthening exercise, electrical stimulation and CP.    Consulted and Agree with Plan of Care  Patient       Patient will benefit  from skilled therapeutic intervention in order to improve the following deficits and impairments:  Pain, Abnormal gait, Decreased activity tolerance, Decreased strength, Decreased range of motion, Increased edema  Visit  Diagnosis: Acute pain of left knee  Stiffness of left knee, not elsewhere classified  Localized edema     Problem List Patient Active Problem List   Diagnosis Date Noted  . Acute medial meniscal tear, left, initial encounter 12/07/2017  . Other cervical disc degeneration, unspecified cervical region 11/07/2017  . Constipation due to outlet dysfunction   . Osteopenia with high risk of fracture 02/05/2015  . BMI 26.0-26.9,adult 05/21/2014  . Heel spur 05/21/2014  . Migraines 12/20/2012  . Insomnia 12/20/2012  . Depression 12/20/2012  . GERD (gastroesophageal reflux disease) 12/20/2012  . Hyperlipidemia with target LDL less than 100 12/20/2012    Standley Brooking, PTA 02/05/2018, 8:51 AM  [facility] [place], [name], [address] Phone: [phone]   Fax:  [phone]  Name: Selena Barnes MRN: [id number] Date of Birth: 06-Sep-1950

## 2018-02-08 ENCOUNTER — Encounter: Payer: Self-pay | Admitting: Physical Therapy

## 2018-02-08 ENCOUNTER — Ambulatory Visit: Payer: Medicare Other | Admitting: Physical Therapy

## 2018-02-08 DIAGNOSIS — M25662 Stiffness of left knee, not elsewhere classified: Secondary | ICD-10-CM

## 2018-02-08 DIAGNOSIS — M25562 Pain in left knee: Secondary | ICD-10-CM

## 2018-02-08 DIAGNOSIS — R600 Localized edema: Secondary | ICD-10-CM | POA: Diagnosis not present

## 2018-02-08 NOTE — Therapy (Signed)
[facility] [place], [name], [address] Phone: [phone]   Fax:  [phone]  Physical Therapy Treatment  Patient Details  Name: Selena Barnes MRN: [id number] Date of Birth: 19-Apr-1950 Referring Provider (PT): Joni Fears MD   Encounter Date: 02/08/2018  PT End of Session - 02/08/18 0736    Visit Number  7    Number of Visits  16    Date for PT Re-Evaluation  03/20/18    Authorization Type  FOTO AT LEAST EVERY 5TH VISIT, 10TH VISIT PROGRESS NOTE AND KX MODIFIER AFTER THE 15 VISIT.    PT Start Time  0729    PT Stop Time  0816    PT Time Calculation (min)  47 min    Activity Tolerance  Patient tolerated treatment well    Behavior During Therapy  [facility] for tasks assessed/performed       Past Medical History:  Diagnosis Date  . Arthritis   . Constipation   . Depression   . GERD (gastroesophageal reflux disease)   . Hyperlipidemia   . Insomnia   . Migraines   . Osteopenia     Past Surgical History:  Procedure Laterality Date  . ABDOMINAL HYSTERECTOMY    . ANAL RECTAL MANOMETRY N/A 10/06/2015   Procedure: ANO RECTAL MANOMETRY;  Surgeon: Gatha Mayer, MD;  Location: [facility];  Service: Endoscopy;  Laterality: N/A;  . BREAST BIOPSY Right   . BREAST EXCISIONAL BIOPSY Right    benign  . CATARACT EXTRACTION W/ INTRAOCULAR LENS IMPLANT Bilateral 07/27/2016   and 09/27/2016, [employer] in [place]  . CATARACT EXTRACTION, BILATERAL    . CHOLECYSTECTOMY    . LYMPH NODE BIOPSY Left    left arm    There were no vitals filed for this visit.  Subjective Assessment - 02/08/18 0730    Subjective  Reports being busy over the holiday and her LLE being swollen from L knee to L foot.     Pertinent History  Arhritis; Migraines; osteopenia.    Diagnostic tests  MRI and X-ray (See under "Imaging" tab)    Patient Stated Goals  Walk without pain.    Currently in Pain?  Yes    Pain Score  5     Pain  Location  Leg    Pain Orientation  Left    Pain Descriptors / Indicators  Tightness    Pain Type  Surgical pain    Pain Onset  1 to 4 weeks ago    Pain Frequency  Constant    Aggravating Factors   Standing, walking         [facility] PT Assessment - 02/08/18 0001      Assessment   Medical Diagnosis  Acute medial meniscus tear, left.    Referring Provider (PT)  Joni Fears MD    Hand Dominance  Right      Precautions   Precautions  Other (comment)    Precaution Comments  On Xarelto for left LE bloodclot.  No vasopneumatic.      Observation/Other Assessments-Edema    Edema  Circumferential      Circumferential Edema   Circumferential - Right  midpat 38 cm, midcalf 30.6 cm, ankle 24 cm    Circumferential - Left   midpat 39.5 cm, midcalf 31.2 cm, ankle 24.7 cm                   [facility] Adult PT Treatment/Exercise - 02/08/18 0001  Knee/Hip Exercises: Aerobic   Nustep  L6, seat 9 x10 min      Knee/Hip Exercises: Seated   Long Arc Quad  Left;2 sets;10 reps    Long Arc Quad Weight  4 lbs.      Knee/Hip Exercises: Supine   Short Arc Quad Sets  AROM;Left;2 sets;10 reps    Heel Slides  AROM;Left;15 reps    Straight Leg Raises  AROM;Left;2 sets;10 reps    Straight Leg Raise with External Rotation  AROM;Left;2 sets;10 reps      Modalities   Modalities  Cryotherapy;Electrical Stimulation      Cryotherapy   Number Minutes Cryotherapy  15 Minutes    Cryotherapy Location  Knee    Type of Cryotherapy  Ice pack      Electrical Stimulation   Electrical Stimulation Location  L knee    Electrical Stimulation Action  IFC    Electrical Stimulation Parameters  80-150 hz x15 min    Electrical Stimulation Goals  Edema;Pain               PT Short Term Goals - 11/13/17 1302      PT SHORT TERM GOAL #1   Title  STG's=LTG's.        PT Long Term Goals - 01/30/18 1242      PT LONG TERM GOAL #1   Title  independent with HEP    Time  8    Period  Weeks     Status  On-going      PT LONG TERM GOAL #2   Title  Full left active knee extension in order to normalize gait.    Time  8    Period  Weeks    Status  On-going   -11 degrees 01/30/18     PT LONG TERM GOAL #3   Title  Active knee flexion to 120-125 degrees+ so the patient can perform functional tasks and do so with pain not > 2-3/10.    Time  8    Period  Weeks    Status  On-going      PT LONG TERM GOAL #4   Title  Increase right knee strength to a solid 5/5 to provide good stability for accomplishment of functional activities.    Time  8    Period  Weeks    Status  On-going      PT LONG TERM GOAL #5   Title  Perform a reciprocating stair gait with one railing with pain not > 2-3/10.    Time  8    Period  Weeks    Status  On-going            Plan - 02/08/18 0809    Clinical Impression Statement  Patient presented in clinic with reports of tightness and swelling down LLE. Edema measurements provided in today's note. No standing exercises completed today as to allow patient to rest. No abnormal redness noted in LLE but patient reporting tightness even in posterior knee. Tightness increased with knee flexion such as during nustep and heel slides. Normal modalities response noted following removal of the modalities.    Rehab Potential  Excellent    PT Frequency  2x / week    PT Duration  8 weeks    PT Treatment/Interventions  ADLs/Self Care Home Management;Cryotherapy;Occupational psychologist;Therapeutic activities;Therapeutic exercise;Neuromuscular re-education;Patient/family education;Passive range of motion    PT Next Visit Plan  Left patelar mobs, Nustep with progression to bike, PROM, Pain-free strengthening  exercise, electrical stimulation and CP.    Consulted and Agree with Plan of Care  Patient       Patient will benefit from skilled therapeutic intervention in order to improve the following deficits and impairments:  Pain, Abnormal gait,  Decreased activity tolerance, Decreased strength, Decreased range of motion, Increased edema  Visit Diagnosis: Acute pain of left knee  Stiffness of left knee, not elsewhere classified  Localized edema     Problem List Patient Active Problem List   Diagnosis Date Noted  . Acute medial meniscal tear, left, initial encounter 12/07/2017  . Other cervical disc degeneration, unspecified cervical region 11/07/2017  . Constipation due to outlet dysfunction   . Osteopenia with high risk of fracture 02/05/2015  . BMI 26.0-26.9,adult 05/21/2014  . Heel spur 05/21/2014  . Migraines 12/20/2012  . Insomnia 12/20/2012  . Depression 12/20/2012  . GERD (gastroesophageal reflux disease) 12/20/2012  . Hyperlipidemia with target LDL less than 100 12/20/2012    Standley Brooking, PTA 02/08/2018, 8:19 AM  [facility] [address] [place], [name], [address] Phone: [phone]   Fax:  [phone]  Name: Selena Barnes MRN: [id number] Date of Birth: [birth year]

## 2018-02-12 ENCOUNTER — Encounter: Payer: Self-pay | Admitting: Physical Therapy

## 2018-02-12 ENCOUNTER — Ambulatory Visit: Payer: Medicare Other | Admitting: Physical Therapy

## 2018-02-12 DIAGNOSIS — M25562 Pain in left knee: Secondary | ICD-10-CM

## 2018-02-12 DIAGNOSIS — R600 Localized edema: Secondary | ICD-10-CM | POA: Diagnosis not present

## 2018-02-12 DIAGNOSIS — M25662 Stiffness of left knee, not elsewhere classified: Secondary | ICD-10-CM

## 2018-02-12 NOTE — Therapy (Addendum)
[facility] [place], [name], [address] Phone: [phone]   Fax:  [phone]  Physical Therapy Treatment  Patient Details  Name: Selena Barnes MRN: [id number] Date of Birth: 09/30/1950 Referring Provider (PT): Joni Fears MD   Encounter Date: 02/12/2018  PT End of Session - 02/12/18 0742    Visit Number  8    Number of Visits  16    Date for PT Re-Evaluation  03/20/18    Authorization Type  FOTO AT LEAST EVERY 5TH VISIT, 10TH VISIT PROGRESS NOTE AND KX MODIFIER AFTER THE 15 VISIT.    PT Start Time  0732    PT Stop Time  0824    PT Time Calculation (min)  52 min    Activity Tolerance  Patient tolerated treatment well    Behavior During Therapy  [facility] for tasks assessed/performed       Past Medical History:  Diagnosis Date  . Arthritis   . Constipation   . Depression   . GERD (gastroesophageal reflux disease)   . Hyperlipidemia   . Insomnia   . Migraines   . Osteopenia     Past Surgical History:  Procedure Laterality Date  . ABDOMINAL HYSTERECTOMY    . ANAL RECTAL MANOMETRY N/A 10/06/2015   Procedure: ANO RECTAL MANOMETRY;  Surgeon: Gatha Mayer, MD;  Location: [facility];  Service: Endoscopy;  Laterality: N/A;  . BREAST BIOPSY Right   . BREAST EXCISIONAL BIOPSY Right    benign  . CATARACT EXTRACTION W/ INTRAOCULAR LENS IMPLANT Bilateral 07/27/2016   and 09/27/2016, [employer] in [place]  . CATARACT EXTRACTION, BILATERAL    . CHOLECYSTECTOMY    . LYMPH NODE BIOPSY Left    left arm    There were no vitals filed for this visit.  Subjective Assessment - 02/12/18 0742    Subjective  Patient reports feeling "alright"     Pertinent History  Arhritis; Migraines; osteopenia.    Diagnostic tests  MRI and X-ray (See under "Imaging" tab)    Patient Stated Goals  Walk without pain.    Currently in Pain?  Yes    Pain Score  3     Pain Location  Leg    Pain Orientation  Left    Pain  Descriptors / Indicators  Tightness    Pain Type  Surgical pain    Pain Onset  1 to 4 weeks ago         [facility] PT Assessment - 02/12/18 0001      Assessment   Medical Diagnosis  Acute medial meniscus tear, left.    Referring Provider (PT)  Joni Fears MD    Hand Dominance  Right      Precautions   Precautions  Other (comment)    Precaution Comments  On Xarelto for left LE bloodclot.  No vasopneumatic.                   [facility] Adult PT Treatment/Exercise - 02/12/18 0001      Knee/Hip Exercises: Stretches   Hip Flexor Stretch  Left;2 reps;30 seconds      Knee/Hip Exercises: Aerobic   Nustep  L6, seat 9 x10 min      Knee/Hip Exercises: Seated   Long Arc Quad  Left;2 sets;10 reps    Long Arc Quad Weight  4 lbs.      Knee/Hip Exercises: Supine   Bridges  Both;2 sets;10 reps    Straight Leg Raises  AROM;Left;2 sets;10 reps    Straight Leg Raise with External Rotation  AROM;Left;2 sets;10 reps      Manual Therapy   Manual Therapy  Passive ROM;Joint mobilization    Joint Mobilization  patella mobs in all planes to improve mobility and ROM    Passive ROM  PROM of L knee into flexion         ice pack on left knee x15 mins IFC 80-150 hz x15 mins for edema and pain   Gabriela Eves, PT, DPT 02/15/2018      PT Short Term Goals - 11/13/17 1302      PT SHORT TERM GOAL #1   Title  STG's=LTG's.        PT Long Term Goals - 01/30/18 1242      PT LONG TERM GOAL #1   Title  independent with HEP    Time  8    Period  Weeks    Status  On-going      PT LONG TERM GOAL #2   Title  Full left active knee extension in order to normalize gait.    Time  8    Period  Weeks    Status  On-going   -11 degrees 01/30/18     PT LONG TERM GOAL #3   Title  Active knee flexion to 120-125 degrees+ so the patient can perform functional tasks and do so with pain not > 2-3/10.    Time  8    Period  Weeks    Status  On-going      PT LONG TERM GOAL #4   Title   Increase right knee strength to a solid 5/5 to provide good stability for accomplishment of functional activities.    Time  8    Period  Weeks    Status  On-going      PT LONG TERM GOAL #5   Title  Perform a reciprocating stair gait with one railing with pain not > 2-3/10.    Time  8    Period  Weeks    Status  On-going            Plan - 02/12/18 0747    Clinical Impression Statement  Patient was able to tolerate treatment well today. Patient continues to demonstrate hypomobile patella joint mobilization in all planes, especially superior/inferior. Patient's PROM is smooth but continues to report tightness in anterior knee with knee flexion. Patient provided with HEP consisting of various hip flexor stretches to decrease tightness. Patient reported understanding. Normal response to modalities upon removal.     Clinical Presentation  Stable    Clinical Decision Making  Low    Rehab Potential  Excellent    PT Frequency  2x / week    PT Duration  8 weeks    PT Treatment/Interventions  ADLs/Self Care Home Management;Cryotherapy;Occupational psychologist;Therapeutic activities;Therapeutic exercise;Neuromuscular re-education;Patient/family education;Passive range of motion    PT Next Visit Plan  Left patellar mobs, Nustep with progression to bike, PROM, Pain-free strengthening exercise, electrical stimulation and CP.    Consulted and Agree with Plan of Care  Patient       Patient will benefit from skilled therapeutic intervention in order to improve the following deficits and impairments:  Pain, Abnormal gait, Decreased activity tolerance, Decreased strength, Decreased range of motion, Increased edema  Visit Diagnosis: Acute pain of left knee  Stiffness of left knee, not elsewhere classified  Localized edema     Problem List Patient Active  Problem List   Diagnosis Date Noted  . Acute medial meniscal tear, left, initial encounter 12/07/2017  . Other  cervical disc degeneration, unspecified cervical region 11/07/2017  . Constipation due to outlet dysfunction   . Osteopenia with high risk of fracture 02/05/2015  . BMI 26.0-26.9,adult 05/21/2014  . Heel spur 05/21/2014  . Migraines 12/20/2012  . Insomnia 12/20/2012  . Depression 12/20/2012  . GERD (gastroesophageal reflux disease) 12/20/2012  . Hyperlipidemia with target LDL less than 100 12/20/2012   Gabriela Eves, PT, DPT 02/12/2018, 9:06 AM  [facility] [place], [name], [address] Phone: [phone]   Fax:  [phone]  Name: Selena Barnes MRN: [id number] Date of Birth: 14-Dec-1950

## 2018-02-14 ENCOUNTER — Other Ambulatory Visit (INDEPENDENT_AMBULATORY_CARE_PROVIDER_SITE_OTHER): Payer: Self-pay | Admitting: Orthopaedic Surgery

## 2018-02-14 DIAGNOSIS — I82452 Acute embolism and thrombosis of left peroneal vein: Secondary | ICD-10-CM

## 2018-02-14 NOTE — Telephone Encounter (Signed)
OK refill. Can proceed with followup doppler to check on calf DVT since she has appt on 1/7 with [place] for followup. Continue xarelto until we get results back thanks

## 2018-02-14 NOTE — Telephone Encounter (Signed)
Ok for refill? 

## 2018-02-14 NOTE — Telephone Encounter (Signed)
lmtcb for flu shot 

## 2018-02-14 NOTE — Telephone Encounter (Signed)
I called patient and advised. Rx sent to pharmacy.  Selena Barnes--entered order for Vas [place] to F/U DVT Left Lower Extremity--patient has been on Xarelto and we are checking to see if peroneal vein DVT has resolved. I chose [facility] as facility, but patient is willing to go to another facility in [facility] if they are able to get her in sooner. Would like to have test done and results available for 02/19/2018 follow up appointment.

## 2018-02-15 ENCOUNTER — Ambulatory Visit (HOSPITAL_COMMUNITY)
Admission: RE | Admit: 2018-02-15 | Discharge: 2018-02-15 | Disposition: A | Discharge status: Home / Self Care | Payer: Medicare Other | Source: Ambulatory Visit | Attending: Orthopaedic Surgery | Admitting: Orthopaedic Surgery

## 2018-02-15 ENCOUNTER — Ambulatory Visit: Payer: Medicare Other | Attending: Orthopaedic Surgery | Admitting: *Deleted

## 2018-02-15 ENCOUNTER — Telehealth (INDEPENDENT_AMBULATORY_CARE_PROVIDER_SITE_OTHER): Payer: Self-pay

## 2018-02-15 DIAGNOSIS — R600 Localized edema: Secondary | ICD-10-CM | POA: Diagnosis not present

## 2018-02-15 DIAGNOSIS — I82452 Acute embolism and thrombosis of left peroneal vein: Secondary | ICD-10-CM | POA: Diagnosis not present

## 2018-02-15 DIAGNOSIS — M25562 Pain in left knee: Secondary | ICD-10-CM

## 2018-02-15 DIAGNOSIS — M25662 Stiffness of left knee, not elsewhere classified: Secondary | ICD-10-CM | POA: Diagnosis not present

## 2018-02-15 NOTE — Therapy (Signed)
[facility] [place], [name], [address] Phone: [phone]   Fax:  [phone]  Physical Therapy Treatment  Patient Details  Name: Selena Barnes MRN: [id number] Date of Birth: [birth year] Referring Provider (PT): Joni Fears MD   Encounter Date: 02/15/2018  PT End of Session - 02/15/18 0826    Visit Number  9    Number of Visits  16    Date for PT Re-Evaluation  03/20/18    Authorization Type  FOTO AT LEAST EVERY 5TH VISIT, 10TH VISIT PROGRESS NOTE AND KX MODIFIER AFTER THE 15 VISIT.    PT Start Time  0815    PT Stop Time  0903    PT Time Calculation (min)  48 min       Past Medical History:  Diagnosis Date  . Arthritis   . Constipation   . Depression   . GERD (gastroesophageal reflux disease)   . Hyperlipidemia   . Insomnia   . Migraines   . Osteopenia     Past Surgical History:  Procedure Laterality Date  . ABDOMINAL HYSTERECTOMY    . ANAL RECTAL MANOMETRY N/A 10/06/2015   Procedure: ANO RECTAL MANOMETRY;  Surgeon: Gatha Mayer, MD;  Location: [facility];  Service: Endoscopy;  Laterality: N/A;  . BREAST BIOPSY Right   . BREAST EXCISIONAL BIOPSY Right    benign  . CATARACT EXTRACTION W/ INTRAOCULAR LENS IMPLANT Bilateral 07/27/2016   and 09/27/2016, [employer] in [place]  . CATARACT EXTRACTION, BILATERAL    . CHOLECYSTECTOMY    . LYMPH NODE BIOPSY Left    left arm    There were no vitals filed for this visit.  Subjective Assessment - 02/15/18 0825    Subjective  Having an [place] of LT knee soon to assess blood clot and swelling    Patient is accompained by:  Family member    Pertinent History  Arhritis; Migraines; osteopenia.    Diagnostic tests  MRI and X-ray (See under "Imaging" tab)    Patient Stated Goals  Walk without pain.    Currently in Pain?  Yes    Pain Score  3     Pain Location  Knee    Pain Orientation  Left    Pain Descriptors / Indicators  Tightness    Pain Type   Surgical pain    Pain Onset  1 to 4 weeks ago    Pain Frequency  Constant                       [facility] Adult PT Treatment/Exercise - 02/15/18 0001      Knee/Hip Exercises: Aerobic   Nustep  L6, seat 7 x15 min      Knee/Hip Exercises: Seated   Long Arc Quad  Left;2 sets;10 reps      Knee/Hip Exercises: Supine   Short Arc Quad Sets  Left;10 reps;Strengthening;3 sets   2#   Bridges  --    Straight Leg Raises  AROM;Left;2 sets;10 reps    Straight Leg Raise with External Rotation  AROM;Left;2 sets;10 reps      Modalities   Modalities  Cryotherapy;Electrical Stimulation      Cryotherapy   Number Minutes Cryotherapy  15 Minutes    Cryotherapy Location  Knee    Type of Cryotherapy  Ice pack      Electrical Stimulation   Electrical Stimulation Location  L knee    Electrical Stimulation Action  IFC  Electrical Stimulation Parameters  80-150 hz x 15 mins    Electrical Stimulation Goals  Edema;Pain      Manual Therapy   Manual Therapy  Passive ROM;Joint mobilization    Joint Mobilization  patella mobs in all planes to improve mobility and ROM    Passive ROM  PROM of L knee into flexion                PT Short Term Goals - 11/13/17 1302      PT SHORT TERM GOAL #1   Title  STG's=LTG's.        PT Long Term Goals - 01/30/18 1242      PT LONG TERM GOAL #1   Title  independent with HEP    Time  8    Period  Weeks    Status  On-going      PT LONG TERM GOAL #2   Title  Full left active knee extension in order to normalize gait.    Time  8    Period  Weeks    Status  On-going   -11 degrees 01/30/18     PT LONG TERM GOAL #3   Title  Active knee flexion to 120-125 degrees+ so the patient can perform functional tasks and do so with pain not > 2-3/10.    Time  8    Period  Weeks    Status  On-going      PT LONG TERM GOAL #4   Title  Increase right knee strength to a solid 5/5 to provide good stability for accomplishment of functional activities.     Time  8    Period  Weeks    Status  On-going      PT LONG TERM GOAL #5   Title  Perform a reciprocating stair gait with one railing with pain not > 2-3/10.    Time  8    Period  Weeks    Status  On-going            Plan - 02/15/18 0827    Clinical Impression Statement  Pt arrived today reporting the Dr is ordering an [place] for LT knee to assess blood clot and swelling. She was able to perform therex for light strengthening and ROM, but limited in flexion due to pain. Circumferrence  LT knee was 40.5 cms Normal modality response.    Clinical Presentation  Stable    Rehab Potential  Excellent    PT Frequency  2x / week    PT Duration  8 weeks    PT Treatment/Interventions  ADLs/Self Care Home Management;Cryotherapy;Occupational psychologist;Therapeutic activities;Therapeutic exercise;Neuromuscular re-education;Patient/family education;Passive range of motion    PT Next Visit Plan  Left patellar mobs, Nustep with progression to bike, PROM, Pain-free strengthening exercise, electrical stimulation and CP.       Patient will benefit from skilled therapeutic intervention in order to improve the following deficits and impairments:  Pain, Abnormal gait, Decreased activity tolerance, Decreased strength, Decreased range of motion, Increased edema  Visit Diagnosis: Acute pain of left knee  Stiffness of left knee, not elsewhere classified  Localized edema     Problem List Patient Active Problem List   Diagnosis Date Noted  . Acute medial meniscal tear, left, initial encounter 12/07/2017  . Other cervical disc degeneration, unspecified cervical region 11/07/2017  . Constipation due to outlet dysfunction   . Osteopenia with high risk of fracture 02/05/2015  . BMI 26.0-26.9,adult 05/21/2014  . Heel  spur 05/21/2014  . Migraines 12/20/2012  . Insomnia 12/20/2012  . Depression 12/20/2012  . GERD (gastroesophageal reflux disease) 12/20/2012  . Hyperlipidemia  with target LDL less than 100 12/20/2012    ,CHRIS, PTA 02/15/2018, 9:12 AM  [facility] [place], [name], [address] Phone: [phone]   Fax:  [phone]  Name: Selena Barnes MRN: [id number] Date of Birth: [birth year]

## 2018-02-15 NOTE — Telephone Encounter (Signed)
Please see below and advise. We refilled Xarelto yesterday for patient.

## 2018-02-15 NOTE — Progress Notes (Signed)
Left lower extremity venous duplex has been completed. Negative for DVT. Results were given to April at Dr. Lorin Mercy' office.  02/15/18 2:07 PM Carlos Levering RVT

## 2018-02-15 NOTE — Telephone Encounter (Signed)
Per Marya Amsler with Vascular, patient is Negative for DVT, Left LE.

## 2018-02-15 NOTE — Telephone Encounter (Signed)
I called patient.ok to stop xarelto

## 2018-02-15 NOTE — Telephone Encounter (Signed)
noted 

## 2018-02-18 ENCOUNTER — Ambulatory Visit: Payer: Medicare Other | Admitting: Physical Therapy

## 2018-02-18 ENCOUNTER — Encounter: Payer: Self-pay | Admitting: Physical Therapy

## 2018-02-18 DIAGNOSIS — M25662 Stiffness of left knee, not elsewhere classified: Secondary | ICD-10-CM | POA: Diagnosis not present

## 2018-02-18 DIAGNOSIS — R600 Localized edema: Secondary | ICD-10-CM

## 2018-02-18 DIAGNOSIS — M25562 Pain in left knee: Secondary | ICD-10-CM

## 2018-02-18 NOTE — Therapy (Signed)
[facility] [place], [name], [address] Phone: [phone]   Fax:  [phone]  Physical Therapy Treatment  Progress Note Reporting Period 01/23/2018 to 02/18/2018  See note below for Objective Data and Assessment of Progress/Goals.      Patient Details  Name: Selena Barnes MRN: [id number] Date of Birth: 03/05/1950 Referring Provider (PT): Joni Fears MD   Encounter Date: 02/18/2018  PT End of Session - 02/18/18 0954    Visit Number  10    Date for PT Re-Evaluation  03/20/18    Authorization Type  FOTO AT LEAST EVERY 5TH VISIT, 10TH VISIT PROGRESS NOTE AND KX MODIFIER AFTER THE 15 VISIT.    PT Start Time  0900    PT Stop Time  1004    PT Time Calculation (min)  64 min    Activity Tolerance  Patient tolerated treatment well    Behavior During Therapy  WFL for tasks assessed/performed       Past Medical History:  Diagnosis Date  . Arthritis   . Constipation   . Depression   . GERD (gastroesophageal reflux disease)   . Hyperlipidemia   . Insomnia   . Migraines   . Osteopenia     Past Surgical History:  Procedure Laterality Date  . ABDOMINAL HYSTERECTOMY    . ANAL RECTAL MANOMETRY N/A 10/06/2015   Procedure: ANO RECTAL MANOMETRY;  Surgeon: Gatha Mayer, MD;  Location: [facility];  Service: Endoscopy;  Laterality: N/A;  . BREAST BIOPSY Right   . BREAST EXCISIONAL BIOPSY Right    benign  . CATARACT EXTRACTION W/ INTRAOCULAR LENS IMPLANT Bilateral 07/27/2016   and 09/27/2016, [employer] in [place]  . CATARACT EXTRACTION, BILATERAL    . CHOLECYSTECTOMY    . LYMPH NODE BIOPSY Left    left arm    There were no vitals filed for this visit.  Subjective Assessment - 02/18/18 0903    Subjective  Patient had [place] and blood clot is gone yet has ongoing pain and swelling    Pertinent History  Arhritis; Migraines; osteopenia.    Diagnostic tests  MRI and X-ray (See under "Imaging" tab)     Patient Stated Goals  Walk without pain.    Currently in Pain?  Yes    Pain Score  3     Pain Location  Knee    Pain Orientation  Left    Pain Descriptors / Indicators  Tightness    Pain Type  Surgical pain    Pain Onset  More than a month ago    Pain Frequency  Constant    Aggravating Factors   standing and walking    Pain Relieving Factors  rest         [facility] PT Assessment - 02/18/18 0001      AROM   AROM Assessment Site  Knee    Right/Left Knee  Right    Right Knee Flexion  106    Left Knee Extension  -6      PROM   PROM Assessment Site  Knee    Right/Left Knee  Right    Left Knee Extension  -3    Left Knee Flexion  111                   [facility] Adult PT Treatment/Exercise - 02/18/18 0001      Knee/Hip Exercises: Aerobic   Nustep  L6, seat 7 x15 min UE/LE activity  Knee/Hip Exercises: Seated   Long Arc Quad  Left;2 sets;10 reps    Long Arc Quad Weight  2 lbs.      Knee/Hip Exercises: Supine   Short Arc Quad Sets  Left;10 reps;Strengthening;3 sets    Short Arc Quad Sets Limitations  2#    Heel Slides  AROM;Left;15 reps    Bridges  Both;2 sets;10 reps    Straight Leg Raises  AROM;Left;2 sets;10 reps    Straight Leg Raise with External Rotation  AROM;Left;2 sets;10 reps      Knee/Hip Exercises: Sidelying   Hip ABduction  Strengthening;Left;2 sets;10 reps      Cryotherapy   Number Minutes Cryotherapy  15 Minutes    Cryotherapy Location  Knee    Type of Cryotherapy  Ice pack      Electrical Stimulation   Electrical Stimulation Location  L knee    Electrical Stimulation Action  IFC    Electrical Stimulation Parameters  1-10hz  x85min    Electrical Stimulation Goals  Edema;Pain      Manual Therapy   Manual Therapy  Soft tissue mobilization;Passive ROM    Manual therapy comments  STW to right knee lateral side to reduce pain and tightness    Joint Mobilization  patella mobs in all planes to improve mobility and ROM    Passive ROM  PROM of L  knee into flexion and ext with gentle holds               PT Short Term Goals - 11/13/17 1302      PT SHORT TERM GOAL #1   Title  STG's=LTG's.        PT Long Term Goals - 02/18/18 0911      PT LONG TERM GOAL #1   Title  independent with HEP    Time  8    Period  Weeks    Status  On-going      PT LONG TERM GOAL #2   Title  Full left active knee extension in order to normalize gait.    Time  8    Period  Weeks    Status  On-going   AROM -6 degrees 02/18/18     PT LONG TERM GOAL #3   Title  Active knee flexion to 120-125 degrees+ so the patient can perform functional tasks and do so with pain not > 2-3/10.    Time  8    Period  Weeks    Status  On-going   AROM 106 degrees 02/18/18     PT LONG TERM GOAL #4   Title  Increase right knee strength to a solid 5/5 to provide good stability for accomplishment of functional activities.    Time  8    Period  Weeks    Status  On-going   NT 02/18/18     PT LONG TERM GOAL #5   Title  Perform a reciprocating stair gait with one railing with pain not > 2-3/10.    Time  8    Period  Weeks    Status  On-going            Plan - 02/18/18 0955    Clinical Impression Statement  Patient tolerated treatment well today and able to progress exercises for left LE. Patient reported she went to MD and the Blood clot is gone. Today continued with POC for ROM and strengthening due to ongoin pain and swelling in knee that may be due to her gout  flare up. Patient has improved ROM for right knee flexion and ext. goals are progressing yet ongoing.     Rehab Potential  Excellent    PT Frequency  2x / week    PT Duration  8 weeks    PT Treatment/Interventions  ADLs/Self Care Home Management;Cryotherapy;Occupational psychologist;Therapeutic activities;Therapeutic exercise;Neuromuscular re-education;Patient/family education;Passive range of motion    PT Next Visit Plan  Left patellar mobs, Nustep with progression to  bike, PROM, Pain-free strengthening exercise, electrical stimulation and CP.    Consulted and Agree with Plan of Care  Patient       Patient will benefit from skilled therapeutic intervention in order to improve the following deficits and impairments:  Pain, Abnormal gait, Decreased activity tolerance, Decreased strength, Decreased range of motion, Increased edema  Visit Diagnosis: Acute pain of left knee  Stiffness of left knee, not elsewhere classified  Localized edema     Problem List Patient Active Problem List   Diagnosis Date Noted  . Acute medial meniscal tear, left, initial encounter 12/07/2017  . Other cervical disc degeneration, unspecified cervical region 11/07/2017  . Constipation due to outlet dysfunction   . Osteopenia with high risk of fracture 02/05/2015  . BMI 26.0-26.9,adult 05/21/2014  . Heel spur 05/21/2014  . Migraines 12/20/2012  . Insomnia 12/20/2012  . Depression 12/20/2012  . GERD (gastroesophageal reflux disease) 12/20/2012  . Hyperlipidemia with target LDL less than 100 12/20/2012    Ladean Raya, PTA 02/18/18 10:07 AM  [facility] [place], [name], [address] Phone: [phone]   Fax:  [phone]  Name: KAYTE BORCHARD MRN: [id number] Date of Birth: 26-Dec-1950

## 2018-02-19 ENCOUNTER — Encounter (INDEPENDENT_AMBULATORY_CARE_PROVIDER_SITE_OTHER): Payer: Self-pay | Admitting: Orthopaedic Surgery

## 2018-02-19 ENCOUNTER — Ambulatory Visit (INDEPENDENT_AMBULATORY_CARE_PROVIDER_SITE_OTHER): Payer: Medicare Other | Admitting: Orthopaedic Surgery

## 2018-02-19 VITALS — BP 120/75 | HR 90 | Ht 64.0 in | Wt 149.0 lb

## 2018-02-19 DIAGNOSIS — M25562 Pain in left knee: Secondary | ICD-10-CM

## 2018-02-19 NOTE — Progress Notes (Signed)
Post-Op Visit Note   Patient: Selena Barnes           Date of Birth: Feb 18, 1950           MRN: [id number] Visit Date: 02/19/2018 PCP: Dettinger, Fransisca Kaufmann, MD   Assessment & Plan: Postop partial meniscectomy with peroneal vein DVT treated with Xarelto now with a negative Doppler and Xarelto has been stopped.  She continues to have difficulty with gait slow stride gait minimal knee swelling and flexion.  Swelling is down  Chief Complaint:  Chief Complaint  Patient presents with  . Left Knee - Follow-up    12/31/17 Left Knee Arthroscopy   Visit Diagnoses:  1. Left knee pain, unspecified chronicity            Post partial meniscectomy with postop DVT treated with Xarelto with resolution.  Last Doppler test was negative.  Plan: Continue therapy work on walking she can use a cane in her right hand and try to increase her stride length and sequence as well as speed.  She can do some leg lifts using her purse strap across her ankle as a counterweight.  I will recheck her in 6 weeks.  Continue therapy.  Follow-Up Instructions: No follow-ups on file.   Orders:  No orders of the defined types were placed in this encounter.  No orders of the defined types were placed in this encounter.   Imaging: No results found.  PMFS History: Patient Active Problem List   Diagnosis Date Noted  . Acute medial meniscal tear, left, initial encounter 12/07/2017  . Other cervical disc degeneration, unspecified cervical region 11/07/2017  . Constipation due to outlet dysfunction   . Osteopenia with high risk of fracture 02/05/2015  . BMI 26.0-26.9,adult 05/21/2014  . Heel spur 05/21/2014  . Migraines 12/20/2012  . Insomnia 12/20/2012  . Depression 12/20/2012  . GERD (gastroesophageal reflux disease) 12/20/2012  . Hyperlipidemia with target LDL less than 100 12/20/2012   Past Medical History:  Diagnosis Date  . Arthritis   . Constipation   . Depression   . GERD (gastroesophageal reflux  disease)   . Hyperlipidemia   . Insomnia   . Migraines   . Osteopenia     Family History  Problem Relation Age of Onset  . Heart disease Mother 71       cause of death  . Heart disease Father 69       death  . Kidney disease Father   . Diabetes Brother   . Cancer Brother 70       bone marrow cancer  . Diabetes Grandchild        grandson    Past Surgical History:  Procedure Laterality Date  . ABDOMINAL HYSTERECTOMY    . ANAL RECTAL MANOMETRY N/A 10/06/2015   Procedure: ANO RECTAL MANOMETRY;  Surgeon: Gatha Mayer, MD;  [facility]: [facility];  Service: Endoscopy;  Laterality: N/A;  . BREAST BIOPSY Right   . BREAST EXCISIONAL BIOPSY Right    benign  . CATARACT EXTRACTION W/ INTRAOCULAR LENS IMPLANT Bilateral 07/27/2016   and 09/27/2016, [employer] in [place]  . CATARACT EXTRACTION, BILATERAL    . CHOLECYSTECTOMY    . LYMPH NODE BIOPSY Left    left arm   Social History   Occupational History  . Occupation: retired  Tobacco Use  . Smoking status: Never Smoker  . Smokeless tobacco: Never Used  Substance and Sexual Activity  . Alcohol use: No    Alcohol/week:  0.0 standard drinks  . Drug use: No  . Sexual activity: Yes

## 2018-02-20 ENCOUNTER — Encounter: Payer: Self-pay | Admitting: Physical Therapy

## 2018-02-20 ENCOUNTER — Ambulatory Visit: Payer: Medicare Other | Admitting: Physical Therapy

## 2018-02-20 DIAGNOSIS — M25562 Pain in left knee: Secondary | ICD-10-CM

## 2018-02-20 DIAGNOSIS — R600 Localized edema: Secondary | ICD-10-CM

## 2018-02-20 DIAGNOSIS — M25662 Stiffness of left knee, not elsewhere classified: Secondary | ICD-10-CM | POA: Diagnosis not present

## 2018-02-20 NOTE — Therapy (Signed)
[facility] [place], [name], [address] Phone: [phone]   Fax:  [phone]  Physical Therapy Treatment  Patient Details  Name: Selena Barnes MRN: [id number] Date of Birth: Jun 18, 1950 Referring Provider (PT): Joni Fears MD   Encounter Date: 02/20/2018  PT End of Session - 02/20/18 0841    Visit Number  11    Number of Visits  16    Date for PT Re-Evaluation  03/20/18    Authorization Type  FOTO AT LEAST EVERY 5TH VISIT, 10TH VISIT PROGRESS NOTE AND KX MODIFIER AFTER THE 15 VISIT.    PT Start Time  0815    PT Stop Time  0909    PT Time Calculation (min)  54 min    Activity Tolerance  Patient tolerated treatment well    Behavior During Therapy  [facility] for tasks assessed/performed       Past Medical History:  Diagnosis Date  . Arthritis   . Constipation   . Depression   . GERD (gastroesophageal reflux disease)   . Hyperlipidemia   . Insomnia   . Migraines   . Osteopenia     Past Surgical History:  Procedure Laterality Date  . ABDOMINAL HYSTERECTOMY    . ANAL RECTAL MANOMETRY N/A 10/06/2015   Procedure: ANO RECTAL MANOMETRY;  Surgeon: Gatha Mayer, MD;  Location: [facility];  Service: Endoscopy;  Laterality: N/A;  . BREAST BIOPSY Right   . BREAST EXCISIONAL BIOPSY Right    benign  . CATARACT EXTRACTION W/ INTRAOCULAR LENS IMPLANT Bilateral 07/27/2016   and 09/27/2016, [employer] in [place]  . CATARACT EXTRACTION, BILATERAL    . CHOLECYSTECTOMY    . LYMPH NODE BIOPSY Left    left arm    There were no vitals filed for this visit.  Subjective Assessment - 02/20/18 0833    Subjective  Patient reports MD appointment went well but needs to work on gait. Patient was instructed to use a cane for 2-3 weeks to normalize gait.    Pertinent History  Arhritis; Migraines; osteopenia.    Diagnostic tests  MRI and X-ray (See under "Imaging" tab)    Patient Stated Goals  Walk without pain.    Currently in Pain?  Yes    Pain Score  2     Pain Location  Knee    Pain Orientation  Left    Pain Descriptors / Indicators  Sore    Pain Type  Surgical pain    Pain Onset  More than a month ago    Pain Frequency  Constant         [facility] PT Assessment - 02/20/18 0001      Assessment   Medical Diagnosis  Acute medial meniscus tear, left.                   [facility] Adult PT Treatment/Exercise - 02/20/18 0001      Knee/Hip Exercises: Stretches   ITB Stretch  Left;3 reps;20 seconds    ITB Stretch Limitations  supine with strap      Knee/Hip Exercises: Aerobic   Stationary Bike  AAROM 15 minutes seat 5      Knee/Hip Exercises: Standing   Heel Raises  Both;2 sets;10 reps    Hip Abduction  AROM;Stengthening;Both;2 sets;10 reps;Knee straight    Rocker Board  3 minutes      Knee/Hip Exercises: Seated   Long Arc Quad  Left;2 sets;10 reps    Long [employer]  Weight  2 lbs.    Hamstring Curl  --      Knee/Hip Exercises: Supine   Straight Leg Raises  AROM;Left;15 reps    Straight Leg Raises Limitations  2#    Straight Leg Raise with External Rotation  AROM;Left;15 reps    Straight Leg Raise with External Rotation Limitations  2#      Cryotherapy   Number Minutes Cryotherapy  15 Minutes    Cryotherapy Location  Knee    Type of Cryotherapy  Ice pack      Electrical Stimulation   Electrical Stimulation Location  L knee    Electrical Stimulation Action  IFC    Electrical Stimulation Parameters  1-10 hz x15 min    Electrical Stimulation Goals  Edema;Pain      Manual Therapy   Manual Therapy  Soft tissue mobilization;Passive ROM    Manual therapy comments  STW to right knee lateral side to reduce pain and tightness    Joint Mobilization  patella mobs in all planes to improve mobility and ROM    Passive ROM  PROM of L knee into flexion and ext with gentle holds               PT Short Term Goals - 11/13/17 1302      PT SHORT TERM GOAL #1   Title  STG's=LTG's.         PT Long Term Goals - 02/18/18 0911      PT LONG TERM GOAL #1   Title  independent with HEP    Time  8    Period  Weeks    Status  On-going      PT LONG TERM GOAL #2   Title  Full left active knee extension in order to normalize gait.    Time  8    Period  Weeks    Status  On-going   AROM -6 degrees 02/18/18     PT LONG TERM GOAL #3   Title  Active knee flexion to 120-125 degrees+ so the patient can perform functional tasks and do so with pain not > 2-3/10.    Time  8    Period  Weeks    Status  On-going   AROM 106 degrees 02/18/18     PT LONG TERM GOAL #4   Title  Increase right knee strength to a solid 5/5 to provide good stability for accomplishment of functional activities.    Time  8    Period  Weeks    Status  On-going   NT 02/18/18     PT LONG TERM GOAL #5   Title  Perform a reciprocating stair gait with one railing with pain not > 2-3/10.    Time  8    Period  Weeks    Status  On-going            Plan - 02/20/18 9470    Clinical Impression Statement  Patient was able to tolerate progression of exercises well. Patient denied any increase of pain, just fatigue and soreness. Patient demonstrates decreased gait speed, decreased left stance time, and decreased knee flexion during swing. Normal response upon removal.     Clinical Presentation  Stable    Clinical Decision Making  Low    Rehab Potential  Excellent    PT Frequency  2x / week    PT Duration  8 weeks    PT Treatment/Interventions  ADLs/Self Care Home Management;Cryotherapy;Occupational psychologist;Therapeutic  activities;Therapeutic exercise;Neuromuscular re-education;Patient/family education;Passive range of motion    PT Next Visit Plan  Left patellar mobs, Nustep with progression to bike, PROM, Pain-free strengthening exercise, electrical stimulation and CP.    Consulted and Agree with Plan of Care  Patient       Patient will benefit from skilled therapeutic  intervention in order to improve the following deficits and impairments:  Pain, Abnormal gait, Decreased activity tolerance, Decreased strength, Decreased range of motion, Increased edema  Visit Diagnosis: Acute pain of left knee  Stiffness of left knee, not elsewhere classified  Localized edema     Problem List Patient Active Problem List   Diagnosis Date Noted  . Acute medial meniscal tear, left, initial encounter 12/07/2017  . Other cervical disc degeneration, unspecified cervical region 11/07/2017  . Constipation due to outlet dysfunction   . Osteopenia with high risk of fracture 02/05/2015  . BMI 26.0-26.9,adult 05/21/2014  . Heel spur 05/21/2014  . Migraines 12/20/2012  . Insomnia 12/20/2012  . Depression 12/20/2012  . GERD (gastroesophageal reflux disease) 12/20/2012  . Hyperlipidemia with target LDL less than 100 12/20/2012   Gabriela Eves, PT, DPT 02/20/2018, 9:22 AM  [facility] [place], [name], [address] Phone: [phone]   Fax:  [phone]  Name: Selena Barnes MRN: [id number] Date of Birth: 19-Jun-1950

## 2018-02-22 ENCOUNTER — Encounter: Payer: Self-pay | Admitting: Physical Therapy

## 2018-02-22 ENCOUNTER — Ambulatory Visit: Payer: Medicare Other | Admitting: Physical Therapy

## 2018-02-22 DIAGNOSIS — M25562 Pain in left knee: Secondary | ICD-10-CM

## 2018-02-22 DIAGNOSIS — M25662 Stiffness of left knee, not elsewhere classified: Secondary | ICD-10-CM | POA: Diagnosis not present

## 2018-02-22 DIAGNOSIS — R600 Localized edema: Secondary | ICD-10-CM

## 2018-02-22 NOTE — Therapy (Signed)
[facility] [place], [name], [address] Phone: [phone]   Fax:  [phone]  Physical Therapy Treatment  Patient Details  Name: Selena Barnes MRN: [id number] Date of Birth: 06/27/1950 Referring Provider (PT): Rodell Perna MD   Encounter Date: 02/22/2018  PT End of Session - 02/22/18 0824    Visit Number  12    Number of Visits  16    Date for PT Re-Evaluation  03/20/18    Authorization Type  FOTO AT LEAST EVERY 5TH VISIT, 10TH VISIT PROGRESS NOTE AND KX MODIFIER AFTER THE 15 VISIT.    PT Start Time  [phone]    PT Stop Time  0912    PT Time Calculation (min)  55 min    Activity Tolerance  Patient tolerated treatment well    Behavior During Therapy  [facility] for tasks assessed/performed       Past Medical History:  Diagnosis Date  . Arthritis   . Constipation   . Depression   . GERD (gastroesophageal reflux disease)   . Hyperlipidemia   . Insomnia   . Migraines   . Osteopenia     Past Surgical History:  Procedure Laterality Date  . ABDOMINAL HYSTERECTOMY    . ANAL RECTAL MANOMETRY N/A 10/06/2015   Procedure: ANO RECTAL MANOMETRY;  Surgeon: Gatha Mayer, MD;  Location: [facility];  Service: Endoscopy;  Laterality: N/A;  . BREAST BIOPSY Right   . BREAST EXCISIONAL BIOPSY Right    benign  . CATARACT EXTRACTION W/ INTRAOCULAR LENS IMPLANT Bilateral 07/27/2016   and 09/27/2016, [employer] in [place]  . CATARACT EXTRACTION, BILATERAL    . CHOLECYSTECTOMY    . LYMPH NODE BIOPSY Left    left arm    There were no vitals filed for this visit.  Subjective Assessment - 02/22/18 0823    Subjective  Reports increased pain today and reports that her knee has buckled twice since last visit while walking in her home. No longer taking xarelto.    Pertinent History  Arhritis; Migraines; osteopenia.    Diagnostic tests  MRI and X-ray (See under "Imaging" tab)    Patient Stated Goals  Walk without pain.     Currently in Pain?  Yes    Pain Score  5     Pain Location  Knee    Pain Orientation  Left    Pain Descriptors / Indicators  Discomfort    Pain Type  Surgical pain    Pain Onset  More than a month ago         [facility] PT Assessment - 02/22/18 0001      Assessment   Medical Diagnosis  Acute medial meniscus tear, left.    Referring Provider (PT)  Rodell Perna MD    Hand Dominance  Right    Next MD Visit  03/22/2018      Precautions   Precautions  Other (comment)    Precaution Comments  No vasopneumatic.      ROM / Strength   AROM / PROM / Strength  AROM      AROM   Overall AROM   Within functional limits for tasks performed    AROM Assessment Site  Knee    Right/Left Knee  Left    Left Knee Extension  0    Left Knee Flexion  115                   [facility] Adult PT Treatment/Exercise -  02/22/18 0001      Knee/Hip Exercises: Aerobic   Stationary Bike  Seat 6 x15 min      Knee/Hip Exercises: Standing   Heel Raises  Both;2 sets;10 reps    Forward Lunges  Left;15 reps;2 seconds    Hip Abduction  AROM;Stengthening;Both;2 sets;10 reps;Knee straight    Rocker Board  3 minutes      Knee/Hip Exercises: Seated   Long Arc Quad  Left;2 sets;10 reps    Long Arc Quad Weight  2 lbs.      Knee/Hip Exercises: Supine   Heel Slides  AROM;Left;15 reps    Straight Leg Raises  AROM;Left;2 sets;10 reps    Straight Leg Raise with External Rotation  AROM;Left;2 sets;10 reps      Modalities   Modalities  Cryotherapy;Electrical Stimulation      Cryotherapy   Number Minutes Cryotherapy  15 Minutes    Cryotherapy Location  Knee    Type of Cryotherapy  Ice pack      Electrical Stimulation   Electrical Stimulation Location  L knee    Electrical Stimulation Action  IFC    Electrical Stimulation Parameters  80-150 hz x15 min    Electrical Stimulation Goals  Edema;Pain      Manual Therapy   Manual Therapy  Soft tissue mobilization    Soft tissue mobilization  L patella mobs in  sup/inf, med/lat, tilts to promote improved mobility             PT Education - 02/22/18 0847    Education Details  HEP- SLR, SLR with ER, LAQ    Person(s) Educated  Patient    Methods  Explanation;Handout    Comprehension  Verbalized understanding       PT Short Term Goals - 11/13/17 1302      PT SHORT TERM GOAL #1   Title  STG's=LTG's.        PT Long Term Goals - 02/22/18 0844      PT LONG TERM GOAL #1   Title  independent with HEP    Time  8    Period  Weeks    Status  On-going      PT LONG TERM GOAL #2   Title  Full left active knee extension in order to normalize gait.    Time  8    Period  Weeks    Status  Achieved      PT LONG TERM GOAL #3   Title  Active knee flexion to 120-125 degrees+ so the patient can perform functional tasks and do so with pain not > 2-3/10.    Time  8    Period  Weeks    Status  Partially Met   AROM 115 degrees 02/22/18     PT LONG TERM GOAL #4   Title  Increase right knee strength to a solid 5/5 to provide good stability for accomplishment of functional activities.    Time  8    Period  Weeks    Status  On-going   NT 02/18/18     PT LONG TERM GOAL #5   Title  Perform a reciprocating stair gait with one railing with pain not > 2-3/10.    Time  8    Period  Weeks    Status  On-going            Plan - 02/22/18 0909    Clinical Impression Statement  Patient presented in clinic with reports of increased pain  and of knee buckling during ambulation. Patient ambulated into clinic with [place]. Patient initially limited with knee flexion on bike but able to complete full revolutions. No complaints during any exercises and patient educated regarding quad weakness and buckling. Patient provided new HEP for quad strengthening at home and encouraged patient to keep AD close to avoid buckling due to fatigue. Improved med/lat patella mobility but still limited with sup/inf directions. Pitting still present surrounding the L patella during  patella mobilizations with edema. Normal modalities response noted following removal of the modalities.    Rehab Potential  Excellent    PT Frequency  2x / week    PT Duration  8 weeks    PT Treatment/Interventions  ADLs/Self Care Home Management;Cryotherapy;Occupational psychologist;Therapeutic activities;Therapeutic exercise;Neuromuscular re-education;Patient/family education;Passive range of motion    PT Next Visit Plan  Left patellar mobs, Nustep with progression to bike, PROM, Pain-free strengthening exercise, electrical stimulation and CP.    Consulted and Agree with Plan of Care  Patient       Patient will benefit from skilled therapeutic intervention in order to improve the following deficits and impairments:  Pain, Abnormal gait, Decreased activity tolerance, Decreased strength, Decreased range of motion, Increased edema  Visit Diagnosis: Acute pain of left knee  Stiffness of left knee, not elsewhere classified  Localized edema     Problem List Patient Active Problem List   Diagnosis Date Noted  . Acute medial meniscal tear, left, initial encounter 12/07/2017  . Other cervical disc degeneration, unspecified cervical region 11/07/2017  . Constipation due to outlet dysfunction   . Osteopenia with high risk of fracture 02/05/2015  . BMI 26.0-26.9,adult 05/21/2014  . Heel spur 05/21/2014  . Migraines 12/20/2012  . Insomnia 12/20/2012  . Depression 12/20/2012  . GERD (gastroesophageal reflux disease) 12/20/2012  . Hyperlipidemia with target LDL less than 100 12/20/2012    Standley Brooking, PTA 02/22/2018, 9:18 AM  [facility] [place], [name], [address] Phone: [phone]   Fax:  [phone]  Name: LILYANA LIPPMAN MRN: [id number] Date of Birth: [birth year]

## 2018-02-25 ENCOUNTER — Ambulatory Visit: Payer: Medicare Other | Admitting: Physical Therapy

## 2018-02-25 ENCOUNTER — Encounter: Payer: Self-pay | Admitting: Physical Therapy

## 2018-02-25 DIAGNOSIS — M25662 Stiffness of left knee, not elsewhere classified: Secondary | ICD-10-CM

## 2018-02-25 DIAGNOSIS — R600 Localized edema: Secondary | ICD-10-CM

## 2018-02-25 DIAGNOSIS — M25562 Pain in left knee: Secondary | ICD-10-CM | POA: Diagnosis not present

## 2018-02-25 NOTE — Therapy (Addendum)
[facility] [place], [name], [address] Phone: [phone]   Fax:  [phone]  Physical Therapy Treatment  Patient Details  Name: Selena Barnes MRN: [id number] Date of Birth: [birth year] Referring Provider (PT): Rodell Perna MD   Encounter Date: 02/25/2018  PT End of Session - 02/25/18 0851    Visit Number  13    Number of Visits  20    Date for PT Re-Evaluation  04/05/18    Authorization Type  FOTO AT LEAST EVERY 5TH VISIT, 10TH VISIT PROGRESS NOTE AND KX MODIFIER AFTER THE 15 VISIT.    PT Start Time  0814    PT Stop Time  0904    PT Time Calculation (min)  50 min    Activity Tolerance  Patient tolerated treatment well    Behavior During Therapy  [facility] for tasks assessed/performed       Past Medical History:  Diagnosis Date  . Arthritis   . Constipation   . Depression   . GERD (gastroesophageal reflux disease)   . Hyperlipidemia   . Insomnia   . Migraines   . Osteopenia     Past Surgical History:  Procedure Laterality Date  . ABDOMINAL HYSTERECTOMY    . ANAL RECTAL MANOMETRY N/A 10/06/2015   Procedure: ANO RECTAL MANOMETRY;  Surgeon: Gatha Mayer, MD;  Location: [facility];  Service: Endoscopy;  Laterality: N/A;  . BREAST BIOPSY Right   . BREAST EXCISIONAL BIOPSY Right    benign  . CATARACT EXTRACTION W/ INTRAOCULAR LENS IMPLANT Bilateral 07/27/2016   and 09/27/2016, [employer] in [place]  . CATARACT EXTRACTION, BILATERAL    . CHOLECYSTECTOMY    . LYMPH NODE BIOPSY Left    left arm    There were no vitals filed for this visit.  Subjective Assessment - 02/25/18 0819    Subjective  Patient arrived and reported "some better" and did good after last treatment    Pertinent History  Arhritis; Migraines; osteopenia.    Diagnostic tests  MRI and X-ray (See under "Imaging" tab)    Patient Stated Goals  Walk without pain.    Currently in Pain?  Yes    Pain Score  3     Pain Location  Knee     Pain Orientation  Left    Pain Descriptors / Indicators  Discomfort    Pain Type  Surgical pain    Pain Onset  More than a month ago    Pain Frequency  Constant    Aggravating Factors   standing/walking    Pain Relieving Factors  at rest         [facility] PT Assessment - 02/25/18 0001      AROM   AROM Assessment Site  Knee    Right/Left Knee  Left    Left Knee Extension  0    Left Knee Flexion  113                   [facility] Adult PT Treatment/Exercise - 02/25/18 0001      Knee/Hip Exercises: Stretches   Knee: Self-Stretch to increase Flexion  3 reps;Left;10 seconds      Knee/Hip Exercises: Aerobic   Stationary Bike  Seat L1 6 x15 min (L2 the last 5 min)      Knee/Hip Exercises: Standing   Hip Abduction  AROM;Stengthening;Both;2 sets;10 reps;Knee straight    Hip Extension  Stengthening;Both;2 sets;10 reps    Rocker Board  3 minutes  Knee/Hip Exercises: Seated   Long Arc Quad  Left;2 sets;10 reps    Long Arc Quad Weight  2 lbs.      Knee/Hip Exercises: Supine   Straight Leg Raises  AROM;Left;2 sets;10 reps    Straight Leg Raises Limitations  2#    Straight Leg Raise with External Rotation  AROM;Left;2 sets;10 reps    Straight Leg Raise with External Rotation Limitations  2#      Cryotherapy   Number Minutes Cryotherapy  15 Minutes    Cryotherapy Location  Knee    Type of Cryotherapy  Ice pack      Electrical Stimulation   Electrical Stimulation Location  L knee    Electrical Stimulation Action  IFC    Electrical Stimulation Parameters  80-150hz  x42min    Electrical Stimulation Goals  Edema;Pain               PT Short Term Goals - 11/13/17 1302      PT SHORT TERM GOAL #1   Title  STG's=LTG's.        PT Long Term Goals - 02/25/18 0998      PT LONG TERM GOAL #1   Title  independent with HEP    Time  8    Period  Weeks    Status  On-going      PT LONG TERM GOAL #2   Title  Full left active knee extension in order to normalize gait.     Time  8    Period  Weeks    Status  Achieved      PT LONG TERM GOAL #3   Title  Active knee flexion to 120-125 degrees+ so the patient can perform functional tasks and do so with pain not > 2-3/10.    Time  8    Period  Weeks    Status  On-going   AROM 113 degrees 02/25/18     PT LONG TERM GOAL #4   Title  Increase right knee strength to a solid 5/5 to provide good stability for accomplishment of functional activities.    Time  8    Period  Weeks    Status  On-going   NT 02/25/18     PT LONG TERM GOAL #5   Title  Perform a reciprocating stair gait with one railing with pain not > 2-3/10.    Time  8    Period  Weeks    Status  On-going            Plan - 02/25/18 3382    Clinical Impression Statement  Patient tolerated treatment well today. Patient able to progress with gentle left LE strengthening. Patient has less discomfort yet ongoing pain and swelling in left knee. Patient has lack of flexion due to swelling. Goals progressing. F/U sent for recert.    Rehab Potential  Excellent    PT Frequency  2x / week    PT Duration  8 weeks    PT Treatment/Interventions  ADLs/Self Care Home Management;Cryotherapy;Occupational psychologist;Therapeutic activities;Therapeutic exercise;Neuromuscular re-education;Patient/family education;Passive range of motion;Vasopneumatic Device;Taping    PT Next Visit Plan  Left patellar mobs, Nustep with progression to bike, PROM, Pain-free strengthening exercise, electrical stimulation and CP. F/U sent awaiting signature    Consulted and Agree with Plan of Care  Patient       Patient will benefit from skilled therapeutic intervention in order to improve the following deficits and impairments:  Pain, Abnormal gait, Decreased  activity tolerance, Decreased strength, Decreased range of motion, Increased edema  Visit Diagnosis: Acute pain of left knee  Stiffness of left knee, not elsewhere classified  Localized  edema     Problem List Patient Active Problem List   Diagnosis Date Noted  . Acute medial meniscal tear, left, initial encounter 12/07/2017  . Other cervical disc degeneration, unspecified cervical region 11/07/2017  . Constipation due to outlet dysfunction   . Osteopenia with high risk of fracture 02/05/2015  . BMI 26.0-26.9,adult 05/21/2014  . Heel spur 05/21/2014  . Migraines 12/20/2012  . Insomnia 12/20/2012  . Depression 12/20/2012  . GERD (gastroesophageal reflux disease) 12/20/2012  . Hyperlipidemia with target LDL less than 100 12/20/2012    Ladean Raya, PTA 02/25/18 9:24 AM  [facility] [place], [name], [address] Phone: [phone]   Fax:  [phone]  Name: Selena Barnes MRN: [id number] Date of Birth: May 23, 1950

## 2018-02-27 ENCOUNTER — Encounter: Payer: Self-pay | Admitting: Physical Therapy

## 2018-02-27 ENCOUNTER — Ambulatory Visit: Payer: Medicare Other | Admitting: Physical Therapy

## 2018-02-27 DIAGNOSIS — M25562 Pain in left knee: Secondary | ICD-10-CM

## 2018-02-27 DIAGNOSIS — R600 Localized edema: Secondary | ICD-10-CM | POA: Diagnosis not present

## 2018-02-27 DIAGNOSIS — M25662 Stiffness of left knee, not elsewhere classified: Secondary | ICD-10-CM | POA: Diagnosis not present

## 2018-02-27 NOTE — Therapy (Signed)
[facility] [place], [name], [address] Phone: [phone]   Fax:  [phone]  Physical Therapy Treatment  Patient Details  Name: Selena Barnes MRN: [id number] Date of Birth: [birth year] Referring Provider (PT): Rodell Perna MD   Encounter Date: 02/27/2018  PT End of Session - 02/27/18 0820    Visit Number  14    Number of Visits  20    Date for PT Re-Evaluation  04/05/18    Authorization Type  FOTO AT LEAST EVERY 5TH VISIT, 10TH VISIT PROGRESS NOTE AND KX MODIFIER AFTER THE 15 VISIT.    PT Start Time  0819    PT Stop Time  0909    PT Time Calculation (min)  50 min    Activity Tolerance  Patient tolerated treatment well    Behavior During Therapy  [facility] for tasks assessed/performed       Past Medical History:  Diagnosis Date  . Arthritis   . Constipation   . Depression   . GERD (gastroesophageal reflux disease)   . Hyperlipidemia   . Insomnia   . Migraines   . Osteopenia     Past Surgical History:  Procedure Laterality Date  . ABDOMINAL HYSTERECTOMY    . ANAL RECTAL MANOMETRY N/A 10/06/2015   Procedure: ANO RECTAL MANOMETRY;  Surgeon: Gatha Mayer, MD;  Location: [facility];  Service: Endoscopy;  Laterality: N/A;  . BREAST BIOPSY Right   . BREAST EXCISIONAL BIOPSY Right    benign  . CATARACT EXTRACTION W/ INTRAOCULAR LENS IMPLANT Bilateral 07/27/2016   and 09/27/2016, [employer] in [place]  . CATARACT EXTRACTION, BILATERAL    . CHOLECYSTECTOMY    . LYMPH NODE BIOPSY Left    left arm    There were no vitals filed for this visit.  Subjective Assessment - 02/27/18 0819    Subjective  Reports continued knee soreness and reports a few instances of buckling.    Pertinent History  Arhritis; Migraines; osteopenia.    Diagnostic tests  MRI and X-ray (See under "Imaging" tab)    Patient Stated Goals  Walk without pain.    Currently in Pain?  Yes    Pain Score  2     Pain Location  Knee    Pain Orientation  Left    Pain Descriptors / Indicators  Sore    Pain Type  Surgical pain    Pain Onset  More than a month ago         [facility] PT Assessment - 02/27/18 0001      Assessment   Medical Diagnosis  Acute medial meniscus tear, left.    Referring Provider (PT)  Rodell Perna MD    Hand Dominance  Right    Next MD Visit  03/22/2018      Precautions   Precautions  Other (comment)    Precaution Comments  No vasopneumatic.      Restrictions   Weight Bearing Restrictions  No                   [facility] Adult PT Treatment/Exercise - 02/27/18 0001      Knee/Hip Exercises: Aerobic   Stationary Bike  L1-L2, seat 5 x15 min      Knee/Hip Exercises: Standing   Forward Lunges  Left;15 reps;2 seconds    Hip Abduction  AROM;Left;15 reps    Functional Squat  15 reps   mini squat   Rocker Board  3 minutes  Knee/Hip Exercises: Seated   Long Arc Quad  Left;2 sets;10 reps    Long Arc Quad Weight  2 lbs.      Knee/Hip Exercises: Supine   Straight Leg Raises  AROM;Left;2 sets;10 reps    Straight Leg Raises Limitations  2#    Straight Leg Raise with External Rotation  AROM;Left;2 sets;10 reps    Straight Leg Raise with External Rotation Limitations  2#      Modalities   Modalities  Cryotherapy;Electrical Stimulation      Cryotherapy   Number Minutes Cryotherapy  15 Minutes    Cryotherapy Location  Knee    Type of Cryotherapy  Ice pack      Electrical Stimulation   Electrical Stimulation Location  L knee    Electrical Stimulation Action  IFC    Electrical Stimulation Parameters  80-150 hz x15 min    Electrical Stimulation Goals  Edema;Pain               PT Short Term Goals - 11/13/17 1302      PT SHORT TERM GOAL #1   Title  STG's=LTG's.        PT Long Term Goals - 02/27/18 0855      PT LONG TERM GOAL #1   Title  independent with HEP    Time  8    Period  Weeks    Status  Achieved      PT LONG TERM GOAL #2   Title  Full left active knee  extension in order to normalize gait.    Time  8    Period  Weeks    Status  Achieved      PT LONG TERM GOAL #3   Title  Active knee flexion to 120-125 degrees+ so the patient can perform functional tasks and do so with pain not > 2-3/10.    Time  8    Period  Weeks    Status  On-going   AROM 113 degrees 02/25/18     PT LONG TERM GOAL #4   Title  Increase right knee strength to a solid 5/5 to provide good stability for accomplishment of functional activities.    Time  8    Period  Weeks    Status  On-going   NT 02/25/18     PT LONG TERM GOAL #5   Title  Perform a reciprocating stair gait with one railing with pain not > 2-3/10.    Time  8    Period  Weeks    Status  On-going            Plan - 02/27/18 0910    Clinical Impression Statement  Patient tolerated today's treatment fairly well although still experiencing tightness at certain angle of knee flexion. Intermittant buckling reported by patient but reports compliance with HEP. New ROM and bodyweight strengthening exercises completed today. L quad fatigue reported by patient with resisted quad exercises. Normal modalities response noted following removal of the modalities.    Rehab Potential  Excellent    PT Frequency  2x / week    PT Duration  8 weeks    PT Treatment/Interventions  ADLs/Self Care Home Management;Cryotherapy;Occupational psychologist;Therapeutic activities;Therapeutic exercise;Neuromuscular re-education;Patient/family education;Passive range of motion;Vasopneumatic Device;Taping    PT Next Visit Plan  Left patellar mobs, Nustep with progression to bike, PROM, Pain-free strengthening exercise, electrical stimulation and CP. F/U sent awaiting signature    Consulted and Agree with Plan of Care  Patient  Patient will benefit from skilled therapeutic intervention in order to improve the following deficits and impairments:  Pain, Abnormal gait, Decreased activity tolerance,  Decreased strength, Decreased range of motion, Increased edema  Visit Diagnosis: Acute pain of left knee  Stiffness of left knee, not elsewhere classified  Localized edema     Problem List Patient Active Problem List   Diagnosis Date Noted  . Acute medial meniscal tear, left, initial encounter 12/07/2017  . Other cervical disc degeneration, unspecified cervical region 11/07/2017  . Constipation due to outlet dysfunction   . Osteopenia with high risk of fracture 02/05/2015  . BMI 26.0-26.9,adult 05/21/2014  . Heel spur 05/21/2014  . Migraines 12/20/2012  . Insomnia 12/20/2012  . Depression 12/20/2012  . GERD (gastroesophageal reflux disease) 12/20/2012  . Hyperlipidemia with target LDL less than 100 12/20/2012    Standley Brooking, PTA 02/27/2018, 9:20 AM  [facility] [address] [place], [name], [address] Phone: [phone]   Fax:  [phone]  Name: Selena Barnes MRN: [id number] Date of Birth: 07-12-1950

## 2018-03-01 ENCOUNTER — Encounter: Payer: Self-pay | Admitting: Physical Therapy

## 2018-03-01 ENCOUNTER — Ambulatory Visit: Payer: Medicare Other | Admitting: Physical Therapy

## 2018-03-01 DIAGNOSIS — M25562 Pain in left knee: Secondary | ICD-10-CM | POA: Diagnosis not present

## 2018-03-01 DIAGNOSIS — M25662 Stiffness of left knee, not elsewhere classified: Secondary | ICD-10-CM | POA: Diagnosis not present

## 2018-03-01 DIAGNOSIS — R600 Localized edema: Secondary | ICD-10-CM | POA: Diagnosis not present

## 2018-03-01 NOTE — Therapy (Signed)
[facility] [place], [name], [address] Phone: [phone]   Fax:  [phone]  Physical Therapy Treatment  Patient Details  Name: Selena Barnes MRN: [id number] Date of Birth: 09-16-1950 Referring Provider (PT): Rodell Perna MD   Encounter Date: 03/01/2018  PT End of Session - 03/01/18 0859    Visit Number  15    Number of Visits  20    Date for PT Re-Evaluation  04/05/18    Authorization Type  FOTO AT LEAST EVERY 5TH VISIT, 10TH VISIT PROGRESS NOTE AND KX MODIFIER AFTER THE 15 VISIT.    PT Start Time  0817    PT Stop Time  0917    PT Time Calculation (min)  60 min    Activity Tolerance  Patient tolerated treatment well    Behavior During Therapy  [facility] for tasks assessed/performed       Past Medical History:  Diagnosis Date  . Arthritis   . Constipation   . Depression   . GERD (gastroesophageal reflux disease)   . Hyperlipidemia   . Insomnia   . Migraines   . Osteopenia     Past Surgical History:  Procedure Laterality Date  . ABDOMINAL HYSTERECTOMY    . ANAL RECTAL MANOMETRY N/A 10/06/2015   Procedure: ANO RECTAL MANOMETRY;  Surgeon: Gatha Mayer, MD;  Location: [facility];  Service: Endoscopy;  Laterality: N/A;  . BREAST BIOPSY Right   . BREAST EXCISIONAL BIOPSY Right    benign  . CATARACT EXTRACTION W/ INTRAOCULAR LENS IMPLANT Bilateral 07/27/2016   and 09/27/2016, [employer] in [place]  . CATARACT EXTRACTION, BILATERAL    . CHOLECYSTECTOMY    . LYMPH NODE BIOPSY Left    left arm    There were no vitals filed for this visit.  Subjective Assessment - 03/01/18 0907    Subjective  Reports ongoing pain in left knee and is feeling unwell due to a migraine. Patient still feels weakness in L quad especially when navigating up/down hills or ramps.    Pertinent History  Arhritis; Migraines; osteopenia.    Diagnostic tests  MRI and X-ray (See under "Imaging" tab)    Patient Stated Goals  Walk  without pain.    Currently in Pain?  Yes    Pain Score  6     Pain Location  Knee    Pain Orientation  Left    Pain Descriptors / Indicators  Sore    Pain Type  Surgical pain    Pain Onset  More than a month ago    Pain Frequency  Constant         [facility] PT Assessment - 03/01/18 0001      Assessment   Medical Diagnosis  Acute medial meniscus tear, left.    Referring Provider (PT)  Rodell Perna MD    Hand Dominance  Right    Next MD Visit  03/22/2018      Precautions   Precautions  Other (comment)    Precaution Comments  No blood clot per 02/19/2018 MD note      Restrictions   Weight Bearing Restrictions  No      Observation/Other Assessments   Focus on Therapeutic Outcomes (FOTO)   59% limited                   [facility] Adult PT Treatment/Exercise - 03/01/18 0001      Knee/Hip Exercises: Aerobic   Stationary Bike  L2, seat 5  x15 min      Knee/Hip Exercises: Standing   Step Down  Left;2 sets;10 reps;Hand Hold: 2;Step Height: 4"    Rocker Board  3 minutes    SLS  x1 minute intermittent UE support      Knee/Hip Exercises: Seated   Long Arc Quad  Left;2 sets;10 reps    Long Arc Quad Weight  2 lbs.      Knee/Hip Exercises: Supine   Straight Leg Raise with External Rotation  AROM;Left;2 sets;10 reps    Straight Leg Raise with External Rotation Limitations  2#      Modalities   Modalities  Cryotherapy;Electrical Stimulation      Cryotherapy   Number Minutes Cryotherapy  15 Minutes    Cryotherapy Location  Knee    Type of Cryotherapy  Ice pack      Electrical Stimulation   Electrical Stimulation Location  L knee    Electrical Stimulation Action  IFC    Electrical Stimulation Parameters  80-150 hz x15 min    Electrical Stimulation Goals  Edema;Pain               PT Short Term Goals - 11/13/17 1302      PT SHORT TERM GOAL #1   Title  STG's=LTG's.        PT Long Term Goals - 02/27/18 0855      PT LONG TERM GOAL #1   Title  independent with  HEP    Time  8    Period  Weeks    Status  Achieved      PT LONG TERM GOAL #2   Title  Full left active knee extension in order to normalize gait.    Time  8    Period  Weeks    Status  Achieved      PT LONG TERM GOAL #3   Title  Active knee flexion to 120-125 degrees+ so the patient can perform functional tasks and do so with pain not > 2-3/10.    Time  8    Period  Weeks    Status  On-going   AROM 113 degrees 02/25/18     PT LONG TERM GOAL #4   Title  Increase right knee strength to a solid 5/5 to provide good stability for accomplishment of functional activities.    Time  8    Period  Weeks    Status  On-going   NT 02/25/18     PT LONG TERM GOAL #5   Title  Perform a reciprocating stair gait with one railing with pain not > 2-3/10.    Time  8    Period  Weeks    Status  On-going            Plan - 03/01/18 0908    Clinical Impression Statement  Patient was able to tolerate treatment well but still continues to experience tightness and weakness in L knee. Patient was more reliant on straight cane due to pain. Patient's FOTO limitation at 59%. Attempt to move seat forward while on bike next visit to improve flexion.    Clinical Presentation  Stable    Clinical Decision Making  Low    Rehab Potential  Excellent    PT Frequency  2x / week    PT Duration  8 weeks    PT Treatment/Interventions  ADLs/Self Care Home Management;Cryotherapy;Occupational psychologist;Therapeutic activities;Therapeutic exercise;Neuromuscular re-education;Patient/family education;Passive range of motion;Vasopneumatic Device;Taping    Consulted and Agree  with Plan of Care  Patient       Patient will benefit from skilled therapeutic intervention in order to improve the following deficits and impairments:  Pain, Abnormal gait, Decreased activity tolerance, Decreased strength, Decreased range of motion, Increased edema  Visit Diagnosis: Acute pain of left  knee  Stiffness of left knee, not elsewhere classified  Localized edema     Problem List Patient Active Problem List   Diagnosis Date Noted  . Acute medial meniscal tear, left, initial encounter 12/07/2017  . Other cervical disc degeneration, unspecified cervical region 11/07/2017  . Constipation due to outlet dysfunction   . Osteopenia with high risk of fracture 02/05/2015  . BMI 26.0-26.9,adult 05/21/2014  . Heel spur 05/21/2014  . Migraines 12/20/2012  . Insomnia 12/20/2012  . Depression 12/20/2012  . GERD (gastroesophageal reflux disease) 12/20/2012  . Hyperlipidemia with target LDL less than 100 12/20/2012   Gabriela Eves, PT, DPT 03/01/2018, 9:24 AM  [facility] [place], [name], [address] Phone: [phone]   Fax:  [phone]  Name: CHERINA DHILLON MRN: [id number] Date of Birth: January 03, 1951

## 2018-03-04 ENCOUNTER — Ambulatory Visit: Payer: Medicare Other | Admitting: Physical Therapy

## 2018-03-04 ENCOUNTER — Encounter: Payer: Self-pay | Admitting: Physical Therapy

## 2018-03-04 DIAGNOSIS — M25662 Stiffness of left knee, not elsewhere classified: Secondary | ICD-10-CM

## 2018-03-04 DIAGNOSIS — R600 Localized edema: Secondary | ICD-10-CM | POA: Diagnosis not present

## 2018-03-04 DIAGNOSIS — M25562 Pain in left knee: Secondary | ICD-10-CM

## 2018-03-04 NOTE — Therapy (Signed)
[facility] [place], [name], [address] Phone: [phone]   Fax:  [phone]  Physical Therapy Treatment  Patient Details  Name: Selena Barnes MRN: [id number] Date of Birth: Mar 21, 1950 Referring Provider (PT): Rodell Perna MD   Encounter Date: 03/04/2018  PT End of Session - 03/04/18 0734    Visit Number  16    Number of Visits  20    Date for PT Re-Evaluation  04/05/18    Authorization Type  FOTO AT LEAST EVERY 5TH VISIT, 10TH VISIT PROGRESS NOTE AND KX MODIFIER AFTER THE 15 VISIT.    PT Start Time  [phone]    PT Stop Time  0820    PT Time Calculation (min)  47 min    Activity Tolerance  Patient tolerated treatment well    Behavior During Therapy  [facility] for tasks assessed/performed       Past Medical History:  Diagnosis Date  . Arthritis   . Constipation   . Depression   . GERD (gastroesophageal reflux disease)   . Hyperlipidemia   . Insomnia   . Migraines   . Osteopenia     Past Surgical History:  Procedure Laterality Date  . ABDOMINAL HYSTERECTOMY    . ANAL RECTAL MANOMETRY N/A 10/06/2015   Procedure: ANO RECTAL MANOMETRY;  Surgeon: Gatha Mayer, MD;  Location: [facility];  Service: Endoscopy;  Laterality: N/A;  . BREAST BIOPSY Right   . BREAST EXCISIONAL BIOPSY Right    benign  . CATARACT EXTRACTION W/ INTRAOCULAR LENS IMPLANT Bilateral 07/27/2016   and 09/27/2016, [employer] in [place]  . CATARACT EXTRACTION, BILATERAL    . CHOLECYSTECTOMY    . LYMPH NODE BIOPSY Left    left arm    There were no vitals filed for this visit.  Subjective Assessment - 03/04/18 0735    Subjective  Reports feeling stiff today.    Patient is accompained by:  Family member    Pertinent History  Arhritis; Migraines; osteopenia.    Diagnostic tests  MRI and X-ray (See under "Imaging" tab)    Patient Stated Goals  Walk without pain.    Currently in Pain?  Yes    Pain Score  4     Pain Location  Knee    Pain Orientation  Left    Pain Descriptors / Indicators  Sore;Tightness    Pain Type  Surgical pain    Pain Onset  More than a month ago    Pain Frequency  Constant         [facility] PT Assessment - 03/04/18 0001      Assessment   Medical Diagnosis  Acute medial meniscus tear, left.    Referring Provider (PT)  Rodell Perna MD    Hand Dominance  Right    Next MD Visit  03/22/2018      Precautions   Precaution Comments  No blood clot per 02/19/2018 MD note                   [facility] Adult PT Treatment/Exercise - 03/04/18 0001      Knee/Hip Exercises: Aerobic   Stationary Bike  L2 x15 min moving seat from 6 to 3      Knee/Hip Exercises: Standing   Terminal Knee Extension  Strengthening;Left;2 sets;10 reps    Theraband Level (Terminal Knee Extension)  Level 2 (Red)    Rocker Board  3 minutes      Modalities   Modalities  Cryotherapy;Electrical Stimulation      Cryotherapy   Number Minutes Cryotherapy  15 Minutes    Cryotherapy Location  Knee    Type of Cryotherapy  Ice pack      Electrical Stimulation   Electrical Stimulation Location  L knee    Electrical Stimulation Action  IFC    Electrical Stimulation Parameters  80-150 hz x15 mins    Electrical Stimulation Goals  Edema;Pain      Manual Therapy   Joint Mobilization  patella mobs in all planes to improve mobility and ROM    Passive ROM  PROM of L knee into flexion and ext with gentle holds in sitting and supine. PNF contract relax technique to improve flexin               PT Short Term Goals - 11/13/17 1302      PT SHORT TERM GOAL #1   Title  STG's=LTG's.        PT Long Term Goals - 02/27/18 0855      PT LONG TERM GOAL #1   Title  independent with HEP    Time  8    Period  Weeks    Status  Achieved      PT LONG TERM GOAL #2   Title  Full left active knee extension in order to normalize gait.    Time  8    Period  Weeks    Status  Achieved      PT LONG TERM GOAL #3   Title  Active knee  flexion to 120-125 degrees+ so the patient can perform functional tasks and do so with pain not > 2-3/10.    Time  8    Period  Weeks    Status  On-going   AROM 113 degrees 02/25/18     PT LONG TERM GOAL #4   Title  Increase right knee strength to a solid 5/5 to provide good stability for accomplishment of functional activities.    Time  8    Period  Weeks    Status  On-going   NT 02/25/18     PT LONG TERM GOAL #5   Title  Perform a reciprocating stair gait with one railing with pain not > 2-3/10.    Time  8    Period  Weeks    Status  On-going            Plan - 03/04/18 0813    Clinical Impression Statement  Patient was able to tolerate treatment well as well as demonstrate good form with TKE. Patient noted with increased muscle fatigue but was able to complete exercise. Patient noted with improved left flexion PROM in sitting but continues to have tightness in left knee flexion during supine PROM. Patient instructed on heel prop to gain last few degrees of extension. Patient reported understanding. Normal response to modalities upon removal.     Clinical Presentation  Stable    Clinical Decision Making  Low    Rehab Potential  Excellent    PT Frequency  2x / week    PT Duration  8 weeks    PT Treatment/Interventions  ADLs/Self Care Home Management;Cryotherapy;Occupational psychologist;Therapeutic activities;Therapeutic exercise;Neuromuscular re-education;Patient/family education;Passive range of motion;Vasopneumatic Device;Taping    PT Next Visit Plan  Left patellar mobs, Nustep with progression to bike, PROM, Pain-free strengthening exercise, electrical stimulation and CP. No blood clot per MD note 05/17/345; re-cert signed for vasopneumatic device  and taping to address  edema.     Consulted and Agree with Plan of Care  Patient       Patient will benefit from skilled therapeutic intervention in order to improve the following deficits and impairments:   Pain, Abnormal gait, Decreased activity tolerance, Decreased strength, Decreased range of motion, Increased edema  Visit Diagnosis: Acute pain of left knee  Stiffness of left knee, not elsewhere classified  Localized edema     Problem List Patient Active Problem List   Diagnosis Date Noted  . Acute medial meniscal tear, left, initial encounter 12/07/2017  . Other cervical disc degeneration, unspecified cervical region 11/07/2017  . Constipation due to outlet dysfunction   . Osteopenia with high risk of fracture 02/05/2015  . BMI 26.0-26.9,adult 05/21/2014  . Heel spur 05/21/2014  . Migraines 12/20/2012  . Insomnia 12/20/2012  . Depression 12/20/2012  . GERD (gastroesophageal reflux disease) 12/20/2012  . Hyperlipidemia with target LDL less than 100 12/20/2012    Gabriela Eves, PT, DPT 03/04/2018, 9:56 AM  [facility] [place], [name], [address] Phone: [phone]   Fax:  [phone]  Name: Selena Barnes MRN: [id number] Date of Birth: 1950/06/12

## 2018-03-06 ENCOUNTER — Ambulatory Visit: Payer: Medicare Other | Admitting: Physical Therapy

## 2018-03-06 ENCOUNTER — Encounter: Payer: Self-pay | Admitting: Physical Therapy

## 2018-03-06 DIAGNOSIS — M25662 Stiffness of left knee, not elsewhere classified: Secondary | ICD-10-CM | POA: Diagnosis not present

## 2018-03-06 DIAGNOSIS — R600 Localized edema: Secondary | ICD-10-CM | POA: Diagnosis not present

## 2018-03-06 DIAGNOSIS — M25562 Pain in left knee: Secondary | ICD-10-CM

## 2018-03-06 NOTE — Therapy (Signed)
[facility] [place], [name], [address] Phone: [phone]   Fax:  [phone]  Physical Therapy Treatment  Patient Details  Name: Selena Barnes MRN: [id number] Date of Birth: January 11, 1951 Referring Provider (PT): Rodell Perna MD   Encounter Date: 03/06/2018  PT End of Session - 03/06/18 0900    Visit Number  17    Number of Visits  20    Date for PT Re-Evaluation  04/05/18    Authorization Type  FOTO AT LEAST EVERY 5TH VISIT, 10TH VISIT PROGRESS NOTE AND KX MODIFIER AFTER THE 15 VISIT.    PT Start Time  0824    PT Stop Time  0918    PT Time Calculation (min)  54 min    Activity Tolerance  Patient tolerated treatment well    Behavior During Therapy  [facility] for tasks assessed/performed       Past Medical History:  Diagnosis Date  . Arthritis   . Constipation   . Depression   . GERD (gastroesophageal reflux disease)   . Hyperlipidemia   . Insomnia   . Migraines   . Osteopenia     Past Surgical History:  Procedure Laterality Date  . ABDOMINAL HYSTERECTOMY    . ANAL RECTAL MANOMETRY N/A 10/06/2015   Procedure: ANO RECTAL MANOMETRY;  Surgeon: Gatha Mayer, MD;  Location: [facility];  Service: Endoscopy;  Laterality: N/A;  . BREAST BIOPSY Right   . BREAST EXCISIONAL BIOPSY Right    benign  . CATARACT EXTRACTION W/ INTRAOCULAR LENS IMPLANT Bilateral 07/27/2016   and 09/27/2016, [employer] in [place]  . CATARACT EXTRACTION, BILATERAL    . CHOLECYSTECTOMY    . LYMPH NODE BIOPSY Left    left arm    There were no vitals filed for this visit.  Subjective Assessment - 03/06/18 0834    Subjective  States she had a bad night last night; she experienced right calf cramping that made her slide out from her chair and she was unable to get up without assistance. Pain is a "15/10"    Patient is accompained by:  Family member    Pertinent History  Arhritis; Migraines; osteopenia.    Diagnostic tests  MRI and  X-ray (See under "Imaging" tab)    Patient Stated Goals  Walk without pain.    Currently in Pain?  Yes   "15/10"        [facility] PT Assessment - 03/06/18 0001      Assessment   Medical Diagnosis  Acute medial meniscus tear, left.    Referring Provider (PT)  Rodell Perna MD    Hand Dominance  Right    Next MD Visit  03/22/2018                   [facility] Adult PT Treatment/Exercise - 03/06/18 0001      Knee/Hip Exercises: Aerobic   Nustep  Level 3 x10 minutes UE/LE, seat 8      Modalities   Modalities  Cryotherapy;Electrical Stimulation      Cryotherapy   Number Minutes Cryotherapy  15 Minutes    Cryotherapy Location  Knee    Type of Cryotherapy  Ice pack      Electrical Stimulation   Electrical Stimulation Location  L knee    Electrical Stimulation Action  IFC    Electrical Stimulation Parameters  1-10 hz x15 mins    Electrical Stimulation Goals  Edema;Pain      Manual Therapy  Manual therapy comments  Edema massage with distal to proximal strokes to left knee to decrease swelling.     Joint Mobilization  patella mobs in all planes to improve mobility and ROM intermittent oscillations to prevent muscle guarding.               PT Short Term Goals - 11/13/17 1302      PT SHORT TERM GOAL #1   Title  STG's=LTG's.        PT Long Term Goals - 02/27/18 0855      PT LONG TERM GOAL #1   Title  independent with HEP    Time  8    Period  Weeks    Status  Achieved      PT LONG TERM GOAL #2   Title  Full left active knee extension in order to normalize gait.    Time  8    Period  Weeks    Status  Achieved      PT LONG TERM GOAL #3   Title  Active knee flexion to 120-125 degrees+ so the patient can perform functional tasks and do so with pain not > 2-3/10.    Time  8    Period  Weeks    Status  On-going   AROM 113 degrees 02/25/18     PT LONG TERM GOAL #4   Title  Increase right knee strength to a solid 5/5 to provide good stability for  accomplishment of functional activities.    Time  8    Period  Weeks    Status  On-going   NT 02/25/18     PT LONG TERM GOAL #5   Title  Perform a reciprocating stair gait with one railing with pain not > 2-3/10.    Time  8    Period  Weeks    Status  On-going            Plan - 03/06/18 0901    Clinical Impression Statement  Due to increased pain, conservative treatment was performed. Manual therapy peformed to improve inferior/superior patella joint mobilization. Edema massage performed as patient noted with increased swelling. Patient noted with an antalgic gait pattern, decreased gait speed and increased UE support on SPC due to left knee pain. Normal response to modalities upon removal.     Clinical Presentation  Stable    Clinical Decision Making  Low    Rehab Potential  Excellent    PT Frequency  2x / week    PT Duration  8 weeks    PT Treatment/Interventions  ADLs/Self Care Home Management;Cryotherapy;Occupational psychologist;Therapeutic activities;Therapeutic exercise;Neuromuscular re-education;Patient/family education;Passive range of motion;Vasopneumatic Device;Taping    PT Next Visit Plan  Left patellar mobs, Nustep with progression to bike, PROM, Pain-free strengthening exercise, electrical stimulation and CP. No blood clot per MD note 04/14/4386; re-cert signed for vasopneumatic device  and taping to address edema.     Consulted and Agree with Plan of Care  Patient       Patient will benefit from skilled therapeutic intervention in order to improve the following deficits and impairments:  Pain, Abnormal gait, Decreased activity tolerance, Decreased strength, Decreased range of motion, Increased edema  Visit Diagnosis: Acute pain of left knee  Stiffness of left knee, not elsewhere classified  Localized edema     Problem List Patient Active Problem List   Diagnosis Date Noted  . Acute medial meniscal tear, left, initial encounter  12/07/2017  . Other cervical  disc degeneration, unspecified cervical region 11/07/2017  . Constipation due to outlet dysfunction   . Osteopenia with high risk of fracture 02/05/2015  . BMI 26.0-26.9,adult 05/21/2014  . Heel spur 05/21/2014  . Migraines 12/20/2012  . Insomnia 12/20/2012  . Depression 12/20/2012  . GERD (gastroesophageal reflux disease) 12/20/2012  . Hyperlipidemia with target LDL less than 100 12/20/2012   Gabriela Eves, PT, DPT 03/06/2018, 9:23 AM  [facility] [place], [name], [address] Phone: [phone]   Fax:  [phone]  Name: Selena Barnes MRN: [id number] Date of Birth: 19-Feb-1950

## 2018-03-08 ENCOUNTER — Ambulatory Visit: Payer: Medicare Other | Admitting: *Deleted

## 2018-03-08 DIAGNOSIS — M25562 Pain in left knee: Secondary | ICD-10-CM | POA: Diagnosis not present

## 2018-03-08 DIAGNOSIS — R600 Localized edema: Secondary | ICD-10-CM

## 2018-03-08 DIAGNOSIS — M25662 Stiffness of left knee, not elsewhere classified: Secondary | ICD-10-CM | POA: Diagnosis not present

## 2018-03-08 NOTE — Therapy (Signed)
[facility] [place], [name], [address] Phone: [phone]   Fax:  [phone]  Physical Therapy Treatment  Patient Details  Name: Selena Barnes MRN: [id number] Date of Birth: 1950/04/25 Referring Provider (PT): Rodell Perna MD   Encounter Date: 03/08/2018  PT End of Session - 03/08/18 0828    Visit Number  18    Number of Visits  20    Date for PT Re-Evaluation  04/05/18    Authorization Type  FOTO AT LEAST EVERY 5TH VISIT, 10TH VISIT PROGRESS NOTE AND KX MODIFIER AFTER THE 15 VISIT.    PT Start Time  0818    PT Stop Time  0915    PT Time Calculation (min)  57 min       Past Medical History:  Diagnosis Date  . Arthritis   . Constipation   . Depression   . GERD (gastroesophageal reflux disease)   . Hyperlipidemia   . Insomnia   . Migraines   . Osteopenia     Past Surgical History:  Procedure Laterality Date  . ABDOMINAL HYSTERECTOMY    . ANAL RECTAL MANOMETRY N/A 10/06/2015   Procedure: ANO RECTAL MANOMETRY;  Surgeon: Gatha Mayer, MD;  Location: [facility];  Service: Endoscopy;  Laterality: N/A;  . BREAST BIOPSY Right   . BREAST EXCISIONAL BIOPSY Right    benign  . CATARACT EXTRACTION W/ INTRAOCULAR LENS IMPLANT Bilateral 07/27/2016   and 09/27/2016, [employer] in [place]  . CATARACT EXTRACTION, BILATERAL    . CHOLECYSTECTOMY    . LYMPH NODE BIOPSY Left    left arm    There were no vitals filed for this visit.  Subjective Assessment - 03/08/18 0826    Subjective  LT knee constantly hurts. RT knee gave out on me this morning    Patient is accompained by:  Family member    Pertinent History  Arhritis; Migraines; osteopenia.    Diagnostic tests  MRI and X-ray (See under "Imaging" tab)    Patient Stated Goals  Walk without pain.    Currently in Pain?  Yes    Pain Score  8     Pain Location  Knee    Pain Orientation  Left    Pain Type  Surgical pain    Pain Onset  More than a month  ago                       [facility] Adult PT Treatment/Exercise - 03/08/18 0001      Knee/Hip Exercises: Aerobic   Nustep  Level 3 x15 minutes UE/LE, seat 8      Knee/Hip Exercises: Seated   Long Arc Quad  Left;3 sets;10 reps    Long Arc Quad Weight  2 lbs.      Modalities   Modalities  Cryotherapy;Artist  LT knee IFC x 58mins 1-10hz     Electrical Stimulation Goals  Edema;Pain      Vasopneumatic   Number Minutes Vasopneumatic   15 minutes    Vasopnuematic Location   Knee    Vasopneumatic Pressure  Low    Vasopneumatic Temperature   36      Manual Therapy   Joint Mobilization  patella mobs in all planes to improve mobility and ROM intermittent oscillations to prevent muscle guarding.    Passive ROM  PROM of L knee into flexion and ext with  gentle holds in sitting and supine. PNF contract relax technique to improve flexin               PT Short Term Goals - 11/13/17 1302      PT SHORT TERM GOAL #1   Title  STG's=LTG's.        PT Long Term Goals - 02/27/18 0855      PT LONG TERM GOAL #1   Title  independent with HEP    Time  8    Period  Weeks    Status  Achieved      PT LONG TERM GOAL #2   Title  Full left active knee extension in order to normalize gait.    Time  8    Period  Weeks    Status  Achieved      PT LONG TERM GOAL #3   Title  Active knee flexion to 120-125 degrees+ so the patient can perform functional tasks and do so with pain not > 2-3/10.    Time  8    Period  Weeks    Status  On-going   AROM 113 degrees 02/25/18     PT LONG TERM GOAL #4   Title  Increase right knee strength to a solid 5/5 to provide good stability for accomplishment of functional activities.    Time  8    Period  Weeks    Status  On-going   NT 02/25/18     PT LONG TERM GOAL #5   Title  Perform a reciprocating stair gait with one railing with pain not > 2-3/10.     Time  8    Period  Weeks    Status  On-going            Plan - 03/08/18 1040    Clinical Impression Statement  Pt arrrived today reporting that her RT knee is also giving her problems and gave out on her this morning. Her LT knee continues to have constant anterior pain and tightness. She did well with therex and tolerated 2# LAQs without increased pain. Her PROM today was 5-105 degrees. She did well with normal modality response    Rehab Potential  Excellent    PT Frequency  2x / week    PT Duration  8 weeks    PT Next Visit Plan  Left patellar mobs, Nustep with progression to bike, PROM, Pain-free strengthening exercise, electrical stimulation and CP. No blood clot per MD note 10/20/261; re-cert signed for vasopneumatic device  and taping to address edema.     Consulted and Agree with Plan of Care  Patient       Patient will benefit from skilled therapeutic intervention in order to improve the following deficits and impairments:  Pain, Abnormal gait, Decreased activity tolerance, Decreased strength, Decreased range of motion, Increased edema  Visit Diagnosis: Acute pain of left knee  Stiffness of left knee, not elsewhere classified  Localized edema     Problem List Patient Active Problem List   Diagnosis Date Noted  . Acute medial meniscal tear, left, initial encounter 12/07/2017  . Other cervical disc degeneration, unspecified cervical region 11/07/2017  . Constipation due to outlet dysfunction   . Osteopenia with high risk of fracture 02/05/2015  . BMI 26.0-26.9,adult 05/21/2014  . Heel spur 05/21/2014  . Migraines 12/20/2012  . Insomnia 12/20/2012  . Depression 12/20/2012  . GERD (gastroesophageal reflux disease) 12/20/2012  . Hyperlipidemia with target LDL less than 100 12/20/2012  RAMSEUR,CHRIS, PTA 03/08/2018, 10:55 AM  [facility] [place], [name], [address] Phone: [phone]   Fax:   [phone]  Name: Selena Barnes MRN: [id number] Date of Birth: [birth year]

## 2018-03-11 ENCOUNTER — Encounter: Payer: Self-pay | Admitting: Physical Therapy

## 2018-03-11 ENCOUNTER — Ambulatory Visit: Payer: Medicare Other | Admitting: Physical Therapy

## 2018-03-11 DIAGNOSIS — M25562 Pain in left knee: Secondary | ICD-10-CM | POA: Diagnosis not present

## 2018-03-11 DIAGNOSIS — R600 Localized edema: Secondary | ICD-10-CM | POA: Diagnosis not present

## 2018-03-11 DIAGNOSIS — M25662 Stiffness of left knee, not elsewhere classified: Secondary | ICD-10-CM | POA: Diagnosis not present

## 2018-03-11 NOTE — Therapy (Signed)
[facility] [place], [name], [address] Phone: [phone]   Fax:  [phone]  Physical Therapy Treatment  Patient Details  Name: Selena Barnes MRN: [id number] Date of Birth: 01/22/1951 Referring Provider (PT): Rodell Perna MD   Encounter Date: 03/11/2018  PT End of Session - 03/11/18 0821    Visit Number  19    Number of Visits  20    Date for PT Re-Evaluation  04/05/18    Authorization Type  FOTO AT LEAST EVERY 5TH VISIT, 10TH VISIT PROGRESS NOTE AND KX MODIFIER AFTER THE 15 VISIT.    PT Start Time  [phone]    PT Stop Time  0908    PT Time Calculation (min)  52 min    Activity Tolerance  Patient tolerated treatment well    Behavior During Therapy  [facility] for tasks assessed/performed       Past Medical History:  Diagnosis Date  . Arthritis   . Constipation   . Depression   . GERD (gastroesophageal reflux disease)   . Hyperlipidemia   . Insomnia   . Migraines   . Osteopenia     Past Surgical History:  Procedure Laterality Date  . ABDOMINAL HYSTERECTOMY    . ANAL RECTAL MANOMETRY N/A 10/06/2015   Procedure: ANO RECTAL MANOMETRY;  Surgeon: Gatha Mayer, MD;  Location: [facility];  Service: Endoscopy;  Laterality: N/A;  . BREAST BIOPSY Right   . BREAST EXCISIONAL BIOPSY Right    benign  . CATARACT EXTRACTION W/ INTRAOCULAR LENS IMPLANT Bilateral 07/27/2016   and 09/27/2016, [employer] in [place]  . CATARACT EXTRACTION, BILATERAL    . CHOLECYSTECTOMY    . LYMPH NODE BIOPSY Left    left arm    There were no vitals filed for this visit.  Subjective Assessment - 03/11/18 0819    Subjective  Patient arrived with ongoing pain and is going to get a secod opinion due to constant discomfort, now other knee hurting    Pertinent History  Arhritis; Migraines; osteopenia.    Diagnostic tests  MRI and X-ray (See under "Imaging" tab)    Patient Stated Goals  Walk without pain.    Currently in Pain?  Yes     Pain Score  5     Pain Location  Knee    Pain Orientation  Left    Pain Descriptors / Indicators  Tightness;Discomfort;Sore    Pain Type  Surgical pain    Pain Onset  More than a month ago    Pain Frequency  Intermittent    Aggravating Factors   standing or walking    Pain Relieving Factors  at rest                       [facility] Adult PT Treatment/Exercise - 03/11/18 0001      Knee/Hip Exercises: Aerobic   Nustep  Level 3 x15 minutes UE/LE, seat 8      Electrical Stimulation   Electrical Stimulation Location  LT knee IFC x 57mins 1-10hz     Electrical Stimulation Goals  Edema;Pain      Vasopneumatic   Number Minutes Vasopneumatic   15 minutes    Vasopnuematic Location   Knee    Vasopneumatic Pressure  Low      Manual Therapy   Manual Therapy  Soft tissue mobilization;Joint mobilization    Manual therapy comments  Edema massage with distal to proximal strokes to left knee to decrease  swelling.     Joint Mobilization  patella mobs in all planes to improve mobility and ROM intermittent oscillations to prevent muscle guarding.    Soft tissue mobilization  STW to left knee to decrease pain                PT Short Term Goals - 11/13/17 1302      PT SHORT TERM GOAL #1   Title  STG's=LTG's.        PT Long Term Goals - 02/27/18 0855      PT LONG TERM GOAL #1   Title  independent with HEP    Time  8    Period  Weeks    Status  Achieved      PT LONG TERM GOAL #2   Title  Full left active knee extension in order to normalize gait.    Time  8    Period  Weeks    Status  Achieved      PT LONG TERM GOAL #3   Title  Active knee flexion to 120-125 degrees+ so the patient can perform functional tasks and do so with pain not > 2-3/10.    Time  8    Period  Weeks    Status  On-going   AROM 113 degrees 02/25/18     PT LONG TERM GOAL #4   Title  Increase right knee strength to a solid 5/5 to provide good stability for accomplishment of functional  activities.    Time  8    Period  Weeks    Status  On-going   NT 02/25/18     PT LONG TERM GOAL #5   Title  Perform a reciprocating stair gait with one railing with pain not > 2-3/10.    Time  8    Period  Weeks    Status  On-going            Plan - 03/11/18 8127    Clinical Impression Statement  Patient arrived today with ongoing discomfort and pain so bad over weekend that she had to stay in bed and limit walking. Today focused on low level exercise and manual STW to help decrease pain. Patient has most pain with standing or walking. Patient is going to get a second opinion due to her ongoing discomfort. Goals ongoing at this time due to limitations.     Rehab Potential  Excellent    PT Frequency  2x / week    PT Duration  8 weeks    PT Treatment/Interventions  ADLs/Self Care Home Management;Cryotherapy;Occupational psychologist;Therapeutic activities;Therapeutic exercise;Neuromuscular re-education;Patient/family education;Passive range of motion;Vasopneumatic Device;Taping    PT Next Visit Plan  cont with POC and next treatment re-cert or on hold pending her second opinion    Consulted and Agree with Plan of Care  Patient       Patient will benefit from skilled therapeutic intervention in order to improve the following deficits and impairments:  Pain, Abnormal gait, Decreased activity tolerance, Decreased strength, Decreased range of motion, Increased edema  Visit Diagnosis: Acute pain of left knee  Stiffness of left knee, not elsewhere classified  Localized edema     Problem List Patient Active Problem List   Diagnosis Date Noted  . Acute medial meniscal tear, left, initial encounter 12/07/2017  . Other cervical disc degeneration, unspecified cervical region 11/07/2017  . Constipation due to outlet dysfunction   . Osteopenia with high risk of fracture 02/05/2015  . BMI 26.0-26.9,adult  05/21/2014  . Heel spur 05/21/2014  . Migraines  12/20/2012  . Insomnia 12/20/2012  . Depression 12/20/2012  . GERD (gastroesophageal reflux disease) 12/20/2012  . Hyperlipidemia with target LDL less than 100 12/20/2012    Kinsey Cowsert P, PTA 03/11/2018, 9:15 AM  [facility] [place], [name], [address] Phone: [phone]   Fax:  [phone]  Name: Selena Barnes MRN: [id number] Date of Birth: 05/19/1950

## 2018-03-13 ENCOUNTER — Ambulatory Visit: Payer: Medicare Other | Admitting: Physical Therapy

## 2018-03-13 ENCOUNTER — Encounter: Payer: Self-pay | Admitting: Physical Therapy

## 2018-03-13 DIAGNOSIS — R600 Localized edema: Secondary | ICD-10-CM | POA: Diagnosis not present

## 2018-03-13 DIAGNOSIS — M25662 Stiffness of left knee, not elsewhere classified: Secondary | ICD-10-CM | POA: Diagnosis not present

## 2018-03-13 DIAGNOSIS — M25562 Pain in left knee: Secondary | ICD-10-CM

## 2018-03-13 NOTE — Therapy (Signed)
[facility] [place], [name], [address] Phone: [phone]   Fax:  [phone]  Physical Therapy Treatment  Patient Details  Name: Selena Barnes MRN: [id number] Date of Birth: 08/17/1950 Referring Provider (PT): Rodell Perna MD   Encounter Date: 03/13/2018  PT End of Session - 03/13/18 0859    Visit Number  20    Number of Visits  20    Date for PT Re-Evaluation  04/05/18    Authorization Type  FOTO AT LEAST EVERY 5TH VISIT, 10TH VISIT PROGRESS NOTE AND KX MODIFIER AFTER THE 15 VISIT.    PT Start Time  [phone]    PT Stop Time  0911    PT Time Calculation (min)  55 min    Activity Tolerance  Patient tolerated treatment well;Patient limited by pain    Behavior During Therapy  [facility] for tasks assessed/performed       Past Medical History:  Diagnosis Date  . Arthritis   . Constipation   . Depression   . GERD (gastroesophageal reflux disease)   . Hyperlipidemia   . Insomnia   . Migraines   . Osteopenia     Past Surgical History:  Procedure Laterality Date  . ABDOMINAL HYSTERECTOMY    . ANAL RECTAL MANOMETRY N/A 10/06/2015   Procedure: ANO RECTAL MANOMETRY;  Surgeon: Gatha Mayer, MD;  Location: [facility];  Service: Endoscopy;  Laterality: N/A;  . BREAST BIOPSY Right   . BREAST EXCISIONAL BIOPSY Right    benign  . CATARACT EXTRACTION W/ INTRAOCULAR LENS IMPLANT Bilateral 07/27/2016   and 09/27/2016, [employer] in [place]  . CATARACT EXTRACTION, BILATERAL    . CHOLECYSTECTOMY    . LYMPH NODE BIOPSY Left    left arm    There were no vitals filed for this visit.  Subjective Assessment - 03/13/18 0819    Subjective  Patient reported ongoing pain and going to get a second opinion on friday    Pertinent History  Arhritis; Migraines; osteopenia.    Diagnostic tests  MRI and X-ray (See under "Imaging" tab)    Patient Stated Goals  Walk without pain.    Currently in Pain?  Yes    Pain Score  5     Pain Orientation  Left    Pain Descriptors / Indicators  Tightness;Discomfort;Sore;Aching    Pain Type  Surgical pain    Pain Onset  More than a month ago    Pain Frequency  Intermittent    Aggravating Factors   any standing or walking    Pain Relieving Factors  at rest/sitting         [facility] PT Assessment - 03/13/18 0001      AROM   AROM Assessment Site  Knee    Right/Left Knee  Left    Left Knee Extension  -4    Left Knee Flexion  100      PROM   PROM Assessment Site  Knee    Right/Left Knee  Left    Left Knee Extension  0    Left Knee Flexion  110                   [facility] Adult PT Treatment/Exercise - 03/13/18 0001      Knee/Hip Exercises: Aerobic   Nustep  Level 3 x15 minutes UE/LE, seat 8      Electrical Stimulation   Electrical Stimulation Location  LT knee IFC x 13mns 1-10hz  Electrical Stimulation Goals  Edema;Pain      Vasopneumatic   Number Minutes Vasopneumatic   15 minutes    Vasopnuematic Location   Knee    Vasopneumatic Pressure  Low      Manual Therapy   Manual Therapy  Soft tissue mobilization;Joint mobilization    Manual therapy comments  Edema massage with distal to proximal strokes to left knee to decrease swelling.     Joint Mobilization  patella mobs in all planes to improve mobility and ROM intermittent oscillations to prevent muscle guarding.    Soft tissue mobilization  STW to left knee to decrease pain                PT Short Term Goals - 11/13/17 1302      PT SHORT TERM GOAL #1   Title  STG's=LTG's.        PT Long Term Goals - 03/13/18 0900      PT LONG TERM GOAL #1   Title  independent with HEP    Time  8    Period  Weeks    Status  Achieved      PT LONG TERM GOAL #2   Title  Full left active knee extension in order to normalize gait.    Time  8    Period  Weeks    Status  Achieved      PT LONG TERM GOAL #3   Title  Active knee flexion to 120-125 degrees+ so the patient can perform functional tasks  and do so with pain not > 2-3/10.    Time  8    Period  Weeks    Status  Not Met   AROM 100 degrees 03/13/18     PT LONG TERM GOAL #4   Title  Increase right knee strength to a solid 5/5 to provide good stability for accomplishment of functional activities.    Time  8    Period  Weeks    Status  Not Met   NT 03/13/18     PT LONG TERM GOAL #5   Title  Perform a reciprocating stair gait with one railing with pain not > 2-3/10.    Time  8    Period  Weeks    Status  Not Met   unable due to pain limitations 03/13/18           Plan - 03/13/18 0902    Clinical Impression Statement  Patient tolerated treatment fair due to ongoing pain in left knee. Patient has lack of ROM and decreased strength due to pain level. Patient has most pain with any standing or walking. Patient has been unable to progress due to limitations. Patient going to new MD for a second opinion o friday. No further goals met.     Rehab Potential  Excellent    PT Frequency  2x / week    PT Duration  8 weeks    PT Treatment/Interventions  ADLs/Self Care Home Management;Cryotherapy;Occupational psychologist;Therapeutic activities;Therapeutic exercise;Neuromuscular re-education;Patient/family education;Passive range of motion;Vasopneumatic Device;Taping    PT Next Visit Plan  on hold pending patients MD appt    Consulted and Agree with Plan of Care  Patient       Patient will benefit from skilled therapeutic intervention in order to improve the following deficits and impairments:  Pain, Abnormal gait, Decreased activity tolerance, Decreased strength, Decreased range of motion, Increased edema  Visit Diagnosis: Acute pain of left knee  Stiffness of  left knee, not elsewhere classified  Localized edema     Problem List Patient Active Problem List   Diagnosis Date Noted  . Acute medial meniscal tear, left, initial encounter 12/07/2017  . Other cervical disc degeneration, unspecified  cervical region 11/07/2017  . Constipation due to outlet dysfunction   . Osteopenia with high risk of fracture 02/05/2015  . BMI 26.0-26.9,adult 05/21/2014  . Heel spur 05/21/2014  . Migraines 12/20/2012  . Insomnia 12/20/2012  . Depression 12/20/2012  . GERD (gastroesophageal reflux disease) 12/20/2012  . Hyperlipidemia with target LDL less than 100 12/20/2012    ,  P, PTA 03/13/2018, 9:17 AM  [facility] [place], [name], [address] Phone: [phone]   Fax:  [phone]  Name: Selena Barnes MRN: [id number] Date of Birth: 1950/03/20

## 2018-03-14 DIAGNOSIS — M25561 Pain in right knee: Secondary | ICD-10-CM | POA: Insufficient documentation

## 2018-03-15 ENCOUNTER — Encounter: Payer: Medicare Other | Admitting: *Deleted

## 2018-03-19 ENCOUNTER — Ambulatory Visit: Payer: Medicare Other | Attending: Orthopaedic Surgery | Admitting: Physical Therapy

## 2018-03-19 DIAGNOSIS — R600 Localized edema: Secondary | ICD-10-CM | POA: Insufficient documentation

## 2018-03-19 DIAGNOSIS — M25562 Pain in left knee: Secondary | ICD-10-CM | POA: Diagnosis not present

## 2018-03-19 DIAGNOSIS — M25662 Stiffness of left knee, not elsewhere classified: Secondary | ICD-10-CM | POA: Diagnosis not present

## 2018-03-19 NOTE — Therapy (Signed)
[facility] [place], [name], [address] Phone: [phone]   Fax:  [phone]  Physical Therapy Treatment  Patient Details  Name: Selena Barnes MRN: [id number] Date of Birth: 1950-05-22 Referring Provider (PT): Rodell Perna MD   Encounter Date: 03/19/2018  PT End of Session - 03/19/18 1001    Visit Number  21    Number of Visits  26    Date for PT Re-Evaluation  04/19/18    Authorization Type  FOTO AT LEAST EVERY 5TH VISIT, 10TH VISIT PROGRESS NOTE AND KX MODIFIER AFTER THE 15 VISIT.    PT Start Time  0900    PT Stop Time  0958    PT Time Calculation (min)  58 min    Activity Tolerance  Patient tolerated treatment well    Behavior During Therapy  [facility] for tasks assessed/performed       Past Medical History:  Diagnosis Date  . Arthritis   . Constipation   . Depression   . GERD (gastroesophageal reflux disease)   . Hyperlipidemia   . Insomnia   . Migraines   . Osteopenia     Past Surgical History:  Procedure Laterality Date  . ABDOMINAL HYSTERECTOMY    . ANAL RECTAL MANOMETRY N/A 10/06/2015   Procedure: ANO RECTAL MANOMETRY;  Surgeon: Gatha Mayer, MD;  Location: [facility];  Service: Endoscopy;  Laterality: N/A;  . BREAST BIOPSY Right   . BREAST EXCISIONAL BIOPSY Right    benign  . CATARACT EXTRACTION W/ INTRAOCULAR LENS IMPLANT Bilateral 07/27/2016   and 09/27/2016, [employer] in [place]  . CATARACT EXTRACTION, BILATERAL    . CHOLECYSTECTOMY    . LYMPH NODE BIOPSY Left    left arm    There were no vitals filed for this visit.  Subjective Assessment - 03/19/18 1002    Subjective  Paitent reported she was unable to get a second opinion as she is not 6 months post op. She reports she has been on gout medication that has improved her pain in her knee; continue therapy per MD.     Patient is accompained by:  Family member    Pertinent History  Arhritis; Migraines; osteopenia.    Diagnostic  tests  MRI and X-ray (See under "Imaging" tab)    Patient Stated Goals  Walk without pain.    Currently in Pain?  Yes   no pain scale provided        [facility] PT Assessment - 03/19/18 0001      Assessment   Medical Diagnosis  Acute medial meniscus tear, left.    Referring Provider (PT)  Rodell Perna MD    Next MD Visit  03/29/2018                   [facility] Adult PT Treatment/Exercise - 03/19/18 0001      Knee/Hip Exercises: Aerobic   Nustep  Level 4 x15 minutes UE/LE, seat 7 to 6      Electrical Stimulation   Electrical Stimulation Location  Left knee    Electrical Stimulation Action  IFC    Electrical Stimulation Parameters  1-10 hz x15 mins    Electrical Stimulation Goals  Edema;Pain      Vasopneumatic   Number Minutes Vasopneumatic   15 minutes    Vasopnuematic Location   Knee    Vasopneumatic Pressure  Low      Manual Therapy   Manual Therapy  Soft tissue mobilization;Joint mobilization  Joint Mobilization  patella mobs in all planes to improve mobility and ROM intermittent oscillations to prevent muscle guarding.    Soft tissue mobilization  STW to left knee to decrease pain                PT Short Term Goals - 11/13/17 1302      PT SHORT TERM GOAL #1   Title  STG's=LTG's.        PT Long Term Goals - 03/13/18 0900      PT LONG TERM GOAL #1   Title  independent with HEP    Time  8    Period  Weeks    Status  Achieved      PT LONG TERM GOAL #2   Title  Full left active knee extension in order to normalize gait.    Time  8    Period  Weeks    Status  Achieved      PT LONG TERM GOAL #3   Title  Active knee flexion to 120-125 degrees+ so the patient can perform functional tasks and do so with pain not > 2-3/10.    Time  8    Period  Weeks    Status  Not Met   AROM 100 degrees 03/13/18     PT LONG TERM GOAL #4   Title  Increase right knee strength to a solid 5/5 to provide good stability for accomplishment of functional activities.     Time  8    Period  Weeks    Status  Not Met   NT 03/13/18     PT LONG TERM GOAL #5   Title  Perform a reciprocating stair gait with one railing with pain not > 2-3/10.    Time  8    Period  Weeks    Status  Not Met   unable due to pain limitations 03/13/18           Plan - 03/19/18 1502    Clinical Impression Statement  Patient was able tolerate treatment well. Patient continues to have left knee pain and tightness at end range flexion. Patient noted with improved patella mobilization at end of manual therapy. Re-cert sent for 6 additional visits to cover weeks before next follow-up appointment.    Clinical Presentation  Stable    Clinical Decision Making  Low    Rehab Potential  Excellent    PT Frequency  2x / week    PT Duration  8 weeks    PT Treatment/Interventions  ADLs/Self Care Home Management;Cryotherapy;Occupational psychologist;Therapeutic activities;Therapeutic exercise;Neuromuscular re-education;Patient/family education;Passive range of motion;Vasopneumatic Device;Taping    PT Next Visit Plan  Continue POC of strengthening ROM and patella mobs, modalities PRN for pain relief.    Consulted and Agree with Plan of Care  Patient       Patient will benefit from skilled therapeutic intervention in order to improve the following deficits and impairments:  Pain, Abnormal gait, Decreased activity tolerance, Decreased strength, Decreased range of motion, Increased edema  Visit Diagnosis: Acute pain of left knee  Stiffness of left knee, not elsewhere classified  Localized edema     Problem List Patient Active Problem List   Diagnosis Date Noted  . Acute medial meniscal tear, left, initial encounter 12/07/2017  . Other cervical disc degeneration, unspecified cervical region 11/07/2017  . Constipation due to outlet dysfunction   . Osteopenia with high risk of fracture 02/05/2015  . BMI 26.0-26.9,adult 05/21/2014  .  Heel spur 05/21/2014  .  Migraines 12/20/2012  . Insomnia 12/20/2012  . Depression 12/20/2012  . GERD (gastroesophageal reflux disease) 12/20/2012  . Hyperlipidemia with target LDL less than 100 12/20/2012   Gabriela Eves, PT, DPT 03/19/2018, 3:17 PM  [facility] [place], [name], [address] Phone: [phone]   Fax:  [phone]  Name: Selena Barnes MRN: [id number] Date of Birth: [birth year]

## 2018-03-22 ENCOUNTER — Ambulatory Visit: Payer: Medicare Other | Admitting: Physical Therapy

## 2018-03-22 ENCOUNTER — Encounter: Payer: Self-pay | Admitting: Physical Therapy

## 2018-03-22 DIAGNOSIS — M25662 Stiffness of left knee, not elsewhere classified: Secondary | ICD-10-CM

## 2018-03-22 DIAGNOSIS — R600 Localized edema: Secondary | ICD-10-CM | POA: Diagnosis not present

## 2018-03-22 DIAGNOSIS — M25562 Pain in left knee: Secondary | ICD-10-CM

## 2018-03-22 NOTE — Therapy (Signed)
[facility] [place], [name], [address] Phone: [phone]   Fax:  [phone]  Physical Therapy Treatment  Patient Details  Name: Selena Barnes MRN: [id number] Date of Birth: 1950/05/30 Referring Provider (PT): Rodell Perna MD   Encounter Date: 03/22/2018  PT End of Session - 03/22/18 0859    Visit Number  22    Number of Visits  26    Date for PT Re-Evaluation  04/19/18    Authorization Type  FOTO AT LEAST EVERY 5TH VISIT, 10TH VISIT PROGRESS NOTE AND KX MODIFIER AFTER THE 15 VISIT.    PT Start Time  0902    PT Stop Time  0958    PT Time Calculation (min)  56 min    Activity Tolerance  Patient tolerated treatment well    Behavior During Therapy  [facility] for tasks assessed/performed       Past Medical History:  Diagnosis Date  . Arthritis   . Constipation   . Depression   . GERD (gastroesophageal reflux disease)   . Hyperlipidemia   . Insomnia   . Migraines   . Osteopenia     Past Surgical History:  Procedure Laterality Date  . ABDOMINAL HYSTERECTOMY    . ANAL RECTAL MANOMETRY N/A 10/06/2015   Procedure: ANO RECTAL MANOMETRY;  Surgeon: Gatha Mayer, MD;  Location: [facility];  Service: Endoscopy;  Laterality: N/A;  . BREAST BIOPSY Right   . BREAST EXCISIONAL BIOPSY Right    benign  . CATARACT EXTRACTION W/ INTRAOCULAR LENS IMPLANT Bilateral 07/27/2016   and 09/27/2016, [employer] in [place]  . CATARACT EXTRACTION, BILATERAL    . CHOLECYSTECTOMY    . LYMPH NODE BIOPSY Left    left arm    There were no vitals filed for this visit.  Subjective Assessment - 03/22/18 0858    Subjective  Reports that her knee is doing better since starting gout medication again.    Pertinent History  Arhritis; Migraines; osteopenia.    Diagnostic tests  MRI and X-ray (See under "Imaging" tab)    Patient Stated Goals  Walk without pain.    Currently in Pain?  Yes    Pain Score  1     Pain Location  Knee    Pain Orientation  Left;Medial    Pain Descriptors / Indicators  Discomfort    Pain Type  Surgical pain    Pain Onset  More than a month ago         [facility] [place] PT Assessment - 03/22/18 0001      Assessment   Medical Diagnosis  Acute medial meniscus tear, left.    Referring Provider (PT)  Rodell Perna MD    Next MD Visit  04/03/2018      Precautions   Precautions  Other (comment)    Precaution Comments  No blood clot per 02/19/2018 MD note      Restrictions   Weight Bearing Restrictions  No      ROM / Strength   AROM / PROM / Strength  AROM      AROM   Overall AROM   Within functional limits for tasks performed    AROM Assessment Site  Knee    Right/Left Knee  Left    Left Knee Extension  0    Left Knee Flexion  115                   [facility] Adult PT Treatment/Exercise - 03/22/18  0001      Knee/Hip Exercises: Aerobic   Nustep  L5 x15 min, seat 9-6      Knee/Hip Exercises: Standing   Forward Lunges  Left;15 reps;2 seconds    Forward Step Up  Left;2 sets;10 reps;Hand Hold: 2;Step Height: 6"      Knee/Hip Exercises: Supine   Heel Slides  AROM;Left;15 reps    Straight Leg Raises  Strengthening;Left;1 set;10 reps      Modalities   Modalities  Psychologist, educational Location  L knee    Electrical Stimulation Action  IFC    Electrical Stimulation Parameters  80-150 hz x15 min    Electrical Stimulation Goals  Edema;Pain      Vasopneumatic   Number Minutes Vasopneumatic   15 minutes    Vasopnuematic Location   Knee    Vasopneumatic Pressure  Low    Vasopneumatic Temperature   36      Manual Therapy   Manual Therapy  Soft tissue mobilization    Soft tissue mobilization  L patella mobilizations in sup/lat, med/lat directions               PT Short Term Goals - 11/13/17 1302      PT SHORT TERM GOAL #1   Title  STG's=LTG's.        PT Long Term Goals - 03/13/18 0900      PT LONG  TERM GOAL #1   Title  independent with HEP    Time  8    Period  Weeks    Status  Achieved      PT LONG TERM GOAL #2   Title  Full left active knee extension in order to normalize gait.    Time  8    Period  Weeks    Status  Achieved      PT LONG TERM GOAL #3   Title  Active knee flexion to 120-125 degrees+ so the patient can perform functional tasks and do so with pain not > 2-3/10.    Time  8    Period  Weeks    Status  Not Met   AROM 100 degrees 03/13/18     PT LONG TERM GOAL #4   Title  Increase right knee strength to a solid 5/5 to provide good stability for accomplishment of functional activities.    Time  8    Period  Weeks    Status  Not Met   NT 03/13/18     PT LONG TERM GOAL #5   Title  Perform a reciprocating stair gait with one railing with pain not > 2-3/10.    Time  8    Period  Weeks    Status  Not Met   unable due to pain limitations 03/13/18           Plan - 03/22/18 0940    Clinical Impression Statement  Patient presented in clinic with reports of low level L knee pain. Patient reported overall improvement since starting gout medication back. Patient able to complete more exercises today but still overall limited with L patella mobility. No L extensor lag noted during SLRs. AROM of L knee measured as 0-115 deg. Normal modalities response noted following removal of the modalities.    Rehab Potential  Excellent    PT Frequency  2x / week    PT Duration  8 weeks    PT Treatment/Interventions  ADLs/Self  Care Home Management;Cryotherapy;Occupational psychologist;Therapeutic activities;Therapeutic exercise;Neuromuscular re-education;Patient/family education;Passive range of motion;Vasopneumatic Device;Taping    PT Next Visit Plan  Continue POC of strengthening ROM and patella mobs, modalities PRN for pain relief.    Consulted and Agree with Plan of Care  Patient       Patient will benefit from skilled therapeutic intervention in  order to improve the following deficits and impairments:  Pain, Abnormal gait, Decreased activity tolerance, Decreased strength, Decreased range of motion, Increased edema  Visit Diagnosis: Acute pain of left knee  Stiffness of left knee, not elsewhere classified  Localized edema     Problem List Patient Active Problem List   Diagnosis Date Noted  . Acute medial meniscal tear, left, initial encounter 12/07/2017  . Other cervical disc degeneration, unspecified cervical region 11/07/2017  . Constipation due to outlet dysfunction   . Osteopenia with high risk of fracture 02/05/2015  . BMI 26.0-26.9,adult 05/21/2014  . Heel spur 05/21/2014  . Migraines 12/20/2012  . Insomnia 12/20/2012  . Depression 12/20/2012  . GERD (gastroesophageal reflux disease) 12/20/2012  . Hyperlipidemia with target LDL less than 100 12/20/2012    Standley Brooking, PTA 03/22/2018, 10:22 AM  [facility] [address] [place], [name], [address] Phone: [phone]   Fax:  [phone]  Name: AUNDRA PUNG MRN: [id number] Date of Birth: 07-22-1950

## 2018-03-25 ENCOUNTER — Encounter: Payer: Self-pay | Admitting: Physical Therapy

## 2018-03-25 ENCOUNTER — Ambulatory Visit: Payer: Medicare Other | Admitting: Physical Therapy

## 2018-03-25 DIAGNOSIS — M25562 Pain in left knee: Secondary | ICD-10-CM

## 2018-03-25 DIAGNOSIS — M25662 Stiffness of left knee, not elsewhere classified: Secondary | ICD-10-CM

## 2018-03-25 DIAGNOSIS — R600 Localized edema: Secondary | ICD-10-CM | POA: Diagnosis not present

## 2018-03-25 NOTE — Therapy (Signed)
[facility] [place], [name], [address] Phone: [phone]   Fax:  [phone]  Physical Therapy Treatment  Patient Details  Name: Selena Barnes MRN: [id number] Date of Birth: 10/04/1950 Referring Provider (PT): Rodell Perna MD   Encounter Date: 03/25/2018  PT End of Session - 03/25/18 1037    Visit Number  23    Number of Visits  26    Date for PT Re-Evaluation  04/19/18    Authorization Type  FOTO AT LEAST EVERY 5TH VISIT, 10TH VISIT PROGRESS NOTE AND KX MODIFIER AFTER THE 15 VISIT.    PT Start Time  1030    Activity Tolerance  Patient tolerated treatment well    Behavior During Therapy  [facility] for tasks assessed/performed       Past Medical History:  Diagnosis Date  . Arthritis   . Constipation   . Depression   . GERD (gastroesophageal reflux disease)   . Hyperlipidemia   . Insomnia   . Migraines   . Osteopenia     Past Surgical History:  Procedure Laterality Date  . ABDOMINAL HYSTERECTOMY    . ANAL RECTAL MANOMETRY N/A 10/06/2015   Procedure: ANO RECTAL MANOMETRY;  Surgeon: Gatha Mayer, MD;  Location: [facility];  Service: Endoscopy;  Laterality: N/A;  . BREAST BIOPSY Right   . BREAST EXCISIONAL BIOPSY Right    benign  . CATARACT EXTRACTION W/ INTRAOCULAR LENS IMPLANT Bilateral 07/27/2016   and 09/27/2016, [employer] in [place]  . CATARACT EXTRACTION, BILATERAL    . CHOLECYSTECTOMY    . LYMPH NODE BIOPSY Left    left arm    There were no vitals filed for this visit.  Subjective Assessment - 03/25/18 1036    Subjective  Patient reports not having any pain in her knee but continues to have ongoing stiffness at end range. Patient to see MD for follow up on 04/03/2018    Patient is accompained by:  Family member    Pertinent History  Arhritis; Migraines; osteopenia.    Diagnostic tests  MRI and X-ray (See under "Imaging" tab)    Patient Stated Goals  Walk without pain.    Currently in  Pain?  No/denies         [facility] PT Assessment - 03/25/18 0001      Assessment   Medical Diagnosis  Acute medial meniscus tear, left.    Referring Provider (PT)  Rodell Perna MD      Precautions   Precautions  Other (comment)    Precaution Comments  No blood clot per 02/19/2018 MD note      Restrictions   Weight Bearing Restrictions  No                   [facility] Adult PT Treatment/Exercise - 03/25/18 0001      Knee/Hip Exercises: Aerobic   Nustep  L5 x15 min, seat 9-6      Knee/Hip Exercises: Seated   Long Arc Quad  Left;2 sets;10 reps    Long Arc Quad Weight  1 lbs.    Clamshell with TheraBand  Red   x20   Hamstring Curl  Strengthening;Left;2 sets;10 reps      Acupuncturist Location  L knee    Electrical Stimulation Action  IFC    Electrical Stimulation Parameters  80-150 hz x15 mins      Vasopneumatic   Number Minutes Vasopneumatic   15 minutes  Vasopnuematic Location   Knee    Vasopneumatic Pressure  Low      Manual Therapy   Manual Therapy  Soft tissue mobilization    Joint Mobilization  patella mobs in all planes to improve mobility and ROM intermittent oscillations to prevent muscle guarding distraction in sitting to improve mobility    Soft tissue mobilization  STW/M to L quadriceps in varying degrees of flexion to improve ROM               PT Short Term Goals - 11/13/17 1302      PT SHORT TERM GOAL #1   Title  STG's=LTG's.        PT Long Term Goals - 03/13/18 0900      PT LONG TERM GOAL #1   Title  independent with HEP    Time  8    Period  Weeks    Status  Achieved      PT LONG TERM GOAL #2   Title  Full left active knee extension in order to normalize gait.    Time  8    Period  Weeks    Status  Achieved      PT LONG TERM GOAL #3   Title  Active knee flexion to 120-125 degrees+ so the patient can perform functional tasks and do so with pain not > 2-3/10.    Time  8    Period  Weeks     Status  Not Met   AROM 100 degrees 03/13/18     PT LONG TERM GOAL #4   Title  Increase right knee strength to a solid 5/5 to provide good stability for accomplishment of functional activities.    Time  8    Period  Weeks    Status  Not Met   NT 03/13/18     PT LONG TERM GOAL #5   Title  Perform a reciprocating stair gait with one railing with pain not > 2-3/10.    Time  8    Period  Weeks    Status  Not Met   unable due to pain limitations 03/13/18           Plan - 03/25/18 1114    Clinical Impression Statement  Patient was able to tolerate treatment well but continues to have ongoing tightness in anterior knee with end range flexion. Patella mobs completed and still demonstrates limitations in all planes. Joint distraction performed to improve ROM. Patient and PT discussed completing remaining visits with emphasis on patella mobs and PROM then discharge to the gym program. Patient reported understanding and agreement. Normal response to modalities upon removal.     Clinical Presentation  Stable    Clinical Decision Making  Low    Rehab Potential  Excellent    PT Frequency  2x / week    PT Duration  8 weeks    PT Treatment/Interventions  ADLs/Self Care Home Management;Cryotherapy;Occupational psychologist;Therapeutic activities;Therapeutic exercise;Neuromuscular re-education;Patient/family education;Passive range of motion;Vasopneumatic Device;Taping    PT Next Visit Plan  Nustep then emphasis on patella mobs and ROM, modalities for pain relief.    Consulted and Agree with Plan of Care  Patient       Patient will benefit from skilled therapeutic intervention in order to improve the following deficits and impairments:  Pain, Abnormal gait, Decreased activity tolerance, Decreased strength, Decreased range of motion, Increased edema  Visit Diagnosis: Acute pain of left knee  Stiffness of left knee, not  elsewhere classified  Localized  edema     Problem List Patient Active Problem List   Diagnosis Date Noted  . Acute medial meniscal tear, left, initial encounter 12/07/2017  . Other cervical disc degeneration, unspecified cervical region 11/07/2017  . Constipation due to outlet dysfunction   . Osteopenia with high risk of fracture 02/05/2015  . BMI 26.0-26.9,adult 05/21/2014  . Heel spur 05/21/2014  . Migraines 12/20/2012  . Insomnia 12/20/2012  . Depression 12/20/2012  . GERD (gastroesophageal reflux disease) 12/20/2012  . Hyperlipidemia with target LDL less than 100 12/20/2012   Gabriela Eves, PT, DPT 03/25/2018, 12:39 PM  [facility] [place], [name], [address] Phone: [phone]   Fax:  [phone]  Name: Selena Barnes MRN: [id number] Date of Birth: [birth year]

## 2018-03-29 ENCOUNTER — Ambulatory Visit: Payer: Medicare Other | Admitting: Physical Therapy

## 2018-03-29 ENCOUNTER — Encounter: Payer: Self-pay | Admitting: Physical Therapy

## 2018-03-29 DIAGNOSIS — M25562 Pain in left knee: Secondary | ICD-10-CM | POA: Diagnosis not present

## 2018-03-29 DIAGNOSIS — R600 Localized edema: Secondary | ICD-10-CM | POA: Diagnosis not present

## 2018-03-29 DIAGNOSIS — M25662 Stiffness of left knee, not elsewhere classified: Secondary | ICD-10-CM

## 2018-03-29 NOTE — Therapy (Signed)
[facility] [place], [name], [address] Phone: [phone]   Fax:  [phone]  Physical Therapy Treatment  Patient Details  Name: Selena Barnes MRN: [id number] Date of Birth: 02/07/1951 Referring Provider (PT): Rodell Perna MD   Encounter Date: 03/29/2018  PT End of Session - 03/29/18 0903    Visit Number  24    Number of Visits  26    Date for PT Re-Evaluation  04/19/18    Authorization Type  FOTO AT LEAST EVERY 5TH VISIT, 10TH VISIT PROGRESS NOTE AND KX MODIFIER AFTER THE 15 VISIT.    PT Start Time  0903    PT Stop Time  0951    PT Time Calculation (min)  48 min    Activity Tolerance  Patient tolerated treatment well    Behavior During Therapy  [facility] for tasks assessed/performed       Past Medical History:  Diagnosis Date  . Arthritis   . Constipation   . Depression   . GERD (gastroesophageal reflux disease)   . Hyperlipidemia   . Insomnia   . Migraines   . Osteopenia     Past Surgical History:  Procedure Laterality Date  . ABDOMINAL HYSTERECTOMY    . ANAL RECTAL MANOMETRY N/A 10/06/2015   Procedure: ANO RECTAL MANOMETRY;  Surgeon: Gatha Mayer, MD;  Location: [facility];  Service: Endoscopy;  Laterality: N/A;  . BREAST BIOPSY Right   . BREAST EXCISIONAL BIOPSY Right    benign  . CATARACT EXTRACTION W/ INTRAOCULAR LENS IMPLANT Bilateral 07/27/2016   and 09/27/2016, [employer] in [place]  . CATARACT EXTRACTION, BILATERAL    . CHOLECYSTECTOMY    . LYMPH NODE BIOPSY Left    left arm    There were no vitals filed for this visit.  Subjective Assessment - 03/29/18 0900    Subjective  Reports continued tightness across L superior knee.    Pertinent History  Arhritis; Migraines; osteopenia.    Diagnostic tests  MRI and X-ray (See under "Imaging" tab)    Patient Stated Goals  Walk without pain.    Currently in Pain?  Yes    Pain Score  1     Pain Location  Knee    Pain Orientation   Left;Upper    Pain Descriptors / Indicators  Tightness    Pain Type  Surgical pain    Pain Onset  More than a month ago    Pain Frequency  Intermittent         [facility] PT Assessment - 03/29/18 0001      Assessment   Medical Diagnosis  Acute medial meniscus tear, left.    Referring Provider (PT)  Rodell Perna MD      Precautions   Precautions  Other (comment)    Precaution Comments  No blood clot per 02/19/2018 MD note      Restrictions   Weight Bearing Restrictions  No                   [facility] Adult PT Treatment/Exercise - 03/29/18 0001      Knee/Hip Exercises: Aerobic   Stationary Bike  L2 x15 min       Knee/Hip Exercises: Standing   Forward Lunges  Left;15 reps;2 seconds    Functional Squat  15 reps      Modalities   Modalities  Electrical Stimulation;Vasopneumatic      Electrical Stimulation   Electrical Stimulation Location  L knee  Electrical Stimulation Action  IFC    Electrical Stimulation Parameters  80-150 hz x15 min    Electrical Stimulation Goals  Edema;Pain      Vasopneumatic   Number Minutes Vasopneumatic   15 minutes    Vasopnuematic Location   Knee    Vasopneumatic Pressure  Low    Vasopneumatic Temperature   34      Manual Therapy   Manual Therapy  Myofascial release    Myofascial Release  IASTW to distal quad, ITB to reduce tightness and adhesions               PT Short Term Goals - 11/13/17 1302      PT SHORT TERM GOAL #1   Title  STG's=LTG's.        PT Long Term Goals - 03/13/18 0900      PT LONG TERM GOAL #1   Title  independent with HEP    Time  8    Period  Weeks    Status  Achieved      PT LONG TERM GOAL #2   Title  Full left active knee extension in order to normalize gait.    Time  8    Period  Weeks    Status  Achieved      PT LONG TERM GOAL #3   Title  Active knee flexion to 120-125 degrees+ so the patient can perform functional tasks and do so with pain not > 2-3/10.    Time  8    Period  Weeks     Status  Not Met   AROM 100 degrees 03/13/18     PT LONG TERM GOAL #4   Title  Increase right knee strength to a solid 5/5 to provide good stability for accomplishment of functional activities.    Time  8    Period  Weeks    Status  Not Met   NT 03/13/18     PT LONG TERM GOAL #5   Title  Perform a reciprocating stair gait with one railing with pain not > 2-3/10.    Time  8    Period  Weeks    Status  Not Met   unable due to pain limitations 03/13/18           Plan - 03/29/18 0943    Clinical Impression Statement  Patient presented in clinic with continued tightness across superior R knee along the distal quad. Patient reports some R knee discomfort during stationary bike today. Intermittant reports of R knee discomfort with functional squats. Good squat technique noted with BUE support in parallel bars. IASTW completed to distal quad, ITB to reduce any tightness and adhesions with good redness response superior to L patella. Normal modalities response noted following removal of the modalities.     Rehab Potential  Excellent    PT Frequency  2x / week    PT Duration  8 weeks    PT Treatment/Interventions  ADLs/Self Care Home Management;Cryotherapy;Occupational psychologist;Therapeutic activities;Therapeutic exercise;Neuromuscular re-education;Patient/family education;Passive range of motion;Vasopneumatic Device;Taping    PT Next Visit Plan  Nustep then emphasis on patella mobs and ROM, modalities for pain relief.    Consulted and Agree with Plan of Care  Patient       Patient will benefit from skilled therapeutic intervention in order to improve the following deficits and impairments:  Pain, Abnormal gait, Decreased activity tolerance, Decreased strength, Decreased range of motion, Increased edema  Visit Diagnosis: Acute pain  of left knee  Stiffness of left knee, not elsewhere classified  Localized edema     Problem List Patient Active Problem  List   Diagnosis Date Noted  . Acute medial meniscal tear, left, initial encounter 12/07/2017  . Other cervical disc degeneration, unspecified cervical region 11/07/2017  . Constipation due to outlet dysfunction   . Osteopenia with high risk of fracture 02/05/2015  . BMI 26.0-26.9,adult 05/21/2014  . Heel spur 05/21/2014  . Migraines 12/20/2012  . Insomnia 12/20/2012  . Depression 12/20/2012  . GERD (gastroesophageal reflux disease) 12/20/2012  . Hyperlipidemia with target LDL less than 100 12/20/2012    Standley Brooking, PTA 03/29/2018, 9:58 AM  [facility] [address] [place], [name], [address] Phone: [phone]   Fax:  [phone]  Name: SADDIE SANDEEN MRN: [id number] Date of Birth: 1950/05/01

## 2018-04-01 ENCOUNTER — Encounter: Payer: Self-pay | Admitting: Physical Therapy

## 2018-04-01 ENCOUNTER — Ambulatory Visit: Payer: Medicare Other | Admitting: Physical Therapy

## 2018-04-01 DIAGNOSIS — M25662 Stiffness of left knee, not elsewhere classified: Secondary | ICD-10-CM

## 2018-04-01 DIAGNOSIS — M25562 Pain in left knee: Secondary | ICD-10-CM | POA: Diagnosis not present

## 2018-04-01 DIAGNOSIS — R600 Localized edema: Secondary | ICD-10-CM | POA: Diagnosis not present

## 2018-04-01 NOTE — Therapy (Signed)
[facility] [place], [name], [address] Phone: [phone]   Fax:  [phone]  Physical Therapy Treatment  Patient Details  Name: Selena Barnes MRN: [id number] Date of Birth: [birth year] Referring Provider (PT): Rodell Perna MD   Encounter Date: 04/01/2018  PT End of Session - 04/01/18 0906    Visit Number  25    Number of Visits  26    Date for PT Re-Evaluation  04/19/18    Authorization Type  FOTO AT LEAST EVERY 5TH VISIT, 10TH VISIT PROGRESS NOTE AND KX MODIFIER AFTER THE 15 VISIT.    PT Start Time  0901    PT Stop Time  0945    PT Time Calculation (min)  44 min    Activity Tolerance  Patient tolerated treatment well    Behavior During Therapy  WFL for tasks assessed/performed       Past Medical History:  Diagnosis Date  . Arthritis   . Constipation   . Depression   . GERD (gastroesophageal reflux disease)   . Hyperlipidemia   . Insomnia   . Migraines   . Osteopenia     Past Surgical History:  Procedure Laterality Date  . ABDOMINAL HYSTERECTOMY    . ANAL RECTAL MANOMETRY N/A 10/06/2015   Procedure: ANO RECTAL MANOMETRY;  Surgeon: Gatha Mayer, MD;  Location: [facility];  Service: Endoscopy;  Laterality: N/A;  . BREAST BIOPSY Right   . BREAST EXCISIONAL BIOPSY Right    benign  . CATARACT EXTRACTION W/ INTRAOCULAR LENS IMPLANT Bilateral 07/27/2016   and 09/27/2016, [employer] [facility] in [place]  . CATARACT EXTRACTION, BILATERAL    . CHOLECYSTECTOMY    . LYMPH NODE BIOPSY Left    left arm    There were no vitals filed for this visit.  Subjective Assessment - 04/01/18 0905    Subjective  Reports continued tightness across superior knee.    Pertinent History  Arhritis; Migraines; osteopenia.    Diagnostic tests  MRI and X-ray (See under "Imaging" tab)    Patient Stated Goals  Walk without pain.    Currently in Pain?  Other (Comment)   No pain assessment provided by patient        [facility]  PT Assessment - 04/01/18 0001      Assessment   Medical Diagnosis  Acute medial meniscus tear, left.    Referring Provider (PT)  Rodell Perna MD    Onset Date/Surgical Date  12/31/17    Hand Dominance  Right    Next MD Visit  04/03/2018      Precautions   Precautions  Other (comment)    Precaution Comments  No blood clot per 02/19/2018 MD note      Restrictions   Weight Bearing Restrictions  No                   [facility] Adult PT Treatment/Exercise - 04/01/18 0001      Knee/Hip Exercises: Stretches   Quad Stretch  Left;3 reps;30 seconds   in SL     Knee/Hip Exercises: Aerobic   Stationary Bike  L2 x10 min      Knee/Hip Exercises: Machines for Strengthening   Cybex Knee Extension  10# 2x10 reps    Cybex Knee Flexion  30# 2x10 reps      Modalities   Modalities  Psychologist, educational Location  L knee    Chartered certified accountant  IFC    Electrical Stimulation Parameters  80-150 hz x15 mn    Electrical Stimulation Goals  Pain      Vasopneumatic   Number Minutes Vasopneumatic   15 minutes    Vasopnuematic Location   Knee    Vasopneumatic Pressure  Low    Vasopneumatic Temperature   61      Manual Therapy   Manual Therapy  Myofascial release    Myofascial Release  IASTW to distal quad, ITB to reduce tightness and adhesions               PT Short Term Goals - 11/13/17 1302      PT SHORT TERM GOAL #1   Title  STG's=LTG's.        PT Long Term Goals - 03/13/18 0900      PT LONG TERM GOAL #1   Title  independent with HEP    Time  8    Period  Weeks    Status  Achieved      PT LONG TERM GOAL #2   Title  Full left active knee extension in order to normalize gait.    Time  8    Period  Weeks    Status  Achieved      PT LONG TERM GOAL #3   Title  Active knee flexion to 120-125 degrees+ so the patient can perform functional tasks and do so with pain not > 2-3/10.    Time  8     Period  Weeks    Status  Not Met   AROM 100 degrees 03/13/18     PT LONG TERM GOAL #4   Title  Increase right knee strength to a solid 5/5 to provide good stability for accomplishment of functional activities.    Time  8    Period  Weeks    Status  Not Met   NT 03/13/18     PT LONG TERM GOAL #5   Title  Perform a reciprocating stair gait with one railing with pain not > 2-3/10.    Time  8    Period  Weeks    Status  Not Met   unable due to pain limitations 03/13/18           Plan - 04/01/18 0939    Clinical Impression Statement  Patient presented in clinic with continued tightness across superior R knee. Patient progressed to machine strengthening in which she denied any knee pain only reporting muscle fatigue. Quad restriction noted during SL quad stretch. Redness response noted along R quad and especially just superior to R patella. Normal modalities response noted following removal of the modalities.    Rehab Potential  Excellent    PT Frequency  2x / week    PT Duration  8 weeks    PT Treatment/Interventions  ADLs/Self Care Home Management;Cryotherapy;Occupational psychologist;Therapeutic activities;Therapeutic exercise;Neuromuscular re-education;Patient/family education;Passive range of motion;Vasopneumatic Device;Taping    PT Next Visit Plan  Nustep then emphasis on patella mobs and ROM, modalities for pain relief.    Consulted and Agree with Plan of Care  Patient       Patient will benefit from skilled therapeutic intervention in order to improve the following deficits and impairments:  Pain, Abnormal gait, Decreased activity tolerance, Decreased strength, Decreased range of motion, Increased edema  Visit Diagnosis: Acute pain of left knee  Stiffness of left knee, not elsewhere classified  Localized edema     Problem List Patient Active  Problem List   Diagnosis Date Noted  . Acute medial meniscal tear, left, initial encounter  12/07/2017  . Other cervical disc degeneration, unspecified cervical region 11/07/2017  . Constipation due to outlet dysfunction   . Osteopenia with high risk of fracture 02/05/2015  . BMI 26.0-26.9,adult 05/21/2014  . Heel spur 05/21/2014  . Migraines 12/20/2012  . Insomnia 12/20/2012  . Depression 12/20/2012  . GERD (gastroesophageal reflux disease) 12/20/2012  . Hyperlipidemia with target LDL less than 100 12/20/2012    Standley Brooking, PTA 04/01/2018, 10:27 AM  [facility] [address] [place], [name], [address] Phone: [phone]   Fax:  [phone]  Name: Selena Barnes MRN: [id number] Date of Birth: 1950/12/02

## 2018-04-02 ENCOUNTER — Ambulatory Visit (INDEPENDENT_AMBULATORY_CARE_PROVIDER_SITE_OTHER): Payer: Medicare Other | Admitting: Orthopaedic Surgery

## 2018-04-03 ENCOUNTER — Telehealth (INDEPENDENT_AMBULATORY_CARE_PROVIDER_SITE_OTHER): Payer: Self-pay | Admitting: Orthopaedic Surgery

## 2018-04-03 ENCOUNTER — Ambulatory Visit (INDEPENDENT_AMBULATORY_CARE_PROVIDER_SITE_OTHER): Payer: Medicare Other | Admitting: Orthopaedic Surgery

## 2018-04-03 ENCOUNTER — Encounter (INDEPENDENT_AMBULATORY_CARE_PROVIDER_SITE_OTHER): Payer: Self-pay | Admitting: Orthopaedic Surgery

## 2018-04-03 VITALS — BP 109/72 | HR 82 | Ht 64.0 in | Wt 150.0 lb

## 2018-04-03 DIAGNOSIS — M11262 Other chondrocalcinosis, left knee: Secondary | ICD-10-CM

## 2018-04-03 MED ORDER — LIDOCAINE HCL 1 % IJ SOLN
0.5000 mL | INTRAMUSCULAR | Status: AC | PRN
  Administered 2018-04-03: .5 mL

## 2018-04-03 MED ORDER — METHYLPREDNISOLONE ACETATE 40 MG/ML IJ SUSP
40.0000 mg | INTRAMUSCULAR | Status: AC | PRN
  Administered 2018-04-03: 40 mg via INTRA_ARTICULAR

## 2018-04-03 MED ORDER — BUPIVACAINE HCL 0.5 % IJ SOLN
3.0000 mL | INTRAMUSCULAR | Status: AC | PRN
  Administered 2018-04-03: 3 mL via INTRA_ARTICULAR

## 2018-04-03 MED ORDER — MELOXICAM 15 MG PO TABS: 15.0000 mg | ORAL_TABLET | Freq: Every day | ORAL | Status: DC

## 2018-04-03 MED ORDER — MELOXICAM 15 MG PO TABS: 15.0000 mg | ORAL_TABLET | Freq: Every day | ORAL | 0 refills | Status: DC

## 2018-04-03 NOTE — Telephone Encounter (Signed)
Sent to pharmacy. Patient's husband advised.

## 2018-04-03 NOTE — Progress Notes (Signed)
Office Visit Note   Patient: Selena Barnes           Date of Birth: 30-Jun-1950           MRN: [id number] Visit Date: 04/03/2018              Requested by: Dettinger, Fransisca Kaufmann, MD [place], [place] [address] PCP: Dettinger, Fransisca Kaufmann, MD   Assessment & Plan: Visit Diagnoses:  1. Pseudogout of knee, left     Plan: Intra-articular injection performed.  Will start on some meloxicam.  If this helps with her symptoms we can send in a 90-day supply.  Plan to recheck in 1 month.  Follow-Up Instructions: No follow-ups on file.   Orders:  No orders of the defined types were placed in this encounter.  Meds ordered this encounter  Medications  . meloxicam (MOBIC) tablet 15 mg      Procedures: Large Joint Inj: L knee on 04/03/2018 9:50 AM Indications: joint swelling and pain Details: 22 G 1.5 in needle, anterolateral approach  Arthrogram: No  Medications: 0.5 mL lidocaine 1 %; 3 mL bupivacaine 0.5 %; 40 mg methylPREDNISolone acetate 40 MG/ML Outcome: tolerated well, no immediate complications Procedure, treatment alternatives, risks and benefits explained, specific risks discussed. Consent was given by the patient. Immediately prior to procedure a time out was called to verify the correct patient, procedure, equipment, support staff and site/side marked as required. Patient was prepped and draped in the usual sterile fashion.       Clinical Data: No additional findings.   Subjective: Chief Complaint  Patient presents with  . Left Knee - Follow-up    12/31/17 Left Knee Partial Meniscectomy    HPI 68 year old female returns post partial meniscectomy with postop DVT treated with Xarelto and follow-up Doppler test negative now off Xarelto since 02/15/2018.  She has been through extensive therapy still having problems with walking and has chondrocalcinosis with previous aspiration confirming pseudogout.  She is taking colchicine the point where she had some problems with  diarrhea and then stopped it.  She is not on an anti-inflammatory currently.  When her knee was more painful she started having aching pains in her feet ankles elbows shoulders.  She did get some relief with colchicine.  Review of Systems updated unchanged as a pertains HPI 14 point systems.   Objective: Vital Signs: BP 109/72   Pulse 82   Ht 5\' 4"  (1.626 m)   Wt 150 lb (68 kg)   BMI 25.75 kg/m   Physical Exam Constitutional:      Appearance: She is well-developed.  HENT:     Head: Normocephalic.     Right Ear: External ear normal.     Left Ear: External ear normal.  Eyes:     Pupils: Pupils are equal, round, and reactive to light.  Neck:     Thyroid: No thyromegaly.     Trachea: No tracheal deviation.  Cardiovascular:     Rate and Rhythm: Normal rate.  Pulmonary:     Effort: Pulmonary effort is normal.  Abdominal:     Palpations: Abdomen is soft.  Skin:    General: Skin is warm and dry.  Neurological:     Mental Status: She is alert and oriented to person, place, and time.  Psychiatric:        Behavior: Behavior normal.     Ortho Exam patient has 2+ knee effusion.  No erythema.  She is amatory with a left  knee limp.  Collateral ligaments are stable crepitus with knee extension and tenderness along the medial and lateral joint line.  Distal pulses are intact negative Homan.  No pitting edema.  Specialty Comments:  No specialty comments available.  Imaging: No results found.   PMFS History: Patient Active Problem List   Diagnosis Date Noted  . Acute medial meniscal tear, left, initial encounter 12/07/2017  . Other cervical disc degeneration, unspecified cervical region 11/07/2017  . Constipation due to outlet dysfunction   . Osteopenia with high risk of fracture 02/05/2015  . BMI 26.0-26.9,adult 05/21/2014  . Heel spur 05/21/2014  . Migraines 12/20/2012  . Insomnia 12/20/2012  . Depression 12/20/2012  . GERD (gastroesophageal reflux disease) 12/20/2012  .  Hyperlipidemia with target LDL less than 100 12/20/2012   Past Medical History:  Diagnosis Date  . Arthritis   . Constipation   . Depression   . GERD (gastroesophageal reflux disease)   . Hyperlipidemia   . Insomnia   . Migraines   . Osteopenia     Family History  Problem Relation Age of Onset  . Heart disease Mother 21       cause of death  . Heart disease Father 51       death  . Kidney disease Father   . Diabetes Brother   . Cancer Brother 70       bone marrow cancer  . Diabetes Grandchild        grandson    Past Surgical History:  Procedure Laterality Date  . ABDOMINAL HYSTERECTOMY    . ANAL RECTAL MANOMETRY N/A 10/06/2015   Procedure: ANO RECTAL MANOMETRY;  Surgeon: Gatha Mayer, MD;  Location: [facility];  Service: Endoscopy;  Laterality: N/A;  . BREAST BIOPSY Right   . BREAST EXCISIONAL BIOPSY Right    benign  . CATARACT EXTRACTION W/ INTRAOCULAR LENS IMPLANT Bilateral 07/27/2016   and 09/27/2016, [employer] in [place]  . CATARACT EXTRACTION, BILATERAL    . CHOLECYSTECTOMY    . LYMPH NODE BIOPSY Left    left arm   Social History   Occupational History  . Occupation: retired  Tobacco Use  . Smoking status: Never Smoker  . Smokeless tobacco: Never Used  Substance and Sexual Activity  . Alcohol use: No    Alcohol/week: 0.0 standard drinks  . Drug use: No  . Sexual activity: Yes

## 2018-04-03 NOTE — Telephone Encounter (Signed)
Patient's husband called stating that the RX for the Meloxicam needs to go to the [facility] in [place] instead of the [facility] in pharmacy.  Please call patient when the RX has been called into the pharmacy.  CB# [phone].  Thank you.

## 2018-04-05 ENCOUNTER — Encounter: Payer: Self-pay | Admitting: Physical Therapy

## 2018-04-05 ENCOUNTER — Ambulatory Visit: Payer: Medicare Other | Admitting: Physical Therapy

## 2018-04-05 DIAGNOSIS — M25562 Pain in left knee: Secondary | ICD-10-CM | POA: Diagnosis not present

## 2018-04-05 DIAGNOSIS — M25662 Stiffness of left knee, not elsewhere classified: Secondary | ICD-10-CM | POA: Diagnosis not present

## 2018-04-05 DIAGNOSIS — R600 Localized edema: Secondary | ICD-10-CM

## 2018-04-05 NOTE — Therapy (Signed)
[facility] [place], [name], [address] Phone: [phone]   Fax:  [phone]  Physical Therapy Treatment PHYSICAL THERAPY DISCHARGE SUMMARY  Visits from Start of Care: 26  Current functional level related to goals / functional outcomes: See  below   Remaining deficits: Strength goal partially met; all other goals met.   Education / Equipment: HEP Plan: Patient agrees to discharge.  Patient goals were partially met. Patient is being discharged due to meeting the stated rehab goals.  ?????     Gabriela Eves, PT, DPT  Patient Details  Name: Selena Barnes MRN: [id number] Date of Birth: June 03, 1950 Referring Provider (PT): Rodell Perna MD   Encounter Date: 04/05/2018  PT End of Session - 04/05/18 0911    Visit Number  26    Number of Visits  26    Date for PT Re-Evaluation  04/19/18    Authorization Type  FOTO AT LEAST EVERY 5TH VISIT, 10TH VISIT PROGRESS NOTE AND KX MODIFIER AFTER THE 15 VISIT.    PT Start Time  0900    PT Stop Time  1004    PT Time Calculation (min)  64 min    Activity Tolerance  Patient tolerated treatment well    Behavior During Therapy  WFL for tasks assessed/performed       Past Medical History:  Diagnosis Date  . Arthritis   . Constipation   . Depression   . GERD (gastroesophageal reflux disease)   . Hyperlipidemia   . Insomnia   . Migraines   . Osteopenia     Past Surgical History:  Procedure Laterality Date  . ABDOMINAL HYSTERECTOMY    . ANAL RECTAL MANOMETRY N/A 10/06/2015   Procedure: ANO RECTAL MANOMETRY;  Surgeon: Gatha Mayer, MD;  Location: [facility];  Service: Endoscopy;  Laterality: N/A;  . BREAST BIOPSY Right   . BREAST EXCISIONAL BIOPSY Right    benign  . CATARACT EXTRACTION W/ INTRAOCULAR LENS IMPLANT Bilateral 07/27/2016   and 09/27/2016, [employer] in [place]  . CATARACT EXTRACTION, BILATERAL    . CHOLECYSTECTOMY    . LYMPH NODE BIOPSY  Left    left arm    There were no vitals filed for this visit.  Subjective Assessment - 04/05/18 4332    Subjective  Patient she went to the doctor for a follow up and stated she had a gout flare up and was treated for that. She reports no pain in the knee at the moment.    Patient is accompained by:  Family member    Pertinent History  Arhritis; Migraines; osteopenia.    Diagnostic tests  MRI and X-ray (See under "Imaging" tab)    Patient Stated Goals  Walk without pain.    Currently in Pain?  No/denies         [facility] PT Assessment - 04/05/18 0001      Assessment   Medical Diagnosis  Acute medial meniscus tear, left.    Referring Provider (PT)  Rodell Perna MD    Onset Date/Surgical Date  12/31/17    Hand Dominance  Right    Next MD Visit  04/03/2018      Precautions   Precautions  Other (comment)    Precaution Comments  No blood clot per 02/19/2018 MD note      Restrictions   Weight Bearing Restrictions  No      AROM   Left Knee Extension  0    Left Knee Flexion  120      PROM   Left Knee Extension  0    Left Knee Flexion  122                   [facility] Adult PT Treatment/Exercise - 04/05/18 0001      Knee/Hip Exercises: Aerobic   Stationary Bike  --    Nustep  L5 x15 min, seat 9-5      Knee/Hip Exercises: Machines for Strengthening   Cybex Knee Extension  10# 2x10 reps    Cybex Knee Flexion  30# 2x10 reps      Modalities   Modalities  Electrical Stimulation;Vasopneumatic      Acupuncturist Location  L knee    Electrical Stimulation Action  IFC    Electrical Stimulation Parameters  80-150 hz x15 min    Electrical Stimulation Goals  Pain      Vasopneumatic   Number Minutes Vasopneumatic   15 minutes    Vasopnuematic Location   Knee    Vasopneumatic Pressure  Low    Vasopneumatic Temperature   61      Manual Therapy   Manual Therapy  Myofascial release    Joint Mobilization  patella mobs in all planes to improve  mobility and ROM intermittent oscillations to prevent muscle guarding distraction in sitting to improve mobility    Myofascial Release  IASTW to distal quad, ITB to reduce tightness and adhesions               PT Short Term Goals - 11/13/17 1302      PT SHORT TERM GOAL #1   Title  STG's=LTG's.        PT Long Term Goals - 04/05/18 0941      PT LONG TERM GOAL #1   Title  independent with HEP    Time  8    Status  Achieved      PT LONG TERM GOAL #2   Title  Full left active knee extension in order to normalize gait.    Time  8    Period  Weeks    Status  Achieved      PT LONG TERM GOAL #3   Title  Active knee flexion to 120-125 degrees+ so the patient can perform functional tasks and do so with pain not > 2-3/10.    Time  8    Period  Weeks    Status  Achieved      PT LONG TERM GOAL #4   Title  Increase right knee strength to a solid 5/5 to provide good stability for accomplishment of functional activities.    Period  Weeks    Status  Partially Met      PT LONG TERM GOAL #5   Title  Perform a reciprocating stair gait with one railing with pain not > 2-3/10.    Time  8    Period  Weeks    Status  Achieved            Plan - 04/05/18 1010    Clinical Impression Statement  Patient presents to physical therapy with no reports of increased pain. Patient was able to tolerate strengthening machines with no pain and improved movement. Patient has achieved all goals with exception of strength goal. Patient to join gym program to maintain gains from [place] therapy. Normal response to modalties upon removal.     Clinical Presentation  Stable    Clinical Decision  Making  Low    Rehab Potential  Excellent    PT Frequency  2x / week    PT Duration  8 weeks    PT Treatment/Interventions  ADLs/Self Care Home Management;Cryotherapy;Occupational psychologist;Therapeutic activities;Therapeutic exercise;Neuromuscular re-education;Patient/family  education;Passive range of motion;Vasopneumatic Device;Taping    PT Next Visit Plan  discharge.    Consulted and Agree with Plan of Care  Patient       Patient will benefit from skilled therapeutic intervention in order to improve the following deficits and impairments:  Pain, Abnormal gait, Decreased activity tolerance, Decreased strength, Decreased range of motion, Increased edema  Visit Diagnosis: Acute pain of left knee  Stiffness of left knee, not elsewhere classified  Localized edema     Problem List Patient Active Problem List   Diagnosis Date Noted  . Acute medial meniscal tear, left, initial encounter 12/07/2017  . Other cervical disc degeneration, unspecified cervical region 11/07/2017  . Constipation due to outlet dysfunction   . Osteopenia with high risk of fracture 02/05/2015  . BMI 26.0-26.9,adult 05/21/2014  . Heel spur 05/21/2014  . Migraines 12/20/2012  . Insomnia 12/20/2012  . Depression 12/20/2012  . GERD (gastroesophageal reflux disease) 12/20/2012  . Hyperlipidemia with target LDL less than 100 12/20/2012   Gabriela Eves, PT, DPT 04/05/2018, 10:12 AM  [facility] [place], [name], [address] Phone: [phone]   Fax:  [phone]  Name: Selena Barnes MRN: [id number] Date of Birth: [birth year]

## 2018-04-12 ENCOUNTER — Ambulatory Visit (INDEPENDENT_AMBULATORY_CARE_PROVIDER_SITE_OTHER): Payer: Medicare Other

## 2018-04-12 ENCOUNTER — Ambulatory Visit (INDEPENDENT_AMBULATORY_CARE_PROVIDER_SITE_OTHER): Payer: Medicare Other | Admitting: Family Medicine

## 2018-04-12 ENCOUNTER — Encounter: Payer: Self-pay | Admitting: Family Medicine

## 2018-04-12 VITALS — BP 115/64 | HR 87 | Temp 97.1°F | Ht 64.0 in | Wt 151.2 lb

## 2018-04-12 DIAGNOSIS — M810 Age-related osteoporosis without current pathological fracture: Secondary | ICD-10-CM

## 2018-04-12 DIAGNOSIS — Z6826 Body mass index (BMI) 26.0-26.9, adult: Secondary | ICD-10-CM

## 2018-04-12 DIAGNOSIS — K219 Gastro-esophageal reflux disease without esophagitis: Secondary | ICD-10-CM

## 2018-04-12 DIAGNOSIS — E785 Hyperlipidemia, unspecified: Secondary | ICD-10-CM

## 2018-04-12 DIAGNOSIS — F3342 Major depressive disorder, recurrent, in full remission: Secondary | ICD-10-CM | POA: Diagnosis not present

## 2018-04-12 NOTE — Progress Notes (Signed)
BP 115/64   Pulse 87   Temp (!) 97.1 F (36.2 C) (Oral)   Ht _0  (1.626 m)   Wt 151 lb 3.2 oz (68.6 kg)   BMI 25.95 kg/m    Subjective:    Patient ID: Selena Barnes, female    DOB: 1950/07/07, 68 y.o.   MRN: [id number]  HPI: Selena Barnes is a 68 y.o. female presenting on 04/12/2018 for Hyperlipidemia (6 month follow up)   HPI Hyperlipidemia Patient is coming in for recheck of his hyperlipidemia. The patient is currently taking Crestor. They deny any issues with myalgias or history of liver damage from it. They deny any focal numbness or weakness or chest pain.   GERD Patient is currently on Nexium.  She denies any major symptoms or abdominal pain or belching or burping. She denies any blood in her stool or lightheadedness or dizziness.   Major depressive disorder Patient currently takes Topamax and Cymbalta and says that everything is been going well with her for mood and she is just coming in for recheck today and is happy with everything and feels great about life and is happy with where these medications in the left ear.  Patient denies any suicidal ideations.  She would like to continue the medications. Depression screen [facility] 2/9 04/12/2018 10/12/2017 10/10/2017 05/03/2017 02/09/2017  Decreased Interest 0 0 0 0 0  Down, Depressed, Hopeless 0 0 2 1 0  PHQ - 2 Score 0 0 2 1 0  Altered sleeping - - 3 - -  Tired, decreased energy - - 3 - -  Change in appetite - - 3 - -  Feeling bad or failure about yourself  - - 0 - -  Trouble concentrating - - 3 - -  Moving slowly or fidgety/restless - - 0 - -  Suicidal thoughts - - 0 - -  PHQ-9 Score - - 14 - -  Difficult doing work/chores - - - - -    Patient is coming in for osteoporosis medication refill but she currently takes Evista and calcium and vitamin D and feels like things are going well for her.  She is due for repeat bone density scan  Relevant past medical, surgical, family and social history reviewed and updated as  indicated. Interim medical history since our last visit reviewed. Allergies and medications reviewed and updated.  Review of Systems  Constitutional: Negative for chills and fever.  HENT: Negative for congestion, ear discharge and ear pain.   Eyes: Negative for redness and visual disturbance.  Respiratory: Negative for chest tightness and shortness of breath.   Cardiovascular: Negative for chest pain and leg swelling.  Genitourinary: Negative for difficulty urinating and dysuria.  Musculoskeletal: Negative for back pain and gait problem.  Skin: Negative for rash.  Neurological: Negative for light-headedness and headaches.  Psychiatric/Behavioral: Negative for agitation, behavioral problems, dysphoric mood, self-injury, sleep disturbance and suicidal ideas. The patient is not nervous/anxious.   All other systems reviewed and are negative.   Per HPI unless specifically indicated above   Allergies as of 04/12/2018   No Known Allergies     Medication List       Accurate as of April 12, 2018  8:29 AM. Always use your most recent med list.        CITRACAL SLOW RELEASE PO Take 1,200 mg by mouth 2 (two) times daily before a meal.   colchicine 0.6 MG tablet Take 1 tablet (0.6 mg total) by mouth 2 (  two) times daily as needed.   DULoxetine 60 MG capsule Commonly known as:  CYMBALTA Take 1 capsule (60 mg total) by mouth 2 (two) times daily.   esomeprazole 40 MG capsule Commonly known as:  NEXIUM Take 1 capsule (40 mg total) by mouth daily at 12 noon.   estradiol 0.1 MG/GM vaginal cream Commonly known as:  ESTRACE VAGINAL Place 1 Applicatorful vaginally at bedtime.   fish oil-omega-3 fatty acids 1000 MG capsule Take 2 g by mouth 2 (two) times daily.   meloxicam 15 MG tablet Commonly known as:  MOBIC Take 1 tablet (15 mg total) by mouth daily.   promethazine 25 MG tablet Commonly known as:  PHENERGAN Take 1 tablet (25 mg total) by mouth every 6 (six) hours as needed for  nausea or vomiting.   raloxifene 60 MG tablet Commonly known as:  EVISTA Take 1 tablet (60 mg total) by mouth daily.   rizatriptan 10 MG disintegrating tablet Commonly known as:  MAXALT-MLT Take 1 tablet (10 mg total) by mouth as needed for migraine. May repeat in 2 hours if needed   rosuvastatin 20 MG tablet Commonly known as:  CRESTOR Take 1 tablet (20 mg total) by mouth daily.   topiramate 100 MG tablet Commonly known as:  TOPAMAX Take 1 tablet (100 mg total) by mouth daily.   Vitamin D3 50 MCG (2000 UT) capsule Take 2,000 Units by mouth 2 (two) times daily.   zolpidem 10 MG tablet Commonly known as:  AMBIEN Take 0.5 tablets (5 mg total) by mouth at bedtime as needed for sleep.          Objective:    BP 115/64   Pulse 87   Temp (!) 97.1 F (36.2 C) (Oral)   Ht _0  (1.626 m)   Wt 151 lb 3.2 oz (68.6 kg)   BMI 25.95 kg/m   Wt Readings from Last 3 Encounters:  04/12/18 151 lb 3.2 oz (68.6 kg)  04/03/18 150 lb (68 kg)  02/19/18 149 lb (67.6 kg)    Physical Exam Vitals signs and nursing note reviewed.  Constitutional:      General: She is not in acute distress.    Appearance: She is well-developed. She is not diaphoretic.  Eyes:     Conjunctiva/sclera: Conjunctivae normal.     Pupils: Pupils are equal, round, and reactive to light.  Cardiovascular:     Rate and Rhythm: Normal rate and regular rhythm.     Heart sounds: Normal heart sounds. No murmur.  Pulmonary:     Effort: Pulmonary effort is normal. No respiratory distress.     Breath sounds: Normal breath sounds. No wheezing.  Musculoskeletal: Normal range of motion.        General: No tenderness.  Skin:    General: Skin is warm and dry.     Findings: No rash.  Neurological:     Mental Status: She is alert and oriented to person, place, and time.     Coordination: Coordination normal.  Psychiatric:        Mood and Affect: Mood is not anxious or depressed.        Behavior: Behavior normal.         Thought Content: Thought content does not include suicidal ideation. Thought content does not include suicidal plan.         Assessment & Plan:   Problem List Items Addressed This Visit      Digestive   GERD (gastroesophageal reflux disease)  Relevant Orders   CMP14+EGFR (Completed)     Other   Depression   Hyperlipidemia with target LDL less than 100 - Primary   Relevant Orders   Lipid panel (Completed)   BMI 26.0-26.9,adult    Other Visit Diagnoses    Osteoporosis, unspecified osteoporosis type, unspecified pathological fracture presence       Relevant Orders   DG [facility] DEXA (Completed)      Will do DEXA scan today and will check blood work and continue medication that she has been on previously. Follow up plan: Return in about 6 months (around 10/11/2018), or if symptoms worsen or fail to improve, for Hyperlipidemia and depression recheck.  Counseling provided for all of the vaccine components Orders Placed This Encounter  Procedures  . DG [facility] DEXA  . Lipid panel  . Wisner Dettinger, MD [facility] 04/12/2018, 8:29 AM

## 2018-04-13 LAB — CMP14+EGFR
ALT: 35 IU/L — ABNORMAL HIGH (ref 0–32)
AST: 22 IU/L (ref 0–40)
Albumin/Globulin Ratio: 1.5 (ref 1.2–2.2)
Albumin: 4.2 g/dL (ref 3.8–4.8)
Alkaline Phosphatase: 65 IU/L (ref 39–117)
BUN/Creatinine Ratio: 23 (ref 12–28)
BUN: 17 mg/dL (ref 8–27)
Bilirubin Total: 0.3 mg/dL (ref 0.0–1.2)
CO2: 22 mmol/L (ref 20–29)
Calcium: 9.3 mg/dL (ref 8.7–10.3)
Chloride: 106 mmol/L (ref 96–106)
Creatinine, Ser: 0.75 mg/dL (ref 0.57–1.00)
GFR calc Af Amer: 95 mL/min/{1.73_m2} (ref >59)
GFR calc non Af Amer: 83 mL/min/{1.73_m2} (ref >59)
Globulin, Total: 2.8 g/dL (ref 1.5–4.5)
Glucose: 93 mg/dL (ref 65–99)
Potassium: 4.3 mmol/L (ref 3.5–5.2)
Sodium: 140 mmol/L (ref 134–144)
Total Protein: 7 g/dL (ref 6.0–8.5)

## 2018-04-13 LAB — LIPID PANEL
Chol/HDL Ratio: 4.5 ratio — ABNORMAL HIGH (ref 0.0–4.4)
Cholesterol, Total: 241 mg/dL — ABNORMAL HIGH (ref 100–199)
HDL: 54 mg/dL (ref >39)
LDL Calculated: 162 mg/dL — ABNORMAL HIGH (ref 0–99)
Triglycerides: 126 mg/dL (ref 0–149)
VLDL Cholesterol Cal: 25 mg/dL (ref 5–40)

## 2018-04-15 ENCOUNTER — Telehealth: Payer: Self-pay | Admitting: Family Medicine

## 2018-04-15 NOTE — Telephone Encounter (Signed)
Aware of lab results and recommendations  

## 2018-04-15 NOTE — Telephone Encounter (Signed)
Pt is rtn call about lab results

## 2018-05-01 ENCOUNTER — Ambulatory Visit (INDEPENDENT_AMBULATORY_CARE_PROVIDER_SITE_OTHER): Payer: Medicare Other | Admitting: Orthopaedic Surgery

## 2018-05-01 ENCOUNTER — Ambulatory Visit: Payer: Medicare Other | Admitting: Pharmacist Clinician (PhC)/ Clinical Pharmacy Specialist

## 2018-05-07 ENCOUNTER — Other Ambulatory Visit (INDEPENDENT_AMBULATORY_CARE_PROVIDER_SITE_OTHER): Payer: Self-pay | Admitting: Family Medicine

## 2018-05-07 ENCOUNTER — Other Ambulatory Visit (INDEPENDENT_AMBULATORY_CARE_PROVIDER_SITE_OTHER): Payer: Self-pay | Admitting: Orthopaedic Surgery

## 2018-05-08 NOTE — Telephone Encounter (Signed)
Please advise 

## 2018-07-16 ENCOUNTER — Other Ambulatory Visit (INDEPENDENT_AMBULATORY_CARE_PROVIDER_SITE_OTHER): Payer: Self-pay | Admitting: Family Medicine

## 2018-07-16 ENCOUNTER — Other Ambulatory Visit (INDEPENDENT_AMBULATORY_CARE_PROVIDER_SITE_OTHER): Payer: Self-pay | Admitting: Orthopaedic Surgery

## 2018-07-16 NOTE — Telephone Encounter (Signed)
Please advise 

## 2018-08-14 ENCOUNTER — Other Ambulatory Visit: Payer: Self-pay | Admitting: Family Medicine

## 2018-08-14 DIAGNOSIS — Z1231 Encounter for screening mammogram for malignant neoplasm of breast: Secondary | ICD-10-CM

## 2018-08-20 ENCOUNTER — Ambulatory Visit
Admission: RE | Admit: 2018-08-20 | Discharge: 2018-08-20 | Disposition: A | Discharge status: Home / Self Care | Payer: Medicare Other | Source: Ambulatory Visit | Attending: Family Medicine | Admitting: Family Medicine

## 2018-08-20 ENCOUNTER — Other Ambulatory Visit: Payer: Self-pay

## 2018-08-20 DIAGNOSIS — Z1231 Encounter for screening mammogram for malignant neoplasm of breast: Secondary | ICD-10-CM

## 2018-10-08 ENCOUNTER — Other Ambulatory Visit: Payer: Medicare Other

## 2018-10-08 ENCOUNTER — Other Ambulatory Visit: Payer: Self-pay

## 2018-10-08 DIAGNOSIS — E785 Hyperlipidemia, unspecified: Secondary | ICD-10-CM | POA: Diagnosis not present

## 2018-10-09 LAB — CMP14+EGFR
ALT: 19 IU/L (ref 0–32)
AST: 21 IU/L (ref 0–40)
Albumin/Globulin Ratio: 1.6 (ref 1.2–2.2)
Albumin: 4.2 g/dL (ref 3.8–4.8)
Alkaline Phosphatase: 57 IU/L (ref 39–117)
BUN/Creatinine Ratio: 19 (ref 12–28)
BUN: 13 mg/dL (ref 8–27)
Bilirubin Total: 0.2 mg/dL (ref 0.0–1.2)
CO2: 28 mmol/L (ref 20–29)
Calcium: 9.3 mg/dL (ref 8.7–10.3)
Chloride: 102 mmol/L (ref 96–106)
Creatinine, Ser: 0.68 mg/dL (ref 0.57–1.00)
GFR calc Af Amer: 105 mL/min/{1.73_m2} (ref >59)
GFR calc non Af Amer: 91 mL/min/{1.73_m2} (ref >59)
Globulin, Total: 2.6 g/dL (ref 1.5–4.5)
Glucose: 108 mg/dL — ABNORMAL HIGH (ref 65–99)
Potassium: 4.7 mmol/L (ref 3.5–5.2)
Sodium: 143 mmol/L (ref 134–144)
Total Protein: 6.8 g/dL (ref 6.0–8.5)

## 2018-10-09 LAB — CBC WITH DIFFERENTIAL/PLATELET
Basophils Absolute: 0.1 10*3/uL (ref 0.0–0.2)
Basos: 1 %
EOS (ABSOLUTE): 0.3 10*3/uL (ref 0.0–0.4)
Eos: 5 %
Hematocrit: 38 % (ref 34.0–46.6)
Hemoglobin: 12.6 g/dL (ref 11.1–15.9)
Immature Grans (Abs): 0 10*3/uL (ref 0.0–0.1)
Immature Granulocytes: 0 %
Lymphocytes Absolute: 2.2 10*3/uL (ref 0.7–3.1)
Lymphs: 31 %
MCH: 30.6 pg (ref 26.6–33.0)
MCHC: 33.2 g/dL (ref 31.5–35.7)
MCV: 92 fL (ref 79–97)
Monocytes Absolute: 0.5 10*3/uL (ref 0.1–0.9)
Monocytes: 7 %
Neutrophils Absolute: 4.2 10*3/uL (ref 1.4–7.0)
Neutrophils: 56 %
Platelets: 255 10*3/uL (ref 150–450)
RBC: 4.12 x10E6/uL (ref 3.77–5.28)
RDW: 11.8 % (ref 11.7–15.4)
WBC: 7.3 10*3/uL (ref 3.4–10.8)

## 2018-10-09 LAB — LIPID PANEL
Chol/HDL Ratio: 2.5 ratio (ref 0.0–4.4)
Cholesterol, Total: 139 mg/dL (ref 100–199)
HDL: 56 mg/dL (ref >39)
LDL Calculated: 65 mg/dL (ref 0–99)
Triglycerides: 88 mg/dL (ref 0–149)
VLDL Cholesterol Cal: 18 mg/dL (ref 5–40)

## 2018-10-09 LAB — TSH: TSH: 2.72 u[IU]/mL (ref 0.450–4.500)

## 2018-10-11 ENCOUNTER — Other Ambulatory Visit: Payer: Self-pay

## 2018-10-11 ENCOUNTER — Ambulatory Visit (INDEPENDENT_AMBULATORY_CARE_PROVIDER_SITE_OTHER): Payer: Medicare Other | Admitting: Family Medicine

## 2018-10-11 NOTE — Progress Notes (Signed)
Patient did not answer recall twice and left a message, no return

## 2018-10-18 ENCOUNTER — Encounter: Payer: Self-pay | Admitting: Family Medicine

## 2018-10-18 ENCOUNTER — Ambulatory Visit (INDEPENDENT_AMBULATORY_CARE_PROVIDER_SITE_OTHER): Payer: Medicare Other | Admitting: Family Medicine

## 2018-10-18 ENCOUNTER — Other Ambulatory Visit: Payer: Self-pay

## 2018-10-18 DIAGNOSIS — F3342 Major depressive disorder, recurrent, in full remission: Secondary | ICD-10-CM

## 2018-10-18 DIAGNOSIS — E785 Hyperlipidemia, unspecified: Secondary | ICD-10-CM

## 2018-10-18 DIAGNOSIS — K219 Gastro-esophageal reflux disease without esophagitis: Secondary | ICD-10-CM | POA: Diagnosis not present

## 2018-10-18 DIAGNOSIS — Z6826 Body mass index (BMI) 26.0-26.9, adult: Secondary | ICD-10-CM

## 2018-10-18 MED ORDER — PROMETHAZINE HCL 25 MG PO TABS: 25.0000 mg | ORAL_TABLET | Freq: Four times a day (QID) | ORAL | 3 refills | Status: DC | PRN

## 2018-10-18 MED ORDER — ESOMEPRAZOLE MAGNESIUM 40 MG PO CPDR: 40.0000 mg | DELAYED_RELEASE_CAPSULE | Freq: Every day | ORAL | 3 refills | Status: DC

## 2018-10-18 MED ORDER — RIZATRIPTAN BENZOATE 10 MG PO TBDP: 10.0000 mg | ORAL_TABLET | ORAL | 3 refills | Status: DC | PRN

## 2018-10-18 MED ORDER — RALOXIFENE HCL 60 MG PO TABS: 60.0000 mg | ORAL_TABLET | Freq: Every day | ORAL | 3 refills | Status: DC

## 2018-10-18 MED ORDER — DULOXETINE HCL 60 MG PO CPEP: 60.0000 mg | ORAL_CAPSULE | Freq: Two times a day (BID) | ORAL | 3 refills | Status: DC

## 2018-10-18 MED ORDER — ESTRADIOL 0.1 MG/GM VA CREA: 1.0000 | TOPICAL_CREAM | Freq: Every day | VAGINAL | 3 refills | Status: DC

## 2018-10-18 MED ORDER — ROSUVASTATIN CALCIUM 20 MG PO TABS: 20.0000 mg | ORAL_TABLET | Freq: Every day | ORAL | 3 refills | Status: DC

## 2018-10-18 MED ORDER — MIRTAZAPINE 30 MG PO TABS: 30.0000 mg | ORAL_TABLET | Freq: Every day | ORAL | 3 refills | Status: DC

## 2018-10-18 MED ORDER — TOPIRAMATE 100 MG PO TABS: 100.0000 mg | ORAL_TABLET | Freq: Every day | ORAL | 3 refills | Status: DC

## 2018-10-18 NOTE — Progress Notes (Signed)
Virtual Visit via telephone Note  I connected with Selena Barnes on 10/18/18 at [place] by telephone and verified that I am speaking with the correct person using two identifiers. Selena Barnes is currently located at home and no other people are currently with her during visit. The provider, Fransisca Kaufmann Antrice Pal, MD is located in their office at time of visit.  Call ended at [phone]  I discussed the limitations, risks, security and privacy concerns of performing an evaluation and management service by telephone and the availability of in person appointments. I also discussed with the patient that there may be a patient responsible charge related to this service. The patient expressed understanding and agreed to proceed.   History and Present Illness: Patient is having more daytime symptoms with anxiety and more nighttime and not sleeping and trying melatonin and it is not working, she came off [place]. Denies any suicidal ideations. Coronavirus is causing a lot more anxiety. Her migraines are under control and she is having them one per week but much milder than they used to.  Hyperlipidemia Patient is coming in for recheck of his hyperlipidemia. The patient is currently taking crestor. They deny any issues with myalgias or history of liver damage from it. They deny any focal numbness or weakness or chest pain.   GERD  Patient is currently on nexium.  She denies any major symptoms or abdominal pain or belching or burping. She denies any blood in her stool or lightheadedness or dizziness.   No diagnosis found.  Outpatient Encounter Medications as of 10/18/2018  Medication Sig  . Calcium-Magnesium-Vitamin D (CITRACAL SLOW RELEASE PO) Take 1,200 mg by mouth 2 (two) times daily before a meal.  . Cholecalciferol (VITAMIN D3) 2000 UNITS capsule Take 2,000 Units by mouth 2 (two) times daily.  . colchicine 0.6 MG tablet Take 1 tablet by mouth twice daily as needed  . DULoxetine (CYMBALTA) 60 MG capsule  Take 1 capsule (60 mg total) by mouth 2 (two) times daily.  Marland Kitchen esomeprazole (NEXIUM) 40 MG capsule Take 1 capsule (40 mg total) by mouth daily at 12 noon.  Marland Kitchen estradiol (ESTRACE VAGINAL) 0.1 MG/GM vaginal cream Place 1 Applicatorful vaginally at bedtime.  . fish oil-omega-3 fatty acids 1000 MG capsule Take 2 g by mouth 2 (two) times daily.   . meloxicam (MOBIC) 15 MG tablet Take 1 tablet by mouth once daily  . promethazine (PHENERGAN) 25 MG tablet Take 1 tablet (25 mg total) by mouth every 6 (six) hours as needed for nausea or vomiting.  . raloxifene (EVISTA) 60 MG tablet Take 1 tablet (60 mg total) by mouth daily.  . rizatriptan (MAXALT-MLT) 10 MG disintegrating tablet Take 1 tablet (10 mg total) by mouth as needed for migraine. May repeat in 2 hours if needed  . rosuvastatin (CRESTOR) 20 MG tablet Take 1 tablet (20 mg total) by mouth daily.  Marland Kitchen topiramate (TOPAMAX) 100 MG tablet Take 1 tablet (100 mg total) by mouth daily.  Marland Kitchen zolpidem (AMBIEN) 10 MG tablet Take 0.5 tablets (5 mg total) by mouth at bedtime as needed for sleep.   Facility-Administered Encounter Medications as of 10/18/2018  Medication  . meloxicam (MOBIC) tablet 15 mg  . methylPREDNISolone acetate (DEPO-MEDROL) injection 40 mg    Review of Systems  Constitutional: Negative for chills and fever.  Eyes: Negative for visual disturbance.  Respiratory: Negative for chest tightness and shortness of breath.   Cardiovascular: Negative for chest pain and leg swelling.  Gastrointestinal: Positive  for abdominal pain (Patient does get some occasional heartburn still).  Genitourinary: Negative for difficulty urinating, dysuria, vaginal bleeding, vaginal discharge and vaginal pain.  Musculoskeletal: Negative for back pain and gait problem.  Skin: Negative for rash.  Neurological: Negative for dizziness, light-headedness and headaches.  Psychiatric/Behavioral: Negative for agitation and behavioral problems.  All other systems reviewed and  are negative.   Observations/Objective: Patient sounds comfortable and in no acute distress  Assessment and Plan: Problem List Items Addressed This Visit      Digestive   GERD (gastroesophageal reflux disease)   Relevant Medications   esomeprazole (NEXIUM) 40 MG capsule     Other   Depression   Relevant Medications   DULoxetine (CYMBALTA) 60 MG capsule   mirtazapine (REMERON) 30 MG tablet   Hyperlipidemia with target LDL less than 100   Relevant Medications   rosuvastatin (CRESTOR) 20 MG tablet   BMI 26.0-26.9,adult - Primary       Follow Up Instructions: Follow-up in 1 month to see if new medication is working for for depression  Added Remeron to her regiment, continue Cymbalta.  Her blood work looks great from a week ago so no other changes in medication.  If she needs help with more heartburn help I gave her some dietary advice and also recommended that she could use Pepcid as needed.    I discussed the assessment and treatment plan with the patient. The patient was provided an opportunity to ask questions and all were answered. The patient agreed with the plan and demonstrated an understanding of the instructions.   The patient was advised to call back or seek an in-person evaluation if the symptoms worsen or if the condition fails to improve as anticipated.  The above assessment and management plan was discussed with the patient. The patient verbalized understanding of and has agreed to the management plan. Patient is aware to call the clinic if symptoms persist or worsen. Patient is aware when to return to the clinic for a follow-up visit. Patient educated on when it is appropriate to go to the emergency department.    I provided 17 minutes of non-face-to-face time during this encounter.    Worthy Rancher, MD

## 2018-10-23 ENCOUNTER — Ambulatory Visit (INDEPENDENT_AMBULATORY_CARE_PROVIDER_SITE_OTHER): Payer: Medicare Other | Admitting: *Deleted

## 2018-10-23 DIAGNOSIS — Z0000 Encounter for general adult medical examination without abnormal findings: Secondary | ICD-10-CM

## 2018-10-23 NOTE — Patient Instructions (Signed)
Preventive Care 38 Years and Older, Female Preventive care refers to lifestyle choices and visits with your health care provider that can promote health and wellness. This includes:  A yearly physical exam. This is also called an annual well check.  Regular dental and eye exams.  Immunizations.  Screening for certain conditions.  Healthy lifestyle choices, such as diet and exercise. What can I expect for my preventive care visit? Physical exam Your health care provider will check:  Height and weight. These may be used to calculate body mass index (BMI), which is a measurement that tells if you are at a healthy weight.  Heart rate and blood pressure.  Your skin for abnormal spots. Counseling Your health care provider may ask you questions about:  Alcohol, tobacco, and drug use.  Emotional well-being.  Home and relationship well-being.  Sexual activity.  Eating habits.  History of falls.  Memory and ability to understand (cognition).  Work and work Statistician.  Pregnancy and menstrual history. What immunizations do I need?  Influenza (flu) vaccine  This is recommended every year. Tetanus, diphtheria, and pertussis (Tdap) vaccine  You may need a Td booster every 10 years. Varicella (chickenpox) vaccine  You may need this vaccine if you have not already been vaccinated. Zoster (shingles) vaccine  You may need this after age 33. Pneumococcal conjugate (PCV13) vaccine  One dose is recommended after age 33. Pneumococcal polysaccharide (PPSV23) vaccine  One dose is recommended after age 72. Measles, mumps, and rubella (MMR) vaccine  You may need at least one dose of MMR if you were born in 1957 or later. You may also need a second dose. Meningococcal conjugate (MenACWY) vaccine  You may need this if you have certain conditions. Hepatitis A vaccine  You may need this if you have certain conditions or if you travel or work in places where you may be exposed  to hepatitis A. Hepatitis B vaccine  You may need this if you have certain conditions or if you travel or work in places where you may be exposed to hepatitis B. Haemophilus influenzae type b (Hib) vaccine  You may need this if you have certain conditions. You may receive vaccines as individual doses or as more than one vaccine together in one shot (combination vaccines). Talk with your health care provider about the risks and benefits of combination vaccines. What tests do I need? Blood tests  Lipid and cholesterol levels. These may be checked every 5 years, or more frequently depending on your overall health.  Hepatitis C test.  Hepatitis B test. Screening  Lung cancer screening. You may have this screening every year starting at age 39 if you have a 30-pack-year history of smoking and currently smoke or have quit within the past 15 years.  Colorectal cancer screening. All adults should have this screening starting at age 36 and continuing until age 15. Your health care provider may recommend screening at age 23 if you are at increased risk. You will have tests every 1-10 years, depending on your results and the type of screening test.  Diabetes screening. This is done by checking your blood sugar (glucose) after you have not eaten for a while (fasting). You may have this done every 1-3 years.  Mammogram. This may be done every 1-2 years. Talk with your health care provider about how often you should have regular mammograms.  BRCA-related cancer screening. This may be done if you have a family history of breast, ovarian, tubal, or peritoneal cancers.  Other tests  Sexually transmitted disease (STD) testing.  Bone density scan. This is done to screen for osteoporosis. You may have this done starting at age 55. Follow these instructions at home: Eating and drinking  Eat a diet that includes fresh fruits and vegetables, whole grains, lean protein, and low-fat dairy products. Limit  your intake of foods with high amounts of sugar, saturated fats, and salt.  Take vitamin and mineral supplements as recommended by your health care provider.  Do not drink alcohol if your health care provider tells you not to drink.  If you drink alcohol: ? Limit how much you have to 0-1 drink a day. ? Be aware of how much alcohol is in your drink. In the [place] one drink equals one 12 oz bottle of beer (355 mL), one 5 oz glass of wine (148 mL), or one 1 oz glass of hard liquor (44 mL). Lifestyle  Take daily care of your teeth and gums.  Stay active. Exercise for at least 30 minutes on 5 or more days each week.  Do not use any products that contain nicotine or tobacco, such as cigarettes, e-cigarettes, and chewing tobacco. If you need help quitting, ask your health care provider.  If you are sexually active, practice safe sex. Use a condom or other form of protection in order to prevent STIs (sexually transmitted infections).  Talk with your health care provider about taking a low-dose aspirin or statin. What's next?  Go to your health care provider once a year for a well check visit.  Ask your health care provider how often you should have your eyes and teeth checked.  Stay up to date on all vaccines. This information is not intended to replace advice given to you by your health care provider. Make sure you discuss any questions you have with your health care provider. Document Released: 02/26/2015 Document Revised: 01/24/2018 Document Reviewed: 01/24/2018 Elsevier Patient Education  2020 [employer].

## 2018-10-23 NOTE — Progress Notes (Signed)
MEDICARE ANNUAL WELLNESS VISIT  10/23/2018  Telephone Visit Disclaimer This Medicare AWV was conducted by telephone due to national recommendations for restrictions regarding the COVID-19 Pandemic (e.g. social distancing).  I verified, using two identifiers, that I am speaking with Selena Barnes or their authorized healthcare agent. I discussed the limitations, risks, security, and privacy concerns of performing an evaluation and management service by telephone and the potential availability of an in-person appointment in the future. The patient expressed understanding and agreed to proceed.   Subjective:  Selena Barnes is a 68 y.o. female patient of Dettinger, Fransisca Kaufmann, MD who had a Medicare Annual Wellness Visit today via telephone. Selena Barnes is Retired and lives with their spouse. she has 1 child. she reports that she is socially active and does interact with friends/family regularly. she is minimally physically active and enjoys doing jigsaw puzzles and doing Sudoku.  Patient Care Team: Dettinger, Fransisca Kaufmann, MD as PCP - General (Family Medicine) Gatha Mayer, MD as Consulting Physician (Gastroenterology)  Advanced Directives 10/23/2018 01/23/2018 11/27/2016 11/18/2015 11/10/2015 11/03/2014  Does Patient Have a Medical Advance Directive? Yes Yes Yes Yes Yes No  Type of Paramedic of [place];Living will - [place];Living will [place];Living will [place];Living will -  Does patient want to make changes to medical advance directive? No - Patient declined - No - Patient declined No - Patient declined - -  Copy of [place] in Chart? No - copy requested - Yes Yes Yes -  Would patient like information on creating a medical advance directive? - - - - - No - patient declined information    Hospital Utilization Over the Past 12 Months: # of hospitalizations or ER visits: 0 # of surgeries: 1   Review of Systems    Patient reports that her overall health is better compared to last year.  History obtained from chart review  Patient Reported Readings (BP, Pulse, CBG, Weight, etc) none  Pain Assessment Pain : No/denies pain     Current Medications & Allergies (verified) Allergies as of 10/23/2018   No Known Allergies     Medication List       Accurate as of October 23, 2018  2:51 PM. If you have any questions, ask your nurse or doctor.        CITRACAL SLOW RELEASE PO Take 1,200 mg by mouth 2 (two) times daily before a meal.   colchicine 0.6 MG tablet Take 1 tablet by mouth twice daily as needed   DULoxetine 60 MG capsule Commonly known as: CYMBALTA Take 1 capsule (60 mg total) by mouth 2 (two) times daily.   esomeprazole 40 MG capsule Commonly known as: NEXIUM Take 1 capsule (40 mg total) by mouth daily at 12 noon.   estradiol 0.1 MG/GM vaginal cream Commonly known as: ESTRACE VAGINAL Place 1 Applicatorful vaginally at bedtime.   fish oil-omega-3 fatty acids 1000 MG capsule Take 2 g by mouth 2 (two) times daily.   meloxicam 15 MG tablet Commonly known as: MOBIC Take 1 tablet by mouth once daily   mirtazapine 30 MG tablet Commonly known as: Remeron Take 1 tablet (30 mg total) by mouth at bedtime.   promethazine 25 MG tablet Commonly known as: PHENERGAN Take 1 tablet (25 mg total) by mouth every 6 (six) hours as needed for nausea or vomiting.   raloxifene 60 MG tablet Commonly known as: Evista Take 1  tablet (60 mg total) by mouth daily.   rizatriptan 10 MG disintegrating tablet Commonly known as: Maxalt-MLT Take 1 tablet (10 mg total) by mouth as needed for migraine. May repeat in 2 hours if needed   rosuvastatin 20 MG tablet Commonly known as: Crestor Take 1 tablet (20 mg total) by mouth daily.   topiramate 100 MG tablet Commonly known as: TOPAMAX Take 1 tablet (100 mg total) by mouth daily.   Vitamin D3 50 MCG (2000 UT) capsule Take  2,000 Units by mouth 2 (two) times daily.       History (reviewed): Past Medical History:  Diagnosis Date  . Arthritis   . Constipation   . Depression   . GERD (gastroesophageal reflux disease)   . Hyperlipidemia   . Insomnia   . Migraines   . Osteopenia    Past Surgical History:  Procedure Laterality Date  . ABDOMINAL HYSTERECTOMY    . ANAL RECTAL MANOMETRY N/A 10/06/2015   Procedure: ANO RECTAL MANOMETRY;  Surgeon: Gatha Mayer, MD;  Location: [facility];  Service: Endoscopy;  Laterality: N/A;  . BREAST BIOPSY Right   . BREAST EXCISIONAL BIOPSY Right    benign  . BREAST EXCISIONAL BIOPSY Left    axillary area  . CATARACT EXTRACTION W/ INTRAOCULAR LENS IMPLANT Bilateral 07/27/2016   and 09/27/2016, [facility] in [place]  . CATARACT EXTRACTION, BILATERAL    . CHOLECYSTECTOMY    . knee surgery Left   . LYMPH NODE BIOPSY Left    left arm   Family History  Problem Relation Age of Onset  . Heart disease Mother 62       cause of death  . Heart disease Father 73       death  . Kidney disease Father   . Diabetes Brother   . Cancer Brother 70       bone marrow cancer  . Diabetes Grandchild        grandson   Social History   Socioeconomic History  . Marital status: Married    Spouse name: Laveda Abbe  . Number of children: 1  . Years of education: 84  . Highest education level: High school graduate  Occupational History  . Occupation: retired  Scientific laboratory technician  . Financial resource strain: Not hard at all  . Food insecurity    Worry: Never true    Inability: Never true  . Transportation needs    Medical: No    Non-medical: No  Tobacco Use  . Smoking status: Never Smoker  . Smokeless tobacco: Never Used  Substance and Sexual Activity  . Alcohol use: No    Alcohol/week: 0.0 standard drinks  . Drug use: No  . Sexual activity: Yes  Lifestyle  . Physical activity    Days per week: 5 days    Minutes per session: 60 min  . Stress: Rather much   Relationships  . Social connections    Talks on phone: More than three times a week    Gets together: More than three times a week    Attends religious service: More than 4 times per year    Active member of club or organization: Yes    Attends meetings of clubs or organizations: More than 4 times per year    Relationship status: Married  Other Topics Concern  . Not on file  Social History Narrative  . Not on file    Activities of Daily Living In your present state of health, do you  have any difficulty performing the following activities: 10/23/2018  Hearing? N  Vision? N  Comment wears glasses and gets yearly eye exam  Difficulty concentrating or making decisions? N  Walking or climbing stairs? Y  Comment going up the stairs is easier but coming down the stairs she has to go slow and hang onto the rail  Dressing or bathing? N  Doing errands, shopping? N  Preparing Food and eating ? N  Using the Toilet? N  In the past six months, have you accidently leaked urine? N  Do you have problems with loss of bowel control? N  Managing your Medications? N  Managing your Finances? N  Housekeeping or managing your Housekeeping? N  Some recent data might be hidden    Patient Education/ Literacy How often do you need to have someone help you when you read instructions, pamphlets, or other written materials from your doctor or pharmacy?: 1 - Never What is the last grade level you completed in school?: 12th grade  Exercise Current Exercise Habits: Home exercise routine, Type of exercise: walking, Time (Minutes): 60, Frequency (Times/Week): 6, Weekly Exercise (Minutes/Week): 360, Intensity: Mild, Exercise limited by: orthopedic condition(s)  Diet Patient reports consuming 1 meals a day and 2 snack(s) a day Patient reports that her primary diet is: Regular Patient reports that she does have regular access to food.   Depression Screen PHQ 2/9 Scores 10/23/2018 04/12/2018 10/12/2017 10/10/2017  05/03/2017 02/09/2017 11/14/2016  PHQ - 2 Score 0 0 0 2 1 0 0  PHQ- 9 Score - - - 14 - - -     Fall Risk Fall Risk  10/23/2018 04/12/2018 10/12/2017 05/03/2017 02/09/2017  Falls in the past year? 0 0 No No No  Number falls in past yr: 0 - - - -  Injury with Fall? 0 - - - -  Follow up Falls prevention discussed - - - -  Comment get rid of all throw rugs in the house, adequate lighting in the walkways and grab bars in the bathroom - - - -     Objective:  Collie Siad Severtson seemed alert and oriented and she participated appropriately during our telephone visit.  Blood Pressure Weight BMI  BP Readings from Last 3 Encounters:  04/12/18 115/64  04/03/18 109/72  02/19/18 120/75   Wt Readings from Last 3 Encounters:  04/12/18 151 lb 3.2 oz (68.6 kg)  04/03/18 150 lb (68 kg)  02/19/18 149 lb (67.6 kg)   BMI Readings from Last 1 Encounters:  04/12/18 25.95 kg/m    *Unable to obtain current vital signs, weight, and BMI due to telephone visit type  Hearing/Vision  . Tanisa did not seem to have difficulty with hearing/understanding during the telephone conversation . Reports that she has had a formal eye exam by an eye care professional within the past year . Reports that she has not had a formal hearing evaluation within the past year *Unable to fully assess hearing and vision during telephone visit type  Cognitive Function: 6CIT Screen 10/23/2018  What Year? 0 points  What month? 0 points  What time? 0 points  Count back from 20 0 points  Months in reverse 0 points  Repeat phrase 0 points  Total Score 0   (Normal:0-7, Significant for Dysfunction: >8)  Normal Cognitive Function Screening: Yes   Immunization & Health Maintenance Record Immunization History  Administered Date(s) Administered  . Influenza, High Dose Seasonal PF 11/14/2016, 01/18/2018  . Influenza,inj,Quad PF,6+ Mos 12/20/2012, 01/22/2014, 11/13/2014,  11/08/2015  . Pneumococcal Conjugate-13 10/11/2016  . Pneumococcal  Polysaccharide-23 01/18/2018  . Tdap 11/10/2015  . Zoster 06/20/2013    Health Maintenance  Topic Date Due  . INFLUENZA VACCINE  09/14/2018  . MAMMOGRAM  08/19/2020  . COLONOSCOPY  07/26/2025  . TETANUS/TDAP  11/09/2025  . DEXA SCAN  Completed  . Hepatitis C Screening  Completed  . PNA vac Low Risk Adult  Completed       Assessment  This is a routine wellness examination for [employer].  Health Maintenance: Due or Overdue Health Maintenance Due  Topic Date Due  . INFLUENZA VACCINE  09/14/2018    Collie Siad Gammel does not need a referral for Community Assistance: Care Management:   no Social Work:    no Prescription Assistance:  no Nutrition/Diabetes Education:  no   Plan:  Personalized Goals Goals Addressed            This Visit's Progress   . DIET - INCREASE WATER INTAKE       Try to drink 6-8 glasses of water daily.      Personalized Health Maintenance & Screening Recommendations  Influenza vaccine  Lung Cancer Screening Recommended: no (Low Dose CT Chest recommended if Age 23-80 years, 30 pack-year currently smoking OR have quit w/in past 15 years) Hepatitis C Screening recommended: no HIV Screening recommended: no  Advanced Directives: Written information was not prepared per patient's request.  Referrals & Orders No orders of the defined types were placed in this encounter.   Follow-up Plan . Follow-up with Dettinger, Fransisca Kaufmann, MD as planned . Bring a copy of your Advanced Directives in for our records . Get your Flu vaccine as scheduled   I have personally reviewed and noted the following in the patient's chart:   . Medical and social history . Use of alcohol, tobacco or illicit drugs  . Current medications and supplements . Functional ability and status . Nutritional status . Physical activity . Advanced directives . List of other physicians . Hospitalizations, surgeries, and ER visits in previous 12 months . Vitals . Screenings to  include cognitive, depression, and falls . Referrals and appointments  In addition, I have reviewed and discussed with Collie Siad Mclarty certain preventive protocols, quality metrics, and best practice recommendations. A written personalized care plan for preventive services as well as general preventive health recommendations is available and can be mailed to the patient at her request.      Marylin Crosby, LPN  [phone]

## 2018-11-06 ENCOUNTER — Other Ambulatory Visit: Payer: Self-pay

## 2018-11-06 ENCOUNTER — Ambulatory Visit (INDEPENDENT_AMBULATORY_CARE_PROVIDER_SITE_OTHER): Payer: Medicare Other

## 2018-11-06 DIAGNOSIS — Z23 Encounter for immunization: Secondary | ICD-10-CM | POA: Diagnosis not present

## 2018-12-04 ENCOUNTER — Ambulatory Visit: Payer: Self-pay | Admitting: Family Medicine

## 2018-12-31 ENCOUNTER — Telehealth: Payer: Self-pay | Admitting: Family Medicine

## 2019-01-01 NOTE — Telephone Encounter (Signed)
Patient aware and appt scheduled.

## 2019-01-01 NOTE — Telephone Encounter (Signed)
Is this new, I do not know that I have documented anywhere about this, please schedule a visit for her, it can be virtual if we can find anything else.

## 2019-01-02 ENCOUNTER — Ambulatory Visit (INDEPENDENT_AMBULATORY_CARE_PROVIDER_SITE_OTHER): Payer: Medicare Other | Admitting: Family Medicine

## 2019-01-02 ENCOUNTER — Encounter: Payer: Self-pay | Admitting: Family Medicine

## 2019-01-02 DIAGNOSIS — G589 Mononeuropathy, unspecified: Secondary | ICD-10-CM | POA: Diagnosis not present

## 2019-01-02 MED ORDER — PREDNISONE 20 MG PO TABS: ORAL_TABLET | ORAL | 0 refills | Status: DC

## 2019-01-02 NOTE — Progress Notes (Signed)
Virtual Visit via telephone Note  I connected with Selena Barnes on 01/02/19 at 1420 by telephone and verified that I am speaking with the correct person using two identifiers. Selena Barnes is currently located at home and no other people are currently with her during visit. The provider, Fransisca Kaufmann Jakin Pavao, MD is located in their office at time of visit.  Call ended at 1435  I discussed the limitations, risks, security and privacy concerns of performing an evaluation and management service by telephone and the availability of in person appointments. I also discussed with the patient that there may be a patient responsible charge related to this service. The patient expressed understanding and agreed to proceed.   History and Present Illness: Patient is calling in complaining of her arm tingling like pins and needles and a crick in her neck in the right side.  It started in her upper arm and then works down to her right hand.  She often awakens with numbness in both hands and her right hand is worsen.  She is losing grip strength in the right hand and dropping things. She has no feeling in her fingers of the right hand.  The left hand happens to be improving during the days. She started with this 9 months ago.  She has pain in her neck on that side.   No diagnosis found.  Outpatient Encounter Medications as of 01/02/2019  Medication Sig  . Calcium-Magnesium-Vitamin D (CITRACAL SLOW RELEASE PO) Take 1,200 mg by mouth 2 (two) times daily before a meal.  . Cholecalciferol (VITAMIN D3) 2000 UNITS capsule Take 2,000 Units by mouth 2 (two) times daily.  . colchicine 0.6 MG tablet Take 1 tablet by mouth twice daily as needed (Patient not taking: Reported on 10/23/2018)  . DULoxetine (CYMBALTA) 60 MG capsule Take 1 capsule (60 mg total) by mouth 2 (two) times daily.  Marland Kitchen esomeprazole (NEXIUM) 40 MG capsule Take 1 capsule (40 mg total) by mouth daily at 12 noon.  Marland Kitchen estradiol (ESTRACE VAGINAL) 0.1 MG/GM  vaginal cream Place 1 Applicatorful vaginally at bedtime.  . fish oil-omega-3 fatty acids 1000 MG capsule Take 2 g by mouth 2 (two) times daily.   . meloxicam (MOBIC) 15 MG tablet Take 1 tablet by mouth once daily (Patient not taking: Reported on 10/23/2018)  . mirtazapine (REMERON) 30 MG tablet Take 1 tablet (30 mg total) by mouth at bedtime. (Patient not taking: Reported on 10/23/2018)  . promethazine (PHENERGAN) 25 MG tablet Take 1 tablet (25 mg total) by mouth every 6 (six) hours as needed for nausea or vomiting.  . raloxifene (EVISTA) 60 MG tablet Take 1 tablet (60 mg total) by mouth daily.  . rizatriptan (MAXALT-MLT) 10 MG disintegrating tablet Take 1 tablet (10 mg total) by mouth as needed for migraine. May repeat in 2 hours if needed  . rosuvastatin (CRESTOR) 20 MG tablet Take 1 tablet (20 mg total) by mouth daily.  Marland Kitchen topiramate (TOPAMAX) 100 MG tablet Take 1 tablet (100 mg total) by mouth daily.   Facility-Administered Encounter Medications as of 01/02/2019  Medication  . meloxicam (MOBIC) tablet 15 mg  . methylPREDNISolone acetate (DEPO-MEDROL) injection 40 mg    Review of Systems  Constitutional: Negative for chills and fever.  Eyes: Negative for visual disturbance.  Respiratory: Negative for chest tightness and shortness of breath.   Cardiovascular: Negative for chest pain and leg swelling.  Musculoskeletal: Positive for neck pain. Negative for back pain and gait problem.  Skin: Negative for rash.  Neurological: Positive for weakness and numbness. Negative for light-headedness and headaches.  Psychiatric/Behavioral: Negative for agitation and behavioral problems.  All other systems reviewed and are negative.   Observations/Objective: Patient sounds comfortable and in no acute distress  Assessment and Plan: Problem List Items Addressed This Visit    None    Visit Diagnoses    Nerve compression syndrome    -  Primary   Relevant Orders   Ambulatory referral to Neurology        Follow Up Instructions:  Patient sounds like she might have bilateral carpal tunnel syndrome but then it also sounds like she might have a nerve impingement coming from the spine.  She did have an MRI last year that showed possible nerve root impingement at the C4-5 level, it sounds like she may have neuropathy or radiculopathy from her right neck down to her arm.  I think an EMG and neurology referral is probably in order, will do neurology referral and they can determine if they need an EMG at that point.   I discussed the assessment and treatment plan with the patient. The patient was provided an opportunity to ask questions and all were answered. The patient agreed with the plan and demonstrated an understanding of the instructions.   The patient was advised to call back or seek an in-person evaluation if the symptoms worsen or if the condition fails to improve as anticipated.  The above assessment and management plan was discussed with the patient. The patient verbalized understanding of and has agreed to the management plan. Patient is aware to call the clinic if symptoms persist or worsen. Patient is aware when to return to the clinic for a follow-up visit. Patient educated on when it is appropriate to go to the emergency department.    I provided 15 minutes of non-face-to-face time during this encounter.    Worthy Rancher, MD

## 2019-01-30 ENCOUNTER — Telehealth: Payer: Self-pay | Admitting: Family Medicine

## 2019-01-31 NOTE — Telephone Encounter (Signed)
Patient notified and verbalized understanding. 

## 2019-01-31 NOTE — Telephone Encounter (Signed)
Patient went to Urgent Care on Sunday and was diagnosed a few days later with Covid. They prescribed her amoxicillan and a nasal spray. She currently only has cough and congestion but no SOB. Do you think she needs any additional meds? Please advise

## 2019-01-31 NOTE — Telephone Encounter (Signed)
Now she does not, unless things worsen or not improving. I would recommend that she can use Mucinex and continue with the nasal spray.
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
## 2019-02-26 ENCOUNTER — Other Ambulatory Visit: Payer: Self-pay

## 2019-02-26 ENCOUNTER — Ambulatory Visit (INDEPENDENT_AMBULATORY_CARE_PROVIDER_SITE_OTHER): Payer: Medicare Other | Admitting: Neurology

## 2019-02-26 ENCOUNTER — Encounter: Payer: Self-pay | Admitting: Neurology

## 2019-02-26 VITALS — BP 118/77 | HR 101 | Temp 97.6°F | Ht 64.0 in | Wt 169.0 lb

## 2019-02-26 DIAGNOSIS — G5603 Carpal tunnel syndrome, bilateral upper limbs: Secondary | ICD-10-CM

## 2019-02-26 NOTE — Patient Instructions (Signed)
Electromyoneurogram Electromyoneurogram is a test to check how well your muscles and nerves are working. This procedure includes the combined use of electromyogram (EMG) and nerve conduction study (NCS). EMG is used to look for muscular disorders. NCS, which is also called electroneurogram, measures how well your nerves are controlling your muscles. The procedures are usually done together to check if your muscles and nerves are healthy. If the results of the tests are abnormal, this may indicate disease or injury, such as a neuromuscular disease or peripheral nerve damage. Tell a health care provider about:  Any allergies you have.  All medicines you are taking, including vitamins, herbs, eye drops, creams, and over-the-counter medicines.  Any problems you or family members have had with anesthetic medicines.  Any blood disorders you have.  Any surgeries you have had.  Any medical conditions you have.  If you have a pacemaker.  Whether you are pregnant or may be pregnant. What are the risks? Generally, this is a safe procedure. However, problems may occur, including:  Infection where the electrodes were inserted.  Bleeding. What happens before the procedure? Medicines Ask your health care provider about:  Changing or stopping your regular medicines. This is especially important if you are taking diabetes medicines or blood thinners.  Taking medicines such as aspirin and ibuprofen. These medicines can thin your blood. Do not take these medicines unless your health care provider tells you to take them.  Taking over-the-counter medicines, vitamins, herbs, and supplements. General instructions  Your health care provider may ask you to avoid: ? Beverages that have caffeine, such as coffee and tea. ? Any products that contain nicotine or tobacco. These products include cigarettes, e-cigarettes, and chewing tobacco. If you need help quitting, ask your health care provider.  Do not  use lotions or creams on the same day that you will be having the procedure. What happens during the procedure? For EMG   Your health care provider will ask you to stay in a position so that he or she can access the muscle that will be studied. You may be standing, sitting, or lying down.  You may be given a medicine that numbs the area (local anesthetic).  A very thin needle that has an electrode will be inserted into your muscle.  Another small electrode will be placed on your skin near the muscle.  Your health care provider will ask you to continue to remain still.  The electrodes will send a signal that tells about the electrical activity of your muscles. You may see this on a monitor or hear it in the room.  After your muscles have been studied at rest, your health care provider will ask you to contract or flex your muscles. The electrodes will send a signal that tells about the electrical activity of your muscles.  Your health care provider will remove the electrodes and the electrode needles when the procedure is finished. The procedure may vary among health care providers and hospitals. For NCS   An electrode that records your nerve activity (recording electrode) will be placed on your skin by the muscle that is being studied.  An electrode that is used as a reference (reference electrode) will be placed near the recording electrode.  A paste or gel will be applied to your skin between the recording electrode and the reference electrode.  Your nerve will be stimulated with a mild shock. Your health care provider will measure how much time it takes for your muscle to react.    Your health care provider will remove the electrodes and the gel when the procedure is finished. The procedure may vary among health care providers and hospitals. What happens after the procedure?  It is up to you to get the results of your procedure. Ask your health care provider, or the department  that is doing the procedure, when your results will be ready.  Your health care provider may: ? Give you medicines for any pain. ? Monitor the insertion sites to make sure that bleeding stops. Summary  Electromyoneurogram is a test to check how well your muscles and nerves are working.  If the results of the tests are abnormal, this may indicate disease or injury.  This is a safe procedure. However, problems may occur, such as bleeding and infection.  Your health care provider will do two tests to complete this procedure. One checks your muscles (EMG) and another checks your nerves (NCS).  It is up to you to get the results of your procedure. Ask your health care provider, or the department that is doing the procedure, when your results will be ready. This information is not intended to replace advice given to you by your health care provider. Make sure you discuss any questions you have with your health care provider. Document Revised: 10/16/2017 Document Reviewed: 09/28/2017 Elsevier Patient Education  2020 [employer]vier Inc.  

## 2019-03-02 NOTE — Progress Notes (Signed)
Full Name: Selena Barnes Gender: Female MRN #: [id number] Date of Birth: 1950-04-20    Visit Date: 03/03/2019 09:56 Age: 69 Years Examining Physician: Sarina Ill, MD  Referring Physician: Caryl Pina, MD  History: Patient with weakness and sensory changes in a right c6 distribution, depressed right triceps reflex, MRI cervical spine shows multilevel degenereative changes and broad disk bulge  At c5-c6. Also has signs and symptoms of bilateral Carpal Tunnel Syndrome. Here for EMG/NCS for further evaluation.  Summary:  Nerve Conduction Studies were performed on the bilateral upper extremities.  The right median APB motor nerve showed prolonged distal onset latency (5.8 ms, N<4.4) and reduced amplitude(2.19mV, N>4) The left  median APB motor nerve showed prolonged distal onset latency (7.4 ms, N<4.4) The right Median 2nd Digit orthodromic sensory nerve showed prolonged distal peak latency (3.48ms, N<3.4) and reduced amplitude(4 uV, N>10) The left  Median 2nd Digit orthodromic sensory nerve showed prolonged distal peak latency (4.1 ms, N<3.4) and reduced amplitude(4 uV, N>10)  The right median/ulnar (palm) comparison nerve showed prolonged distal peak latency (Median Palm, 3.01 ms, N<2.2) and abnormal peak latency difference (Median Palm-Ulnar Palm, 1.1 ms, N<0.4) with a relative median delay.    The left median/ulnar (palm) comparison nerve showed prolonged distal peak latency (Median Palm, 3.3 ms, N<2.2) and abnormal peak latency difference (Median Palm-Ulnar Palm, 1.5 ms, N<0.4) with a relative median delay.    All remaining nerves (as indicated in the following tables) were within normal limits.     The right pronator teres showed increased spontaneous activity, increased motor unit amplitude and diminished motor unit recruitment.  The left opponens pollicis showed increased spontaneous activity, increased motor unit amplitude, 1+ polyphasic motor units, diminished motor unit  recruitment.  The right opponens pollicis showed increased spontaneous activity and diminished motor unit recruitment. All remaining  muscles (as indicated in the following tables) were within normal limits.       Conclusion:  1. There is electrophysiologic evidence of bilateral severe Carpal Tunnel Syndrome.  No suggestion of polyneuropathy. 2.  There is acute/ongoing denervation and chronic neurogenic changes in a right C6-C7 innervated muscle which may be consistent with right cervical radiculopathy given supporting clinical and radiographic data.  Sarina Ill M.D.  [facility] [place], [place] [address] Tel: [phone] Fax: [phone]         [facility]    Nerve / Sites Muscle Latency Ref. Amplitude Ref. Rel Amp Segments Distance Velocity Ref. Area    ms ms mV mV %  cm m/s m/s mVms  L Median - APB     Wrist APB 7.4 ?4.4 4.2 ?4.0 100 Wrist - APB 7   15.2     Upper arm APB 11.4  4.0  95.7 Upper arm - Wrist 21 54 ?49 14.3  R Median - APB     Wrist APB 5.8 ?4.4 2.9 ?4.0 100 Wrist - APB 7   8.9     Upper arm APB 10.1  2.6  90.4 Upper arm - Wrist 21 49 ?49 8.9  L Ulnar - ADM     Wrist ADM 2.3 ?3.3 11.0 ?6.0 100 Wrist - ADM 7   28.3     B.Elbow ADM 5.3  9.3  84.6 B.Elbow - Wrist 18 61 ?49 28.3     A.Elbow ADM 7.1  9.7  104 A.Elbow - B.Elbow 10 54 ?49 28.1         A.Elbow - Wrist  R Ulnar - ADM     Wrist ADM 2.5 ?3.3 8.7 ?6.0 100 Wrist - ADM 7   21.5     B.Elbow ADM 5.5  7.7  88 B.Elbow - Wrist 18 60 ?49 21.4     A.Elbow ADM 7.1  8.5  111 A.Elbow - B.Elbow 10 60 ?49 23.0         A.Elbow - Wrist                 SNC    Nerve / Sites Rec. Site Peak Lat Ref.  Amp Ref. Segments Distance Peak Diff Ref.    ms ms V V  cm ms ms  L Median, Ulnar - Transcarpal comparison     Median Palm Wrist 3.3 ?2.2 30 ?35 Median Palm - Wrist 8       Ulnar Palm Wrist 1.8 ?2.2 19 ?12 Ulnar Palm - Wrist 8          Median Palm - Ulnar Palm  1.5 ?0.4  R Median, Ulnar -  Transcarpal comparison     Median Palm Wrist 3.0 ?2.2 40 ?35 Median Palm - Wrist 8       Ulnar Palm Wrist 1.9 ?2.2 18 ?12 Ulnar Palm - Wrist 8          Median Palm - Ulnar Palm  1.1 ?0.4  L Median - Orthodromic (Dig II, Mid palm)     Dig II Wrist 4.1 ?3.4 4 ?10 Dig II - Wrist 13    R Median - Orthodromic (Dig II, Mid palm)     Dig II Wrist 3.9 ?3.4 4 ?10 Dig II - Wrist 13    L Ulnar - Orthodromic, (Dig V, Mid palm)     Dig V Wrist 2.7 ?3.1 5 ?5 Dig V - Wrist 11    R Ulnar - Orthodromic, (Dig V, Mid palm)     Dig V Wrist 2.6 ?3.1 5 ?5 Dig V - Wrist 60                   F  Wave    Nerve F Lat Ref.   ms ms  L Ulnar - ADM 26.9 ?32.0  R Ulnar - ADM 26.0 ?32.0         EMG Summary Table    Spontaneous MUAP Recruitment  Muscle IA Fib PSW Fasc Other Amp Dur. Poly Pattern  L. Deltoid Normal None None None _______ Normal Normal Normal Normal  R. Deltoid Normal None None None _______ Normal Normal Normal Normal  L. Triceps brachii Normal None None None _______ Normal Normal Normal Normal  R. Triceps brachii Normal None None None _______ Normal Normal Normal Normal  L. Pronator teres Normal None None None _______ Normal Normal Normal Normal  R. Pronator teres Normal None 2+ None _______ Increased Normal Normal Reduced  L. Biceps brachii Normal None None None _______ Normal Normal Normal Normal  R. Biceps brachii Normal None None None _______ Normal Normal Normal Normal  L. First dorsal interosseous Normal None None None _______ Normal Normal Normal Normal  R. First dorsal interosseous Normal None None None _______ Normal Normal Normal Normal  L. Opponens pollicis Normal None 3+ None _______ Increased Normal 1+ Reduced  R. Opponens pollicis Normal None 3+ None _______ Normal Normal Normal Reduced  L. Cervical paraspinals (low) Normal None None None _______ Normal Normal Normal Normal  R. Cervical paraspinals (low) Normal None None None _______ Normal Normal Normal Normal

## 2019-03-03 ENCOUNTER — Ambulatory Visit (INDEPENDENT_AMBULATORY_CARE_PROVIDER_SITE_OTHER): Payer: Medicare Other | Admitting: Neurology

## 2019-03-03 ENCOUNTER — Other Ambulatory Visit: Payer: Self-pay

## 2019-03-03 DIAGNOSIS — Z0289 Encounter for other administrative examinations: Secondary | ICD-10-CM

## 2019-03-03 DIAGNOSIS — G5603 Carpal tunnel syndrome, bilateral upper limbs: Secondary | ICD-10-CM

## 2019-03-03 DIAGNOSIS — M5412 Radiculopathy, cervical region: Secondary | ICD-10-CM

## 2019-03-03 NOTE — Progress Notes (Signed)
Absolutely lovely patient with weakness and sensory changes in a right c6 distribution, depressed right triceps reflex, MRI cervical spine shows multilevel degenereative changes and broad disk bulge  At c5-c6. I also think she has bilateral right > left Carpal Tunnel Syndrome. Need emg/ns   I reviewed emg/ncs findings with patient. She has severe bilateral carpal tunnel syndrome. She also has acute/ongoing denervation and chronic neurogenic changes  in a right c6 muscle c/w right c6 radic with supporting physical findings and MRI correlation. I discussed CTS, pathophysiology and advised conservative measures. She has a lof of right cervical muscle pain and I do think this is also due to her c6 radiculopathy. I advised her to see Dr. Vertell Limber at [facility] for surgical evaluation of both given her pain and discomfort. She agrees. She is a lovely patient. I also think she has acute/ongoing denervation in the cervical paraspinals however was unable to position patient or have her relax enough to eliminate motor units so I cannot definitively say but does appear there were positive waves in the cervical muscles again c/w radiculopathy.   A total of 15 minutes was spent on this patient's care, reviewing imaging, past records, recent hospitalization notes and results. Over half this time was spent on counseling patient on the  1. Bilateral carpal tunnel syndrome    diagnosis and different diagnostic and therapeutic options, counseling and coordination of care, risks and benefitsof management, compliance, or risk factor reduction and education.  This does not include time spent on emg/ncs.

## 2019-03-03 NOTE — Progress Notes (Signed)
See procedure note. Emg/ncs c/w bilateral severe CTS and right c6 radic

## 2019-03-04 NOTE — Procedures (Signed)
Full Name: Selena Barnes Gender: Female MRN #: [id number] Date of Birth: [birth year]    Visit Date: 03/03/2019 09:56 Age: 69 Years Examining Physician: Sarina Ill, MD  Referring Physician: Caryl Pina, MD  History: Patient with weakness and sensory changes in a right c6 distribution, depressed right triceps reflex, MRI cervical spine shows multilevel degenereative changes and broad disk bulge  At c5-c6. Also has signs and symptoms of bilateral Carpal Tunnel Syndrome. Here for EMG/NCS for further evaluation.  Summary:  Nerve Conduction Studies were performed on the bilateral upper extremities.  The right median APB motor nerve showed prolonged distal onset latency (5.8 ms, N<4.4) and reduced amplitude(2.78mV, N>4) The left  median APB motor nerve showed prolonged distal onset latency (7.4 ms, N<4.4) The right Median 2nd Digit orthodromic sensory nerve showed prolonged distal peak latency (3.8ms, N<3.4) and reduced amplitude(4 uV, N>10) The left  Median 2nd Digit orthodromic sensory nerve showed prolonged distal peak latency (4.1 ms, N<3.4) and reduced amplitude(4 uV, N>10)  The right median/ulnar (palm) comparison nerve showed prolonged distal peak latency (Median Palm, 3.01 ms, N<2.2) and abnormal peak latency difference (Median Palm-Ulnar Palm, 1.1 ms, N<0.4) with a relative median delay.    The left median/ulnar (palm) comparison nerve showed prolonged distal peak latency (Median Palm, 3.3 ms, N<2.2) and abnormal peak latency difference (Median Palm-Ulnar Palm, 1.5 ms, N<0.4) with a relative median delay.    All remaining nerves (as indicated in the following tables) were within normal limits.     The right pronator teres showed increased spontaneous activity, increased motor unit amplitude and diminished motor unit recruitment.  The left opponens pollicis showed increased spontaneous activity, increased motor unit amplitude, 1+ polyphasic motor units, diminished motor unit  recruitment.  The right opponens pollicis showed increased spontaneous activity and diminished motor unit recruitment. All remaining  muscles (as indicated in the following tables) were within normal limits.       Conclusion:  1. There is electrophysiologic evidence of bilateral severe Carpal Tunnel Syndrome.  No suggestion of polyneuropathy. 2.  There is acute/ongoing denervation and chronic neurogenic changes in a right C6-C7 innervated muscle which may be consistent with right cervical radiculopathy given supporting clinical and radiographic data.  Sarina Ill M.D.  [facility] [place], [place] [address] Tel: [phone] Fax: [phone]         [facility]    Nerve / Sites Muscle Latency Ref. Amplitude Ref. Rel Amp Segments Distance Velocity Ref. Area    ms ms mV mV %  cm m/s m/s mVms  L Median - APB     Wrist APB 7.4 ?4.4 4.2 ?4.0 100 Wrist - APB 7   15.2     Upper arm APB 11.4  4.0  95.7 Upper arm - Wrist 21 54 ?49 14.3  R Median - APB     Wrist APB 5.8 ?4.4 2.9 ?4.0 100 Wrist - APB 7   8.9     Upper arm APB 10.1  2.6  90.4 Upper arm - Wrist 21 49 ?49 8.9  L Ulnar - ADM     Wrist ADM 2.3 ?3.3 11.0 ?6.0 100 Wrist - ADM 7   28.3     B.Elbow ADM 5.3  9.3  84.6 B.Elbow - Wrist 18 61 ?49 28.3     A.Elbow ADM 7.1  9.7  104 A.Elbow - B.Elbow 10 54 ?49 28.1         A.Elbow - Wrist  R Ulnar - ADM     Wrist ADM 2.5 ?3.3 8.7 ?6.0 100 Wrist - ADM 7   21.5     B.Elbow ADM 5.5  7.7  88 B.Elbow - Wrist 18 60 ?49 21.4     A.Elbow ADM 7.1  8.5  111 A.Elbow - B.Elbow 10 60 ?49 23.0         A.Elbow - Wrist                 SNC    Nerve / Sites Rec. Site Peak Lat Ref.  Amp Ref. Segments Distance Peak Diff Ref.    ms ms V V  cm ms ms  L Median, Ulnar - Transcarpal comparison     Median Palm Wrist 3.3 ?2.2 30 ?35 Median Palm - Wrist 8       Ulnar Palm Wrist 1.8 ?2.2 19 ?12 Ulnar Palm - Wrist 8          Median Palm - Ulnar Palm  1.5 ?0.4  R Median, Ulnar -  Transcarpal comparison     Median Palm Wrist 3.0 ?2.2 40 ?35 Median Palm - Wrist 8       Ulnar Palm Wrist 1.9 ?2.2 18 ?12 Ulnar Palm - Wrist 8          Median Palm - Ulnar Palm  1.1 ?0.4  L Median - Orthodromic (Dig II, Mid palm)     Dig II Wrist 4.1 ?3.4 4 ?10 Dig II - Wrist 13    R Median - Orthodromic (Dig II, Mid palm)     Dig II Wrist 3.9 ?3.4 4 ?10 Dig II - Wrist 13    L Ulnar - Orthodromic, (Dig V, Mid palm)     Dig V Wrist 2.7 ?3.1 5 ?5 Dig V - Wrist 11    R Ulnar - Orthodromic, (Dig V, Mid palm)     Dig V Wrist 2.6 ?3.1 5 ?5 Dig V - Wrist 50                   F  Wave    Nerve F Lat Ref.   ms ms  L Ulnar - ADM 26.9 ?32.0  R Ulnar - ADM 26.0 ?32.0         EMG Summary Table    Spontaneous MUAP Recruitment  Muscle IA Fib PSW Fasc Other Amp Dur. Poly Pattern  L. Deltoid Normal None None None _______ Normal Normal Normal Normal  R. Deltoid Normal None None None _______ Normal Normal Normal Normal  L. Triceps brachii Normal None None None _______ Normal Normal Normal Normal  R. Triceps brachii Normal None None None _______ Normal Normal Normal Normal  L. Pronator teres Normal None None None _______ Normal Normal Normal Normal  R. Pronator teres Normal None 2+ None _______ Increased Normal Normal Reduced  L. Biceps brachii Normal None None None _______ Normal Normal Normal Normal  R. Biceps brachii Normal None None None _______ Normal Normal Normal Normal  L. First dorsal interosseous Normal None None None _______ Normal Normal Normal Normal  R. First dorsal interosseous Normal None None None _______ Normal Normal Normal Normal  L. Opponens pollicis Normal None 3+ None _______ Increased Normal 1+ Reduced  R. Opponens pollicis Normal None 3+ None _______ Normal Normal Normal Reduced  L. Cervical paraspinals (low) Normal None None None _______ Normal Normal Normal Normal  R. Cervical paraspinals (low) Normal None None None _______ Normal Normal Normal Normal

## 2019-03-20 DIAGNOSIS — Z23 Encounter for immunization: Secondary | ICD-10-CM | POA: Diagnosis not present

## 2019-04-07 DIAGNOSIS — M50223 Other cervical disc displacement at C6-C7 level: Secondary | ICD-10-CM | POA: Diagnosis not present

## 2019-04-07 DIAGNOSIS — M5412 Radiculopathy, cervical region: Secondary | ICD-10-CM | POA: Diagnosis not present

## 2019-04-07 DIAGNOSIS — Z6829 Body mass index (BMI) 29.0-29.9, adult: Secondary | ICD-10-CM | POA: Diagnosis not present

## 2019-04-07 DIAGNOSIS — M542 Cervicalgia: Secondary | ICD-10-CM | POA: Diagnosis not present

## 2019-04-07 DIAGNOSIS — M5020 Other cervical disc displacement, unspecified cervical region: Secondary | ICD-10-CM | POA: Insufficient documentation

## 2019-04-07 DIAGNOSIS — R030 Elevated blood-pressure reading, without diagnosis of hypertension: Secondary | ICD-10-CM | POA: Diagnosis not present

## 2019-04-07 DIAGNOSIS — G5603 Carpal tunnel syndrome, bilateral upper limbs: Secondary | ICD-10-CM | POA: Diagnosis not present

## 2019-04-07 DIAGNOSIS — M50222 Other cervical disc displacement at C5-C6 level: Secondary | ICD-10-CM | POA: Diagnosis not present

## 2019-04-15 DIAGNOSIS — M4802 Spinal stenosis, cervical region: Secondary | ICD-10-CM | POA: Diagnosis not present

## 2019-04-15 DIAGNOSIS — M50221 Other cervical disc displacement at C4-C5 level: Secondary | ICD-10-CM | POA: Diagnosis not present

## 2019-04-15 DIAGNOSIS — M50223 Other cervical disc displacement at C6-C7 level: Secondary | ICD-10-CM | POA: Diagnosis not present

## 2019-04-15 DIAGNOSIS — M50222 Other cervical disc displacement at C5-C6 level: Secondary | ICD-10-CM | POA: Diagnosis not present

## 2019-04-15 DIAGNOSIS — M5412 Radiculopathy, cervical region: Secondary | ICD-10-CM | POA: Diagnosis not present

## 2019-04-16 DIAGNOSIS — R030 Elevated blood-pressure reading, without diagnosis of hypertension: Secondary | ICD-10-CM | POA: Diagnosis not present

## 2019-04-16 DIAGNOSIS — Z6829 Body mass index (BMI) 29.0-29.9, adult: Secondary | ICD-10-CM | POA: Diagnosis not present

## 2019-04-16 DIAGNOSIS — G5603 Carpal tunnel syndrome, bilateral upper limbs: Secondary | ICD-10-CM | POA: Diagnosis not present

## 2019-04-18 DIAGNOSIS — Z23 Encounter for immunization: Secondary | ICD-10-CM | POA: Diagnosis not present

## 2019-05-09 ENCOUNTER — Telehealth: Payer: Self-pay | Admitting: Family Medicine

## 2019-05-09 NOTE — Chronic Care Management (AMB) (Signed)
  Chronic Care Management   Outreach Note  05/09/2019 Name: YATANA PIPE MRN: [id number] DOB: 13-Oct-1950  JAIMY OGUIN is a 69 y.o. year old female who is a primary care patient of Dettinger, Fransisca Kaufmann, MD. I reached out to [employer] by phone today in response to a referral sent by Ms. Doneta A Vane's health plan.     An unsuccessful telephone outreach was attempted today. The patient was referred to the case management team for assistance with care management and care coordination.   Follow Up Plan: A HIPPA compliant phone message was left for the patient providing contact information and requesting a return call. The care management team will reach out to the patient again over the next 7 days. If patient returns call to provider office, please advise to call [place] at [phone].  [place], [place] [address] Direct Dial: [phone] [email or web address]: [email or web address]

## 2019-05-12 NOTE — Chronic Care Management (AMB) (Signed)
  Chronic Care Management   Note  05/12/2019 Name: Selena Barnes MRN: [id number] DOB: [birth year]  Selena Barnes is a 69 y.o. year old female who is a primary care patient of Dettinger, Fransisca Kaufmann, MD. I reached out to [employer] by phone today in response to a referral sent by Selena Barnes's health plan.     [name] was given information about Chronic Care Management services today including:  1. CCM service includes personalized support from designated clinical staff supervised by her physician, including individualized plan of care and coordination with other care providers 2. 24/7 contact phone numbers for assistance for urgent and routine care needs. 3. Service will only be billed when office clinical staff spend 20 minutes or more in a month to coordinate care. 4. Only one practitioner may furnish and bill the service in a calendar month. 5. The patient may stop CCM services at any time (effective at the end of the month) by phone call to the office staff. 6. The patient will be responsible for cost sharing (co-pay) of up to 20% of the service fee (after annual deductible is met).  Patient agreed to services and verbal consent obtained.   Follow up plan: Telephone appointment with care management team member scheduled for: 12/24/2019.  [place], [place] [address] Direct Dial: [phone] [email or web address]: [email or web address]

## 2019-05-13 DIAGNOSIS — G5603 Carpal tunnel syndrome, bilateral upper limbs: Secondary | ICD-10-CM | POA: Diagnosis not present

## 2019-05-13 DIAGNOSIS — G5601 Carpal tunnel syndrome, right upper limb: Secondary | ICD-10-CM | POA: Diagnosis not present

## 2019-05-26 DIAGNOSIS — R030 Elevated blood-pressure reading, without diagnosis of hypertension: Secondary | ICD-10-CM | POA: Insufficient documentation

## 2019-06-24 DIAGNOSIS — G5603 Carpal tunnel syndrome, bilateral upper limbs: Secondary | ICD-10-CM | POA: Diagnosis not present

## 2019-06-24 DIAGNOSIS — G5602 Carpal tunnel syndrome, left upper limb: Secondary | ICD-10-CM | POA: Diagnosis not present

## 2019-07-07 DIAGNOSIS — M25572 Pain in left ankle and joints of left foot: Secondary | ICD-10-CM | POA: Diagnosis not present

## 2019-07-07 DIAGNOSIS — M79672 Pain in left foot: Secondary | ICD-10-CM | POA: Diagnosis not present

## 2019-07-08 ENCOUNTER — Other Ambulatory Visit: Payer: Self-pay | Admitting: Family Medicine

## 2019-07-08 DIAGNOSIS — Z1231 Encounter for screening mammogram for malignant neoplasm of breast: Secondary | ICD-10-CM

## 2019-08-20 DIAGNOSIS — H2513 Age-related nuclear cataract, bilateral: Secondary | ICD-10-CM | POA: Diagnosis not present

## 2019-08-20 DIAGNOSIS — H40033 Anatomical narrow angle, bilateral: Secondary | ICD-10-CM | POA: Diagnosis not present

## 2019-08-21 ENCOUNTER — Ambulatory Visit
Admission: RE | Admit: 2019-08-21 | Discharge: 2019-08-21 | Disposition: A | Discharge status: Home / Self Care | Payer: Medicare Other | Source: Ambulatory Visit | Attending: Family Medicine | Admitting: Family Medicine

## 2019-08-21 ENCOUNTER — Other Ambulatory Visit: Payer: Self-pay

## 2019-08-21 DIAGNOSIS — Z1231 Encounter for screening mammogram for malignant neoplasm of breast: Secondary | ICD-10-CM

## 2019-08-22 DIAGNOSIS — M25539 Pain in unspecified wrist: Secondary | ICD-10-CM | POA: Insufficient documentation

## 2019-09-16 ENCOUNTER — Ambulatory Visit: Payer: Medicare Other | Attending: Neurosurgery | Admitting: Physical Therapy

## 2019-09-16 ENCOUNTER — Other Ambulatory Visit: Payer: Self-pay

## 2019-09-16 DIAGNOSIS — M25532 Pain in left wrist: Secondary | ICD-10-CM | POA: Diagnosis not present

## 2019-09-16 DIAGNOSIS — M25531 Pain in right wrist: Secondary | ICD-10-CM | POA: Insufficient documentation

## 2019-09-16 NOTE — Therapy (Signed)
[facility] [place], [name], [address] Phone: [phone]   Fax:  [phone]  Physical Therapy Evaluation  Patient Details  Name: Selena Barnes MRN: [id number] Date of Birth: 09-20-1950 Referring Provider (PT): Erline Levine MD   Encounter Date: 09/16/2019   PT End of Session - 09/16/19 1340    Visit Number 1    Number of Visits 12    Date for PT Re-Evaluation 10/28/19    PT Start Time 0106    PT Stop Time 0157    PT Time Calculation (min) 51 min    Activity Tolerance Patient tolerated treatment well    Behavior During Therapy [facility] for tasks assessed/performed           Past Medical History:  Diagnosis Date  . Arthritis   . Constipation   . Depression   . GERD (gastroesophageal reflux disease)   . Hyperlipidemia   . Insomnia   . Migraines   . Osteopenia     Past Surgical History:  Procedure Laterality Date  . ABDOMINAL HYSTERECTOMY    . ANAL RECTAL MANOMETRY N/A 10/06/2015   Procedure: ANO RECTAL MANOMETRY;  Surgeon: Gatha Mayer, MD;  Location: [facility];  Service: Endoscopy;  Laterality: N/A;  . BREAST BIOPSY Right   . BREAST EXCISIONAL BIOPSY Right    benign  . BREAST EXCISIONAL BIOPSY Left 2000   axillary area. lymph node biopsy- benign  . CATARACT EXTRACTION W/ INTRAOCULAR LENS IMPLANT Bilateral 07/27/2016   and 09/27/2016, [employer] in [place]  . CATARACT EXTRACTION, BILATERAL    . CHOLECYSTECTOMY    . knee surgery Left 2019   arthroscopic for meniscus tear  . LYMPH NODE BIOPSY Left    left arm    There were no vitals filed for this visit.    Subjective Assessment - 09/16/19 1340    Subjective COVID-19 screen performed prior to patient entering clinic.  The patient underwent a right CTR surgery at the end of March of this year and on 06/25/19 for her left.  The patient reports continued bilateral wrist pain rated at 6/10 though she reports her bilateral hand numbness is  decreasing.  She also reports bilateral thumb pain with "turning" her wrist.  She reports she drops objects as well.    Pertinent History Arthritis, GERD, migraines, left knee surgery, cholecystectomy.    Patient Stated Goals Use hands without pain.    Currently in Pain? Yes    Pain Score 6     Pain Location Wrist    Pain Orientation Right;Left    Pain Descriptors / Indicators Aching;Sore;Sharp;Shooting    Pain Type Surgical pain    Pain Onset More than a month ago    Pain Frequency Constant    Aggravating Factors  Movement.    Pain Relieving Factors Hasn't found anything yet.              [facility] PT Assessment - 09/16/19 0001      Assessment   Medical Diagnosis Bilateral wrist pain.    Referring Provider (PT) Erline Levine MD    Onset Date/Surgical Date --   Ongoing.     Precautions   Precautions None      Restrictions   Weight Bearing Restrictions --   NO bilateral UE weight bearing.     Balance Screen   Has the patient fallen in the past 6 months No    Has the patient had a decrease in activity level because  of a fear of falling?  No    Is the patient reluctant to leave their home because of a fear of falling?  No      Home Environment   Living Environment Private residence      Prior Function   Level of Independence Independent      Observation/Other Assessments   Observations Bilateral wrist incisional sites are well healed with generally good mobility.      ROM / Strength   AROM / PROM / Strength AROM;Strength      AROM   Overall AROM Comments Active left wrist extension= 30 degrees and right= 43 degrees.  Near full bilateral supination with patient c/o pain in bilateral CMC joints with this movement.      Strength   Overall Strength Comments Right grip= 5#; left unable to register.      Palpation   Palpation comment Some tender over wrist incisional sites and bilateral CMC joints.                      Objective measurements completed on  examination: See above findings.       [facility] Adult PT Treatment/Exercise - 09/16/19 0001      Modalities   Modalities Electrical Stimulation      Electrical Stimulation   Electrical Stimulation Location Bilateral wrist palmar side of wrists and CMC joints.    Electrical Stimulation Action Pre-mod.    Electrical Stimulation Parameters 80-150 Hz x 20 minutes.    Electrical Stimulation Goals Pain                       PT Long Term Goals - 09/16/19 1418      PT LONG TERM GOAL #1   Title Independent with HEP    Time 6    Status New      PT LONG TERM GOAL #2   Title Bilateral active wrist extension to 55 degrees.    Time 6    Period Weeks    Status New      PT LONG TERM GOAL #3   Title Bilateral grip strength= 20#.    Time 6    Period Weeks    Status New      PT LONG TERM GOAL #4   Title Perform ADL's with bilateral wrist pain not > 3/10.    Time 6    Period Weeks    Status New                  Plan - 09/16/19 1406    Clinical Impression Statement The patient presents to OPPT with c/o bilateral wrist pain.  She underrwent CTR on the right in March and on the left on 06/25/19.  She has continued pain but her hamd numbness is decreasing.  She has some loss of bilateral wrist extension and strength losses.  She has some tenderness over bilateral incisions and her CMCJ's.  Patient will benefit from skilled physical therapy intervention to address deficits and pain.    Personal Factors and Comorbidities Comorbidity 1;Comorbidity 2    Comorbidities Arthritis, GERD, migraines, left knee surgery, cholecystectomy.    Examination-Activity Limitations Carry;Other    Examination-Participation Restrictions Other    Stability/Clinical Decision Making Stable/Uncomplicated    Clinical Decision Making Low    Rehab Potential Good    PT Duration 6 weeks    PT Treatment/Interventions ADLs/Self Care Home Management;Cryotherapy;Electrical Stimulation;Ultrasound;Moist  Heat;Iontophoresis 4mg /ml Dexamethasone;Therapeutic activities;Manual techniques;Patient/family education;Scar  mobilization;Passive range of motion    PT Next Visit Plan Pulsed [place]; STW/M/scar massage/mobility; bilateral wrist extension stretching.  Progress per CTR protocol.    Consulted and Agree with Plan of Care Patient           Patient will benefit from skilled therapeutic intervention in order to improve the following deficits and impairments:  Pain, Decreased range of motion, Decreased strength, Decreased activity tolerance  Visit Diagnosis: Pain in left wrist - Plan: PT plan of care cert/re-cert  Pain in right wrist - Plan: PT plan of care cert/re-cert     Problem List Patient Active Problem List   Diagnosis Date Noted  . Bilateral carpal tunnel syndrome 03/03/2019  . C6 radiculopathy 03/03/2019  . Acute medial meniscal tear, left, initial encounter 12/07/2017  . Other cervical disc degeneration, unspecified cervical region 11/07/2017  . Constipation due to outlet dysfunction   . Osteopenia with high risk of fracture 02/05/2015  . BMI 26.0-26.9,adult 05/21/2014  . Heel spur 05/21/2014  . Migraines 12/20/2012  . Insomnia 12/20/2012  . Depression 12/20/2012  . GERD (gastroesophageal reflux disease) 12/20/2012  . Hyperlipidemia with target LDL less than 100 12/20/2012    Dionne Rossa, [place] MPT 09/16/2019, 2:27 PM  [facility] [place], [name], [address] Phone: [phone]   Fax:  [phone]  Name: Selena Barnes MRN: [id number] Date of Birth: [birth year]

## 2019-09-18 ENCOUNTER — Other Ambulatory Visit: Payer: Self-pay

## 2019-09-18 ENCOUNTER — Ambulatory Visit: Payer: Medicare Other | Admitting: Physical Therapy

## 2019-09-18 DIAGNOSIS — M25532 Pain in left wrist: Secondary | ICD-10-CM

## 2019-09-18 DIAGNOSIS — M25531 Pain in right wrist: Secondary | ICD-10-CM

## 2019-09-18 NOTE — Therapy (Signed)
[facility] [place], [name], [address] Phone: [phone]   Fax:  [phone]  Physical Therapy Treatment  Patient Details  Name: Selena Barnes MRN: [id number] Date of Birth: 1950/03/07 Referring Provider (PT): Erline Levine MD   Encounter Date: 09/18/2019   PT End of Session - 09/18/19 1036    Visit Number 2    Number of Visits 12    Date for PT Re-Evaluation 10/28/19    PT Start Time 6962   Late for treatment.   PT Stop Time 1030    PT Time Calculation (min) 35 min    Activity Tolerance Patient tolerated treatment well    Behavior During Therapy WFL for tasks assessed/performed           Past Medical History:  Diagnosis Date  . Arthritis   . Constipation   . Depression   . GERD (gastroesophageal reflux disease)   . Hyperlipidemia   . Insomnia   . Migraines   . Osteopenia     Past Surgical History:  Procedure Laterality Date  . ABDOMINAL HYSTERECTOMY    . ANAL RECTAL MANOMETRY N/A 10/06/2015   Procedure: ANO RECTAL MANOMETRY;  Surgeon: Gatha Mayer, MD;  Location: [facility];  Service: Endoscopy;  Laterality: N/A;  . BREAST BIOPSY Right   . BREAST EXCISIONAL BIOPSY Right    benign  . BREAST EXCISIONAL BIOPSY Left 2000   axillary area. lymph node biopsy- benign  . CATARACT EXTRACTION W/ INTRAOCULAR LENS IMPLANT Bilateral 07/27/2016   and 09/27/2016, [employer] in [place]  . CATARACT EXTRACTION, BILATERAL    . CHOLECYSTECTOMY    . knee surgery Left 2019   arthroscopic for meniscus tear  . LYMPH NODE BIOPSY Left    left arm    There were no vitals filed for this visit.   Subjective Assessment - 09/18/19 1035    Subjective COVID-19 screen performed prior to patient entering clinic.  No new complaints.    Pertinent History Arthritis, GERD, migraines, left knee surgery, cholecystectomy.    Patient Stated Goals Use hands without pain.    Currently in Pain? Yes    Pain Score 6      Pain Orientation Right;Left    Pain Descriptors / Indicators Aching;Sore;Sharp;Shooting    Pain Type Surgical pain    Pain Onset More than a month ago                             [facility] Adult PT Treatment/Exercise - 09/18/19 0001      Modalities   Modalities Electrical Stimulation;Ultrasound      Electrical Stimulation   Electrical Stimulation Location Bilateral wrist.    Electrical Stimulation Action Pre-mod.    Electrical Stimulation Parameters 80-150 Hz x 15 minutes.    Electrical Stimulation Goals Pain      Ultrasound   Ultrasound Location Bil wrist (carpal tunnel area).    Ultrasound Parameters U/S at 1.50 W/CM2 at 20% 3.3 mHZ 4 minutes each side (8 minutes total).      Manual Therapy   Manual Therapy Soft tissue mobilization    Soft tissue mobilization STW/M x 4 minutes each wrist, 8 minutes total, carpal tunnel area.                       PT Long Term Goals - 09/16/19 1418      PT LONG TERM GOAL #1  Title Independent with HEP    Time 6    Status New      PT LONG TERM GOAL #2   Title Bilateral active wrist extension to 55 degrees.    Time 6    Period Weeks    Status New      PT LONG TERM GOAL #3   Title Bilateral grip strength= 20#.    Time 6    Period Weeks    Status New      PT LONG TERM GOAL #4   Title Perform ADL's with bilateral wrist pain not > 3/10.    Time 6    Period Weeks    Status New                 Plan - 09/18/19 1108    Clinical Impression Statement Patient did well with treatment well.  Continued bilateral wrist and thumb pain reported.    Personal Factors and Comorbidities Comorbidity 1;Comorbidity 2    Comorbidities Arthritis, GERD, migraines, left knee surgery, cholecystectomy.    Examination-Activity Limitations Carry;Other    Examination-Participation Restrictions Other    Stability/Clinical Decision Making Stable/Uncomplicated    Rehab Potential Good    PT Duration 6 weeks    PT  Treatment/Interventions ADLs/Self Care Home Management;Cryotherapy;Electrical Stimulation;Ultrasound;Moist Heat;Iontophoresis 4mg /ml Dexamethasone;Therapeutic activities;Manual techniques;Patient/family education;Scar mobilization;Passive range of motion    PT Next Visit Plan Pulsed [place]; STW/M/scar massage/mobility; bilateral wrist extension stretching.  Progress per CTR protocol.    Consulted and Agree with Plan of Care Patient           Patient will benefit from skilled therapeutic intervention in order to improve the following deficits and impairments:  Pain, Decreased range of motion, Decreased strength, Decreased activity tolerance  Visit Diagnosis: Pain in left wrist  Pain in right wrist     Problem List Patient Active Problem List   Diagnosis Date Noted  . Bilateral carpal tunnel syndrome 03/03/2019  . C6 radiculopathy 03/03/2019  . Acute medial meniscal tear, left, initial encounter 12/07/2017  . Other cervical disc degeneration, unspecified cervical region 11/07/2017  . Constipation due to outlet dysfunction   . Osteopenia with high risk of fracture 02/05/2015  . BMI 26.0-26.9,adult 05/21/2014  . Heel spur 05/21/2014  . Migraines 12/20/2012  . Insomnia 12/20/2012  . Depression 12/20/2012  . GERD (gastroesophageal reflux disease) 12/20/2012  . Hyperlipidemia with target LDL less than 100 12/20/2012    Versie Fleener, [place] MPT 09/18/2019, 11:12 AM  [facility] [place], [name], [address] Phone: [phone]   Fax:  [phone]  Name: Selena Barnes MRN: [id number] Date of Birth: 11/24/1950

## 2019-09-23 ENCOUNTER — Other Ambulatory Visit: Payer: Self-pay

## 2019-09-23 ENCOUNTER — Ambulatory Visit: Payer: Medicare Other | Admitting: *Deleted

## 2019-09-23 DIAGNOSIS — M25532 Pain in left wrist: Secondary | ICD-10-CM

## 2019-09-23 DIAGNOSIS — M25531 Pain in right wrist: Secondary | ICD-10-CM

## 2019-09-23 NOTE — Therapy (Signed)
[facility] [place], [name], [address] Phone: [phone]   Fax:  [phone]  Physical Therapy Treatment  Patient Details  Name: Selena Barnes MRN: [id number] Date of Birth: 1950-06-16 Referring Provider (PT): Erline Levine MD   Encounter Date: 09/23/2019   PT End of Session - 09/23/19 1719    Visit Number 3    Number of Visits 12    Date for PT Re-Evaluation 10/28/19    PT Start Time 0945    PT Stop Time 1036    PT Time Calculation (min) 51 min           Past Medical History:  Diagnosis Date  . Arthritis   . Constipation   . Depression   . GERD (gastroesophageal reflux disease)   . Hyperlipidemia   . Insomnia   . Migraines   . Osteopenia     Past Surgical History:  Procedure Laterality Date  . ABDOMINAL HYSTERECTOMY    . ANAL RECTAL MANOMETRY N/A 10/06/2015   Procedure: ANO RECTAL MANOMETRY;  Surgeon: Gatha Mayer, MD;  Location: [facility];  Service: Endoscopy;  Laterality: N/A;  . BREAST BIOPSY Right   . BREAST EXCISIONAL BIOPSY Right    benign  . BREAST EXCISIONAL BIOPSY Left 2000   axillary area. lymph node biopsy- benign  . CATARACT EXTRACTION W/ INTRAOCULAR LENS IMPLANT Bilateral 07/27/2016   and 09/27/2016, [employer] in [place]  . CATARACT EXTRACTION, BILATERAL    . CHOLECYSTECTOMY    . knee surgery Left 2019   arthroscopic for meniscus tear  . LYMPH NODE BIOPSY Left    left arm    There were no vitals filed for this visit.   Subjective Assessment - 09/23/19 1732    Subjective COVID-19 screen performed prior to patient entering clinic.  Did good after last Rx.    Pertinent History Arthritis, GERD, migraines, left knee surgery, cholecystectomy.    Patient Stated Goals Use hands without pain.    Currently in Pain? Yes    Pain Score 6                              [facility] Adult PT Treatment/Exercise - 09/23/19 0001      Modalities   Modalities  Electrical Stimulation;Ultrasound      Acupuncturist Stimulation Location Bilateral wrist.    Printmaker Action premod 2 chs    Electrical Stimulation Parameters 80-150hz  x 15 mins    Electrical Stimulation Goals Pain      Ultrasound   Ultrasound Location Bil. wrist (carpal    Ultrasound Parameters [place] at .5 w/cm2 x 12 mins ( 6 mins each)    Ultrasound Goals Pain      Manual Therapy   Manual Therapy Soft tissue mobilization    Soft tissue mobilization STW/M x6 minutes each wrist, 12 minutes total, carpal tunnel area.                       PT Long Term Goals - 09/16/19 1418      PT LONG TERM GOAL #1   Title Independent with HEP    Time 6    Status New      PT LONG TERM GOAL #2   Title Bilateral active wrist extension to 55 degrees.    Time 6    Period Weeks    Status New  PT LONG TERM GOAL #3   Title Bilateral grip strength= 20#.    Time 6    Period Weeks    Status New      PT LONG TERM GOAL #4   Title Perform ADL's with bilateral wrist pain not > 3/10.    Time 6    Period Weeks    Status New                 Plan - 09/23/19 1720    Clinical Impression Statement Pt arrived today doing about the same with pain in both wrists and thumbs. Pulsed [place] as well as STW performed to both wrists and palms. Bilateral wrist extension stretching performed f/b estim. Normal modality response today.    Comorbidities Arthritis, GERD, migraines, left knee surgery, cholecystectomy.    Examination-Activity Limitations Carry;Other    Examination-Participation Restrictions Other    Stability/Clinical Decision Making Stable/Uncomplicated    Rehab Potential Good    PT Duration 6 weeks    PT Treatment/Interventions ADLs/Self Care Home Management;Cryotherapy;Electrical Stimulation;Ultrasound;Moist Heat;Iontophoresis 4mg /ml Dexamethasone;Therapeutic activities;Manual techniques;Patient/family education;Scar mobilization;Passive range  of motion    PT Next Visit Plan Pulsed [place]; STW/M/scar massage/mobility; bilateral wrist extension stretching.  Progress per CTR protocol.    Consulted and Agree with Plan of Care Patient           Patient will benefit from skilled therapeutic intervention in order to improve the following deficits and impairments:  Pain, Decreased range of motion, Decreased strength, Decreased activity tolerance  Visit Diagnosis: Pain in left wrist  Pain in right wrist     Problem List Patient Active Problem List   Diagnosis Date Noted  . Bilateral carpal tunnel syndrome 03/03/2019  . C6 radiculopathy 03/03/2019  . Acute medial meniscal tear, left, initial encounter 12/07/2017  . Other cervical disc degeneration, unspecified cervical region 11/07/2017  . Constipation due to outlet dysfunction   . Osteopenia with high risk of fracture 02/05/2015  . BMI 26.0-26.9,adult 05/21/2014  . Heel spur 05/21/2014  . Migraines 12/20/2012  . Insomnia 12/20/2012  . Depression 12/20/2012  . GERD (gastroesophageal reflux disease) 12/20/2012  . Hyperlipidemia with target LDL less than 100 12/20/2012    Margarita Bobrowski,CHRIS , PTA 09/23/2019, 5:56 PM  [facility] [place], [name], [address] Phone: [phone]   Fax:  [phone]  Name: Selena Barnes MRN: [id number] Date of Birth: May 26, 1950

## 2019-09-25 ENCOUNTER — Ambulatory Visit: Payer: Medicare Other | Admitting: *Deleted

## 2019-09-25 ENCOUNTER — Other Ambulatory Visit: Payer: Self-pay

## 2019-09-25 DIAGNOSIS — M25532 Pain in left wrist: Secondary | ICD-10-CM

## 2019-09-25 DIAGNOSIS — M25531 Pain in right wrist: Secondary | ICD-10-CM | POA: Diagnosis not present

## 2019-09-25 NOTE — Therapy (Signed)
[facility] [place], [name], [address] Phone: [phone]   Fax:  [phone]  Physical Therapy Treatment  Patient Details  Name: Selena Barnes MRN: [id number] Date of Birth: 1950/06/23 Referring Provider (PT): Erline Levine MD   Encounter Date: 09/25/2019   PT End of Session - 09/25/19 1303    Visit Number 4    Number of Visits 12    Date for PT Re-Evaluation 10/28/19    PT Start Time 0945    PT Stop Time 1036    PT Time Calculation (min) 51 min           Past Medical History:  Diagnosis Date  . Arthritis   . Constipation   . Depression   . GERD (gastroesophageal reflux disease)   . Hyperlipidemia   . Insomnia   . Migraines   . Osteopenia     Past Surgical History:  Procedure Laterality Date  . ABDOMINAL HYSTERECTOMY    . ANAL RECTAL MANOMETRY N/A 10/06/2015   Procedure: ANO RECTAL MANOMETRY;  Surgeon: Gatha Mayer, MD;  Location: [facility];  Service: Endoscopy;  Laterality: N/A;  . BREAST BIOPSY Right   . BREAST EXCISIONAL BIOPSY Right    benign  . BREAST EXCISIONAL BIOPSY Left 2000   axillary area. lymph node biopsy- benign  . CATARACT EXTRACTION W/ INTRAOCULAR LENS IMPLANT Bilateral 07/27/2016   and 09/27/2016, [employer] in [place]  . CATARACT EXTRACTION, BILATERAL    . CHOLECYSTECTOMY    . knee surgery Left 2019   arthroscopic for meniscus tear  . LYMPH NODE BIOPSY Left    left arm    There were no vitals filed for this visit.   Subjective Assessment - 09/25/19 0953    Subjective COVID-19 screen performed prior to patient entering clinic.  Did good after last Rx. Hands are sore 3/10. Mainly the thumbs    Pertinent History Arthritis, GERD, migraines, left knee surgery, cholecystectomy.    Patient Stated Goals Use hands without pain.    Pain Score 3     Pain Location Wrist    Pain Orientation Right;Left    Pain Descriptors / Indicators Aching;Sore;Sharp    Pain Type  Surgical pain    Pain Onset More than a month ago                             [facility] Adult PT Treatment/Exercise - 09/25/19 0001      Exercises   Exercises Wrist;Hand      Hand Exercises   Other Hand Exercises yellow putty hand gripping and finger opposition x 2 mins each hand      Modalities   Modalities Electrical Stimulation;Ultrasound      Electrical Stimulation   Electrical Stimulation Location Bilateral wrist. and thumb    Electrical Stimulation Action premod x 2 chs    Electrical Stimulation Parameters 80-150hz  x 15 mins    Electrical Stimulation Goals Pain      Ultrasound   Ultrasound Location Bil wrist carpal area and thumb area    Ultrasound Parameters 3.3 MHZ, .5 w/cm2 x 7 mins each side    Ultrasound Goals Pain      Manual Therapy   Manual Therapy Soft tissue mobilization    Soft tissue mobilization STW/M x 5  minutes each wrist, 10 minutes total, carpal tunnel area. and thumb  PT Long Term Goals - 09/16/19 1418      PT LONG TERM GOAL #1   Title Independent with HEP    Time 6    Status New      PT LONG TERM GOAL #2   Title Bilateral active wrist extension to 55 degrees.    Time 6    Period Weeks    Status New      PT LONG TERM GOAL #3   Title Bilateral grip strength= 20#.    Time 6    Period Weeks    Status New      PT LONG TERM GOAL #4   Title Perform ADL's with bilateral wrist pain not > 3/10.    Time 6    Period Weeks    Status New                 Plan - 09/25/19 1304    Clinical Impression Statement Pt arrived toda doing ok with numbness in hands and pain in her thumbs. She was given yellow putty for hand gripping and opposition.[place] and STW were performed to wrist and palm as well as STW to Bil pollicis longus/brevis. Normal modality response.    Personal Factors and Comorbidities Comorbidity 1;Comorbidity 2    Comorbidities Arthritis, GERD, migraines, left knee surgery,  cholecystectomy.    Examination-Activity Limitations Carry;Other    Examination-Participation Restrictions Other    Stability/Clinical Decision Making Stable/Uncomplicated    Rehab Potential Good    PT Duration 6 weeks    PT Treatment/Interventions ADLs/Self Care Home Management;Cryotherapy;Electrical Stimulation;Ultrasound;Moist Heat;Iontophoresis 4mg /ml Dexamethasone;Therapeutic activities;Manual techniques;Patient/family education;Scar mobilization;Passive range of motion    PT Next Visit Plan Pulsed [place]; STW/M/scar massage/mobility; bilateral wrist extension stretching.  Progress per CTR protocol.    Consulted and Agree with Plan of Care Patient           Patient will benefit from skilled therapeutic intervention in order to improve the following deficits and impairments:  Pain, Decreased range of motion, Decreased strength, Decreased activity tolerance  Visit Diagnosis: Pain in left wrist  Pain in right wrist     Problem List Patient Active Problem List   Diagnosis Date Noted  . Bilateral carpal tunnel syndrome 03/03/2019  . C6 radiculopathy 03/03/2019  . Acute medial meniscal tear, left, initial encounter 12/07/2017  . Other cervical disc degeneration, unspecified cervical region 11/07/2017  . Constipation due to outlet dysfunction   . Osteopenia with high risk of fracture 02/05/2015  . BMI 26.0-26.9,adult 05/21/2014  . Heel spur 05/21/2014  . Migraines 12/20/2012  . Insomnia 12/20/2012  . Depression 12/20/2012  . GERD (gastroesophageal reflux disease) 12/20/2012  . Hyperlipidemia with target LDL less than 100 12/20/2012    Natilee Gauer,CHRIS, PTA 09/25/2019, 1:17 PM  [facility] [place], [name], [address] Phone: [phone]   Fax:  [phone]  Name: [name] MRN: [id number] Date of Birth: [birth year]

## 2019-09-30 ENCOUNTER — Encounter: Payer: Self-pay | Admitting: Physical Therapy

## 2019-09-30 ENCOUNTER — Ambulatory Visit: Payer: Medicare Other | Admitting: Physical Therapy

## 2019-09-30 ENCOUNTER — Other Ambulatory Visit: Payer: Self-pay

## 2019-09-30 DIAGNOSIS — M25532 Pain in left wrist: Secondary | ICD-10-CM | POA: Diagnosis not present

## 2019-09-30 DIAGNOSIS — M25531 Pain in right wrist: Secondary | ICD-10-CM | POA: Diagnosis not present

## 2019-09-30 NOTE — Therapy (Signed)
[facility] [place], [name], [address] Phone: [phone]   Fax:  [phone]  Physical Therapy Treatment  Patient Details  Name: Selena Barnes MRN: [id number] Date of Birth: 05-Feb-1951 Referring Provider (PT): Erline Levine MD   Encounter Date: 09/30/2019   PT End of Session - 09/30/19 0813    Visit Number 5    Number of Visits 12    Date for PT Re-Evaluation 10/28/19    PT Start Time 0733    PT Stop Time 0818    PT Time Calculation (min) 45 min    Activity Tolerance Patient tolerated treatment well    Behavior During Therapy [facility] for tasks assessed/performed           Past Medical History:  Diagnosis Date   Arthritis    Constipation    Depression    GERD (gastroesophageal reflux disease)    Hyperlipidemia    Insomnia    Migraines    Osteopenia     Past Surgical History:  Procedure Laterality Date   ABDOMINAL HYSTERECTOMY     ANAL RECTAL MANOMETRY N/A 10/06/2015   Procedure: ANO RECTAL MANOMETRY;  Surgeon: Gatha Mayer, MD;  Location: [facility];  Service: Endoscopy;  Laterality: N/A;   BREAST BIOPSY Right    BREAST EXCISIONAL BIOPSY Right    benign   BREAST EXCISIONAL BIOPSY Left 2000   axillary area. lymph node biopsy- benign   CATARACT EXTRACTION W/ INTRAOCULAR LENS IMPLANT Bilateral 07/27/2016   and 09/27/2016, [facility] in [place], [place]     knee surgery Left 2019   arthroscopic for meniscus tear   LYMPH NODE BIOPSY Left    left arm    There were no vitals filed for this visit.   Subjective Assessment - 09/30/19 0733    Subjective COVID-19 screen performed prior to patient entering clinic. Reports continued soreness and difficulty with rotation and thumb pain.    Pertinent History Arthritis, GERD, migraines, left knee surgery, cholecystectomy.    Patient Stated Goals Use hands without pain.    Currently in  Pain? Yes    Pain Score 3     Pain Location Wrist    Pain Orientation Right;Left    Pain Descriptors / Indicators Sore;Discomfort    Pain Type Surgical pain    Pain Onset More than a month ago    Pain Frequency Constant              [facility] PT Assessment - 09/30/19 0001      Assessment   Medical Diagnosis Bilateral wrist pain.    Referring Provider (PT) Erline Levine MD      Precautions   Precautions None                         [facility] Adult PT Treatment/Exercise - 09/30/19 0001      Modalities   Modalities Electrical Stimulation;Ultrasound      Acupuncturist Location B wrist/thenar prominence    Electrical Stimulation Action Pre-Mod    Electrical Stimulation Parameters 80-150 hz x10 min    Electrical Stimulation Goals Pain      Ultrasound   Ultrasound Location B wrist extensors, thenar prominance    Ultrasound Parameters 3.3 mhz, 100% x10 min    Ultrasound Goals Pain      Manual Therapy   Manual Therapy Soft tissue mobilization  Soft tissue mobilization STW to B wrist extensors, abductor pollicis longus along with scar mobilization to reduce pain and soreness                       PT Long Term Goals - 09/16/19 1418      PT LONG TERM GOAL #1   Title Independent with HEP    Time 6    Status New      PT LONG TERM GOAL #2   Title Bilateral active wrist extension to 55 degrees.    Time 6    Period Weeks    Status New      PT LONG TERM GOAL #3   Title Bilateral grip strength= 20#.    Time 6    Period Weeks    Status New      PT LONG TERM GOAL #4   Title Perform ADL's with bilateral wrist pain not > 3/10.    Time 6    Period Weeks    Status New                 Plan - 09/30/19 0827    Clinical Impression Statement Patient performed on patient upon arrival with pain and soreness along wrist extensors as well as in region of abductor pollicis longus and ECRB. Patient compliant with HEP  and stretches at home. Normal modalities response noted following removal of the modalities. Patient reports limited thumb mobility at this time and limited scar mobility.    Personal Factors and Comorbidities Comorbidity 1;Comorbidity 2    Comorbidities Arthritis, GERD, migraines, left knee surgery, cholecystectomy.    Examination-Activity Limitations Carry;Other    Examination-Participation Restrictions Other    Stability/Clinical Decision Making Stable/Uncomplicated    Rehab Potential Good    PT Duration 6 weeks    PT Treatment/Interventions ADLs/Self Care Home Management;Cryotherapy;Electrical Stimulation;Ultrasound;Moist Heat;Iontophoresis 4mg /ml Dexamethasone;Therapeutic activities;Manual techniques;Patient/family education;Scar mobilization;Passive range of motion    PT Next Visit Plan Pulsed [place]; STW/M/scar massage/mobility; bilateral wrist extension stretching.  Progress per CTR protocol.    Consulted and Agree with Plan of Care Patient           Patient will benefit from skilled therapeutic intervention in order to improve the following deficits and impairments:  Pain, Decreased range of motion, Decreased strength, Decreased activity tolerance  Visit Diagnosis: Pain in left wrist  Pain in right wrist     Problem List Patient Active Problem List   Diagnosis Date Noted   Bilateral carpal tunnel syndrome 03/03/2019   C6 radiculopathy 03/03/2019   Acute medial meniscal tear, left, initial encounter 12/07/2017   Other cervical disc degeneration, unspecified cervical region 11/07/2017   Constipation due to outlet dysfunction    Osteopenia with high risk of fracture 02/05/2015   BMI 26.0-26.9,adult 05/21/2014   Heel spur 05/21/2014   Migraines 12/20/2012   Insomnia 12/20/2012   Depression 12/20/2012   GERD (gastroesophageal reflux disease) 12/20/2012   Hyperlipidemia with target LDL less than 100 12/20/2012    Standley Brooking, PTA 09/30/2019, 8:58 AM  [facility] [place], [name], [address] Phone: [phone]   Fax:  [phone]  Name: Selena Barnes MRN: [id number] Date of Birth: 02-04-1951

## 2019-10-01 DIAGNOSIS — M19049 Primary osteoarthritis, unspecified hand: Secondary | ICD-10-CM | POA: Insufficient documentation

## 2019-10-01 DIAGNOSIS — Z6828 Body mass index (BMI) 28.0-28.9, adult: Secondary | ICD-10-CM | POA: Diagnosis not present

## 2019-10-01 DIAGNOSIS — M25531 Pain in right wrist: Secondary | ICD-10-CM | POA: Diagnosis not present

## 2019-10-01 DIAGNOSIS — R030 Elevated blood-pressure reading, without diagnosis of hypertension: Secondary | ICD-10-CM | POA: Diagnosis not present

## 2019-10-02 ENCOUNTER — Ambulatory Visit: Payer: Medicare Other | Admitting: Physical Therapy

## 2019-10-02 ENCOUNTER — Encounter: Payer: Self-pay | Admitting: Physical Therapy

## 2019-10-02 ENCOUNTER — Other Ambulatory Visit: Payer: Self-pay

## 2019-10-02 DIAGNOSIS — M25531 Pain in right wrist: Secondary | ICD-10-CM | POA: Diagnosis not present

## 2019-10-02 DIAGNOSIS — M25532 Pain in left wrist: Secondary | ICD-10-CM | POA: Diagnosis not present

## 2019-10-02 NOTE — Therapy (Signed)
[facility] [place], [name], [address] Phone: [phone]   Fax:  [phone]  Physical Therapy Treatment  Patient Details  Name: Selena Barnes MRN: [id number] Date of Birth: [birth year] Referring Provider (PT): Erline Levine MD   Encounter Date: 10/02/2019   PT End of Session - 10/02/19 0732    Visit Number 6    Number of Visits 12    Date for PT Re-Evaluation 10/28/19    PT Start Time 0733    PT Stop Time 0815    PT Time Calculation (min) 42 min    Activity Tolerance Patient tolerated treatment well    Behavior During Therapy [facility] for tasks assessed/performed           Past Medical History:  Diagnosis Date  . Arthritis   . Constipation   . Depression   . GERD (gastroesophageal reflux disease)   . Hyperlipidemia   . Insomnia   . Migraines   . Osteopenia     Past Surgical History:  Procedure Laterality Date  . ABDOMINAL HYSTERECTOMY    . ANAL RECTAL MANOMETRY N/A 10/06/2015   Procedure: ANO RECTAL MANOMETRY;  Surgeon: Gatha Mayer, MD;  Location: [facility];  Service: Endoscopy;  Laterality: N/A;  . BREAST BIOPSY Right   . BREAST EXCISIONAL BIOPSY Right    benign  . BREAST EXCISIONAL BIOPSY Left 2000   axillary area. lymph node biopsy- benign  . CATARACT EXTRACTION W/ INTRAOCULAR LENS IMPLANT Bilateral 07/27/2016   and 09/27/2016, [employer] in [place]  . CATARACT EXTRACTION, BILATERAL    . CHOLECYSTECTOMY    . knee surgery Left 2019   arthroscopic for meniscus tear  . LYMPH NODE BIOPSY Left    left arm    There were no vitals filed for this visit.   Subjective Assessment - 10/02/19 0731    Subjective COVID-19 screen performed prior to patient entering clinic. Seen MD and was given injections in B thumbs.    Pertinent History Arthritis, GERD, migraines, left knee surgery, cholecystectomy.    Patient Stated Goals Use hands without pain.    Currently in Pain? Yes    Pain Score --    No pain score provided   Pain Location Other (Comment)   thumb   Pain Orientation Right;Left    Pain Descriptors / Indicators Sore    Pain Type Acute pain    Pain Onset More than a month ago    Pain Frequency Constant              [facility] PT Assessment - 10/02/19 0001      Assessment   Medical Diagnosis Bilateral wrist pain.    Referring Provider (PT) Erline Levine MD      Precautions   Precautions None                         [facility] Adult PT Treatment/Exercise - 10/02/19 0001      Modalities   Modalities Electrical Stimulation;Ultrasound      Electrical Stimulation   Electrical Stimulation Location B wrist    Electrical Stimulation Action IFC    Electrical Stimulation Parameters 80-150 hz x10 min    Electrical Stimulation Goals Pain      Ultrasound   Ultrasound Location B wrist flexors    Ultrasound Parameters Combo 3.3 mhz, 100%, 1.2 w/cm2, 100% x10 min    Ultrasound Goals Pain      Manual Therapy  Manual Therapy Soft tissue mobilization    Soft tissue mobilization STW to B wrist flexors, abductor pollicis longus along with scar mobilization to reduce pain and soreness                       PT Long Term Goals - 10/02/19 0807      PT LONG TERM GOAL #1   Title Independent with HEP    Time 6    Period Weeks    Status Achieved      PT LONG TERM GOAL #2   Title Bilateral active wrist extension to 55 degrees.    Time 6    Period Weeks    Status On-going      PT LONG TERM GOAL #3   Title Bilateral grip strength= 20#.    Time 6    Period Weeks    Status On-going      PT LONG TERM GOAL #4   Title Perform ADL's with bilateral wrist pain not > 3/10.    Time 6    Period Weeks    Status On-going                 Plan - 10/02/19 0810    Clinical Impression Statement Patient presented in clinic after recieving B thumb injections yesterday. Patient states that the area in which she recieved the injections was sore but did not  provided numerical rating. Patient did not report any increased tenderness or soreness during treatment. Patient compliant with wrist/thumb AROM and yellow putty gripping per patient report. Normal modalities response noted following removal of the modalities.    Personal Factors and Comorbidities Comorbidity 1;Comorbidity 2    Comorbidities Arthritis, GERD, migraines, left knee surgery, cholecystectomy.    Examination-Activity Limitations Carry;Other    Examination-Participation Restrictions Other    Stability/Clinical Decision Making Stable/Uncomplicated    Rehab Potential Good    PT Duration 6 weeks    PT Treatment/Interventions ADLs/Self Care Home Management;Cryotherapy;Electrical Stimulation;Ultrasound;Moist Heat;Iontophoresis 4mg /ml Dexamethasone;Therapeutic activities;Manual techniques;Patient/family education;Scar mobilization;Passive range of motion    PT Next Visit Plan Pulsed [place]; STW/M/scar massage/mobility; bilateral wrist extension stretching.  Progress per CTR protocol.    Consulted and Agree with Plan of Care Patient           Patient will benefit from skilled therapeutic intervention in order to improve the following deficits and impairments:  Pain, Decreased range of motion, Decreased strength, Decreased activity tolerance  Visit Diagnosis: Pain in left wrist  Pain in right wrist     Problem List Patient Active Problem List   Diagnosis Date Noted  . Bilateral carpal tunnel syndrome 03/03/2019  . C6 radiculopathy 03/03/2019  . Acute medial meniscal tear, left, initial encounter 12/07/2017  . Other cervical disc degeneration, unspecified cervical region 11/07/2017  . Constipation due to outlet dysfunction   . Osteopenia with high risk of fracture 02/05/2015  . BMI 26.0-26.9,adult 05/21/2014  . Heel spur 05/21/2014  . Migraines 12/20/2012  . Insomnia 12/20/2012  . Depression 12/20/2012  . GERD (gastroesophageal reflux disease) 12/20/2012  . Hyperlipidemia with  target LDL less than 100 12/20/2012    Standley Brooking, PTA 10/02/2019, 8:21 AM  [facility] [place], [name], [address] Phone: [phone]   Fax:  [phone]  Name: Selena Barnes MRN: [id number] Date of Birth: April 28, 1950

## 2019-10-07 ENCOUNTER — Encounter: Payer: Self-pay | Admitting: Physical Therapy

## 2019-10-07 ENCOUNTER — Ambulatory Visit: Payer: Medicare Other | Admitting: Physical Therapy

## 2019-10-07 ENCOUNTER — Other Ambulatory Visit: Payer: Self-pay

## 2019-10-07 DIAGNOSIS — M25531 Pain in right wrist: Secondary | ICD-10-CM

## 2019-10-07 DIAGNOSIS — M25532 Pain in left wrist: Secondary | ICD-10-CM

## 2019-10-07 NOTE — Therapy (Signed)
[facility] [place], [name], [address] Phone: [phone]   Fax:  [phone]  Physical Therapy Treatment  Patient Details  Name: Selena Barnes MRN: [id number] Date of Birth: 01/29/1951 Referring Provider (PT): Erline Levine MD   Encounter Date: 10/07/2019   PT End of Session - 10/07/19 0823    Visit Number 7    Number of Visits 12    Date for PT Re-Evaluation 10/28/19    PT Start Time 0731    PT Stop Time 0817    PT Time Calculation (min) 46 min    Activity Tolerance Patient tolerated treatment well    Behavior During Therapy [facility] for tasks assessed/performed           Past Medical History:  Diagnosis Date  . Arthritis   . Constipation   . Depression   . GERD (gastroesophageal reflux disease)   . Hyperlipidemia   . Insomnia   . Migraines   . Osteopenia     Past Surgical History:  Procedure Laterality Date  . ABDOMINAL HYSTERECTOMY    . ANAL RECTAL MANOMETRY N/A 10/06/2015   Procedure: ANO RECTAL MANOMETRY;  Surgeon: Gatha Mayer, MD;  Location: [facility];  Service: Endoscopy;  Laterality: N/A;  . BREAST BIOPSY Right   . BREAST EXCISIONAL BIOPSY Right    benign  . BREAST EXCISIONAL BIOPSY Left 2000   axillary area. lymph node biopsy- benign  . CATARACT EXTRACTION W/ INTRAOCULAR LENS IMPLANT Bilateral 07/27/2016   and 09/27/2016, [employer] in [place]  . CATARACT EXTRACTION, BILATERAL    . CHOLECYSTECTOMY    . knee surgery Left 2019   arthroscopic for meniscus tear  . LYMPH NODE BIOPSY Left    left arm    There were no vitals filed for this visit.   Subjective Assessment - 10/07/19 0823    Subjective COVID-19 screen performed prior to patient entering clinic. Reports ongoing pain but low today. Still trouble with opening jars.    Pertinent History Arthritis, GERD, migraines, left knee surgery, cholecystectomy.    Patient Stated Goals Use hands without pain.    Currently in Pain?  Yes    Pain Score 1     Pain Location --   thumbs   Pain Orientation Right;Left    Pain Descriptors / Indicators Sore    Pain Type Acute pain    Pain Onset More than a month ago    Pain Frequency Constant              [facility] PT Assessment - 10/07/19 0001      Assessment   Medical Diagnosis Bilateral wrist pain.    Referring Provider (PT) Erline Levine MD                         [facility] Adult PT Treatment/Exercise - 10/07/19 0001      Modalities   Modalities Electrical Stimulation;Ultrasound;Moist Heat      Moist Heat Therapy   Number Minutes Moist Heat 10 Minutes    Moist Heat Location Wrist      Electrical Stimulation   Electrical Stimulation Location B wrist    Electrical Stimulation Action pre-mod    Electrical Stimulation Parameters 80-150 hz x10 mins    Electrical Stimulation Goals Pain      Ultrasound   Ultrasound Location B wrist flexors    Ultrasound Parameters combo 3.3 mhz, 50%, 1.5 w/cm2 x12 mins    Ultrasound Goals  Pain      Manual Therapy   Manual Therapy Soft tissue mobilization    Soft tissue mobilization STW to B wrist flexors, abductor pollicis longus along with scar mobilization to reduce pain and soreness                       PT Long Term Goals - 10/02/19 0807      PT LONG TERM GOAL #1   Title Independent with HEP    Time 6    Period Weeks    Status Achieved      PT LONG TERM GOAL #2   Title Bilateral active wrist extension to 55 degrees.    Time 6    Period Weeks    Status On-going      PT LONG TERM GOAL #3   Title Bilateral grip strength= 20#.    Time 6    Period Weeks    Status On-going      PT LONG TERM GOAL #4   Title Perform ADL's with bilateral wrist pain not > 3/10.    Time 6    Period Weeks    Status On-going                 Plan - 10/07/19 2423    Clinical Impression Statement Patinet responded well to therapy session with no reports of increased pain. Patient noted with right  thumb bruising secondary to the injection. STW/M and scar mobilization performed with good response. No adverse effects upon removal of modalities.    Personal Factors and Comorbidities Comorbidity 1;Comorbidity 2    Comorbidities Arthritis, GERD, migraines, left knee surgery, cholecystectomy.    Examination-Activity Limitations Carry;Other    Examination-Participation Restrictions Other    Stability/Clinical Decision Making Stable/Uncomplicated    Clinical Decision Making Low    Rehab Potential Good    PT Duration 6 weeks    PT Treatment/Interventions ADLs/Self Care Home Management;Cryotherapy;Electrical Stimulation;Ultrasound;Moist Heat;Iontophoresis 4mg /ml Dexamethasone;Therapeutic activities;Manual techniques;Patient/family education;Scar mobilization;Passive range of motion    PT Next Visit Plan Pulsed [place]; STW/M/scar massage/mobility; bilateral wrist extension stretching.  Progress per CTR protocol.    Consulted and Agree with Plan of Care Patient           Patient will benefit from skilled therapeutic intervention in order to improve the following deficits and impairments:  Pain, Decreased range of motion, Decreased strength, Decreased activity tolerance  Visit Diagnosis: Pain in left wrist  Pain in right wrist     Problem List Patient Active Problem List   Diagnosis Date Noted  . Bilateral carpal tunnel syndrome 03/03/2019  . C6 radiculopathy 03/03/2019  . Acute medial meniscal tear, left, initial encounter 12/07/2017  . Other cervical disc degeneration, unspecified cervical region 11/07/2017  . Constipation due to outlet dysfunction   . Osteopenia with high risk of fracture 02/05/2015  . BMI 26.0-26.9,adult 05/21/2014  . Heel spur 05/21/2014  . Migraines 12/20/2012  . Insomnia 12/20/2012  . Depression 12/20/2012  . GERD (gastroesophageal reflux disease) 12/20/2012  . Hyperlipidemia with target LDL less than 100 12/20/2012    Gabriela Eves, PT, DPT 10/07/2019,  8:30 AM  [facility] [place], [name], [address] Phone: [phone]   Fax:  [phone]  Name: RAKEB KIBBLE MRN: [id number] Date of Birth: 1950/12/13

## 2019-10-09 ENCOUNTER — Other Ambulatory Visit: Payer: Self-pay

## 2019-10-09 ENCOUNTER — Ambulatory Visit: Payer: Medicare Other | Admitting: Physical Therapy

## 2019-10-09 DIAGNOSIS — M25532 Pain in left wrist: Secondary | ICD-10-CM

## 2019-10-09 DIAGNOSIS — M25531 Pain in right wrist: Secondary | ICD-10-CM

## 2019-10-09 NOTE — Therapy (Signed)
[facility] [place], [name], [address] Phone: [phone]   Fax:  [phone]  Physical Therapy Treatment  Patient Details  Name: Selena Barnes MRN: [id number] Date of Birth: 10-02-1950 Referring Provider (PT): Erline Levine MD   Encounter Date: 10/09/2019   PT End of Session - 10/09/19 0809    Visit Number 8    Number of Visits 12    Date for PT Re-Evaluation 10/28/19    PT Start Time 0730    PT Stop Time 0813    PT Time Calculation (min) 43 min    Activity Tolerance Patient tolerated treatment well    Behavior During Therapy [facility] for tasks assessed/performed           Past Medical History:  Diagnosis Date  . Arthritis   . Constipation   . Depression   . GERD (gastroesophageal reflux disease)   . Hyperlipidemia   . Insomnia   . Migraines   . Osteopenia     Past Surgical History:  Procedure Laterality Date  . ABDOMINAL HYSTERECTOMY    . ANAL RECTAL MANOMETRY N/A 10/06/2015   Procedure: ANO RECTAL MANOMETRY;  Surgeon: Gatha Mayer, MD;  Location: [facility];  Service: Endoscopy;  Laterality: N/A;  . BREAST BIOPSY Right   . BREAST EXCISIONAL BIOPSY Right    benign  . BREAST EXCISIONAL BIOPSY Left 2000   axillary area. lymph node biopsy- benign  . CATARACT EXTRACTION W/ INTRAOCULAR LENS IMPLANT Bilateral 07/27/2016   and 09/27/2016, [employer] in [place]  . CATARACT EXTRACTION, BILATERAL    . CHOLECYSTECTOMY    . knee surgery Left 2019   arthroscopic for meniscus tear  . LYMPH NODE BIOPSY Left    left arm    There were no vitals filed for this visit.   Subjective Assessment - 10/09/19 0734    Subjective COVID-19 screen performed prior to patient entering clinic. Patient arrived "Doing better".    Pertinent History Arthritis, GERD, migraines, left knee surgery, cholecystectomy.    Patient Stated Goals Use hands without pain.    Currently in Pain? Yes    Pain Score 1     Pain  Location Hand    Pain Orientation Right;Left    Pain Descriptors / Indicators Discomfort;Sore    Pain Type Acute pain    Pain Onset More than a month ago    Pain Frequency Intermittent    Aggravating Factors  movement    Pain Relieving Factors rest              [facility] PT Assessment - 10/09/19 0001      Strength   Overall Strength Comments Right grip 3# /Left grip 3#                         [facility] Adult PT Treatment/Exercise - 10/09/19 0001      Moist Heat Therapy   Number Minutes Moist Heat 10 Minutes    Moist Heat Location Wrist   Bil     Electrical Stimulation   Electrical Stimulation Location B wrist    Electrical Stimulation Action premod    Electrical Stimulation Parameters 80-150hz  x3min    Electrical Stimulation Goals Pain      Ultrasound   Ultrasound Location B Wrist Flexors    Ultrasound Parameters combo US/ES @ 1.5w/cm2/50%/3.2mhz x 49min each (12 total)    Ultrasound Goals Pain      Manual Therapy   Manual  Therapy Soft tissue mobilization    Soft tissue mobilization STW to B wrist flexors, abductor pollicis longus along with scar mobilization to reduce pain and soreness                       PT Long Term Goals - 10/09/19 0736      PT LONG TERM GOAL #1   Title Independent with HEP    Time 6    Period Weeks    Status Achieved      PT LONG TERM GOAL #2   Title Bilateral active wrist extension to 55 degrees.    Time 6    Period Weeks    Status On-going      PT LONG TERM GOAL #3   Title Bilateral grip strength= 20#.    Baseline Right grip 3# / Left grip 3# 10/09/19    Time 6    Period Weeks    Status On-going      PT LONG TERM GOAL #4   Title Perform ADL's with bilateral wrist pain not > 3/10.    Baseline 1/10 pain yet unable to perform ADL's due to weakness 10/09/19    Time 6    Period Weeks    Status On-going                 Plan - 10/09/19 5701    Clinical Impression Statement Patient tolerated treatment  well today. Patient has minimal discomfort yet ongoing weakness and unable to use bil UE for ADL's at this time. Patient grip is 3# bil sides today. Goals progressing.    Personal Factors and Comorbidities Comorbidity 1;Comorbidity 2    Comorbidities Arthritis, GERD, migraines, left knee surgery, cholecystectomy.    Examination-Activity Limitations Carry;Other    Examination-Participation Restrictions Other    Stability/Clinical Decision Making Stable/Uncomplicated    Rehab Potential Good    PT Duration 6 weeks    PT Treatment/Interventions ADLs/Self Care Home Management;Cryotherapy;Electrical Stimulation;Ultrasound;Moist Heat;Iontophoresis 4mg /ml Dexamethasone;Therapeutic activities;Manual techniques;Patient/family education;Scar mobilization;Passive range of motion    PT Next Visit Plan Pulsed [place]; STW/M/scar massage/mobility; bilateral wrist extension stretching.  Progress per CTR protocol.    Consulted and Agree with Plan of Care Patient           Patient will benefit from skilled therapeutic intervention in order to improve the following deficits and impairments:  Pain, Decreased range of motion, Decreased strength, Decreased activity tolerance  Visit Diagnosis: Pain in left wrist  Pain in right wrist     Problem List Patient Active Problem List   Diagnosis Date Noted  . Bilateral carpal tunnel syndrome 03/03/2019  . C6 radiculopathy 03/03/2019  . Acute medial meniscal tear, left, initial encounter 12/07/2017  . Other cervical disc degeneration, unspecified cervical region 11/07/2017  . Constipation due to outlet dysfunction   . Osteopenia with high risk of fracture 02/05/2015  . BMI 26.0-26.9,adult 05/21/2014  . Heel spur 05/21/2014  . Migraines 12/20/2012  . Insomnia 12/20/2012  . Depression 12/20/2012  . GERD (gastroesophageal reflux disease) 12/20/2012  . Hyperlipidemia with target LDL less than 100 12/20/2012    Gaetan Spieker P, PTA 10/09/2019, 8:18  AM  [facility] [place], [name], [address] Phone: [phone]   Fax:  [phone]  Name: Selena Barnes MRN: [id number] Date of Birth: 23-May-1950

## 2019-10-14 ENCOUNTER — Other Ambulatory Visit: Payer: Self-pay

## 2019-10-14 ENCOUNTER — Encounter: Payer: Self-pay | Admitting: Physical Therapy

## 2019-10-14 ENCOUNTER — Ambulatory Visit: Payer: Medicare Other | Admitting: Physical Therapy

## 2019-10-14 DIAGNOSIS — M25532 Pain in left wrist: Secondary | ICD-10-CM

## 2019-10-14 DIAGNOSIS — M25531 Pain in right wrist: Secondary | ICD-10-CM | POA: Diagnosis not present

## 2019-10-14 NOTE — Therapy (Signed)
[facility] [place], [name], [address] Phone: [phone]   Fax:  [phone]  Physical Therapy Treatment  Patient Details  Name: Selena Barnes MRN: [id number] Date of Birth: March 12, 1950 Referring Provider (PT): Erline Levine MD   Encounter Date: 10/14/2019   PT End of Session - 10/14/19 0732    Visit Number 9    Number of Visits 12    Date for PT Re-Evaluation 10/28/19    PT Start Time 0733    PT Stop Time 0818    PT Time Calculation (min) 45 min    Activity Tolerance Patient tolerated treatment well    Behavior During Therapy [facility] for tasks assessed/performed           Past Medical History:  Diagnosis Date  . Arthritis   . Constipation   . Depression   . GERD (gastroesophageal reflux disease)   . Hyperlipidemia   . Insomnia   . Migraines   . Osteopenia     Past Surgical History:  Procedure Laterality Date  . ABDOMINAL HYSTERECTOMY    . ANAL RECTAL MANOMETRY N/A 10/06/2015   Procedure: ANO RECTAL MANOMETRY;  Surgeon: Gatha Mayer, MD;  Location: [facility];  Service: Endoscopy;  Laterality: N/A;  . BREAST BIOPSY Right   . BREAST EXCISIONAL BIOPSY Right    benign  . BREAST EXCISIONAL BIOPSY Left 2000   axillary area. lymph node biopsy- benign  . CATARACT EXTRACTION W/ INTRAOCULAR LENS IMPLANT Bilateral 07/27/2016   and 09/27/2016, [employer] in [place]  . CATARACT EXTRACTION, BILATERAL    . CHOLECYSTECTOMY    . knee surgery Left 2019   arthroscopic for meniscus tear  . LYMPH NODE BIOPSY Left    left arm    There were no vitals filed for this visit.   Subjective Assessment - 10/14/19 0731    Subjective COVID 19 screening performed on patient upon arrival. Patient reports some improvement but still getting abductor pollicis discomfort and pain intermittantly with picking things up or swatting a fly, etc.    Pertinent History Arthritis, GERD, migraines, left knee surgery,  cholecystectomy.    Patient Stated Goals Use hands without pain.    Currently in Pain? Yes    Pain Score 1     Pain Location Hand    Pain Orientation Right;Left    Pain Descriptors / Indicators Discomfort    Pain Type Acute pain    Pain Onset More than a month ago    Pain Frequency Intermittent              [facility] PT Assessment - 10/14/19 0001      Assessment   Medical Diagnosis Bilateral wrist pain.    Referring Provider (PT) Erline Levine MD    Hand Dominance Right      Precautions   Precautions None                         [facility] Adult PT Treatment/Exercise - 10/14/19 0001      Modalities   Modalities Electrical Stimulation;Ultrasound      Electrical Stimulation   Electrical Stimulation Location B wrist    Electrical Stimulation Action Pre-Mod    Electrical Stimulation Parameters 80-150 hz x15 min    Electrical Stimulation Goals Pain      Ultrasound   Ultrasound Location B wrist flexors, pollicis     Ultrasound Parameters Combo 1.5 w/cm2, 100%, 1 mhz x8 min each  Ultrasound Goals Pain      Manual Therapy   Manual Therapy Soft tissue mobilization    Soft tissue mobilization STW to B wrist flexors, abductor pollicis longus along with scar mobilization to reduce pain and soreness                       PT Long Term Goals - 10/09/19 0736      PT LONG TERM GOAL #1   Title Independent with HEP    Time 6    Period Weeks    Status Achieved      PT LONG TERM GOAL #2   Title Bilateral active wrist extension to 55 degrees.    Time 6    Period Weeks    Status On-going      PT LONG TERM GOAL #3   Title Bilateral grip strength= 20#.    Baseline Right grip 3# / Left grip 3# 10/09/19    Time 6    Period Weeks    Status On-going      PT LONG TERM GOAL #4   Title Perform ADL's with bilateral wrist pain not > 3/10.    Baseline 1/10 pain yet unable to perform ADL's due to weakness 10/09/19    Time 6    Period Weeks    Status On-going                  Plan - 10/14/19 0815    Clinical Impression Statement Patient presented in clinic with reports of continued minimal improvement but still having sharp pain with intermittant motions such as picking items up. Patient reporting continued limitations with thumb mobility with ADLs. An area of possible edema noted over distal radius and ulna along posterior aspect of L wrist but not present on R wrist. Normal modalities response noted following removal of the modalities.    Personal Factors and Comorbidities Comorbidity 1;Comorbidity 2    Comorbidities Arthritis, GERD, migraines, left knee surgery, cholecystectomy.    Examination-Activity Limitations Carry;Other    Examination-Participation Restrictions Other    Stability/Clinical Decision Making Stable/Uncomplicated    Rehab Potential Good    PT Duration 6 weeks    PT Treatment/Interventions ADLs/Self Care Home Management;Cryotherapy;Electrical Stimulation;Ultrasound;Moist Heat;Iontophoresis 4mg /ml Dexamethasone;Therapeutic activities;Manual techniques;Patient/family education;Scar mobilization;Passive range of motion    PT Next Visit Plan Pulsed [place]; STW/M/scar massage/mobility; bilateral wrist extension stretching.  Progress per [facility] protocol.    Consulted and Agree with Plan of Care Patient           Patient will benefit from skilled therapeutic intervention in order to improve the following deficits and impairments:  Pain, Decreased range of motion, Decreased strength, Decreased activity tolerance  Visit Diagnosis: Pain in left wrist  Pain in right wrist     Problem List Patient Active Problem List   Diagnosis Date Noted  . Bilateral carpal tunnel syndrome 03/03/2019  . C6 radiculopathy 03/03/2019  . Acute medial meniscal tear, left, initial encounter 12/07/2017  . Other cervical disc degeneration, unspecified cervical region 11/07/2017  . Constipation due to outlet dysfunction   . Osteopenia with high risk  of fracture 02/05/2015  . BMI 26.0-26.9,adult 05/21/2014  . Heel spur 05/21/2014  . Migraines 12/20/2012  . Insomnia 12/20/2012  . Depression 12/20/2012  . GERD (gastroesophageal reflux disease) 12/20/2012  . Hyperlipidemia with target LDL less than 100 12/20/2012    Standley Brooking, PTA 10/14/2019, 8:26 AM  [facility] [place],  [name], [address] Phone: [phone]   Fax:  [phone]  Name: Selena Barnes MRN: [id number] Date of Birth: 02-15-1950

## 2019-10-16 ENCOUNTER — Ambulatory Visit: Payer: Medicare Other | Attending: Neurosurgery | Admitting: Physical Therapy

## 2019-10-16 ENCOUNTER — Other Ambulatory Visit: Payer: Self-pay

## 2019-10-16 DIAGNOSIS — M25532 Pain in left wrist: Secondary | ICD-10-CM | POA: Insufficient documentation

## 2019-10-16 DIAGNOSIS — M25531 Pain in right wrist: Secondary | ICD-10-CM | POA: Diagnosis not present

## 2019-10-16 NOTE — Therapy (Signed)
[facility] [place], [name], [address] Phone: [phone]   Fax:  [phone]  Physical Therapy Treatment  Patient Details  Name: Selena Barnes Date of Birth: November 21, 1950 Referring Provider (PT): Selena Levine MD   Encounter Date: 10/16/2019   PT End of Session - 10/16/19 0809    Visit Number 10    Number of Visits 12    Date for PT Re-Evaluation 10/28/19    PT Start Time 0731    PT Stop Time 0820    PT Time Calculation (min) 49 min    Activity Tolerance Patient tolerated treatment well    Behavior During Therapy Selena Barnes for tasks assessed/performed           Past Medical History:  Diagnosis Date  . Arthritis   . Constipation   . Depression   . GERD (gastroesophageal reflux disease)   . Hyperlipidemia   . Insomnia   . Migraines   . Osteopenia     Past Surgical History:  Procedure Laterality Date  . ABDOMINAL HYSTERECTOMY    . ANAL RECTAL MANOMETRY N/A 10/06/2015   Procedure: ANO RECTAL MANOMETRY;  Surgeon: Selena Mayer, MD;  Location: [facility];  Service: Endoscopy;  Laterality: N/A;  . BREAST BIOPSY Right   . BREAST EXCISIONAL BIOPSY Right    benign  . BREAST EXCISIONAL BIOPSY Left 2000   axillary area. lymph node biopsy- benign  . CATARACT EXTRACTION W/ INTRAOCULAR LENS IMPLANT Bilateral 07/27/2016   and 09/27/2016, [employer] in [place]  . CATARACT EXTRACTION, BILATERAL    . CHOLECYSTECTOMY    . knee surgery Left 2019   arthroscopic for meniscus tear  . LYMPH NODE BIOPSY Left    left arm    There were no vitals filed for this visit.   Subjective Assessment - 10/16/19 0808    Subjective COVID 19 screening performed on patient upon arrival. Patient arrived with minimal discomfort    Pertinent History Arthritis, GERD, migraines, left knee surgery, cholecystectomy.    Patient Stated Goals Use hands without pain.    Currently in Pain? Yes    Pain Score 1     Pain  Location Wrist    Pain Orientation Right;Left    Pain Descriptors / Indicators Discomfort    Pain Type Acute pain    Pain Onset More than a month ago    Pain Frequency Intermittent    Aggravating Factors  increased movements    Pain Relieving Factors at rest                             [facility] Adult PT Treatment/Exercise - 10/16/19 0001      Moist Heat Therapy   Number Minutes Moist Heat 15 Minutes    Moist Heat Location Wrist      Electrical Stimulation   Electrical Stimulation Location B wrist    Electrical Stimulation Action premod    Electrical Stimulation Parameters 80-150hz  x70mn    Electrical Stimulation Goals Pain      Ultrasound   Ultrasound Location B wrist flexors    Ultrasound Parameters combo 1.5w/cm2/50%/3.375m x8m79meach    Ultrasound Goals Pain      Manual Therapy   Manual Therapy Soft tissue mobilization;Passive ROM    Soft tissue mobilization STW to B wrist flexors, abductor pollicis longus along with scar mobilization to reduce pain and soreness    Passive ROM PROM for measurements  of wrist ext                       PT Long Term Goals - 10/16/19 5916      PT LONG TERM GOAL #1   Title Independent with HEP    Time 6    Period Weeks    Status Achieved      PT LONG TERM GOAL #2   Title Bilateral active wrist extension to 55 degrees.    Baseline Met 10/16/19 Bil 60 degrees    Time 6    Period Weeks    Status Achieved      PT LONG TERM GOAL #3   Title Bilateral grip strength= 20#.    Baseline Right grip 3# / Left grip 3# 10/09/19    Time 6    Period Weeks    Status On-going      PT LONG TERM GOAL #4   Title Perform ADL's with bilateral wrist pain not > 3/10.    Baseline 1/10 pain yet unable to perform ADL's due to weakness and limitations 10/15/19    Time 6    Period Weeks    Status On-going                 Plan - 10/16/19 0813    Clinical Impression Statement Patient tolerated treatment well today.  Patient has reported overall improvement. Patient has increased bil wrist ext today and has reported improved mobility. Patient has limitations due to weight restriction per MD and is doing hand putty daily. Patient met LTG #2 today with others progressing.    Personal Factors and Comorbidities Comorbidity 1;Comorbidity 2    Comorbidities Arthritis, GERD, migraines, left knee surgery, cholecystectomy.    Examination-Activity Limitations Carry;Other    Examination-Participation Restrictions Other    Stability/Clinical Decision Making Stable/Uncomplicated    Rehab Potential Good    PT Duration 6 weeks    PT Treatment/Interventions ADLs/Self Care Home Management;Cryotherapy;Electrical Stimulation;Ultrasound;Moist Heat;Iontophoresis 8m/ml Dexamethasone;Therapeutic activities;Manual techniques;Patient/family education;Scar mobilization;Passive range of motion    PT Next Visit Plan Pulsed [place] STW/M/scar massage/mobility; bilateral wrist extension stretching.  Progress per [facility] protocol.    Consulted and Agree with Plan of Care Patient           Patient will benefit from skilled therapeutic intervention in order to improve the following deficits and impairments:  Pain, Decreased range of motion, Decreased strength, Decreased activity tolerance  Visit Diagnosis: Pain in left wrist  Pain in right wrist     Problem List Patient Active Problem List   Diagnosis Date Noted  . Bilateral carpal tunnel syndrome 03/03/2019  . C6 radiculopathy 03/03/2019  . Acute medial meniscal tear, left, initial encounter 12/07/2017  . Other cervical disc degeneration, unspecified cervical region 11/07/2017  . Constipation due to outlet dysfunction   . Osteopenia with high risk of fracture 02/05/2015  . BMI 26.0-26.9,adult 05/21/2014  . Heel spur 05/21/2014  . Migraines 12/20/2012  . Insomnia 12/20/2012  . Depression 12/20/2012  . GERD (gastroesophageal reflux disease) 12/20/2012  . Hyperlipidemia with  target LDL less than 100 12/20/2012    Selena Barnes P, PTA 10/16/2019, 8:25 AM  [facility] [address]Phone: [phone]  Fax:  [phone] Name: RBRINDY HIGGINBOTHAMMRN: [id number] of Birth: [birth year]  Progress Note Reporting Period 09/16/19 to 10/16/19  See note below for Objective Data and Assessment of Progress/Goals. LTG's #1 and #2 met.  Mali Applegate MPT

## 2019-10-21 ENCOUNTER — Ambulatory Visit: Payer: Medicare Other | Admitting: *Deleted

## 2019-10-24 ENCOUNTER — Encounter: Payer: Self-pay | Admitting: Physical Therapy

## 2019-10-24 ENCOUNTER — Other Ambulatory Visit: Payer: Self-pay

## 2019-10-24 ENCOUNTER — Ambulatory Visit: Payer: Medicare Other | Admitting: Physical Therapy

## 2019-10-24 DIAGNOSIS — M25532 Pain in left wrist: Secondary | ICD-10-CM

## 2019-10-24 DIAGNOSIS — M25531 Pain in right wrist: Secondary | ICD-10-CM

## 2019-10-24 NOTE — Therapy (Signed)
[facility] [place], [name], [address] Phone: [phone]   Fax:  [phone]  Physical Therapy Treatment  Patient Details  Name: Selena Barnes MRN: [id number] Date of Birth: 12-20-1950 Referring Provider (PT): Erline Levine MD   Encounter Date: 10/24/2019   PT End of Session - 10/24/19 0810    Visit Number 11    Number of Visits 12    Date for PT Re-Evaluation 10/28/19    PT Start Time 0737    PT Stop Time 0817    PT Time Calculation (min) 40 min    Activity Tolerance Patient tolerated treatment well    Behavior During Therapy [facility] for tasks assessed/performed           Past Medical History:  Diagnosis Date  . Arthritis   . Constipation   . Depression   . GERD (gastroesophageal reflux disease)   . Hyperlipidemia   . Insomnia   . Migraines   . Osteopenia     Past Surgical History:  Procedure Laterality Date  . ABDOMINAL HYSTERECTOMY    . ANAL RECTAL MANOMETRY N/A 10/06/2015   Procedure: ANO RECTAL MANOMETRY;  Surgeon: Gatha Mayer, MD;  Location: [facility];  Service: Endoscopy;  Laterality: N/A;  . BREAST BIOPSY Right   . BREAST EXCISIONAL BIOPSY Right    benign  . BREAST EXCISIONAL BIOPSY Left 2000   axillary area. lymph node biopsy- benign  . CATARACT EXTRACTION W/ INTRAOCULAR LENS IMPLANT Bilateral 07/27/2016   and 09/27/2016, [employer] in [place]  . CATARACT EXTRACTION, BILATERAL    . CHOLECYSTECTOMY    . knee surgery Left 2019   arthroscopic for meniscus tear  . LYMPH NODE BIOPSY Left    left arm    There were no vitals filed for this visit.   Subjective Assessment - 10/24/19 0733    Subjective COVID 19 screening performed on patient upon arrival. Patient arrived with nagging ache of B thumbs.    Pertinent History Arthritis, GERD, migraines, left knee surgery, cholecystectomy.    Patient Stated Goals Use hands without pain.    Currently in Pain? Yes    Pain Score 1      Pain Location Other (Comment)   thumb   Pain Orientation Right;Left    Pain Descriptors / Indicators Nagging;Aching    Pain Type Acute pain    Pain Onset More than a month ago    Pain Frequency Constant              [facility] PT Assessment - 10/24/19 0001      Assessment   Medical Diagnosis Bilateral wrist pain.    Referring Provider (PT) Erline Levine MD    Hand Dominance Right    Next MD Visit TBD      Precautions   Precautions None      ROM / Strength   AROM / PROM / Strength Strength      Strength   Overall Strength Deficits    Strength Assessment Site Hand    Right/Left hand Right;Left    Right Hand Grip (lbs) 3    Left Hand Grip (lbs) 2                         [facility] Adult PT Treatment/Exercise - 10/24/19 0001      Modalities   Modalities Electrical Stimulation;Ultrasound      Electrical Stimulation   Electrical Stimulation Location B wrist  Electrical Stimulation Action Pre-Mod    Electrical Stimulation Parameters 80-150 hz x10 min    Electrical Stimulation Goals Pain      Ultrasound   Ultrasound Location B wrist flexors    Ultrasound Parameters Combo 1.5 w/cm2, 100%, 1 mhz x8 min each    Ultrasound Goals Pain      Manual Therapy   Manual Therapy Myofascial release    Myofascial Release IASTW to B wrist flexors, thenar prominance                       PT Long Term Goals - 10/24/19 3474      PT LONG TERM GOAL #1   Title Independent with HEP    Time 6    Period Weeks    Status Achieved      PT LONG TERM GOAL #2   Title Bilateral active wrist extension to 55 degrees.    Baseline Met 10/16/19 Bil 60 degrees    Time 6    Period Weeks    Status Achieved      PT LONG TERM GOAL #3   Title Bilateral grip strength= 20#.    Baseline Right grip 3# / Left grip 3# 10/09/19    Time 6    Period Weeks    Status On-going      PT LONG TERM GOAL #4   Title Perform ADL's with bilateral wrist pain not > 3/10.    Baseline 1/10 pain  yet unable to perform ADL's due to weakness and limitations 10/15/19    Time 6    Period Weeks    Status On-going                 Plan - 10/24/19 0816    Clinical Impression Statement Patient presented in clinic with greater limitattions due to B thumb pain. Patient has been able to achieve more wrist AROM but sudden movements or twisting causes pain. Patient unable to hold certain items due to weakness and pain. B wrist MMT deficient at this time as listed in today's note. Mod redness response noted over distal forearm during IASTW session. Normal modalties response noted following removal of the modalities.    Personal Factors and Comorbidities Comorbidity 1;Comorbidity 2    Comorbidities Arthritis, GERD, migraines, left knee surgery, cholecystectomy.    Examination-Activity Limitations Carry;Other    Examination-Participation Restrictions Other    Stability/Clinical Decision Making Stable/Uncomplicated    Rehab Potential Good    PT Duration 6 weeks    PT Treatment/Interventions ADLs/Self Care Home Management;Cryotherapy;Electrical Stimulation;Ultrasound;Moist Heat;Iontophoresis 44m/ml Dexamethasone;Therapeutic activities;Manual techniques;Patient/family education;Scar mobilization;Passive range of motion    PT Next Visit Plan Pulsed [place] STW/M/scar massage/mobility; bilateral wrist extension stretching.  Progress per [facility] protocol.    Consulted and Agree with Plan of Care Patient           Patient will benefit from skilled therapeutic intervention in order to improve the following deficits and impairments:  Pain, Decreased range of motion, Decreased strength, Decreased activity tolerance  Visit Diagnosis: Pain in left wrist  Pain in right wrist     Problem List Patient Active Problem List   Diagnosis Date Noted  . Bilateral carpal tunnel syndrome 03/03/2019  . C6 radiculopathy 03/03/2019  . Acute medial meniscal tear, left, initial encounter 12/07/2017  . Other cervical  disc degeneration, unspecified cervical region 11/07/2017  . Constipation due to outlet dysfunction   . Osteopenia with high risk of fracture 02/05/2015  . BMI 26.0-26.9,adult  05/21/2014  . Heel spur 05/21/2014  . Migraines 12/20/2012  . Insomnia 12/20/2012  . Depression 12/20/2012  . GERD (gastroesophageal reflux disease) 12/20/2012  . Hyperlipidemia with target LDL less than 100 12/20/2012    Standley Brooking, PTA 10/24/2019, 8:56 AM  [facility] [address] [place], [name], [address] Phone: [phone]   Fax:  [phone]  Name: Selena Barnes MRN: [id number] Date of Birth: [birth year]

## 2019-10-27 ENCOUNTER — Encounter: Payer: Self-pay | Admitting: Physical Therapy

## 2019-10-27 ENCOUNTER — Other Ambulatory Visit: Payer: Self-pay

## 2019-10-27 ENCOUNTER — Ambulatory Visit: Payer: Medicare Other | Admitting: Physical Therapy

## 2019-10-27 DIAGNOSIS — M25531 Pain in right wrist: Secondary | ICD-10-CM | POA: Diagnosis not present

## 2019-10-27 DIAGNOSIS — M25532 Pain in left wrist: Secondary | ICD-10-CM

## 2019-10-27 NOTE — Therapy (Addendum)
[facility] [place], [name], [address] Phone: [phone]   Fax:  [phone]  Physical Therapy Treatment PHYSICAL THERAPY DISCHARGE SUMMARY  Visits from Start of Care: 12  Current functional level related to goals / functional outcomes: See below   Remaining deficits: See goals   Education / Equipment: HEP Plan: Patient agrees to discharge.  Patient goals were partially met. Patient is being discharged due to the patient's request.  ?????  Husband having surgery.  Gabriela Eves, PT, DPT   Patient Details  Name: Selena Barnes MRN: [id number] Date of Birth: Jan 26, 1951 Referring Provider (PT): Erline Levine MD   Encounter Date: 10/27/2019   PT End of Session - 10/27/19 0735    Visit Number 12    Number of Visits 12    Date for PT Re-Evaluation 10/28/19    PT Start Time 0735    PT Stop Time 0819    PT Time Calculation (min) 44 min    Activity Tolerance Patient tolerated treatment well    Behavior During Therapy [facility] for tasks assessed/performed           Past Medical History:  Diagnosis Date  . Arthritis   . Constipation   . Depression   . GERD (gastroesophageal reflux disease)   . Hyperlipidemia   . Insomnia   . Migraines   . Osteopenia     Past Surgical History:  Procedure Laterality Date  . ABDOMINAL HYSTERECTOMY    . ANAL RECTAL MANOMETRY N/A 10/06/2015   Procedure: ANO RECTAL MANOMETRY;  Surgeon: Gatha Mayer, MD;  Location: [facility];  Service: Endoscopy;  Laterality: N/A;  . BREAST BIOPSY Right   . BREAST EXCISIONAL BIOPSY Right    benign  . BREAST EXCISIONAL BIOPSY Left 2000   axillary area. lymph node biopsy- benign  . CATARACT EXTRACTION W/ INTRAOCULAR LENS IMPLANT Bilateral 07/27/2016   and 09/27/2016, [employer] in [place]  . CATARACT EXTRACTION, BILATERAL    . CHOLECYSTECTOMY    . knee surgery Left 2019   arthroscopic for meniscus tear  . LYMPH NODE BIOPSY  Left    left arm    There were no vitals filed for this visit.   Subjective Assessment - 10/27/19 0734    Subjective COVID 19 screening performed on patient upon arrival. Patient arrived with reports of increased pain in B thumbs.    Pertinent History Arthritis, GERD, migraines, left knee surgery, cholecystectomy.    Patient Stated Goals Use hands without pain.    Currently in Pain? Yes    Pain Score 1     Pain Location Other (Comment)   thumbs   Pain Orientation Right;Left    Pain Descriptors / Indicators Nagging;Aching    Pain Type Acute pain    Pain Onset More than a month ago    Pain Frequency Intermittent              [facility] PT Assessment - 10/27/19 0001      Assessment   Medical Diagnosis Bilateral wrist pain.    Referring Provider (PT) Erline Levine MD    Hand Dominance Right    Next MD Visit TBD      Precautions   Precautions None                         [facility] Adult PT Treatment/Exercise - 10/27/19 0001      Modalities   Modalities Electrical Stimulation;Ultrasound  Acupuncturist Location B wrist flexors    Electrical Stimulation Action Pre-Mod    Electrical Stimulation Parameters 80-150 hz x10 min    Electrical Stimulation Goals Pain      Ultrasound   Ultrasound Location B wrist flexors, thumb abductors    Ultrasound Parameters Combo 1.5 w/cm2, 100%, 1 mhz x8 min each    Ultrasound Goals Pain      Manual Therapy   Manual Therapy Myofascial release    Myofascial Release IASTW to B wrist flexors, thenar prominance, thumb abductors                       PT Long Term Goals - 10/27/19 1610      PT LONG TERM GOAL #1   Title Independent with HEP    Time 6    Period Weeks    Status Achieved      PT LONG TERM GOAL #2   Title Bilateral active wrist extension to 55 degrees.    Baseline Met 10/16/19 Bil 60 degrees    Time 6    Period Weeks    Status Achieved      PT LONG TERM GOAL #3    Title Bilateral grip strength= 20#.    Baseline Right grip 3# / Left grip 3# 10/09/19    Time 6    Period Weeks    Status Not Met      PT LONG TERM GOAL #4   Title Perform ADL's with bilateral wrist pain not > 3/10.    Baseline 1/10 pain yet unable to perform ADL's due to weakness and limitations 10/15/19    Time 6    Period Weeks    Status Not Met                 Plan - 10/27/19 9604    Clinical Impression Statement Patient still limited by weakness and intermittant B wrist pain due to movement. Patient's B wrist pain has seen minimal improvement since initial evaluation and has to hold items with B hands to avoid dropping them. IASTW completed to B wist flexors and thumb abductors with moderate redness response. Normal modalities response noted following removal of the modalities.    Personal Factors and Comorbidities Comorbidity 1;Comorbidity 2    Comorbidities Arthritis, GERD, migraines, left knee surgery, cholecystectomy.    Examination-Activity Limitations Carry;Other    Examination-Participation Restrictions Other    Stability/Clinical Decision Making Stable/Uncomplicated    Rehab Potential Good    PT Duration 6 weeks    PT Treatment/Interventions ADLs/Self Care Home Management;Cryotherapy;Electrical Stimulation;Ultrasound;Moist Heat;Iontophoresis 63m/ml Dexamethasone;Therapeutic activities;Manual techniques;Patient/family education;Scar mobilization;Passive range of motion    PT Next Visit Plan On hold due to husband's upcoming heart surgery.    Consulted and Agree with Plan of Care Patient           Patient will benefit from skilled therapeutic intervention in order to improve the following deficits and impairments:  Pain, Decreased range of motion, Decreased strength, Decreased activity tolerance  Visit Diagnosis: Pain in left wrist  Pain in right wrist     Problem List Patient Active Problem List   Diagnosis Date Noted  . Bilateral carpal tunnel syndrome  03/03/2019  . C6 radiculopathy 03/03/2019  . Acute medial meniscal tear, left, initial encounter 12/07/2017  . Other cervical disc degeneration, unspecified cervical region 11/07/2017  . Constipation due to outlet dysfunction   . Osteopenia with high risk of fracture 02/05/2015  .  BMI 26.0-26.9,adult 05/21/2014  . Heel spur 05/21/2014  . Migraines 12/20/2012  . Insomnia 12/20/2012  . Depression 12/20/2012  . GERD (gastroesophageal reflux disease) 12/20/2012  . Hyperlipidemia with target LDL less than 100 12/20/2012    Standley Brooking, PTA 10/27/19 8:31 AM   [facility] [place], [name], [address] Phone: [phone]   Fax:  [phone]  Name: Selena Barnes MRN: [id number] Date of Birth: 11-01-1950

## 2019-12-10 DIAGNOSIS — Z23 Encounter for immunization: Secondary | ICD-10-CM | POA: Diagnosis not present

## 2019-12-17 ENCOUNTER — Other Ambulatory Visit: Payer: Self-pay

## 2019-12-17 ENCOUNTER — Ambulatory Visit (INDEPENDENT_AMBULATORY_CARE_PROVIDER_SITE_OTHER): Payer: Medicare Other | Admitting: Family Medicine

## 2019-12-17 DIAGNOSIS — Z23 Encounter for immunization: Secondary | ICD-10-CM

## 2019-12-24 ENCOUNTER — Telehealth: Payer: Medicare Other | Admitting: *Deleted

## 2020-01-07 ENCOUNTER — Other Ambulatory Visit: Payer: Self-pay

## 2020-01-07 ENCOUNTER — Ambulatory Visit (INDEPENDENT_AMBULATORY_CARE_PROVIDER_SITE_OTHER): Payer: Medicare Other | Admitting: Family Medicine

## 2020-01-07 ENCOUNTER — Encounter: Payer: Self-pay | Admitting: Family Medicine

## 2020-01-07 VITALS — BP 123/77 | HR 86 | Temp 97.0°F | Ht 65.0 in | Wt 161.0 lb

## 2020-01-07 DIAGNOSIS — G43919 Migraine, unspecified, intractable, without status migrainosus: Secondary | ICD-10-CM

## 2020-01-07 DIAGNOSIS — K219 Gastro-esophageal reflux disease without esophagitis: Secondary | ICD-10-CM | POA: Diagnosis not present

## 2020-01-07 DIAGNOSIS — E785 Hyperlipidemia, unspecified: Secondary | ICD-10-CM

## 2020-01-07 MED ORDER — ROSUVASTATIN CALCIUM 20 MG PO TABS
20.0000 mg | ORAL_TABLET | Freq: Every day | ORAL | 3 refills | Status: DC
Start: 2020-01-07 — End: 2020-12-24

## 2020-01-07 MED ORDER — RIZATRIPTAN BENZOATE 10 MG PO TBDP
10.0000 mg | ORAL_TABLET | ORAL | 3 refills | Status: DC | PRN
Start: 2020-01-07 — End: 2020-12-24

## 2020-01-07 MED ORDER — DULOXETINE HCL 60 MG PO CPEP
60.0000 mg | ORAL_CAPSULE | Freq: Two times a day (BID) | ORAL | 3 refills | Status: DC
Start: 2020-01-07 — End: 2020-12-24

## 2020-01-07 MED ORDER — ESTRADIOL 0.1 MG/GM VA CREA
1.0000 | TOPICAL_CREAM | Freq: Every day | VAGINAL | 3 refills | Status: DC
Start: 2020-01-07 — End: 2020-12-24

## 2020-01-07 MED ORDER — AJOVY 225 MG/1.5ML ~~LOC~~ SOAJ: 225.0000 mg | SUBCUTANEOUS | 3 refills | Status: DC

## 2020-01-07 MED ORDER — ESOMEPRAZOLE MAGNESIUM 40 MG PO CPDR
40.0000 mg | DELAYED_RELEASE_CAPSULE | Freq: Every day | ORAL | 3 refills | Status: DC
Start: 2020-01-07 — End: 2020-12-24

## 2020-01-07 MED ORDER — TOPIRAMATE 100 MG PO TABS
100.0000 mg | ORAL_TABLET | Freq: Every day | ORAL | 3 refills | Status: DC
Start: 2020-01-07 — End: 2020-11-11

## 2020-01-07 MED ORDER — PROMETHAZINE HCL 25 MG PO TABS: 25.0000 mg | ORAL_TABLET | Freq: Four times a day (QID) | ORAL | 3 refills | Status: DC | PRN

## 2020-01-07 NOTE — Progress Notes (Signed)
BP 123/77   Pulse 86   Temp (!) 97 F (36.1 C)   Ht _0  (1.651 m)   Wt 161 lb (73 kg)   SpO2 97%   BMI 26.79 kg/m    Subjective:   Patient ID: Selena Barnes, female    DOB: [birth year], 69 y.o.   MRN: [id number]  HPI: Selena Barnes is a 69 y.o. female presenting on 01/07/2020 for Medical Management of Chronic Issues   HPI GERD Patient is currently on Nexium.  She denies any major symptoms or abdominal pain or belching or burping. She denies any blood in her stool or lightheadedness or dizziness.   Hyperlipidemia Patient is coming in for recheck of his hyperlipidemia. The patient is currently taking fish oil and Crestor. They deny any issues with myalgias or history of liver damage from it. They deny any focal numbness or weakness or chest pain.   Patient is coming in for recheck for migraines. She currently takes Cymbalta and Topamax for prevention and Maxalt for abortive and she says they are working okay but she is starting to have more migraines again.  She feels like she gets multiple migraines a month and that it is just not working well for prevention, we discussed the new injectables and she would like to go ahead and try 1.  Relevant past medical, surgical, family and social history reviewed and updated as indicated. Interim medical history since our last visit reviewed. Allergies and medications reviewed and updated.  Review of Systems  Constitutional: Negative for chills and fever.  Eyes: Negative for redness and visual disturbance.  Respiratory: Negative for chest tightness and shortness of breath.   Cardiovascular: Negative for chest pain and leg swelling.  Genitourinary: Negative for difficulty urinating and dysuria.  Musculoskeletal: Negative for back pain and gait problem.  Skin: Negative for rash.  Neurological: Positive for headaches. Negative for light-headedness.  Psychiatric/Behavioral: Negative for agitation and behavioral problems.  All other systems  reviewed and are negative.   Per HPI unless specifically indicated above   Allergies as of 01/07/2020   No Known Allergies     Medication List       Accurate as of January 07, 2020  9:38 AM. If you have any questions, ask your nurse or doctor.        STOP taking these medications   CITRACAL SLOW RELEASE PO Stopped by: Fransisca Kaufmann Maysen Sudol, MD   mirtazapine 30 MG tablet Commonly known as: Remeron Stopped by: Worthy Rancher, MD   raloxifene 60 MG tablet Commonly known as: Evista Stopped by: Worthy Rancher, MD   Vitamin D3 50 MCG (2000 UT) capsule Stopped by: Fransisca Kaufmann Niklaus Mamaril, MD     TAKE these medications   Biotin 1 MG Caps Take by mouth.   Claritin 10 MG Caps Generic drug: Loratadine Take by mouth.   DULoxetine 60 MG capsule Commonly known as: CYMBALTA Take 1 capsule (60 mg total) by mouth 2 (two) times daily.   esomeprazole 40 MG capsule Commonly known as: NEXIUM Take 1 capsule (40 mg total) by mouth daily at 12 noon.   estradiol 0.1 MG/GM vaginal cream Commonly known as: ESTRACE VAGINAL Place 1 Applicatorful vaginally at bedtime.   fish oil-omega-3 fatty acids 1000 MG capsule Take 2 g by mouth 2 (two) times daily.   promethazine 25 MG tablet Commonly known as: PHENERGAN Take 1 tablet (25 mg total) by mouth every 6 (six) hours as needed for nausea or vomiting.  rizatriptan 10 MG disintegrating tablet Commonly known as: Maxalt-MLT Take 1 tablet (10 mg total) by mouth as needed for migraine. May repeat in 2 hours if needed   rosuvastatin 20 MG tablet Commonly known as: Crestor Take 1 tablet (20 mg total) by mouth daily.   topiramate 100 MG tablet Commonly known as: TOPAMAX Take 1 tablet (100 mg total) by mouth daily.        Objective:   Ht _0  (1.651 m)   Wt 161 lb (73 kg)   BMI 26.79 kg/m   Wt Readings from Last 3 Encounters:  01/07/20 161 lb (73 kg)  02/26/19 169 lb (76.7 kg)  04/12/18 151 lb 3.2 oz (68.6 kg)     Physical Exam Vitals and nursing note reviewed.  Constitutional:      General: She is not in acute distress.    Appearance: She is well-developed. She is not diaphoretic.  Eyes:     Conjunctiva/sclera: Conjunctivae normal.  Cardiovascular:     Rate and Rhythm: Normal rate and regular rhythm.     Heart sounds: Normal heart sounds. No murmur heard.   Pulmonary:     Effort: Pulmonary effort is normal. No respiratory distress.     Breath sounds: Normal breath sounds. No wheezing.  Musculoskeletal:        General: No tenderness. Normal range of motion.  Skin:    General: Skin is warm and dry.     Findings: No rash.  Neurological:     Mental Status: She is alert and oriented to person, place, and time.     Coordination: Coordination normal.  Psychiatric:        Behavior: Behavior normal.       Assessment & Plan:   Problem List Items Addressed This Visit      Cardiovascular and Mediastinum   Migraines - Primary   Relevant Medications   DULoxetine (CYMBALTA) 60 MG capsule   rizatriptan (MAXALT-MLT) 10 MG disintegrating tablet   rosuvastatin (CRESTOR) 20 MG tablet   topiramate (TOPAMAX) 100 MG tablet   Fremanezumab-vfrm (AJOVY) 225 MG/1.5ML SOAJ   Other Relevant Orders   CBC with Differential/Platelet   TSH     Digestive   GERD (gastroesophageal reflux disease)   Relevant Medications   esomeprazole (NEXIUM) 40 MG capsule   Other Relevant Orders   CBC with Differential/Platelet   TSH     Other   Hyperlipidemia with target LDL less than 100   Relevant Medications   rosuvastatin (CRESTOR) 20 MG tablet   Other Relevant Orders   CMP14+EGFR   Lipid panel    Will try and start Ajovy, when she starts Ajovy then she can taper off the Topamax, gave her instructions for that.  Sent the prescription to see if it is covered.  Follow up plan: Return in about 6 months (around 07/06/2020), or if symptoms worsen or fail to improve, for Migraines and hyperlipidemia and  GERD.  Counseling provided for all of the vaccine components No orders of the defined types were placed in this encounter.   Caryl Pina, MD [facility] 01/07/2020, 9:38 AM

## 2020-01-08 LAB — CMP14+EGFR
ALT: 32 IU/L (ref 0–32)
AST: 33 IU/L (ref 0–40)
Albumin/Globulin Ratio: 1.4 (ref 1.2–2.2)
Albumin: 4.4 g/dL (ref 3.8–4.8)
Alkaline Phosphatase: 59 IU/L (ref 44–121)
BUN/Creatinine Ratio: 13 (ref 12–28)
BUN: 11 mg/dL (ref 8–27)
Bilirubin Total: 0.3 mg/dL (ref 0.0–1.2)
CO2: 27 mmol/L (ref 20–29)
Calcium: 9.9 mg/dL (ref 8.7–10.3)
Chloride: 102 mmol/L (ref 96–106)
Creatinine, Ser: 0.82 mg/dL (ref 0.57–1.00)
GFR calc Af Amer: 85 mL/min/{1.73_m2} (ref >59)
GFR calc non Af Amer: 74 mL/min/{1.73_m2} (ref >59)
Globulin, Total: 3.1 g/dL (ref 1.5–4.5)
Glucose: 101 mg/dL — ABNORMAL HIGH (ref 65–99)
Potassium: 4.9 mmol/L (ref 3.5–5.2)
Sodium: 142 mmol/L (ref 134–144)
Total Protein: 7.5 g/dL (ref 6.0–8.5)

## 2020-01-08 LAB — LIPID PANEL
Chol/HDL Ratio: 2.3 ratio (ref 0.0–4.4)
Cholesterol, Total: 140 mg/dL (ref 100–199)
HDL: 61 mg/dL (ref >39)
LDL Chol Calc (NIH): 62 mg/dL (ref 0–99)
Triglycerides: 90 mg/dL (ref 0–149)
VLDL Cholesterol Cal: 17 mg/dL (ref 5–40)

## 2020-01-08 LAB — CBC WITH DIFFERENTIAL/PLATELET
Basophils Absolute: 0.1 10*3/uL (ref 0.0–0.2)
Basos: 1 %
EOS (ABSOLUTE): 0.2 10*3/uL (ref 0.0–0.4)
Eos: 3 %
Hematocrit: 42 % (ref 34.0–46.6)
Hemoglobin: 13.8 g/dL (ref 11.1–15.9)
Immature Grans (Abs): 0 10*3/uL (ref 0.0–0.1)
Immature Granulocytes: 0 %
Lymphocytes Absolute: 2.3 10*3/uL (ref 0.7–3.1)
Lymphs: 31 %
MCH: 30.7 pg (ref 26.6–33.0)
MCHC: 32.9 g/dL (ref 31.5–35.7)
MCV: 94 fL (ref 79–97)
Monocytes Absolute: 0.6 10*3/uL (ref 0.1–0.9)
Monocytes: 8 %
Neutrophils Absolute: 4.3 10*3/uL (ref 1.4–7.0)
Neutrophils: 57 %
Platelets: 262 10*3/uL (ref 150–450)
RBC: 4.49 x10E6/uL (ref 3.77–5.28)
RDW: 11.8 % (ref 11.7–15.4)
WBC: 7.5 10*3/uL (ref 3.4–10.8)

## 2020-01-08 LAB — TSH: TSH: 2.31 u[IU]/mL (ref 0.450–4.500)

## 2020-02-17 IMAGING — DX DG CERVICAL SPINE COMPLETE 4+V
5 series · 5 of 5 positions shown · non-contrast
Comparison: None.

CLINICAL DATA: Neck and right upper extremity pain. No reported
injury.

EXAM:
CERVICAL SPINE - COMPLETE 4+ VIEW

[c-spine lat]
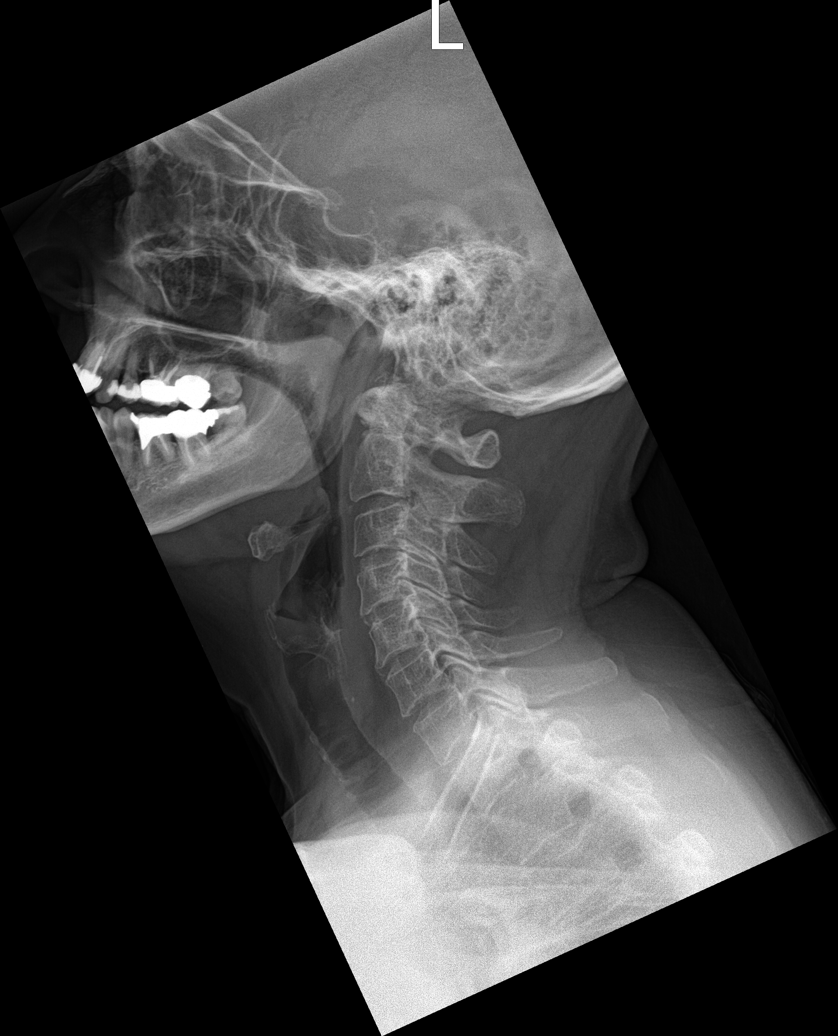

[c-spine obl (1 of 2)]
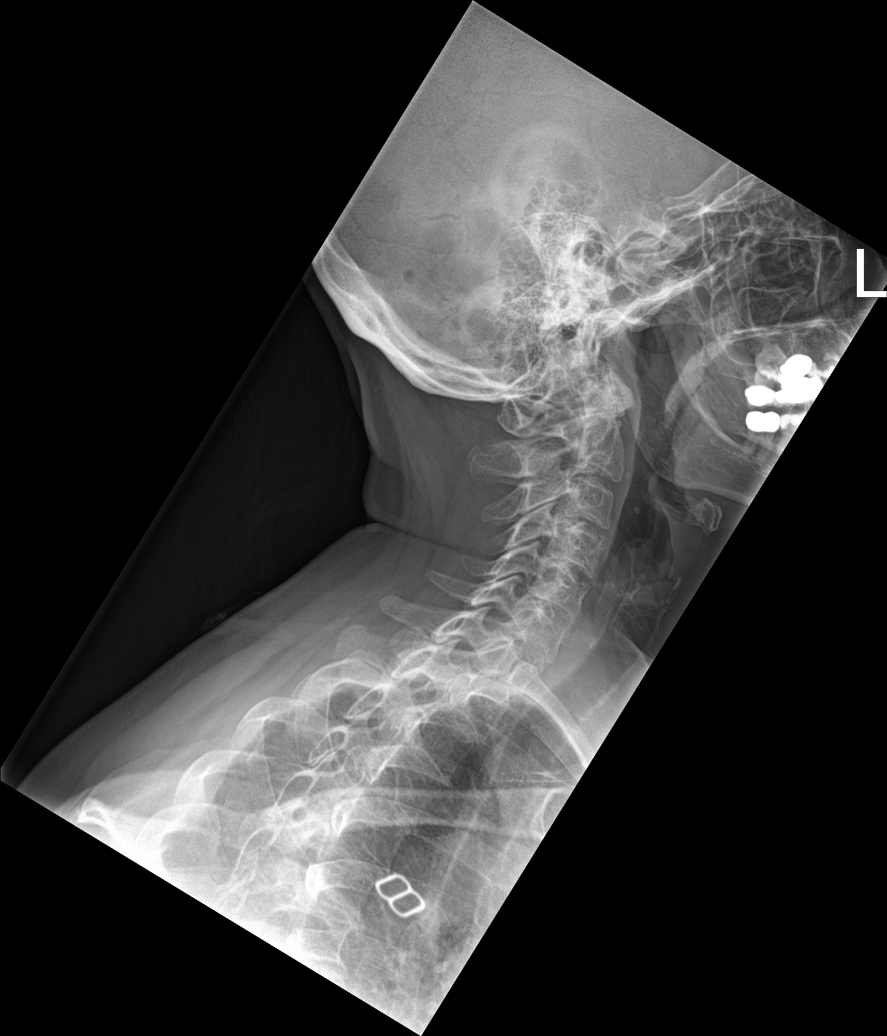

[c-spine obl (2 of 2)]
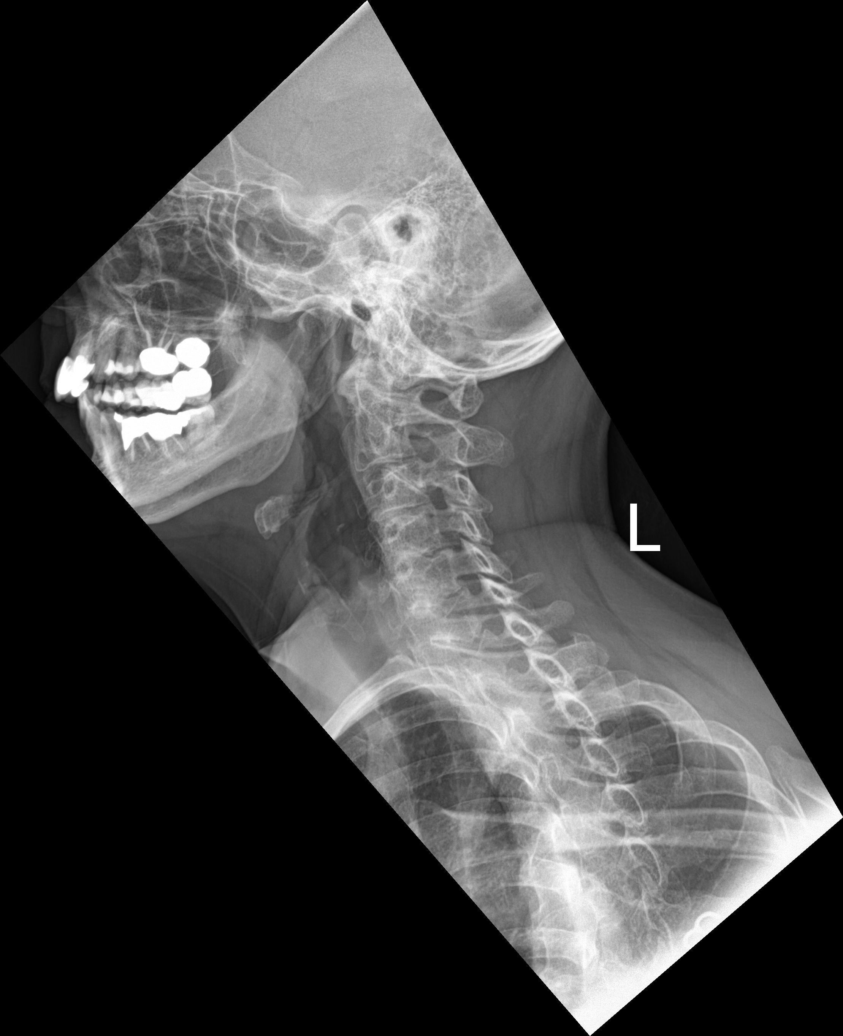

[c-spine ap (1 of 2)]
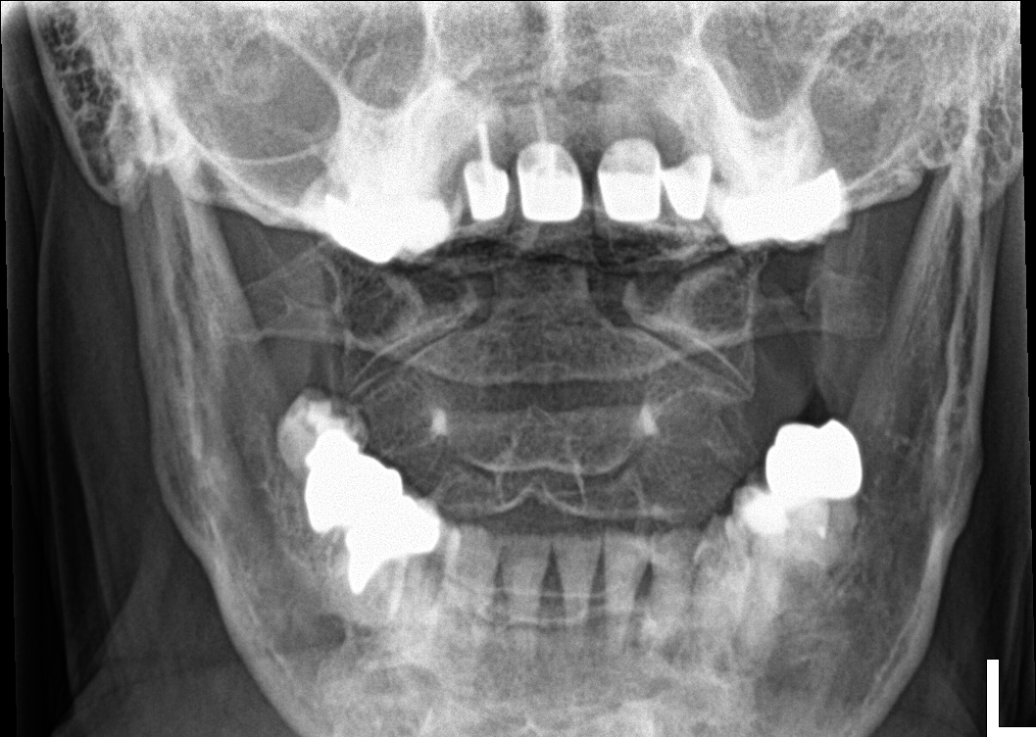

[c-spine ap (2 of 2)]
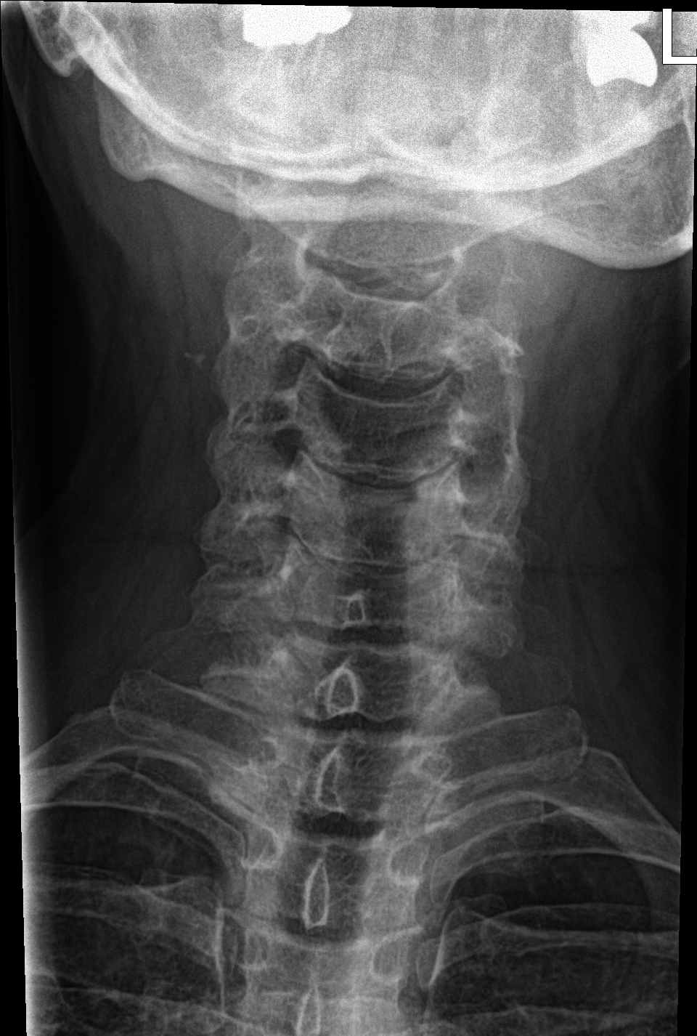

[5 of 5 positions shown; findings below may reference images not displayed]

FINDINGS: On the lateral view the cervical spine is visualized to the level of
C7-T1. There is a normal cervical lordosis. Pre-vertebral soft
tissues are within normal limits. No fracture is detected in the
cervical spine. Dens is well positioned between the lateral masses
of C1. Mild degenerative disc disease at C4-5 and C5-6. No
subluxation. Minimal bilateral facet arthropathy. Suggestion of mild
degenerative foraminal stenosis on the right at C4-5. No
aggressive-appearing focal osseous lesions.
IMPRESSION: 1. Mild degenerative disc disease at C4-5 and C5-6.
2. Minimal bilateral cervical facet arthropathy.
3. Mild degenerative foraminal stenosis on the right at C4-5.

## 2020-02-17 IMAGING — DX DG SHOULDER 2+V*R*
3 series · 3 of 3 positions shown · non-contrast
Comparison: None.

CLINICAL DATA: Right upper extremity pain.  No reported injury.

EXAM:
RIGHT SHOULDER - 2+ VIEW

[shoulder ap]
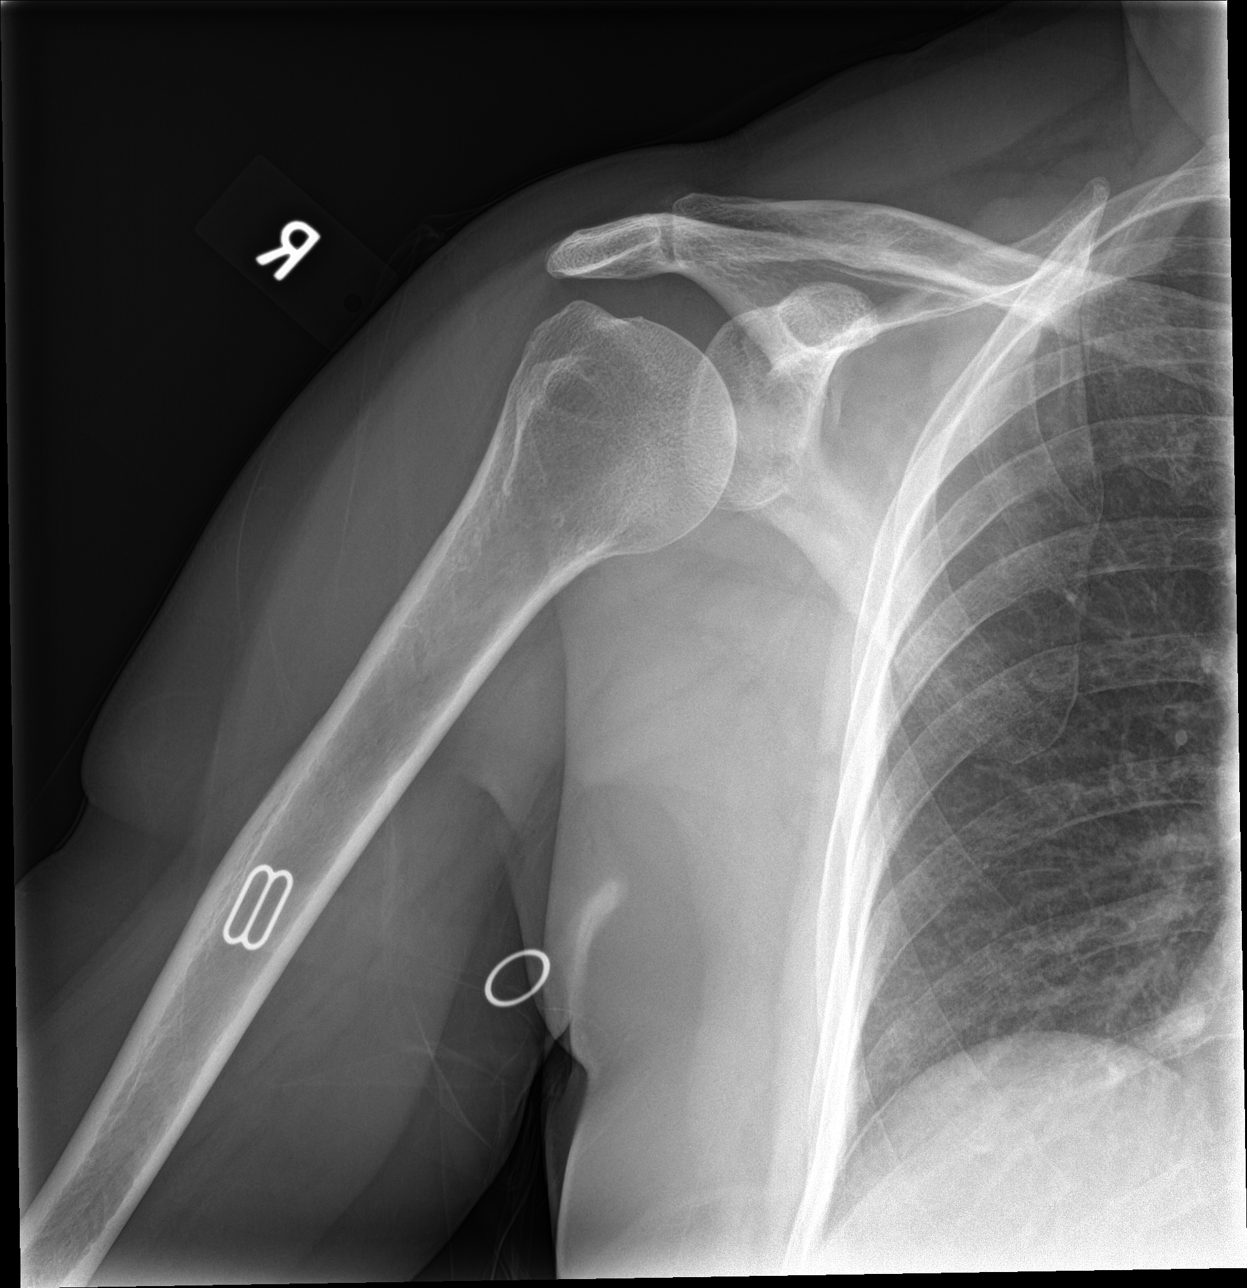

[shoulder obl]
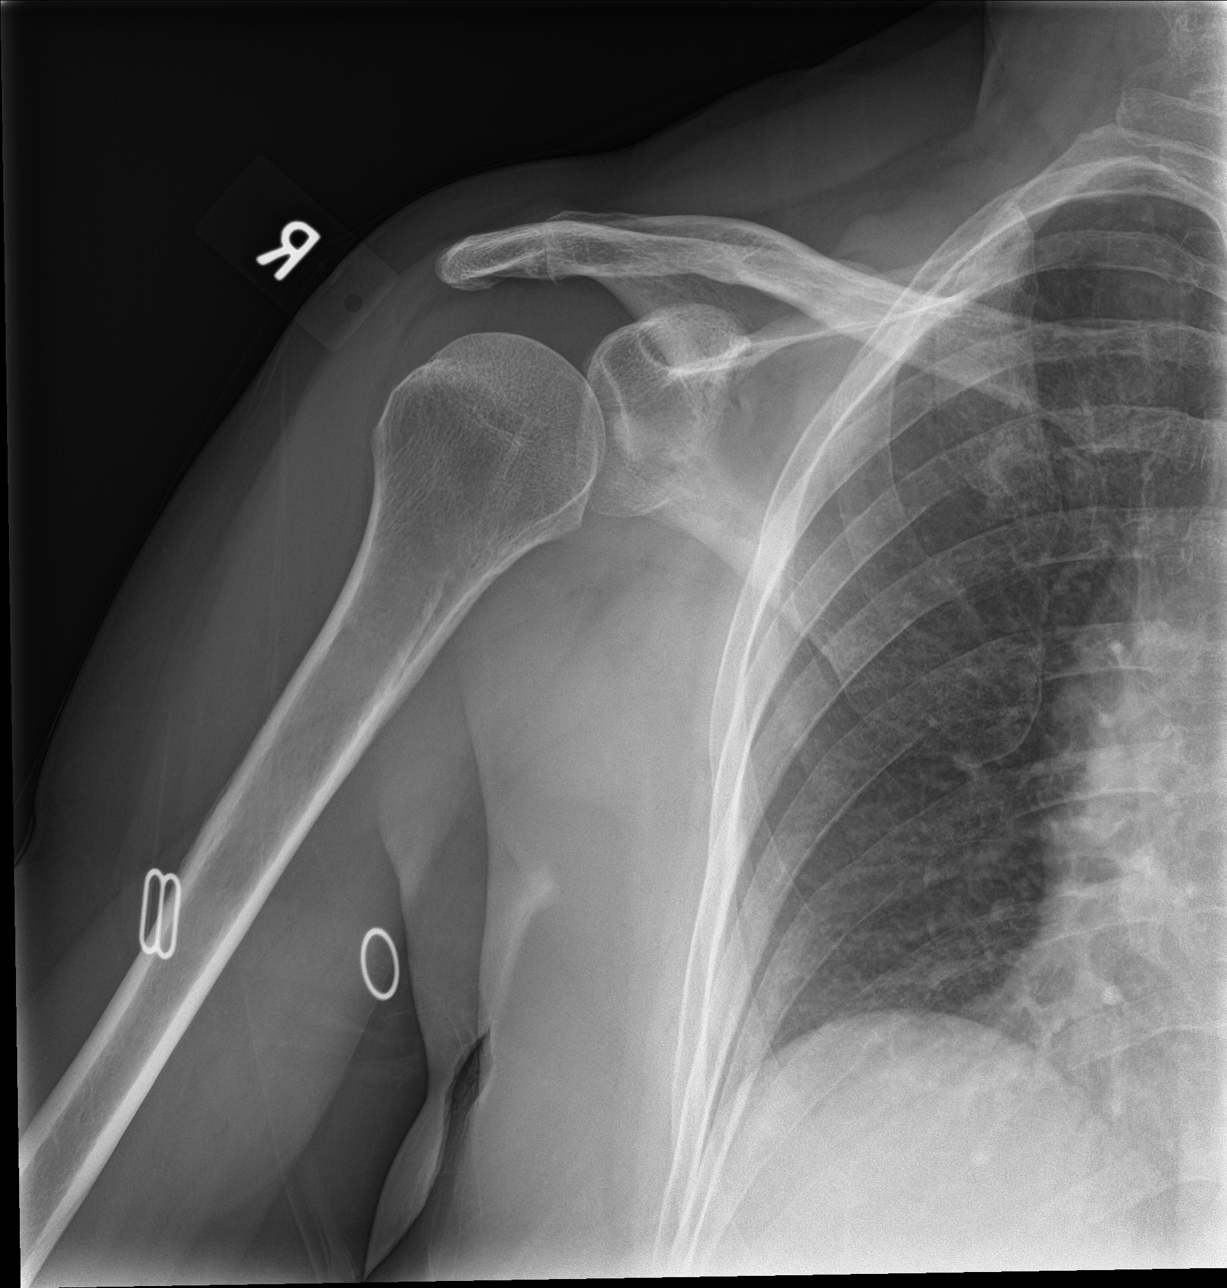

[shoulder axial]
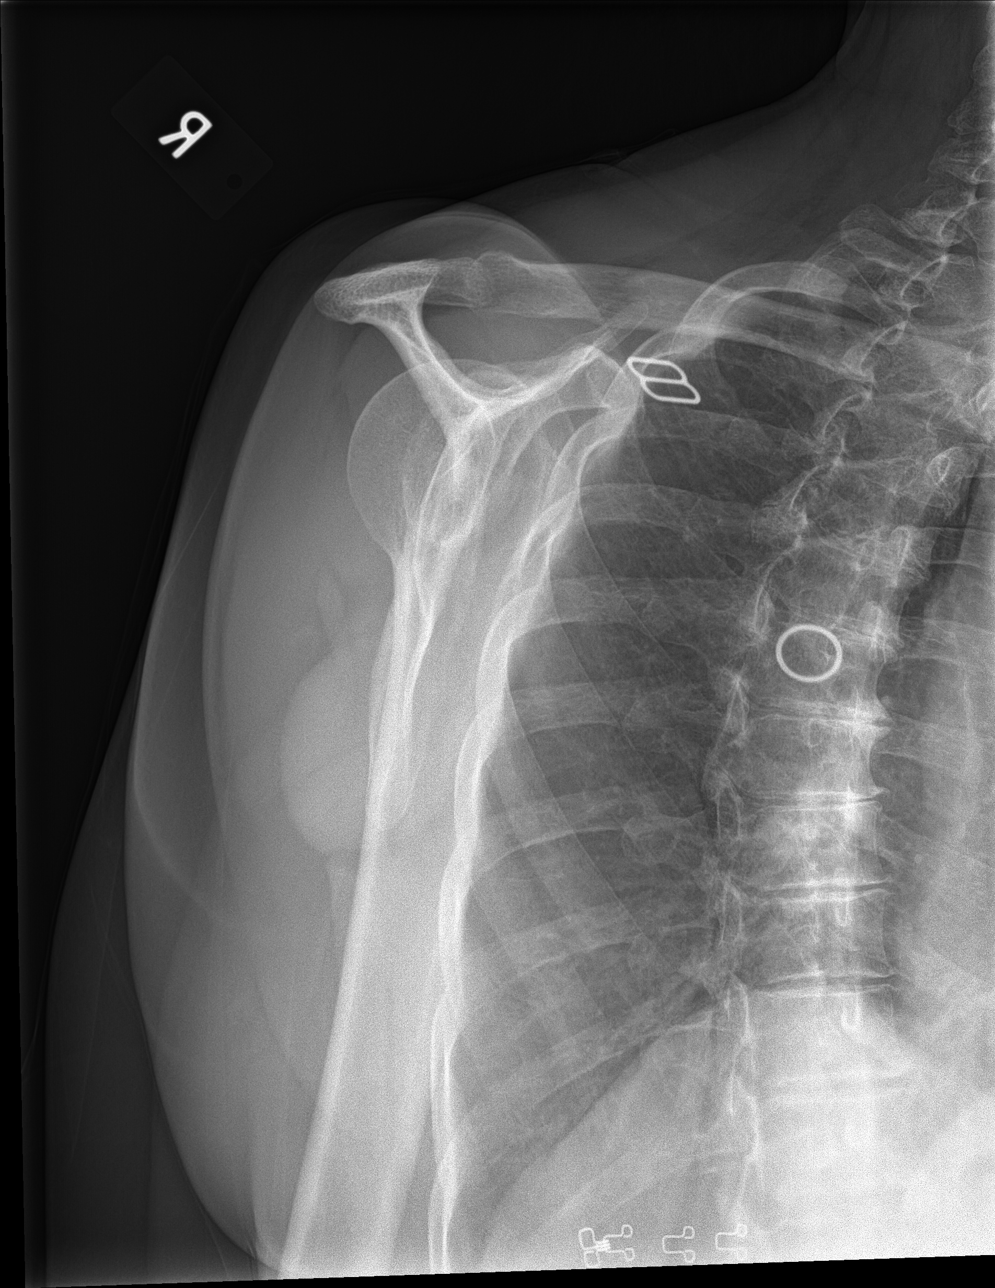

[3 of 3 positions shown; findings below may reference images not displayed]

FINDINGS: There is no evidence of fracture or dislocation. There is no
evidence of arthropathy or other focal bone abnormality. Soft
tissues are unremarkable.
IMPRESSION: No acute osseous abnormality.  No significant arthropathy.

## 2020-03-08 DIAGNOSIS — M19072 Primary osteoarthritis, left ankle and foot: Secondary | ICD-10-CM | POA: Diagnosis not present

## 2020-03-08 DIAGNOSIS — M25572 Pain in left ankle and joints of left foot: Secondary | ICD-10-CM | POA: Diagnosis not present

## 2020-03-08 DIAGNOSIS — M19079 Primary osteoarthritis, unspecified ankle and foot: Secondary | ICD-10-CM | POA: Insufficient documentation

## 2020-03-08 DIAGNOSIS — M79672 Pain in left foot: Secondary | ICD-10-CM | POA: Diagnosis not present

## 2020-06-02 DIAGNOSIS — M79671 Pain in right foot: Secondary | ICD-10-CM | POA: Diagnosis not present

## 2020-06-02 DIAGNOSIS — M25571 Pain in right ankle and joints of right foot: Secondary | ICD-10-CM | POA: Diagnosis not present

## 2020-06-02 DIAGNOSIS — M19071 Primary osteoarthritis, right ankle and foot: Secondary | ICD-10-CM | POA: Diagnosis not present

## 2020-06-02 DIAGNOSIS — M19072 Primary osteoarthritis, left ankle and foot: Secondary | ICD-10-CM | POA: Diagnosis not present

## 2020-06-15 DIAGNOSIS — M19071 Primary osteoarthritis, right ankle and foot: Secondary | ICD-10-CM | POA: Diagnosis not present

## 2020-06-15 DIAGNOSIS — M19072 Primary osteoarthritis, left ankle and foot: Secondary | ICD-10-CM | POA: Diagnosis not present

## 2020-07-07 ENCOUNTER — Other Ambulatory Visit: Payer: Self-pay | Admitting: Family Medicine

## 2020-07-07 DIAGNOSIS — Z1231 Encounter for screening mammogram for malignant neoplasm of breast: Secondary | ICD-10-CM

## 2020-08-16 DIAGNOSIS — H10231 Serous conjunctivitis, except viral, right eye: Secondary | ICD-10-CM | POA: Diagnosis not present

## 2020-08-17 DIAGNOSIS — H10013 Acute follicular conjunctivitis, bilateral: Secondary | ICD-10-CM | POA: Diagnosis not present

## 2020-08-24 DIAGNOSIS — L578 Other skin changes due to chronic exposure to nonionizing radiation: Secondary | ICD-10-CM | POA: Diagnosis not present

## 2020-08-24 DIAGNOSIS — L821 Other seborrheic keratosis: Secondary | ICD-10-CM | POA: Diagnosis not present

## 2020-08-24 DIAGNOSIS — D1801 Hemangioma of skin and subcutaneous tissue: Secondary | ICD-10-CM | POA: Diagnosis not present

## 2020-08-24 DIAGNOSIS — L570 Actinic keratosis: Secondary | ICD-10-CM | POA: Diagnosis not present

## 2020-08-24 DIAGNOSIS — L814 Other melanin hyperpigmentation: Secondary | ICD-10-CM | POA: Diagnosis not present

## 2020-08-25 DIAGNOSIS — M19072 Primary osteoarthritis, left ankle and foot: Secondary | ICD-10-CM | POA: Diagnosis not present

## 2020-08-25 DIAGNOSIS — M13871 Other specified arthritis, right ankle and foot: Secondary | ICD-10-CM | POA: Diagnosis not present

## 2020-08-25 DIAGNOSIS — M19071 Primary osteoarthritis, right ankle and foot: Secondary | ICD-10-CM | POA: Diagnosis not present

## 2020-08-26 DIAGNOSIS — H10013 Acute follicular conjunctivitis, bilateral: Secondary | ICD-10-CM | POA: Diagnosis not present

## 2020-09-03 ENCOUNTER — Other Ambulatory Visit: Payer: Self-pay

## 2020-09-03 ENCOUNTER — Ambulatory Visit
Admission: RE | Admit: 2020-09-03 | Discharge: 2020-09-03 | Disposition: A | Discharge status: Home / Self Care | Payer: Medicare Other | Source: Ambulatory Visit | Attending: Family Medicine | Admitting: Family Medicine

## 2020-09-03 DIAGNOSIS — Z1231 Encounter for screening mammogram for malignant neoplasm of breast: Secondary | ICD-10-CM | POA: Diagnosis not present

## 2020-09-10 ENCOUNTER — Ambulatory Visit: Payer: Medicare Other

## 2020-10-27 DIAGNOSIS — M19071 Primary osteoarthritis, right ankle and foot: Secondary | ICD-10-CM | POA: Diagnosis not present

## 2020-10-27 DIAGNOSIS — G7249 Other inflammatory and immune myopathies, not elsewhere classified: Secondary | ICD-10-CM | POA: Diagnosis not present

## 2020-10-27 DIAGNOSIS — M79641 Pain in right hand: Secondary | ICD-10-CM | POA: Diagnosis not present

## 2020-10-27 DIAGNOSIS — M19072 Primary osteoarthritis, left ankle and foot: Secondary | ICD-10-CM | POA: Diagnosis not present

## 2020-11-09 ENCOUNTER — Telehealth: Payer: Self-pay | Admitting: Family Medicine

## 2020-11-09 NOTE — Telephone Encounter (Signed)
Left message for patient to call back and schedule Medicare Annual Wellness Visit (AWV) by video or phone.  Last AWV 10/23/2018  Please schedule at any time with [facility] Health Advisor.  45-minute appointment  Any questions, please contact me at [phone]

## 2020-11-11 ENCOUNTER — Ambulatory Visit (INDEPENDENT_AMBULATORY_CARE_PROVIDER_SITE_OTHER): Payer: Medicare Other

## 2020-11-11 VITALS — Ht 65.0 in | Wt 160.0 lb

## 2020-11-11 DIAGNOSIS — Z0000 Encounter for general adult medical examination without abnormal findings: Secondary | ICD-10-CM | POA: Diagnosis not present

## 2020-11-11 NOTE — Progress Notes (Signed)
Subjective:   Selena Barnes is a 70 y.o. female who presents for Medicare Annual (Subsequent) preventive examination.  Virtual Visit via Telephone Note  I connected with  Selena Barnes on 11/11/20 at  9:00 AM EDT by telephone and verified that I am speaking with the correct person using two identifiers.  Location: Patient: Home Provider: [facility] Persons participating in the virtual visit: patient/Nurse Health Advisor   I discussed the limitations, risks, security and privacy concerns of performing an evaluation and management service by telephone and the availability of in person appointments. The patient expressed understanding and agreed to proceed.  Interactive audio and video telecommunications were attempted between this nurse and patient, however failed, due to patient having technical difficulties OR patient did not have access to video capability.  We continued and completed visit with audio only.  Some vital signs may be absent or patient reported.   Gillian Meeuwsen E Tasmin Exantus, LPN   Review of Systems     Cardiac Risk Factors include: advanced age (>47men, >22 women);dyslipidemia;sedentary lifestyle     Objective:    Today's Vitals   11/11/20 0903  Weight: 160 lb (72.6 kg)  Height: 5\' 5"  (1.651 m)   Body mass index is 26.63 kg/m.  Advanced Directives 11/11/2020 09/16/2019 10/23/2018 01/23/2018 11/27/2016 11/18/2015 11/10/2015  Does Patient Have a Medical Advance Directive? Yes Yes Yes Yes Yes Yes Yes  Type of Paramedic of [place];Living will - [place];Living will - [place];Living will [place];Living will [place];Living will  Does patient want to make changes to medical advance directive? - - No - Patient declined - No - Patient declined No - Patient declined -  Copy of [place] in Chart? No - copy requested - No - copy requested - Yes Yes Yes  Would patient  like information on creating a medical advance directive? - - - - - - -    Current Medications (verified) Outpatient Encounter Medications as of 11/11/2020  Medication Sig   Biotin 1 MG CAPS Take by mouth.   Calcium Carb-Cholecalciferol 600-800 MG-UNIT TABS Caltrate with Vitamin D3 600 mg-20 mcg (800 unit) tablet   Coenzyme Q10 100 MG capsule CoQ-10 100 mg capsule   DULoxetine (CYMBALTA) 60 MG capsule Take 1 capsule (60 mg total) by mouth 2 (two) times daily.   esomeprazole (NEXIUM) 40 MG capsule Take 1 capsule (40 mg total) by mouth daily at 12 noon.   estradiol (ESTRACE VAGINAL) 0.1 MG/GM vaginal cream Place 1 Applicatorful vaginally at bedtime.   fish oil-omega-3 fatty acids 1000 MG capsule Take 2 g by mouth 2 (two) times daily.    Fremanezumab-vfrm (AJOVY) 225 MG/1.5ML SOAJ Inject 225 mg into the skin every 30 (thirty) days.   Loratadine 10 MG CAPS Take by mouth.   promethazine (PHENERGAN) 25 MG tablet Take 1 tablet (25 mg total) by mouth every 6 (six) hours as needed for nausea or vomiting.   rizatriptan (MAXALT-MLT) 10 MG disintegrating tablet Take 1 tablet (10 mg total) by mouth as needed for migraine. May repeat in 2 hours if needed   rosuvastatin (CRESTOR) 20 MG tablet Take 1 tablet (20 mg total) by mouth daily.   [DISCONTINUED] topiramate (TOPAMAX) 100 MG tablet Take 1 tablet (100 mg total) by mouth daily. (Patient not taking: Reported on 11/11/2020)   Facility-Administered Encounter Medications as of 11/11/2020  Medication   meloxicam (MOBIC) tablet 15 mg   methylPREDNISolone  acetate (DEPO-MEDROL) injection 40 mg    Allergies (verified) Sulfa antibiotics and Sulfacetamide   History: Past Medical History:  Diagnosis Date   Arthritis    Constipation    Depression    GERD (gastroesophageal reflux disease)    Hyperlipidemia    Insomnia    Migraines    Osteopenia    Past Surgical History:  Procedure Laterality Date   ABDOMINAL HYSTERECTOMY     ANAL RECTAL MANOMETRY N/A  10/06/2015   Procedure: ANO RECTAL MANOMETRY;  Surgeon: Gatha Mayer, MD;  Location: [facility];  Service: Endoscopy;  Laterality: N/A;   BREAST BIOPSY Right    BREAST EXCISIONAL BIOPSY Right    benign   BREAST EXCISIONAL BIOPSY Left 2000   axillary area. lymph node biopsy- benign   CATARACT EXTRACTION W/ INTRAOCULAR LENS IMPLANT Bilateral 07/27/2016   and 09/27/2016, [facility] in [place], [place]     knee surgery Left 2019   arthroscopic for meniscus tear   LYMPH NODE BIOPSY Left    left arm   Family History  Problem Relation Age of Onset   Heart disease Mother 74       cause of death   Heart disease Father 40       death   Kidney disease Father    Diabetes Brother    Cancer Brother 60       bone marrow cancer   Diabetes Grandchild        grandson   Social History   Socioeconomic History   Marital status: Married    Spouse name: Laveda Abbe   Number of children: 1   Years of education: 12   Highest education level: High school graduate  Occupational History   Occupation: retired  Tobacco Use   Smoking status: Never   Smokeless tobacco: Never  Scientific laboratory technician Use: Never used  Substance and Sexual Activity   Alcohol use: No    Alcohol/week: 0.0 standard drinks   Drug use: No   Sexual activity: Yes  Other Topics Concern   Not on file  Social History Narrative   Lives at home one level with husband Laveda Abbe   Right handed   Caffeine: sometimes a coke zero   Son lives one mile away   Social Determinants of Health   Financial Resource Strain: Low Risk    Difficulty of Paying Living Expenses: Not hard at all  Food Insecurity: No Food Insecurity   Worried About Charity fundraiser in the Last Year: Never true   Arboriculturist in the Last Year: Never true  Transportation Needs: No Transportation Needs   Lack of Transportation (Medical): No   Lack of Transportation (Non-Medical): No  Physical  Activity: Insufficiently Active   Days of Exercise per Week: 2 days   Minutes of Exercise per Session: 40 min  Stress: No Stress Concern Present   Feeling of Stress : Only a little  Social Connections: Engineer, building services of Communication with Friends and Family: More than three times a week   Frequency of Social Gatherings with Friends and Family: More than three times a week   Attends Religious Services: More than 4 times per year   Active Member of [employer] or Organizations: Yes   Attends Archivist Meetings: 1 to 4 times per year   Marital Status: Married    Tobacco Counseling Counseling given: Not Answered  Clinical Intake:  Pre-visit preparation completed: Yes  Pain : No/denies pain     BMI - recorded: 26.63 Nutritional Status: BMI 25 -29 Overweight Nutritional Risks: None Diabetes: No  How often do you need to have someone help you when you read instructions, pamphlets, or other written materials from your doctor or pharmacy?: 1 - Never  Diabetic? No  Interpreter Needed?: No  Information entered by :: Johnatan Baskette, LPN   Activities of Daily Living In your present state of health, do you have any difficulty performing the following activities: 11/11/2020  Hearing? N  Vision? N  Difficulty concentrating or making decisions? N  Walking or climbing stairs? Y  Comment walks sideways and hold on- hurts knee  Dressing or bathing? N  Doing errands, shopping? N  Preparing Food and eating ? N  Using the Toilet? N  In the past six months, have you accidently leaked urine? N  Do you have problems with loss of bowel control? N  Managing your Medications? N  Managing your Finances? N  Housekeeping or managing your Housekeeping? N  Some recent data might be hidden    Patient Care Team: Dettinger, Fransisca Kaufmann, MD as PCP - General (Family Medicine) Gatha Mayer, MD as Consulting Physician (Gastroenterology) Ilean China, RN as Case  Manager  Indicate any recent Medical Services you may have received from other than [facility] providers in the past year (date may be approximate).     Assessment:   This is a routine wellness examination for Selena Barnes.  Hearing/Vision screen Hearing Screening - Comments:: Denies hearing difficulties  Vision Screening - Comments:: Wears reading glasses prn only - up to date with annual eye exams at [facility] issues and exercise activities discussed: Current Exercise Habits: Home exercise routine, Type of exercise: strength training/weights;walking, Time (Minutes): 45, Frequency (Times/Week): 2, Weekly Exercise (Minutes/Week): 90, Intensity: Moderate, Exercise limited by: orthopedic condition(s)   Goals Addressed             This Visit's Progress    Exercise 150 minutes per week (moderate activity)   Not on track      Depression Screen PHQ 2/9 Scores 11/11/2020 01/07/2020 10/23/2018 04/12/2018 10/12/2017 10/10/2017 05/03/2017  PHQ - 2 Score 0 1 0 0 0 2 1  PHQ- 9 Score - - - - - 14 -    Fall Risk Fall Risk  11/11/2020 01/07/2020 10/23/2018 04/12/2018 10/12/2017  Falls in the past year? 0 0 0 0 No  Number falls in past yr: 0 - 0 - -  Injury with Fall? 0 - 0 - -  Risk for fall due to : Orthopedic patient - - - -  Follow up Falls prevention discussed - Falls prevention discussed - -  Comment - - get rid of all throw rugs in the house, adequate lighting in the walkways and grab bars in the bathroom - -    FALL RISK PREVENTION PERTAINING TO THE HOME:  Any stairs in or around the home? Yes  If so, are there any without handrails?  3 steps coming into house - no hand rail - advised to have one installed for safety Home free of loose throw rugs in walkways, pet beds, electrical cords, etc? Yes  Adequate lighting in your home to reduce risk of falls? Yes   ASSISTIVE DEVICES UTILIZED TO PREVENT FALLS:  Life alert? No  Use of a cane, walker or w/c? No  Grab bars in the bathroom?  No  Shower chair or bench in shower? No  Elevated toilet seat or a handicapped toilet? Yes   TIMED UP AND GO:  Was the test performed? No . Telephonic visit  Cognitive Function: Normal cognitive status assessed by direct observation by this Nurse Health Advisor. No abnormalities found.   MMSE - Mini Mental State Exam 11/14/2016 11/10/2015 11/03/2014  Orientation to time 5 5 5   Orientation to Place 4 5 5   Registration 3 3 3   Attention/ Calculation 5 5 5   Recall 3 3 3   Language- name 2 objects 2 2 2   Language- repeat 1 1 1   Language- follow 3 step command 3 3 3   Language- read & follow direction 1 1 1   Write a sentence 1 1 1   Copy design 1 1 1   Total score 29 30 30      6CIT Screen 10/23/2018  What Year? 0 points  What month? 0 points  What time? 0 points  Count back from 20 0 points  Months in reverse 0 points  Repeat phrase 0 points  Total Score 0    Immunizations Immunization History  Administered Date(s) Administered   Fluad Quad(high Dose 65+) 11/06/2018, 09/25/2019, 12/17/2019   Influenza, High Dose Seasonal PF 11/14/2016, 01/18/2018   Influenza,inj,Quad PF,6+ Mos 12/20/2012, 01/22/2014, 11/13/2014, 11/08/2015   Moderna Sars-Covid-2 Vaccination 03/20/2019, 04/18/2019, 12/10/2019   PFIZER SARS-COV-2 Pediatric Vaccination 5-75yrs 04/30/2019   Pneumococcal Conjugate-13 10/11/2016   Pneumococcal Polysaccharide-23 01/18/2018   Tdap 11/10/2015   Zoster, Live 06/20/2013    TDAP status: Up to date  Flu Vaccine status: Due, Education has been provided regarding the importance of this vaccine. Advised may receive this vaccine at local pharmacy or Health Dept. Aware to provide a copy of the vaccination record if obtained from local pharmacy or Health Dept. Verbalized acceptance and understanding.  Pneumococcal vaccine status: Up to date  Covid-19 vaccine status: Completed vaccines  Qualifies for Shingles Vaccine? Yes   Zostavax completed Yes   Shingrix Completed?: No.     Education has been provided regarding the importance of this vaccine. Patient has been advised to call insurance company to determine out of pocket expense if they have not yet received this vaccine. Advised may also receive vaccine at local pharmacy or Health Dept. Verbalized acceptance and understanding.  Screening Tests Health Maintenance  Topic Date Due   Zoster Vaccines- Shingrix (1 of 2) Never done   COVID-19 Vaccine (4 - Booster for Moderna series) 04/11/2020   INFLUENZA VACCINE  09/13/2020   MAMMOGRAM  09/04/2022   COLONOSCOPY (Pts 45-93yrs Insurance coverage will need to be confirmed)  07/26/2025   TETANUS/TDAP  11/09/2025   DEXA SCAN  Completed   Hepatitis C Screening  Completed   HPV VACCINES  Aged Out    Health Maintenance  Health Maintenance Due  Topic Date Due   Zoster Vaccines- Shingrix (1 of 2) Never done   COVID-19 Vaccine (4 - Booster for Moderna series) 04/11/2020   INFLUENZA VACCINE  09/13/2020    Colorectal cancer screening: Type of screening: Colonoscopy. Completed 07/27/2015. Repeat every 10 years  Mammogram status: Completed 09/03/2020. Repeat every year  Bone Density status: Completed 04/12/2018. Results reflect: Bone density results: OSTEOPOROSIS. Repeat every 2 years.  Lung Cancer Screening: (Low Dose CT Chest recommended if Age 7-80 years, 30 pack-year currently smoking OR have quit w/in 15years.) does not qualify.   Additional Screening:  Hepatitis C Screening: does qualify; Completed 10/11/2016  Vision Screening: Recommended annual ophthalmology exams for early detection of  glaucoma and other disorders of the eye. Is the patient up to date with their annual eye exam?  Yes  Who is the provider or what is the name of the office in which the patient attends annual eye exams? [facility] If pt is not established with a provider, would they like to be referred to a provider to establish care? No .   Dental Screening: Recommended annual dental  exams for proper oral hygiene  Community Resource Referral / Chronic Care Management: CRR required this visit?  No   CCM required this visit?  No      Plan:     I have personally reviewed and noted the following in the patient's chart:   Medical and social history Use of alcohol, tobacco or illicit drugs  Current medications and supplements including opioid prescriptions.  Functional ability and status Nutritional status Physical activity Advanced directives List of other physicians Hospitalizations, surgeries, and ER visits in previous 12 months Vitals Screenings to include cognitive, depression, and falls Referrals and appointments  In addition, I have reviewed and discussed with patient certain preventive protocols, quality metrics, and best practice recommendations. A written personalized care plan for preventive services as well as general preventive health recommendations were provided to patient.     Sandrea Hammond, LPN   3/69/2230   Nurse Notes: None

## 2020-11-11 NOTE — Patient Instructions (Signed)
Selena Barnes , Thank you for taking time to come for your Medicare Wellness Visit. I appreciate your ongoing commitment to your health goals. Please review the following plan we discussed and let me know if I can assist you in the future.   Screening recommendations/referrals: Colonoscopy: Done 07/27/2015 - Repeat in 10 years Mammogram: Done 09/03/2020 - Repeat annually  Bone Density: Done 04/12/2018 - Repeat every 2 years *next visit Recommended yearly ophthalmology/optometry visit for glaucoma screening and checkup Recommended yearly dental visit for hygiene and checkup  Vaccinations: Influenza vaccine: Done 12/17/2019 - Repeat annually  Pneumococcal vaccine: Done 10/11/2016 & 01/18/2018 Tdap vaccine: Done 9/272017 - Repeat in 10 years  Shingles vaccine: Zostavax done 2015 - Shingrix discussed. Please contact your pharmacy for coverage information.     Covid-19: Done 03/20/2019, 04/18/2019, & 12/10/2019 - recommend second booster - check with your pharmacy  Advanced directives: Please bring a copy of your health care power of attorney and living will to the office to be added to your chart at your convenience.   Conditions/risks identified: Aim for 30 minutes of exercise or brisk walking each day, drink 6-8 glasses of water and eat lots of fruits and vegetables.   Next appointment: Follow up in one year for your annual wellness visit    Preventive Care 65 Years and Older, Female Preventive care refers to lifestyle choices and visits with your health care provider that can promote health and wellness. What does preventive care include? A yearly physical exam. This is also called an annual well check. Dental exams once or twice a year. Routine eye exams. Ask your health care provider how often you should have your eyes checked. Personal lifestyle choices, including: Daily care of your teeth and gums. Regular physical activity. Eating a healthy diet. Avoiding tobacco and drug use. Limiting  alcohol use. Practicing safe sex. Taking low-dose aspirin every day. Taking vitamin and mineral supplements as recommended by your health care provider. What happens during an annual well check? The services and screenings done by your health care provider during your annual well check will depend on your age, overall health, lifestyle risk factors, and family history of disease. Counseling  Your health care provider may ask you questions about your: Alcohol use. Tobacco use. Drug use. Emotional well-being. Home and relationship well-being. Sexual activity. Eating habits. History of falls. Memory and ability to understand (cognition). Work and work Statistician. Reproductive health. Screening  You may have the following tests or measurements: Height, weight, and BMI. Blood pressure. Lipid and cholesterol levels. These may be checked every 5 years, or more frequently if you are over 31 years old. Skin check. Lung cancer screening. You may have this screening every year starting at age 38 if you have a 30-pack-year history of smoking and currently smoke or have quit within the past 15 years. Fecal occult blood test (FOBT) of the stool. You may have this test every year starting at age 80. Flexible sigmoidoscopy or colonoscopy. You may have a sigmoidoscopy every 5 years or a colonoscopy every 10 years starting at age 101. Hepatitis C blood test. Hepatitis B blood test. Sexually transmitted disease (STD) testing. Diabetes screening. This is done by checking your blood sugar (glucose) after you have not eaten for a while (fasting). You may have this done every 1-3 years. Bone density scan. This is done to screen for osteoporosis. You may have this done starting at age 56. Mammogram. This may be done every 1-2 years. Talk to your  health care provider about how often you should have regular mammograms. Talk with your health care provider about your test results, treatment options, and if  necessary, the need for more tests. Vaccines  Your health care provider may recommend certain vaccines, such as: Influenza vaccine. This is recommended every year. Tetanus, diphtheria, and acellular pertussis (Tdap, Td) vaccine. You may need a Td booster every 10 years. Zoster vaccine. You may need this after age 22. Pneumococcal 13-valent conjugate (PCV13) vaccine. One dose is recommended after age 5. Pneumococcal polysaccharide (PPSV23) vaccine. One dose is recommended after age 43. Talk to your health care provider about which screenings and vaccines you need and how often you need them. This information is not intended to replace advice given to you by your health care provider. Make sure you discuss any questions you have with your health care provider. Document Released: 02/26/2015 Document Revised: 10/20/2015 Document Reviewed: 12/01/2014 Elsevier Interactive Patient Education  2017 [place] Prevention in the Home Falls can cause injuries. They can happen to people of all ages. There are many things you can do to make your home safe and to help prevent falls. What can I do on the outside of my home? Regularly fix the edges of walkways and driveways and fix any cracks. Remove anything that might make you trip as you walk through a door, such as a raised step or threshold. Trim any bushes or trees on the path to your home. Use bright outdoor lighting. Clear any walking paths of anything that might make someone trip, such as rocks or tools. Regularly check to see if handrails are loose or broken. Make sure that both sides of any steps have handrails. Any raised decks and porches should have guardrails on the edges. Have any leaves, snow, or ice cleared regularly. Use sand or salt on walking paths during winter. Clean up any spills in your garage right away. This includes oil or grease spills. What can I do in the bathroom? Use night lights. Install grab bars by the toilet  and in the tub and shower. Do not use towel bars as grab bars. Use non-skid mats or decals in the tub or shower. If you need to sit down in the shower, use a plastic, non-slip stool. Keep the floor dry. Clean up any water that spills on the floor as soon as it happens. Remove soap buildup in the tub or shower regularly. Attach bath mats securely with double-sided non-slip rug tape. Do not have throw rugs and other things on the floor that can make you trip. What can I do in the bedroom? Use night lights. Make sure that you have a light by your bed that is easy to reach. Do not use any sheets or blankets that are too big for your bed. They should not hang down onto the floor. Have a firm chair that has side arms. You can use this for support while you get dressed. Do not have throw rugs and other things on the floor that can make you trip. What can I do in the kitchen? Clean up any spills right away. Avoid walking on wet floors. Keep items that you use a lot in easy-to-reach places. If you need to reach something above you, use a strong step stool that has a grab bar. Keep electrical cords out of the way. Do not use floor polish or wax that makes floors slippery. If you must use wax, use non-skid floor wax. Do not have  throw rugs and other things on the floor that can make you trip. What can I do with my stairs? Do not leave any items on the stairs. Make sure that there are handrails on both sides of the stairs and use them. Fix handrails that are broken or loose. Make sure that handrails are as long as the stairways. Check any carpeting to make sure that it is firmly attached to the stairs. Fix any carpet that is loose or worn. Avoid having throw rugs at the top or bottom of the stairs. If you do have throw rugs, attach them to the floor with carpet tape. Make sure that you have a light switch at the top of the stairs and the bottom of the stairs. If you do not have them, ask someone to add  them for you. What else can I do to help prevent falls? Wear shoes that: Do not have high heels. Have rubber bottoms. Are comfortable and fit you well. Are closed at the toe. Do not wear sandals. If you use a stepladder: Make sure that it is fully opened. Do not climb a closed stepladder. Make sure that both sides of the stepladder are locked into place. Ask someone to hold it for you, if possible. Clearly mark and make sure that you can see: Any grab bars or handrails. First and last steps. Where the edge of each step is. Use tools that help you move around (mobility aids) if they are needed. These include: Canes. Walkers. Scooters. Crutches. Turn on the lights when you go into a dark area. Replace any light bulbs as soon as they burn out. Set up your furniture so you have a clear path. Avoid moving your furniture around. If any of your floors are uneven, fix them. If there are any pets around you, be aware of where they are. Review your medicines with your doctor. Some medicines can make you feel dizzy. This can increase your chance of falling. Ask your doctor what other things that you can do to help prevent falls. This information is not intended to replace advice given to you by your health care provider. Make sure you discuss any questions you have with your health care provider. Document Released: 11/26/2008 Document Revised: 07/08/2015 Document Reviewed: 03/06/2014 Elsevier Interactive Patient Education  2017 [employer].

## 2020-12-08 DIAGNOSIS — M19071 Primary osteoarthritis, right ankle and foot: Secondary | ICD-10-CM | POA: Diagnosis not present

## 2020-12-08 DIAGNOSIS — M19072 Primary osteoarthritis, left ankle and foot: Secondary | ICD-10-CM | POA: Diagnosis not present

## 2020-12-24 ENCOUNTER — Other Ambulatory Visit: Payer: Self-pay

## 2020-12-24 ENCOUNTER — Ambulatory Visit (INDEPENDENT_AMBULATORY_CARE_PROVIDER_SITE_OTHER): Payer: Medicare Other | Admitting: Family Medicine

## 2020-12-24 ENCOUNTER — Encounter: Payer: Self-pay | Admitting: Family Medicine

## 2020-12-24 VITALS — BP 119/70 | HR 99 | Ht 65.0 in | Wt 160.0 lb

## 2020-12-24 DIAGNOSIS — Z23 Encounter for immunization: Secondary | ICD-10-CM

## 2020-12-24 DIAGNOSIS — H04123 Dry eye syndrome of bilateral lacrimal glands: Secondary | ICD-10-CM | POA: Diagnosis not present

## 2020-12-24 DIAGNOSIS — E785 Hyperlipidemia, unspecified: Secondary | ICD-10-CM | POA: Diagnosis not present

## 2020-12-24 DIAGNOSIS — H40033 Anatomical narrow angle, bilateral: Secondary | ICD-10-CM | POA: Diagnosis not present

## 2020-12-24 DIAGNOSIS — M8580 Other specified disorders of bone density and structure, unspecified site: Secondary | ICD-10-CM | POA: Diagnosis not present

## 2020-12-24 DIAGNOSIS — G43919 Migraine, unspecified, intractable, without status migrainosus: Secondary | ICD-10-CM | POA: Diagnosis not present

## 2020-12-24 DIAGNOSIS — K219 Gastro-esophageal reflux disease without esophagitis: Secondary | ICD-10-CM

## 2020-12-24 DIAGNOSIS — G4709 Other insomnia: Secondary | ICD-10-CM | POA: Diagnosis not present

## 2020-12-24 MED ORDER — ESOMEPRAZOLE MAGNESIUM 40 MG PO CPDR: 40.0000 mg | DELAYED_RELEASE_CAPSULE | Freq: Every day | ORAL | 3 refills | Status: DC

## 2020-12-24 MED ORDER — DULOXETINE HCL 60 MG PO CPEP: 60.0000 mg | ORAL_CAPSULE | Freq: Two times a day (BID) | ORAL | 3 refills | Status: DC

## 2020-12-24 MED ORDER — ROSUVASTATIN CALCIUM 20 MG PO TABS: 20.0000 mg | ORAL_TABLET | Freq: Every day | ORAL | 3 refills | Status: DC

## 2020-12-24 MED ORDER — AJOVY 225 MG/1.5ML ~~LOC~~ SOAJ: 225.0000 mg | SUBCUTANEOUS | 3 refills | Status: DC

## 2020-12-24 MED ORDER — PROMETHAZINE HCL 25 MG PO TABS: 25.0000 mg | ORAL_TABLET | Freq: Four times a day (QID) | ORAL | 3 refills | Status: DC | PRN

## 2020-12-24 MED ORDER — ESTRADIOL 0.1 MG/GM VA CREA: 1.0000 | TOPICAL_CREAM | Freq: Every day | VAGINAL | 3 refills | Status: DC

## 2020-12-24 MED ORDER — RIZATRIPTAN BENZOATE 10 MG PO TBDP: 10.0000 mg | ORAL_TABLET | ORAL | 3 refills | Status: DC | PRN

## 2020-12-24 NOTE — Progress Notes (Signed)
BP 119/70   Pulse 99   Ht 5' 5"  (1.651 m)   Wt 160 lb (72.6 kg)   SpO2 96%   BMI 26.63 kg/m    Subjective:   Patient ID: Selena Barnes, female    DOB: 1950-12-05, 70 y.o.   MRN: [id number]  HPI: Selena Barnes is a 70 y.o. female presenting on 12/24/2020 for Medical Management of Chronic Issues, Hyperlipidemia, and Ear Pain (right)   HPI Hyperlipidemia Patient is coming in for recheck of his hyperlipidemia. The patient is currently taking Crestor. They deny any issues with myalgias or history of liver damage from it. They deny any focal numbness or weakness or chest pain.   GERD Patient is currently on Nexium.  She denies any major symptoms or abdominal pain or belching or burping. She denies any blood in her stool or lightheadedness or dizziness. ''  Migraine recheck insomnia Patient is coming for recheck for migraines and insomnia.  She is currently taking Cymbalta and uses the Maxalt as needed and then also takes injectable Ajovy and feels like she is doing better.  She still has about 2 migraines per week and says they are still significant but she does feel like they produced a little bit.  She is at least happy that she is not taking as many pills to control them as she is still and it does feel like it is at least stable right now.  Relevant past medical, surgical, family and social history reviewed and updated as indicated. Interim medical history since our last visit reviewed. Allergies and medications reviewed and updated.  Review of Systems  Constitutional:  Negative for chills and fever.  Eyes:  Negative for visual disturbance.  Respiratory:  Negative for chest tightness and shortness of breath.   Cardiovascular:  Negative for chest pain and leg swelling.  Musculoskeletal:  Negative for back pain and gait problem.  Skin:  Negative for rash.  Neurological:  Negative for light-headedness and headaches.  Psychiatric/Behavioral:  Negative for agitation and behavioral  problems.   All other systems reviewed and are negative.  Per HPI unless specifically indicated above   Allergies as of 12/24/2020       Reactions   Sulfa Antibiotics    Sulfacetamide Other (See Comments), Swelling        Medication List        Accurate as of December 24, 2020 11:59 AM. If you have any questions, ask your nurse or doctor.          Ajovy 225 MG/1.5ML Soaj Generic drug: Fremanezumab-vfrm Inject 225 mg into the skin every 30 (thirty) days.   Biotin 1 MG Caps Take by mouth.   Calcium Carb-Cholecalciferol 600-800 MG-UNIT Tabs Caltrate with Vitamin D3 600 mg-20 mcg (800 unit) tablet   Coenzyme Q10 100 MG capsule CoQ-10 100 mg capsule   DULoxetine 60 MG capsule Commonly known as: CYMBALTA Take 1 capsule (60 mg total) by mouth 2 (two) times daily.   esomeprazole 40 MG capsule Commonly known as: NEXIUM Take 1 capsule (40 mg total) by mouth daily at 12 noon.   estradiol 0.1 MG/GM vaginal cream Commonly known as: ESTRACE VAGINAL Place 1 Applicatorful vaginally at bedtime.   fish oil-omega-3 fatty acids 1000 MG capsule Take 2 g by mouth 2 (two) times daily.   Loratadine 10 MG Caps Take by mouth.   promethazine 25 MG tablet Commonly known as: PHENERGAN Take 1 tablet (25 mg total) by mouth every 6 (six) hours  as needed for nausea or vomiting.   rizatriptan 10 MG disintegrating tablet Commonly known as: Maxalt-MLT Take 1 tablet (10 mg total) by mouth as needed for migraine. May repeat in 2 hours if needed   rosuvastatin 20 MG tablet Commonly known as: Crestor Take 1 tablet (20 mg total) by mouth daily.         Objective:   BP 119/70   Pulse 99   Ht 5' 5"  (1.651 m)   Wt 160 lb (72.6 kg)   SpO2 96%   BMI 26.63 kg/m   Wt Readings from Last 3 Encounters:  12/24/20 160 lb (72.6 kg)  11/11/20 160 lb (72.6 kg)  01/07/20 161 lb (73 kg)    Physical Exam Vitals and nursing note reviewed.  Constitutional:      General: She is not in  acute distress.    Appearance: She is well-developed. She is not diaphoretic.  Eyes:     Conjunctiva/sclera: Conjunctivae normal.  Cardiovascular:     Rate and Rhythm: Normal rate and regular rhythm.     Heart sounds: Normal heart sounds. No murmur heard. Pulmonary:     Effort: Pulmonary effort is normal. No respiratory distress.     Breath sounds: Normal breath sounds. No wheezing.  Musculoskeletal:        General: No swelling or tenderness. Normal range of motion.  Skin:    General: Skin is warm and dry.     Findings: No rash.  Neurological:     Mental Status: She is alert and oriented to person, place, and time.     Coordination: Coordination normal.  Psychiatric:        Behavior: Behavior normal.      Assessment & Plan:   Problem List Items Addressed This Visit       Cardiovascular and Mediastinum   Migraines   Relevant Medications   DULoxetine (CYMBALTA) 60 MG capsule   Fremanezumab-vfrm (AJOVY) 225 MG/1.5ML SOAJ   rizatriptan (MAXALT-MLT) 10 MG disintegrating tablet   rosuvastatin (CRESTOR) 20 MG tablet     Digestive   GERD (gastroesophageal reflux disease)   Relevant Medications   esomeprazole (NEXIUM) 40 MG capsule   Other Relevant Orders   CBC with Differential/Platelet   CMP14+EGFR     Musculoskeletal and Integument   Osteopenia with high risk of fracture   Relevant Orders   DG [facility] DEXA     Other   Insomnia   Relevant Orders   TSH   Hyperlipidemia with target LDL less than 100 - Primary   Relevant Medications   rosuvastatin (CRESTOR) 20 MG tablet   Other Relevant Orders   Lipid panel    No change in medication, continuing on current medicine Follow up plan: Return if symptoms worsen or fail to improve, for hld and migraines.  Counseling provided for all of the vaccine components Orders Placed This Encounter  Procedures   DG [facility] DEXA   CBC with Differential/Platelet   CMP14+EGFR   Lipid panel   TSH    Caryl Pina, MD [facility] 12/24/2020, 11:59 AM

## 2020-12-24 NOTE — Addendum Note (Signed)
Addended by: Caryl Pina on: 12/24/2020 12:13 PM   Modules accepted: Orders

## 2020-12-25 LAB — CBC WITH DIFFERENTIAL/PLATELET
Basophils Absolute: 0.1 10*3/uL (ref 0.0–0.2)
Basos: 1 %
EOS (ABSOLUTE): 0.2 10*3/uL (ref 0.0–0.4)
Eos: 4 %
Hematocrit: 39.5 % (ref 34.0–46.6)
Hemoglobin: 13.7 g/dL (ref 11.1–15.9)
Immature Grans (Abs): 0 10*3/uL (ref 0.0–0.1)
Immature Granulocytes: 0 %
Lymphocytes Absolute: 2.9 10*3/uL (ref 0.7–3.1)
Lymphs: 42 %
MCH: 31.9 pg (ref 26.6–33.0)
MCHC: 34.7 g/dL (ref 31.5–35.7)
MCV: 92 fL (ref 79–97)
Monocytes Absolute: 0.4 10*3/uL (ref 0.1–0.9)
Monocytes: 6 %
Neutrophils Absolute: 3.2 10*3/uL (ref 1.4–7.0)
Neutrophils: 47 %
Platelets: 252 10*3/uL (ref 150–450)
RBC: 4.29 x10E6/uL (ref 3.77–5.28)
RDW: 12 % (ref 11.7–15.4)
WBC: 6.8 10*3/uL (ref 3.4–10.8)

## 2020-12-25 LAB — CMP14+EGFR
ALT: 71 IU/L — ABNORMAL HIGH (ref 0–32)
AST: 71 IU/L — ABNORMAL HIGH (ref 0–40)
Albumin/Globulin Ratio: 1.5 (ref 1.2–2.2)
Albumin: 4.4 g/dL (ref 3.8–4.8)
Alkaline Phosphatase: 65 IU/L (ref 44–121)
BUN/Creatinine Ratio: 18 (ref 12–28)
BUN: 13 mg/dL (ref 8–27)
Bilirubin Total: 0.3 mg/dL (ref 0.0–1.2)
CO2: 25 mmol/L (ref 20–29)
Calcium: 9.8 mg/dL (ref 8.7–10.3)
Chloride: 101 mmol/L (ref 96–106)
Creatinine, Ser: 0.74 mg/dL (ref 0.57–1.00)
Globulin, Total: 2.9 g/dL (ref 1.5–4.5)
Glucose: 111 mg/dL — ABNORMAL HIGH (ref 70–99)
Potassium: 3.9 mmol/L (ref 3.5–5.2)
Sodium: 141 mmol/L (ref 134–144)
Total Protein: 7.3 g/dL (ref 6.0–8.5)
eGFR: 88 mL/min/{1.73_m2} (ref >59)

## 2020-12-25 LAB — LIPID PANEL
Chol/HDL Ratio: 2.7 ratio (ref 0.0–4.4)
Cholesterol, Total: 144 mg/dL (ref 100–199)
HDL: 53 mg/dL (ref >39)
LDL Chol Calc (NIH): 70 mg/dL (ref 0–99)
Triglycerides: 117 mg/dL (ref 0–149)
VLDL Cholesterol Cal: 21 mg/dL (ref 5–40)

## 2020-12-25 LAB — TSH: TSH: 2.04 u[IU]/mL (ref 0.450–4.500)

## 2020-12-30 ENCOUNTER — Other Ambulatory Visit: Payer: Self-pay

## 2020-12-30 DIAGNOSIS — R748 Abnormal levels of other serum enzymes: Secondary | ICD-10-CM

## 2021-01-25 ENCOUNTER — Other Ambulatory Visit: Payer: Medicare Other

## 2021-01-25 ENCOUNTER — Other Ambulatory Visit: Payer: Self-pay | Admitting: Family Medicine

## 2021-01-25 ENCOUNTER — Ambulatory Visit (INDEPENDENT_AMBULATORY_CARE_PROVIDER_SITE_OTHER): Payer: Medicare Other

## 2021-01-25 DIAGNOSIS — R748 Abnormal levels of other serum enzymes: Secondary | ICD-10-CM | POA: Diagnosis not present

## 2021-01-25 DIAGNOSIS — Z780 Asymptomatic menopausal state: Secondary | ICD-10-CM

## 2021-01-25 DIAGNOSIS — M8580 Other specified disorders of bone density and structure, unspecified site: Secondary | ICD-10-CM

## 2021-01-25 DIAGNOSIS — M8588 Other specified disorders of bone density and structure, other site: Secondary | ICD-10-CM | POA: Diagnosis not present

## 2021-01-26 LAB — HEPATIC FUNCTION PANEL
ALT: 69 IU/L — ABNORMAL HIGH (ref 0–32)
AST: 69 IU/L — ABNORMAL HIGH (ref 0–40)
Albumin: 4.6 g/dL (ref 3.8–4.8)
Alkaline Phosphatase: 73 IU/L (ref 44–121)
Bilirubin Total: 0.3 mg/dL (ref 0.0–1.2)
Bilirubin, Direct: 0.11 mg/dL (ref 0.00–0.40)
Total Protein: 7.1 g/dL (ref 6.0–8.5)

## 2021-01-27 DIAGNOSIS — M85851 Other specified disorders of bone density and structure, right thigh: Secondary | ICD-10-CM | POA: Diagnosis not present

## 2021-01-27 DIAGNOSIS — M810 Age-related osteoporosis without current pathological fracture: Secondary | ICD-10-CM | POA: Diagnosis not present

## 2021-01-28 ENCOUNTER — Other Ambulatory Visit: Payer: Self-pay | Admitting: Family Medicine

## 2021-01-28 ENCOUNTER — Telehealth: Payer: Self-pay

## 2021-01-28 DIAGNOSIS — M8580 Other specified disorders of bone density and structure, unspecified site: Secondary | ICD-10-CM

## 2021-01-28 NOTE — Chronic Care Management (AMB) (Signed)
°  Chronic Care Management   Note  01/28/2021 Name: Selena Barnes MRN: [id number] DOB: 01-Aug-1950  Selena Barnes is a 70 y.o. year old female who is a primary care patient of Dettinger, Fransisca Kaufmann, MD. Selena Barnes is currently enrolled in care management services. An additional referral for Pharm D  was placed.   Follow up plan: Telephone appointment with care management team member scheduled for:03/11/2021  Noreene Larsson, [address], [place], [place] [address] Direct Dial: [phone] [email or web address] Website: [email or web address]

## 2021-03-02 ENCOUNTER — Telehealth: Payer: Self-pay | Admitting: Family Medicine

## 2021-03-11 ENCOUNTER — Telehealth: Payer: Medicare Other

## 2021-04-13 ENCOUNTER — Telehealth: Payer: Medicare Other

## 2021-05-12 ENCOUNTER — Ambulatory Visit: Payer: Medicare Other | Admitting: Pharmacist

## 2021-05-12 ENCOUNTER — Telehealth: Payer: Self-pay | Admitting: Family Medicine

## 2021-05-12 DIAGNOSIS — M8580 Other specified disorders of bone density and structure, unspecified site: Secondary | ICD-10-CM

## 2021-05-12 NOTE — Telephone Encounter (Signed)
Pt returned missed call from [place]. Says her telephone call was supposed to be at 8:30 this morning but didn't get a call until almost 10:00 and she couldn't answer the phone because she was being seen for another appt. ? ?Wants to know if she can be scheduled to come in and see Almyra Free instead of doing a telephone call. ?

## 2021-05-25 ENCOUNTER — Telehealth: Payer: Self-pay

## 2021-05-25 NOTE — Telephone Encounter (Signed)
Please let patient know i'm sorry for missing her call and playing phone tag! I've been out of the office.  I can see her in clinic in person if she prefer ?Sending to you to reschedule :) ?

## 2021-05-25 NOTE — Chronic Care Management (AMB) (Signed)
?  Care Management  ? ?Note ? ?05/25/2021 ?Name: Selena Barnes MRN: [id number] DOB: Jun 30, 1950 ? ?Selena Barnes is a 71 y.o. year old female who is a primary care patient of Dettinger, Fransisca Kaufmann, MD and is actively engaged with the care management team. I reached out to [employer] by phone today to assist with re-scheduling an initial visit with the Pharmacist ? ?Follow up plan: ?Face to Face appointment with care management team member scheduled for: 06/10/2021 ? ?Noreene Larsson, RMA ?Care Guide, Embedded Care Coordination ?[facility]  ?[place], [place] [address] ?Direct Dial: [phone] ?Museum/[email or web address] ?Website: [email or web address]  ? ?

## 2021-05-25 NOTE — Chronic Care Management (AMB) (Signed)
?  Care Management  ? ?Note ? ?05/25/2021 ?Name: Selena Barnes MRN: [id number] DOB: 29-Jan-1951 ? ?Selena Barnes is a 71 y.o. year old female who is a primary care patient of Dettinger, Fransisca Kaufmann, MD and is actively engaged with the care management team. I reached out to [employer] by phone today to assist with re-scheduling an initial visit with the Pharmacist ? ?Follow up plan: ?Unsuccessful telephone outreach attempt made. A HIPAA compliant phone message was left for the patient providing contact information and requesting a return call.  ?The care management team will reach out to the patient again over the next 7 days.  ?If patient returns call to provider office, please advise to call [place]  at [phone] ? ?Noreene Larsson, RMA ?Care Guide, Embedded Care Coordination ?[facility]  ?[place], [place] [address] ?Direct Dial: [phone] ?[email or web address] ?Website: [email or web address]  ? ?

## 2021-05-25 NOTE — Progress Notes (Signed)
?  Care Management  ? ?Follow Up Note ? ? ?05/12/2021 ?Name: Selena Barnes MRN: [id number] DOB: [birth year] ? ? ?Referred by: Dettinger, Fransisca Kaufmann, MD ?Reason for referral : osteoporosis/bone health ? ? ?An unsuccessful telephone outreach was attempted today. The patient was referred to the case management team for assistance with care management and care coordination.  ? ?Follow Up Plan: The care management team will reach out to the patient again over the next 7 days to reschedule.  ? ? ? ?Regina Eck, PharmD, BCPS ?Clinical Pharmacist, [facility] ?[place]  II Phone [phone] ? ?

## 2021-06-10 ENCOUNTER — Encounter: Payer: Self-pay | Admitting: Family Medicine

## 2021-06-10 ENCOUNTER — Ambulatory Visit (INDEPENDENT_AMBULATORY_CARE_PROVIDER_SITE_OTHER): Payer: Medicare Other | Admitting: Pharmacist

## 2021-06-10 DIAGNOSIS — R748 Abnormal levels of other serum enzymes: Secondary | ICD-10-CM | POA: Diagnosis not present

## 2021-06-10 DIAGNOSIS — Z23 Encounter for immunization: Secondary | ICD-10-CM

## 2021-06-10 DIAGNOSIS — M810 Age-related osteoporosis without current pathological fracture: Secondary | ICD-10-CM | POA: Diagnosis not present

## 2021-06-10 NOTE — Progress Notes (Signed)
? ? ?06/10/2021 ?Name: Selena Barnes MRN: [id number] DOB: [birth year] ? ? ?S:  9 yoF Presents for osteoporosis evaluation, education, and management.  Patient is currently undergoing calcium vitamin D supplementation.  She has a family history of hip fracture in a parent. Follow-up osteoporosis ?identified on prior bone density examination. ? ?Current Height:    5'5"    Max Lifetime Height:  5'5" ?Current Weight:   160lb     ? ?Ethnicity:Caucasian  ? ?HPI: ?Does pt already have a diagnosis of:  ?Osteoporosis?  Yes ? ?Back Pain?  No       Kyphosis?  No ?Prior fracture?  No ?Med(s) for Osteoporosis/Osteopenia:  calcium/vit D ?Med(s) previously tried for Osteoporosis/Osteopenia:  evista ?                                                            ?PMH: ?HRT?  Yes - Former.  Type/duration: estradiol for 40 years - just stopped recently ? -only PRN use of topical now ?Steroid Use?  No ?Thyroid med?  No ?History of cancer?  No ?History of digestive disorders (ie Crohn's)?  Yes, GERD on chronic PPI ?Current or previous eating disorders?  No ?Last Vitamin D Result:  55 (2018--needs repeat, will order) ?Last GFR Result:  88 (12/24/2020) ?  ?FH/SH: ?Family history of osteoporosis?  No ?Parent with history of hip fracture?  Yes, mother ?Family history of breast cancer?  No ?Exercise?  PT/rehab ?Smoking?  No ?Alcohol?  PRN ?  ? ?Calcium Assessment ?Calcium Intake  # of servings/day  Calcium mg  ?Milk (8 oz) 0  x  300  = 0  ?Yogurt (4 oz) 0 x  200 = 0  ?Cheese (1 oz) 1 x  200 = 200  ?Other Calcium sources   '250mg'$   ?Ca supplement [phone] = [phone]  ? Estimated calcium intake per day 1050-1650  ? ? ?DEXA Results from 01/25/2021 ?FINDINGS: ?AP LUMBAR SPINE (L1-L3) ? Bone Mineral Density (BMD):  0.775 g/cm2 ? Young Adult T-Score:  -3.3 ? Z-Score:  -1.9 ?  ?RIGHT FEMUR NECK ? Bone Mineral Density (BMD):  0.856 g/cm2 ? Young Adult T-Score: -1.3 ? Z-Score:  0.2 ? ?FRAX 10 year estimate: ?  ?FRAX: World Health Organization FRAX  assessment of absolute fracture ?risk is not calculated for this patient because the patient has ?osteoporosis. ? ?Assessment: ?ASSESSMENT: Patient's diagnostic category is OSTEOPOROSIS by WHO Criteria.  FRACTURE RISK: INCREASED. ? ?Since the prior examination in 2020, there has been a statistically ?significant 4.9% decrease in bone mineral density in the LUMBAR ?SPINE and a statistically significant 5.0% increase in bone mineral ?density in the TOTAL MEAN FEMUR. ?  ?Since the baseline examination in 2008, there has been a ?statistically significant 14.6% decrease in bone mineral density in ?the LUMBAR SPINE and a statistically significant 6.2% decrease in ?bone mineral density in the TOTAL MEAN FEMUR. ? ?Recommendations: ?1.  Discussed bisphonate therapy, however patient reports history of bone breakdown of the jaw and she has had extensive dental reconstructive therapy.  We would also want to avoid RANKL therapy (Prolia) and Sclerostin Inhibitor (Evenity)  all due to ONJ risk/history.  Could consider anabolic therapy (I.e forteo, tymls), but potentially cost prohibitive.  Patient has completed max recommended time on Evista therapy.  Patient would like  to continue calcium/vitD therapy and lifestyle modifications for now. ?2.  continue calcium '1200mg'$  daily through supplementation or diet.  ?3.  recommend weight bearing exercise - 30 minutes at least 4 days per week.   ?4.  Counseled and educated about fall risk and prevention. ? ?Recheck DEXA:  2 years ? ?Time spent counseling patient:  20 minutes ? ? ?Regina Eck, PharmD, BCPS ?Clinical Pharmacist, [facility] ?[place]  II Phone [phone] ? ? ? ? ? ? ?

## 2021-06-11 LAB — HEPATIC FUNCTION PANEL
ALT: 38 IU/L — ABNORMAL HIGH (ref 0–32)
AST: 40 IU/L (ref 0–40)
Albumin: 4.3 g/dL (ref 3.8–4.8)
Alkaline Phosphatase: 88 IU/L (ref 44–121)
Bilirubin Total: <0.2 mg/dL (ref 0.0–1.2)
Bilirubin, Direct: <0.1 mg/dL (ref 0.00–0.40)
Total Protein: 7 g/dL (ref 6.0–8.5)

## 2021-08-17 ENCOUNTER — Other Ambulatory Visit: Payer: Self-pay | Admitting: Family Medicine

## 2021-08-18 ENCOUNTER — Ambulatory Visit (INDEPENDENT_AMBULATORY_CARE_PROVIDER_SITE_OTHER): Payer: Medicare Other | Admitting: Physician Assistant

## 2021-08-18 ENCOUNTER — Other Ambulatory Visit: Payer: Self-pay

## 2021-08-18 ENCOUNTER — Encounter: Payer: Self-pay | Admitting: Physician Assistant

## 2021-08-18 VITALS — BP 129/80 | HR 91 | Temp 98.5°F | Ht 64.0 in | Wt 154.0 lb

## 2021-08-18 DIAGNOSIS — N644 Mastodynia: Secondary | ICD-10-CM

## 2021-08-18 DIAGNOSIS — Z1231 Encounter for screening mammogram for malignant neoplasm of breast: Secondary | ICD-10-CM

## 2021-08-18 NOTE — Patient Instructions (Signed)
Breast Tenderness Breast tenderness is a common problem for women of all ages, but may also occur in men. Breast tenderness may range from mild discomfort to severe pain. In women, the pain usually comes and goes with the menstrual cycle, but it can also be constant. Breast tenderness has many possible causes, including hormone changes, infections, and taking certain medicines. You may have tests, such as a mammogram or an ultrasound, to check for any unusual findings. Having breast tenderness usually does not mean that you have breast cancer. Follow these instructions at home: Managing pain and discomfort  If directed, put ice to the painful area. To do this: Put ice in a plastic bag. Place a towel between your skin and the bag. Leave the ice on for 20 minutes, 2-3 times a day. Wear a supportive bra, especially during exercise. You may also want to wear a supportive bra while sleeping if your breasts are very tender. Medicines Take over-the-counter and prescription medicines only as told by your health care provider. If the cause of your pain is infection, you may be prescribed an antibiotic medicine. If you were prescribed an antibiotic, take it as told by your health care provider. Do not stop taking the antibiotic even if you start to feel better. Eating and drinking Your health care provider may recommend that you lessen the amount of fat in your diet. You can do this by: Limiting fried foods. Cooking foods using methods such as baking, boiling, grilling, and broiling. Decrease the amount of caffeine in your diet. Instead, drink more water and choose caffeine-free drinks. General instructions  Keep a log of the days and times when your breasts are most tender. Ask your health care provider how to do breast exams at home. This will help you notice if you have an unusual growth or lump. Keep all follow-up visits as told by your health care provider. This is important. Contact a health care  provider if: Any part of your breast is hard, red, and hot to the touch. This may be a sign of infection. You are a woman and: Not breastfeeding and you have fluid, especially blood or pus, coming out of your nipples. Have a new or painful lump in your breast that remains after your menstrual period ends. You have a fever. Your pain does not improve or it gets worse. Your pain is interfering with your daily activities. Summary Breast tenderness may range from mild discomfort to severe pain. Breast tenderness has many possible causes, including hormone changes, infections, and taking certain medicines. It can be treated with ice, wearing a supportive bra, and medicines. Make changes to your diet if told to by your health care provider. This information is not intended to replace advice given to you by your health care provider. Make sure you discuss any questions you have with your health care provider. Document Revised: 06/24/2018 Document Reviewed: 06/24/2018 Elsevier Patient Education  2023 Elsevier Inc.  

## 2021-08-18 NOTE — Progress Notes (Signed)
  Subjective:     Patient ID: Selena Barnes, female   DOB: [birth year], 71 y.o.   MRN: [id number]  HPI Pt with L breast tenderness x 3 months She denies injury Sx to the lateral breast with radiation to the nipple area Denies any nipple discharge No hx of same No FH or breast Ca  Review of Systems  Constitutional: Negative.   Respiratory: Negative.    Cardiovascular: Negative.        Objective:   Physical Exam Vitals and nursing note reviewed. Exam conducted with a chaperone present.  Constitutional:      General: She is not in acute distress.    Appearance: Normal appearance. She is normal weight. She is not toxic-appearing.  Neurological:     Mental Status: She is alert.   No ecchy, erythema, edema to the L breast No ulcerations or lesions noted + TTP of the lateral breast but no nodules/cysts palp No skin retraction noted No nipple discharge No axillary nodes palp     Assessment:     1. Pain of left breast   2. Encounter for screening mammogram for malignant neoplasm of breast        Plan:     [place] and Mammo scheduled today Normal activities F/U pending lab results PATP

## 2021-08-29 DIAGNOSIS — L570 Actinic keratosis: Secondary | ICD-10-CM | POA: Diagnosis not present

## 2021-08-29 DIAGNOSIS — D2272 Melanocytic nevi of left lower limb, including hip: Secondary | ICD-10-CM | POA: Diagnosis not present

## 2021-08-29 DIAGNOSIS — L814 Other melanin hyperpigmentation: Secondary | ICD-10-CM | POA: Diagnosis not present

## 2021-08-29 DIAGNOSIS — L821 Other seborrheic keratosis: Secondary | ICD-10-CM | POA: Diagnosis not present

## 2021-09-14 ENCOUNTER — Ambulatory Visit: Admission: RE | Admit: 2021-09-14 | Payer: Medicare Other | Source: Ambulatory Visit

## 2021-09-14 ENCOUNTER — Ambulatory Visit
Admission: RE | Admit: 2021-09-14 | Discharge: 2021-09-14 | Disposition: A | Discharge status: Home / Self Care | Payer: Medicare Other | Source: Ambulatory Visit | Attending: Physician Assistant | Admitting: Physician Assistant

## 2021-09-14 ENCOUNTER — Ambulatory Visit: Payer: Medicare Other

## 2021-09-14 DIAGNOSIS — N644 Mastodynia: Secondary | ICD-10-CM

## 2021-11-15 ENCOUNTER — Telehealth: Payer: Medicare Other

## 2021-11-29 ENCOUNTER — Ambulatory Visit (INDEPENDENT_AMBULATORY_CARE_PROVIDER_SITE_OTHER): Payer: Medicare Other | Admitting: Family Medicine

## 2021-11-29 DIAGNOSIS — Z0000 Encounter for general adult medical examination without abnormal findings: Secondary | ICD-10-CM | POA: Diagnosis not present

## 2021-11-29 NOTE — Progress Notes (Signed)
Subjective:   Selena Barnes is a 71 y.o. female who presents for Medicare Annual (Subsequent) preventive examination. I connected with  Hildy Nicholl Mcbrearty on 11/29/21 by a audio enabled telemedicine application and verified that I am speaking with the correct person using two identifiers.  Patient Location: Home  Provider Location: Office/Clinic  I discussed the limitations of evaluation and management by telemedicine. The patient expressed understanding and agreed to proceed.   Review of Systems    Defer to Pcp        Objective:    There were no vitals filed for this visit. There is no height or weight on file to calculate BMI.     11/11/2020    9:11 AM 09/16/2019    1:41 PM 10/23/2018    2:42 PM 01/23/2018    1:06 PM 11/27/2016    8:19 AM 11/18/2015   12:36 PM 11/10/2015    4:02 PM  Advanced Directives  Does Patient Have a Medical Advance Directive? Yes Yes Yes Yes Yes Yes Yes  Type of Paramedic of [place];Living will  [place];Living will  [place];Living will [place];Living will [place];Living will  Does patient want to make changes to medical advance directive?   No - Patient declined  No - Patient declined No - Patient declined   Copy of [place] in Chart? No - copy requested  No - copy requested  Yes Yes Yes    Current Medications (verified) Outpatient Encounter Medications as of 11/29/2021  Medication Sig   Biotin 1 MG CAPS Take by mouth.   Calcium Carb-Cholecalciferol 600-800 MG-UNIT TABS Caltrate with Vitamin D3 600 mg-20 mcg (800 unit) tablet   Coenzyme Q10 100 MG capsule CoQ-10 100 mg capsule   DULoxetine (CYMBALTA) 60 MG capsule Take 1 capsule (60 mg total) by mouth 2 (two) times daily.   esomeprazole (NEXIUM) 40 MG capsule Take 1 capsule (40 mg total) by mouth daily at 12 noon.   estradiol (ESTRACE VAGINAL) 0.1 MG/GM vaginal cream Place 1  Applicatorful vaginally at bedtime.   fish oil-omega-3 fatty acids 1000 MG capsule Take 2 g by mouth 2 (two) times daily.    Fremanezumab-vfrm (AJOVY) 225 MG/1.5ML SOAJ Inject 225 mg into the skin every 30 (thirty) days.   Loratadine 10 MG CAPS Take by mouth.   promethazine (PHENERGAN) 25 MG tablet Take 1 tablet (25 mg total) by mouth every 6 (six) hours as needed for nausea or vomiting.   rizatriptan (MAXALT-MLT) 10 MG disintegrating tablet Take 1 tablet (10 mg total) by mouth as needed for migraine. May repeat in 2 hours if needed   rosuvastatin (CRESTOR) 20 MG tablet Take 1 tablet (20 mg total) by mouth daily.   Facility-Administered Encounter Medications as of 11/29/2021  Medication   meloxicam (MOBIC) tablet 15 mg   methylPREDNISolone acetate (DEPO-MEDROL) injection 40 mg    Allergies (verified) Sulfa antibiotics and Sulfacetamide   History: Past Medical History:  Diagnosis Date   Arthritis    Constipation    Depression    GERD (gastroesophageal reflux disease)    Hyperlipidemia    Insomnia    Migraines    Osteopenia    Past Surgical History:  Procedure Laterality Date   ABDOMINAL HYSTERECTOMY     ANAL RECTAL MANOMETRY N/A 10/06/2015   Procedure: ANO RECTAL MANOMETRY;  Surgeon: Gatha Mayer, MD;  Location: [facility];  Service: Endoscopy;  Laterality:  N/A;   BREAST BIOPSY Right    BREAST EXCISIONAL BIOPSY Right    benign   BREAST EXCISIONAL BIOPSY Left 2000   axillary area. lymph node biopsy- benign   CATARACT EXTRACTION W/ INTRAOCULAR LENS IMPLANT Bilateral 07/27/2016   and 09/27/2016, [facility] in [place], [place]     knee surgery Left 2019   arthroscopic for meniscus tear   LYMPH NODE BIOPSY Left    left arm   Family History  Problem Relation Age of Onset   Heart disease Mother 12       cause of death   Heart disease Father 4       death   Kidney disease Father    Breast cancer Maternal Aunt     Breast cancer Maternal Aunt    Breast cancer Maternal Aunt    Diabetes Brother    Cancer Brother 20       bone marrow cancer   Diabetes Grandchild        grandson   Social History   Socioeconomic History   Marital status: Married    Spouse name: Laveda Abbe   Number of children: 1   Years of education: 12   Highest education level: High school graduate  Occupational History   Occupation: retired  Tobacco Use   Smoking status: Never   Smokeless tobacco: Never  Scientific laboratory technician Use: Never used  Substance and Sexual Activity   Alcohol use: No    Alcohol/week: 0.0 standard drinks of alcohol   Drug use: No   Sexual activity: Yes  Other Topics Concern   Not on file  Social History Narrative   Lives at home one level with husband Laveda Abbe   Right handed   Caffeine: sometimes a coke zero   Son lives one mile away   Social Determinants of Health   Financial Resource Strain: Low Risk  (11/11/2020)   Overall Financial Resource Strain (CARDIA)    Difficulty of Paying Living Expenses: Not hard at all  Food Insecurity: No Food Insecurity (11/11/2020)   Hunger Vital Sign    Worried About Running Out of Food in the Last Year: Never true    [place] in the Last Year: Never true  Transportation Needs: No Transportation Needs (11/11/2020)   PRAPARE - Hydrologist (Medical): No    Lack of Transportation (Non-Medical): No  Physical Activity: Insufficiently Active (11/11/2020)   Exercise Vital Sign    Days of Exercise per Week: 2 days    Minutes of Exercise per Session: 40 min  Stress: No Stress Concern Present (11/11/2020)   [place]    Feeling of Stress : Only a little  Social Connections: Socially Integrated (11/11/2020)   Social Connection and Isolation Panel [NHANES]    Frequency of Communication with Friends and Family: More than three times a week    Frequency of Social  Gatherings with Friends and Family: More than three times a week    Attends Religious Services: More than 4 times per year    Active Member of [employer] or Organizations: Yes    Attends Archivist Meetings: 1 to 4 times per year    Marital Status: Married    Tobacco Counseling Counseling given: Not Answered   Clinical Intake:  Diabetic?No         Activities of Daily Living     No data to display           Patient Care Team: Dettinger, Fransisca Kaufmann, MD as PCP - General (Family Medicine) Gatha Mayer, MD as Consulting Physician (Gastroenterology) Lavera Guise, [facility] as Pharmacist (Family Medicine)  Indicate any recent Medical Services you may have received from other than [facility] providers in the past year (date may be approximate).     Assessment:   This is a routine wellness examination for [place].  Hearing/Vision screen No results found.  Dietary issues and exercise activities discussed:     Goals Addressed   None   Depression Screen    08/18/2021    8:52 AM 12/24/2020   11:35 AM 11/11/2020    9:16 AM 01/07/2020    9:24 AM 10/23/2018    2:43 PM 04/12/2018    8:02 AM 10/12/2017    4:26 PM  PHQ 2/9 Scores  PHQ - 2 Score 0 0 0 1 0 0 0    Fall Risk    12/24/2020   11:35 AM 11/11/2020    9:17 AM 01/07/2020    9:24 AM 10/23/2018    2:43 PM 04/12/2018    8:02 AM  Fall Risk   Falls in the past year? 0 0 0 0 0  Number falls in past yr:  0  0   Injury with Fall?  0  0   Risk for fall due to :  Orthopedic patient     Follow up  Falls prevention discussed  Falls prevention discussed   Comment    get rid of all throw rugs in the house, adequate lighting in the walkways and grab bars in the bathroom     [place]:  Any stairs in or around the home? No  If so, are there any without handrails? No  Home free of loose throw rugs in walkways, pet beds, electrical cords, etc? Yes  Adequate lighting in  your home to reduce risk of falls? Yes   ASSISTIVE DEVICES UTILIZED TO PREVENT FALLS:  Life alert? No  Use of a cane, walker or w/c? No  Grab bars in the bathroom? Yes  Shower chair or bench in shower? No Elevated toilet seat or a handicapped toilet? Yes    Cognitive Function:    11/14/2016   10:59 AM 11/10/2015    3:46 PM 11/03/2014    3:13 PM  MMSE - Mini Mental State Exam  Orientation to time '5 5 5  '$ Orientation to Place '4 5 5  '$ Registration '3 3 3  '$ Attention/ Calculation '5 5 5  '$ Recall '3 3 3  '$ Language- name 2 objects '2 2 2  '$ Language- repeat '1 1 1  '$ Language- follow 3 step command '3 3 3  '$ Language- read & follow direction '1 1 1  '$ Write a sentence '1 1 1  '$ Copy design '1 1 1  '$ Total score '29 30 30        '$ 10/23/2018    2:47 PM  6CIT Screen  What Year? 0 points  What month? 0 points  What time? 0 points  Count back from 20 0 points  Months in reverse 0 points  Repeat phrase 0 points  Total Score 0 points    Immunizations Immunization History  Administered Date(s) Administered   Fluad Quad(high Dose 65+) 11/06/2018, 09/25/2019, 12/17/2019, 12/24/2020   Influenza, High Dose Seasonal  PF 11/14/2016, 01/18/2018   Influenza,inj,Quad PF,6+ Mos 12/20/2012, 01/22/2014, 11/13/2014, 11/08/2015   Moderna Sars-Covid-2 Vaccination 03/20/2019, 04/18/2019, 12/10/2019   PFIZER SARS-COV-2 Pediatric Vaccination 5-51yr 04/30/2019   Pneumococcal Conjugate-13 10/11/2016   Pneumococcal Polysaccharide-23 01/18/2018   Tdap 11/10/2015   Zoster Recombinat (Shingrix) 06/10/2021   Zoster, Live 06/20/2013    TDAP status: Up to date  Flu Vaccine status: Declined, Education has been provided regarding the importance of this vaccine but patient still declined. Advised may receive this vaccine at local pharmacy or Health Dept. Aware to provide a copy of the vaccination record if obtained from local pharmacy or Health Dept. Verbalized acceptance and understanding.  Pneumococcal vaccine status: Up to  date  Covid-19 vaccine status: Completed vaccines  Qualifies for Shingles Vaccine? Yes   Zostavax completed No   Shingrix Completed?: No.    Education has been provided regarding the importance of this vaccine. Patient has been advised to call insurance company to determine out of pocket expense if they have not yet received this vaccine. Advised may also receive vaccine at local pharmacy or Health Dept. Verbalized acceptance and understanding.  Screening Tests Health Maintenance  Topic Date Due   COVID-19 Vaccine (4 - Moderna series) 02/04/2020   Zoster Vaccines- Shingrix (2 of 2) 08/05/2021   INFLUENZA VACCINE  09/13/2021   DEXA SCAN  01/28/2023   MAMMOGRAM  09/15/2023   COLONOSCOPY (Pts 45-445yrInsurance coverage will need to be confirmed)  07/26/2025   TETANUS/TDAP  11/09/2025   Pneumonia Vaccine 6544Years old  Completed   Hepatitis C Screening  Completed   HPV VACCINES  Aged Out    Health Maintenance  Health Maintenance Due  Topic Date Due   COVID-19 Vaccine (4 - Moderna series) 02/04/2020   Zoster Vaccines- Shingrix (2 of 2) 08/05/2021   INFLUENZA VACCINE  09/13/2021    Colorectal cancer screening: Type of screening: Colonoscopy. Completed 03/29/2015. Repeat every 10 years  Mammogram status: Completed 09/14/2021. Repeat every year  Bone Density status: Completed 01/17/2021. Results reflect: Bone density results: OSTEOPOROSIS. Repeat every 2 years.  Lung Cancer Screening: (Low Dose CT Chest recommended if Age 70-80ears, 30 pack-year currently smoking OR have quit w/in 15years.) does not qualify.   Lung Cancer Screening Referral: NA  Additional Screening:  Hepatitis C Screening: does not qualify; Completed 10/11/2016  Vision Screening: Recommended annual ophthalmology exams for early detection of glaucoma and other disorders of the eye. Is the patient up to date with their annual eye exam?  Yes  Who is the provider or what is the name of the office in which the  patient attends annual eye exams? [facility] If pt is not established with a provider, would they like to be referred to a provider to establish care? No .   Dental Screening: Recommended annual dental exams for proper oral hygiene  Community Resource Referral / Chronic Care Management: CRR required this visit?  No   CCM required this visit?  No      Plan:     I have personally reviewed and noted the following in the patient's chart:   Medical and social history Use of alcohol, tobacco or illicit drugs  Current medications and supplements including opioid prescriptions. Patient is not currently taking opioid prescriptions. Functional ability and status Nutritional status Physical activity Advanced directives List of other physicians Hospitalizations, surgeries, and ER visits in previous 12 months Vitals Screenings to include cognitive, depression, and falls Referrals and appointments  In addition, I have reviewed and discussed with patient certain preventive protocols, quality metrics, and best practice recommendations. A written personalized care plan for preventive services as well as general preventive health recommendations were provided to patient.     Ladean Raya, [place]   11/29/2021   Nurse Notes: AWV printed and mailed

## 2022-01-12 ENCOUNTER — Ambulatory Visit: Payer: Medicare Other | Admitting: Family Medicine

## 2022-01-12 ENCOUNTER — Ambulatory Visit (INDEPENDENT_AMBULATORY_CARE_PROVIDER_SITE_OTHER): Payer: Medicare Other | Admitting: Family Medicine

## 2022-01-12 ENCOUNTER — Encounter: Payer: Self-pay | Admitting: Family Medicine

## 2022-01-12 VITALS — BP 128/75 | HR 73 | Temp 97.9°F | Ht 64.0 in | Wt 141.0 lb

## 2022-01-12 DIAGNOSIS — E785 Hyperlipidemia, unspecified: Secondary | ICD-10-CM | POA: Diagnosis not present

## 2022-01-12 DIAGNOSIS — G43919 Migraine, unspecified, intractable, without status migrainosus: Secondary | ICD-10-CM | POA: Diagnosis not present

## 2022-01-12 DIAGNOSIS — M5030 Other cervical disc degeneration, unspecified cervical region: Secondary | ICD-10-CM | POA: Diagnosis not present

## 2022-01-12 DIAGNOSIS — Z23 Encounter for immunization: Secondary | ICD-10-CM | POA: Diagnosis not present

## 2022-01-12 DIAGNOSIS — K219 Gastro-esophageal reflux disease without esophagitis: Secondary | ICD-10-CM | POA: Diagnosis not present

## 2022-01-12 DIAGNOSIS — Z0000 Encounter for general adult medical examination without abnormal findings: Secondary | ICD-10-CM

## 2022-01-12 MED ORDER — ROSUVASTATIN CALCIUM 20 MG PO TABS: 20.0000 mg | ORAL_TABLET | Freq: Every day | ORAL | 3 refills | Status: DC

## 2022-01-12 MED ORDER — PROMETHAZINE HCL 25 MG PO TABS: 25.0000 mg | ORAL_TABLET | Freq: Four times a day (QID) | ORAL | 3 refills | Status: DC | PRN

## 2022-01-12 MED ORDER — DULOXETINE HCL 60 MG PO CPEP: 60.0000 mg | ORAL_CAPSULE | Freq: Two times a day (BID) | ORAL | 3 refills | Status: DC

## 2022-01-12 MED ORDER — ESTRADIOL 0.1 MG/GM VA CREA: 1.0000 | TOPICAL_CREAM | Freq: Every day | VAGINAL | 3 refills | Status: DC

## 2022-01-12 MED ORDER — RIZATRIPTAN BENZOATE 10 MG PO TBDP: 10.0000 mg | ORAL_TABLET | ORAL | 3 refills | Status: DC | PRN

## 2022-01-12 MED ORDER — AJOVY 225 MG/1.5ML ~~LOC~~ SOAJ: 225.0000 mg | SUBCUTANEOUS | 3 refills | Status: DC

## 2022-01-12 MED ORDER — ESOMEPRAZOLE MAGNESIUM 40 MG PO CPDR: 40.0000 mg | DELAYED_RELEASE_CAPSULE | Freq: Every day | ORAL | 3 refills | Status: DC

## 2022-01-12 MED ORDER — DICLOFENAC SODIUM 75 MG PO TBEC: 75.0000 mg | DELAYED_RELEASE_TABLET | Freq: Every day | ORAL | 3 refills | Status: DC | PRN

## 2022-01-12 NOTE — Progress Notes (Signed)
BP 128/75   Pulse 73   Temp 97.9 F (36.6 C)   Ht _0  (1.626 m)   Wt 141 lb (64 kg)   SpO2 96%   BMI 24.20 kg/m    Subjective:   Patient ID: Selena Barnes, female    DOB: 1950/06/29, 71 y.o.   MRN: [id number]  HPI: Selena Barnes is a 71 y.o. female presenting on 01/12/2022 for Medical Management of Chronic Issues and Hyperlipidemia   HPI Physical exam Patient denies any chest pain, shortness of breath, headaches or vision issues, abdominal complaints, diarrhea, nausea, vomiting .   Arthritis Patient has arthritis in her hands and her knees and her hips and sometimes her back and neck and it is not severe but Tylenol is not working for it and she would like to try going back to diclofenac which she had in the past.  Hyperlipidemia Patient is coming in for recheck of his hyperlipidemia. The patient is currently taking Crestor. They deny any issues with myalgias or history of liver damage from it. They deny any focal numbness or weakness or chest pain.   GERD Patient is currently on Nexium.  She denies any major symptoms or abdominal pain or belching or burping. She denies any blood in her stool or lightheadedness or dizziness.   Migraine recheck.  Patient is coming in for migraine recheck.  She currently takes Ajovy and uses Maxalt phentermine for breakthrough headaches.  She feels like it does not very well except for at the end of the month sometimes she starts to get a headache because of fading but other than that she does really well.  Relevant past medical, surgical, family and social history reviewed and updated as indicated. Interim medical history since our last visit reviewed. Allergies and medications reviewed and updated.  Review of Systems  Constitutional:  Negative for chills and fever.  HENT:  Negative for congestion, ear discharge, ear pain and tinnitus.   Eyes:  Negative for pain, redness and visual disturbance.  Respiratory:  Negative for cough, chest  tightness, shortness of breath and wheezing.   Cardiovascular:  Negative for chest pain, palpitations and leg swelling.  Gastrointestinal:  Negative for abdominal pain, blood in stool, constipation and diarrhea.  Genitourinary:  Negative for difficulty urinating, dysuria and hematuria.  Musculoskeletal:  Positive for arthralgias, back pain and neck pain. Negative for gait problem and myalgias.  Skin:  Negative for rash.  Neurological:  Negative for dizziness, weakness, light-headedness and headaches.  Psychiatric/Behavioral:  Negative for agitation, behavioral problems and suicidal ideas.   All other systems reviewed and are negative.   Per HPI unless specifically indicated above   Allergies as of 01/12/2022       Reactions   Sulfa Antibiotics    Sulfacetamide Other (See Comments), Swelling        Medication List        Accurate as of January 12, 2022  1:18 PM. If you have any questions, ask your nurse or doctor.          Ajovy 225 MG/1.5ML Soaj Generic drug: Fremanezumab-vfrm Inject 225 mg into the skin every 30 (thirty) days.   Biotin 1 MG Caps Take by mouth.   Calcium Carb-Cholecalciferol 600-800 MG-UNIT Tabs   Coenzyme Q10 100 MG capsule CoQ-10 100 mg capsule   diclofenac 75 MG EC tablet Commonly known as: VOLTAREN Take 1 tablet (75 mg total) by mouth daily as needed. Started by: Worthy Rancher, MD  DULoxetine 60 MG capsule Commonly known as: CYMBALTA Take 1 capsule (60 mg total) by mouth 2 (two) times daily.   esomeprazole 40 MG capsule Commonly known as: NEXIUM Take 1 capsule (40 mg total) by mouth daily at 12 noon.   estradiol 0.1 MG/GM vaginal cream Commonly known as: ESTRACE VAGINAL Place 1 Applicatorful vaginally at bedtime.   fish oil-omega-3 fatty acids 1000 MG capsule Take 2 g by mouth 2 (two) times daily.   Loratadine 10 MG Caps Take by mouth.   promethazine 25 MG tablet Commonly known as: PHENERGAN Take 1 tablet (25 mg total)  by mouth every 6 (six) hours as needed for nausea or vomiting.   rizatriptan 10 MG disintegrating tablet Commonly known as: Maxalt-MLT Take 1 tablet (10 mg total) by mouth as needed for migraine. May repeat in 2 hours if needed   rosuvastatin 20 MG tablet Commonly known as: Crestor Take 1 tablet (20 mg total) by mouth daily.         Objective:   BP 128/75   Pulse 73   Temp 97.9 F (36.6 C)   Ht _0  (1.626 m)   Wt 141 lb (64 kg)   SpO2 96%   BMI 24.20 kg/m   Wt Readings from Last 3 Encounters:  01/12/22 141 lb (64 kg)  08/18/21 154 lb (69.9 kg)  12/24/20 160 lb (72.6 kg)    Physical Exam Vitals and nursing note reviewed.  Constitutional:      General: She is not in acute distress.    Appearance: She is well-developed. She is not diaphoretic.  HENT:     Right Ear: Tympanic membrane and ear canal normal.     Left Ear: Tympanic membrane and ear canal normal.     Mouth/Throat:     Mouth: Mucous membranes are moist.     Pharynx: Oropharynx is clear. No oropharyngeal exudate or posterior oropharyngeal erythema.  Eyes:     Conjunctiva/sclera: Conjunctivae normal.  Neck:     Thyroid: No thyromegaly.  Cardiovascular:     Rate and Rhythm: Normal rate and regular rhythm.     Heart sounds: Normal heart sounds. No murmur heard. Pulmonary:     Effort: Pulmonary effort is normal. No respiratory distress.     Breath sounds: Normal breath sounds. No wheezing.  Abdominal:     General: Bowel sounds are normal. There is no distension.     Palpations: Abdomen is soft.     Tenderness: There is no abdominal tenderness. There is no guarding or rebound.  Musculoskeletal:        General: No swelling or tenderness. Normal range of motion.     Cervical back: Neck supple.  Lymphadenopathy:     Cervical: No cervical adenopathy.  Skin:    General: Skin is warm and dry.     Findings: No rash.  Neurological:     Mental Status: She is alert and oriented to person, place, and time.      Coordination: Coordination normal.  Psychiatric:        Behavior: Behavior normal.       Assessment & Plan:   Problem List Items Addressed This Visit       Cardiovascular and Mediastinum   Migraines   Relevant Medications   DULoxetine (CYMBALTA) 60 MG capsule   Fremanezumab-vfrm (AJOVY) 225 MG/1.5ML SOAJ   rizatriptan (MAXALT-MLT) 10 MG disintegrating tablet   promethazine (PHENERGAN) 25 MG tablet   rosuvastatin (CRESTOR) 20 MG tablet   diclofenac (VOLTAREN)  75 MG EC tablet   Other Relevant Orders   CBC with Differential/Platelet   CMP14+EGFR   Lipid panel   TSH     Digestive   GERD (gastroesophageal reflux disease)   Relevant Medications   esomeprazole (NEXIUM) 40 MG capsule   Other Relevant Orders   CBC with Differential/Platelet   CMP14+EGFR   Lipid panel   TSH     Musculoskeletal and Integument   Other cervical disc degeneration, unspecified cervical region   Relevant Medications   diclofenac (VOLTAREN) 75 MG EC tablet     Other   Hyperlipidemia with target LDL less than 100   Relevant Medications   rosuvastatin (CRESTOR) 20 MG tablet   Other Relevant Orders   CBC with Differential/Platelet   CMP14+EGFR   Lipid panel   TSH   Other Visit Diagnoses     Well adult exam    -  Primary   Relevant Orders   CBC with Differential/Platelet   CMP14+EGFR   Lipid panel   TSH       Arthritis and muscle aches, will give diclofenac. Follow up plan: Return in about 6 months (around 07/13/2022), or if symptoms worsen or fail to improve, for Recheck cholesterol and blood work and migraines.  Counseling provided for all of the vaccine components Orders Placed This Encounter  Procedures   CBC with Differential/Platelet   CMP14+EGFR   Lipid panel   TSH    Caryl Pina, MD [facility] 01/12/2022, 1:18 PM

## 2022-01-13 LAB — CMP14+EGFR
ALT: 30 IU/L (ref 0–32)
AST: 31 IU/L (ref 0–40)
Albumin/Globulin Ratio: 1.4 (ref 1.2–2.2)
Albumin: 4.2 g/dL (ref 3.9–4.9)
Alkaline Phosphatase: 71 IU/L (ref 44–121)
BUN/Creatinine Ratio: 14 (ref 12–28)
BUN: 9 mg/dL (ref 8–27)
Bilirubin Total: 0.2 mg/dL (ref 0.0–1.2)
CO2: 25 mmol/L (ref 20–29)
Calcium: 9.4 mg/dL (ref 8.7–10.3)
Chloride: 100 mmol/L (ref 96–106)
Creatinine, Ser: 0.66 mg/dL (ref 0.57–1.00)
Globulin, Total: 2.9 g/dL (ref 1.5–4.5)
Glucose: 89 mg/dL (ref 70–99)
Potassium: 4.6 mmol/L (ref 3.5–5.2)
Sodium: 139 mmol/L (ref 134–144)
Total Protein: 7.1 g/dL (ref 6.0–8.5)
eGFR: 94 mL/min/{1.73_m2} (ref >59)

## 2022-01-13 LAB — LIPID PANEL
Chol/HDL Ratio: 2.3 ratio (ref 0.0–4.4)
Cholesterol, Total: 131 mg/dL (ref 100–199)
HDL: 58 mg/dL (ref >39)
LDL Chol Calc (NIH): 57 mg/dL (ref 0–99)
Triglycerides: 85 mg/dL (ref 0–149)
VLDL Cholesterol Cal: 16 mg/dL (ref 5–40)

## 2022-01-13 LAB — CBC WITH DIFFERENTIAL/PLATELET
Basophils Absolute: 0.1 10*3/uL (ref 0.0–0.2)
Basos: 1 %
EOS (ABSOLUTE): 0.2 10*3/uL (ref 0.0–0.4)
Eos: 3 %
Hematocrit: 38.3 % (ref 34.0–46.6)
Hemoglobin: 12.6 g/dL (ref 11.1–15.9)
Immature Grans (Abs): 0 10*3/uL (ref 0.0–0.1)
Immature Granulocytes: 0 %
Lymphocytes Absolute: 2.5 10*3/uL (ref 0.7–3.1)
Lymphs: 38 %
MCH: 30.1 pg (ref 26.6–33.0)
MCHC: 32.9 g/dL (ref 31.5–35.7)
MCV: 92 fL (ref 79–97)
Monocytes Absolute: 0.5 10*3/uL (ref 0.1–0.9)
Monocytes: 8 %
Neutrophils Absolute: 3.4 10*3/uL (ref 1.4–7.0)
Neutrophils: 50 %
Platelets: 243 10*3/uL (ref 150–450)
RBC: 4.18 x10E6/uL (ref 3.77–5.28)
RDW: 12.4 % (ref 11.7–15.4)
WBC: 6.6 10*3/uL (ref 3.4–10.8)

## 2022-01-13 LAB — TSH: TSH: 1.48 u[IU]/mL (ref 0.450–4.500)

## 2022-01-23 ENCOUNTER — Other Ambulatory Visit: Payer: Self-pay | Admitting: Family Medicine

## 2022-01-23 MED ORDER — ESTRADIOL 0.1 MG/GM VA CREA: 1.0000 | TOPICAL_CREAM | VAGINAL | 3 refills | Status: DC

## 2022-01-23 NOTE — Progress Notes (Unsigned)
Updated vaginal cream to maintenance dose

## 2022-07-14 ENCOUNTER — Encounter: Payer: Self-pay | Admitting: Family Medicine

## 2022-07-14 ENCOUNTER — Ambulatory Visit (INDEPENDENT_AMBULATORY_CARE_PROVIDER_SITE_OTHER): Payer: Medicare Other | Admitting: Family Medicine

## 2022-07-14 VITALS — BP 108/69 | HR 90 | Ht 64.0 in | Wt 148.0 lb

## 2022-07-14 DIAGNOSIS — K219 Gastro-esophageal reflux disease without esophagitis: Secondary | ICD-10-CM

## 2022-07-14 DIAGNOSIS — E785 Hyperlipidemia, unspecified: Secondary | ICD-10-CM | POA: Diagnosis not present

## 2022-07-14 DIAGNOSIS — F3342 Major depressive disorder, recurrent, in full remission: Secondary | ICD-10-CM

## 2022-07-14 DIAGNOSIS — G43919 Migraine, unspecified, intractable, without status migrainosus: Secondary | ICD-10-CM

## 2022-07-14 LAB — LIPID PANEL
Chol/HDL Ratio: 2.5 ratio (ref 0.0–4.4)
Cholesterol, Total: 143 mg/dL (ref 100–199)
HDL: 57 mg/dL (ref >39)
LDL Chol Calc (NIH): 69 mg/dL (ref 0–99)
Triglycerides: 92 mg/dL (ref 0–149)
VLDL Cholesterol Cal: 17 mg/dL (ref 5–40)

## 2022-07-14 LAB — CBC WITH DIFFERENTIAL/PLATELET
Basophils Absolute: 0.1 10*3/uL (ref 0.0–0.2)
Basos: 1 %
EOS (ABSOLUTE): 0.2 10*3/uL (ref 0.0–0.4)
Eos: 4 %
Hematocrit: 40 % (ref 34.0–46.6)
Hemoglobin: 13.5 g/dL (ref 11.1–15.9)
Immature Grans (Abs): 0 10*3/uL (ref 0.0–0.1)
Immature Granulocytes: 0 %
Lymphocytes Absolute: 2 10*3/uL (ref 0.7–3.1)
Lymphs: 38 %
MCH: 30.1 pg (ref 26.6–33.0)
MCHC: 33.8 g/dL (ref 31.5–35.7)
MCV: 89 fL (ref 79–97)
Monocytes Absolute: 0.4 10*3/uL (ref 0.1–0.9)
Monocytes: 8 %
Neutrophils Absolute: 2.6 10*3/uL (ref 1.4–7.0)
Neutrophils: 49 %
Platelets: 253 10*3/uL (ref 150–450)
RBC: 4.49 x10E6/uL (ref 3.77–5.28)
RDW: 11.9 % (ref 11.7–15.4)
WBC: 5.3 10*3/uL (ref 3.4–10.8)

## 2022-07-14 LAB — CMP14+EGFR
ALT: 26 IU/L (ref 0–32)
AST: 29 IU/L (ref 0–40)
Albumin/Globulin Ratio: 1.6 (ref 1.2–2.2)
Albumin: 4.4 g/dL (ref 3.8–4.8)
Alkaline Phosphatase: 64 IU/L (ref 44–121)
BUN/Creatinine Ratio: 14 (ref 12–28)
BUN: 10 mg/dL (ref 8–27)
Bilirubin Total: 0.4 mg/dL (ref 0.0–1.2)
CO2: 26 mmol/L (ref 20–29)
Calcium: 9.9 mg/dL (ref 8.7–10.3)
Chloride: 101 mmol/L (ref 96–106)
Creatinine, Ser: 0.69 mg/dL (ref 0.57–1.00)
Globulin, Total: 2.8 g/dL (ref 1.5–4.5)
Glucose: 97 mg/dL (ref 70–99)
Potassium: 4.7 mmol/L (ref 3.5–5.2)
Sodium: 140 mmol/L (ref 134–144)
Total Protein: 7.2 g/dL (ref 6.0–8.5)
eGFR: 93 mL/min/{1.73_m2} (ref >59)

## 2022-07-14 NOTE — Progress Notes (Signed)
BP 108/69   Pulse 90   Ht 5\' 4"  (1.626 m)   Wt 148 lb (67.1 kg)   SpO2 99%   BMI 25.40 kg/m    Subjective:   Patient ID: Selena Barnes, female    DOB: [birth year], 72 y.o.   MRN: [id number]  HPI: Selena Barnes is a 72 y.o. female presenting on 07/14/2022 for Medical Management of Chronic Issues and Hyperlipidemia   HPI Hyperlipidemia Patient is coming in for recheck of his hyperlipidemia. The patient is currently taking Crestor and fish oil. They deny any issues with myalgias or history of liver damage from it. They deny any focal numbness or weakness or chest pain.   GERD Patient is currently on Nexium.  She denies any major symptoms or abdominal pain or belching or burping. She denies any blood in her stool or lightheadedness or dizziness.   Depression and anxiety and migraine recheck Patient currently takes Cymbalta to help with depression and anxiety and that also uses Maxalt and Ajovy for migraines as well.  She still gets some headaches but thinks they are less severe.  She does feel like sometimes her diet plays a factor in there.  She denies any anxiety depression.  She says her biggest issue is sleeping at night and sometimes she has trouble falling asleep and sometimes she has trouble waking up and going back to sleep.  She denies any suicidal ideations or thoughts of hurting self.    07/14/2022    8:03 AM 07/14/2022    8:02 AM 01/12/2022   12:53 PM 11/29/2021   12:06 PM 08/18/2021    8:52 AM  Depression screen PHQ 2/9  Decreased Interest  0 0 0 0  Down, Depressed, Hopeless  0 0 0 0  PHQ - 2 Score  0 0 0 0  Altered sleeping 3  0    Tired, decreased energy 1  0    Change in appetite 1  0    Feeling bad or failure about yourself  0  0    Trouble concentrating 0  0    Moving slowly or fidgety/restless 0  0    Suicidal thoughts 0  0    PHQ-9 Score   0    Difficult doing work/chores Somewhat difficult  Not difficult at all       Relevant past medical, surgical, family  and social history reviewed and updated as indicated. Interim medical history since our last visit reviewed. Allergies and medications reviewed and updated.  Review of Systems  Constitutional:  Negative for chills and fever.  Eyes:  Negative for visual disturbance.  Respiratory:  Negative for chest tightness and shortness of breath.   Cardiovascular:  Negative for chest pain and leg swelling.  Genitourinary:  Negative for difficulty urinating and dysuria.  Musculoskeletal:  Negative for back pain and gait problem.  Skin:  Negative for rash.  Neurological:  Negative for dizziness, light-headedness and headaches.  Psychiatric/Behavioral:  Negative for agitation and behavioral problems.   All other systems reviewed and are negative.   Per HPI unless specifically indicated above   Allergies as of 07/14/2022       Reactions   Sulfa Antibiotics    Sulfacetamide Other (See Comments), Swelling        Medication List        Accurate as of Jul 14, 2022  8:15 AM. If you have any questions, ask your nurse or doctor.  STOP taking these medications    Biotin 1 MG Caps Stopped by: Nils Pyle, MD   Calcium Carb-Cholecalciferol 600-800 MG-UNIT Tabs Stopped by: Elige Radon Estel Scholze, MD   Coenzyme Q10 100 MG capsule Stopped by: Elige Radon Keyonte Cookston, MD       TAKE these medications    Ajovy 225 MG/1.5ML Soaj Generic drug: Fremanezumab-vfrm Inject 225 mg into the skin every 30 (thirty) days.   diclofenac 75 MG EC tablet Commonly known as: VOLTAREN Take 1 tablet (75 mg total) by mouth daily as needed.   DULoxetine 60 MG capsule Commonly known as: CYMBALTA Take 1 capsule (60 mg total) by mouth 2 (two) times daily.   esomeprazole 40 MG capsule Commonly known as: NEXIUM Take 1 capsule (40 mg total) by mouth daily at 12 noon.   estradiol 0.1 MG/GM vaginal cream Commonly known as: ESTRACE VAGINAL Place 1 Applicatorful vaginally 3 (three) times a week.   fish  oil-omega-3 fatty acids 1000 MG capsule Take 2 g by mouth 2 (two) times daily.   Loratadine 10 MG Caps Take by mouth.   promethazine 25 MG tablet Commonly known as: PHENERGAN Take 1 tablet (25 mg total) by mouth every 6 (six) hours as needed for nausea or vomiting.   rizatriptan 10 MG disintegrating tablet Commonly known as: Maxalt-MLT Take 1 tablet (10 mg total) by mouth as needed for migraine. May repeat in 2 hours if needed   rosuvastatin 20 MG tablet Commonly known as: Crestor Take 1 tablet (20 mg total) by mouth daily.         Objective:   BP 108/69   Pulse 90   Ht 5\' 4"  (1.626 m)   Wt 148 lb (67.1 kg)   SpO2 99%   BMI 25.40 kg/m   Wt Readings from Last 3 Encounters:  07/14/22 148 lb (67.1 kg)  01/12/22 141 lb (64 kg)  08/18/21 154 lb (69.9 kg)    Physical Exam Vitals and nursing note reviewed.  Constitutional:      General: She is not in acute distress.    Appearance: She is well-developed. She is not diaphoretic.  Eyes:     Conjunctiva/sclera: Conjunctivae normal.  Cardiovascular:     Rate and Rhythm: Normal rate and regular rhythm.     Heart sounds: Normal heart sounds. No murmur heard. Pulmonary:     Effort: Pulmonary effort is normal. No respiratory distress.     Breath sounds: Normal breath sounds. No wheezing.  Musculoskeletal:        General: No swelling or tenderness. Normal range of motion.  Skin:    General: Skin is warm and dry.     Findings: No rash.  Neurological:     Mental Status: She is alert and oriented to person, place, and time.     Coordination: Coordination normal.  Psychiatric:        Behavior: Behavior normal.       Assessment & Plan:   Problem List Items Addressed This Visit       Cardiovascular and Mediastinum   Migraines     Digestive   GERD (gastroesophageal reflux disease)   Relevant Orders   CBC with Differential/Platelet   CMP14+EGFR   Lipid panel     Other   Hyperlipidemia with target LDL less than  100 - Primary   Relevant Orders   CBC with Differential/Platelet   CMP14+EGFR   Lipid panel   Depression   Relevant Orders   CBC with Differential/Platelet  Recommended for sleep that she can use some Tylenol PM or melatonin and she will try the Tylenol PM or ZzzQuil or something like it. Follow up plan: Return in about 6 months (around 01/13/2023), or if symptoms worsen or fail to improve, for Physical exam and recheck hyperlipidemia.  Counseling provided for all of the vaccine components Orders Placed This Encounter  Procedures   CBC with Differential/Platelet   CMP14+EGFR   Lipid panel    Arville Care, MD [facility] 07/14/2022, 8:15 AM

## 2022-08-10 ENCOUNTER — Other Ambulatory Visit: Payer: Self-pay | Admitting: Family Medicine

## 2022-08-10 DIAGNOSIS — Z1231 Encounter for screening mammogram for malignant neoplasm of breast: Secondary | ICD-10-CM

## 2022-09-01 DIAGNOSIS — D2262 Melanocytic nevi of left upper limb, including shoulder: Secondary | ICD-10-CM | POA: Diagnosis not present

## 2022-09-01 DIAGNOSIS — D485 Neoplasm of uncertain behavior of skin: Secondary | ICD-10-CM | POA: Diagnosis not present

## 2022-09-01 DIAGNOSIS — D1801 Hemangioma of skin and subcutaneous tissue: Secondary | ICD-10-CM | POA: Diagnosis not present

## 2022-09-01 DIAGNOSIS — L814 Other melanin hyperpigmentation: Secondary | ICD-10-CM | POA: Diagnosis not present

## 2022-09-01 DIAGNOSIS — L820 Inflamed seborrheic keratosis: Secondary | ICD-10-CM | POA: Diagnosis not present

## 2022-09-01 DIAGNOSIS — L821 Other seborrheic keratosis: Secondary | ICD-10-CM | POA: Diagnosis not present

## 2022-09-01 DIAGNOSIS — L570 Actinic keratosis: Secondary | ICD-10-CM | POA: Diagnosis not present

## 2022-09-01 DIAGNOSIS — Z85828 Personal history of other malignant neoplasm of skin: Secondary | ICD-10-CM | POA: Diagnosis not present

## 2022-09-18 ENCOUNTER — Ambulatory Visit
Admission: RE | Admit: 2022-09-18 | Discharge: 2022-09-18 | Disposition: A | Discharge status: Home / Self Care | Payer: Medicare Other | Source: Ambulatory Visit | Attending: Family Medicine | Admitting: Family Medicine

## 2022-09-18 DIAGNOSIS — Z1231 Encounter for screening mammogram for malignant neoplasm of breast: Secondary | ICD-10-CM

## 2022-12-01 ENCOUNTER — Ambulatory Visit: Payer: Medicare Other

## 2022-12-01 VITALS — Ht 64.0 in | Wt 160.0 lb

## 2022-12-01 DIAGNOSIS — Z0000 Encounter for general adult medical examination without abnormal findings: Secondary | ICD-10-CM | POA: Diagnosis not present

## 2022-12-01 NOTE — Patient Instructions (Signed)
Ms. Brearley , Thank you for taking time to come for your Medicare Wellness Visit. I appreciate your ongoing commitment to your health goals. Please review the following plan we discussed and let me know if I can assist you in the future.   Referrals/Orders/Follow-Ups/Clinician Recommendations: Aim for 30 minutes of exercise or brisk walking, 6-8 glasses of water, and 5 servings of fruits and vegetables each day.   This is a list of the screening recommended for you and due dates:  Health Maintenance  Topic Date Due   Zoster (Shingles) Vaccine (2 of 2) 08/05/2021   Flu Shot  09/14/2022   COVID-19 Vaccine (4 - 2023-24 season) 10/15/2022   DEXA scan (bone density measurement)  01/28/2023   Medicare Annual Wellness Visit  12/01/2023   Mammogram  09/17/2024   Colon Cancer Screening  07/26/2025   DTaP/Tdap/Td vaccine (2 - Td or Tdap) 11/09/2025   Pneumonia Vaccine  Completed   Hepatitis C Screening  Completed   HPV Vaccine  Aged Out    Advanced directives: (Copy Requested) Please bring a copy of your health care power of attorney and living will to the office to be added to your chart at your convenience.  Next Medicare Annual Wellness Visit scheduled for next year: Yes  Insert Preventive Care attachment Insert FALL PREVENTION attachment if needed

## 2022-12-01 NOTE — Progress Notes (Signed)
Subjective:   TERSA SCHROFF is a 72 y.o. female who presents for Medicare Annual (Subsequent) preventive examination.  Visit Complete: Virtual I connected with  [name] on 12/01/22 by a audio enabled telemedicine application and verified that I am speaking with the correct person using two identifiers.  Patient Location: Home  Provider Location: Home Office  I discussed the limitations of evaluation and management by telemedicine. The patient expressed understanding and agreed to proceed.  Vital Signs: Because this visit was a virtual/telehealth visit, some criteria may be missing or patient reported. Any vitals not documented were not able to be obtained and vitals that have been documented are patient reported.  Patient Medicare AWV questionnaire was completed by the patient on 12/01/2022; I have confirmed that all information answered by patient is correct and no changes since this date.  Cardiac Risk Factors include: advanced age (>59men, >76 women);dyslipidemia;hypertension     Objective:    Today's Vitals   12/01/22 1305  Weight: 160 lb (72.6 kg)  Height: 5\' 4"  (1.626 m)   Body mass index is 27.46 kg/m.     12/01/2022    1:07 PM 11/29/2021   12:07 PM 11/11/2020    9:11 AM 09/16/2019    1:41 PM 10/23/2018    2:42 PM 01/23/2018    1:06 PM 11/27/2016    8:19 AM  Advanced Directives  Does Patient Have a Medical Advance Directive? Yes No;Yes Yes Yes Yes Yes Yes  Type of Estate agent of [place];Living will Healthcare Power of [place];Living will Healthcare Power of [place];Living will  Healthcare Power of [place];Living will  Healthcare Power of [place];Living will  Does patient want to make changes to medical advance directive?  No - Patient declined   No - Patient declined  No - Patient declined  Copy of Healthcare Power of Attorney in Chart? No - copy requested No - copy requested No - copy requested  No - copy requested  Yes  Would patient  like information on creating a medical advance directive?  No - Patient declined         Current Medications (verified) Outpatient Encounter Medications as of 12/01/2022  Medication Sig   diclofenac (VOLTAREN) 75 MG EC tablet Take 1 tablet (75 mg total) by mouth daily as needed.   DULoxetine (CYMBALTA) 60 MG capsule Take 1 capsule (60 mg total) by mouth 2 (two) times daily.   esomeprazole (NEXIUM) 40 MG capsule Take 1 capsule (40 mg total) by mouth daily at 12 noon.   estradiol (ESTRACE VAGINAL) 0.1 MG/GM vaginal cream Place 1 Applicatorful vaginally 3 (three) times a week.   fish oil-omega-3 fatty acids 1000 MG capsule Take 2 g by mouth 2 (two) times daily.    Fremanezumab-vfrm (AJOVY) 225 MG/1.5ML SOAJ Inject 225 mg into the skin every 30 (thirty) days.   Loratadine 10 MG CAPS Take by mouth.   promethazine (PHENERGAN) 25 MG tablet Take 1 tablet (25 mg total) by mouth every 6 (six) hours as needed for nausea or vomiting.   rizatriptan (MAXALT-MLT) 10 MG disintegrating tablet Take 1 tablet (10 mg total) by mouth as needed for migraine. May repeat in 2 hours if needed   rosuvastatin (CRESTOR) 20 MG tablet Take 1 tablet (20 mg total) by mouth daily.   No facility-administered encounter medications on file as of 12/01/2022.    Allergies (verified) Sulfa antibiotics and Sulfacetamide   History: Past Medical History:  Diagnosis Date   Arthritis  Constipation    Depression    GERD (gastroesophageal reflux disease)    Hyperlipidemia    Insomnia    Migraines    Osteopenia    Past Surgical History:  Procedure Laterality Date   ABDOMINAL HYSTERECTOMY     ANAL RECTAL MANOMETRY N/A 10/06/2015   Procedure: ANO RECTAL MANOMETRY;  Surgeon: Iva Boop, MD;  Location: [facility];  Service: Endoscopy;  Laterality: N/A;   BREAST BIOPSY Right    BREAST EXCISIONAL BIOPSY Right    benign   BREAST EXCISIONAL BIOPSY Left 2000   axillary area. lymph node biopsy- benign   CATARACT  EXTRACTION W/ INTRAOCULAR LENS IMPLANT Bilateral 07/27/2016   and 09/27/2016, [facility] in [place]   CATARACT EXTRACTION, BILATERAL     CHOLECYSTECTOMY     knee surgery Left 2019   arthroscopic for meniscus tear   LYMPH NODE BIOPSY Left    left arm   Family History  Problem Relation Age of Onset   Heart disease Mother 33       cause of death   Heart disease Father 35       death   Kidney disease Father    Breast cancer Maternal Aunt    Breast cancer Maternal Aunt    Breast cancer Maternal Aunt    Diabetes Brother    Cancer Brother 70       bone marrow cancer   Diabetes Grandchild        grandson   Social History   Socioeconomic History   Marital status: Married    Spouse name: Alfredo Bach   Number of children: 1   Years of education: 12   Highest education level: 12th grade  Occupational History   Occupation: retired  Tobacco Use   Smoking status: Never   Smokeless tobacco: Never  Vaping Use   Vaping status: Never Used  Substance and Sexual Activity   Alcohol use: No    Alcohol/week: 0.0 standard drinks of alcohol   Drug use: No   Sexual activity: Not Currently  Other Topics Concern   Not on file  Social History Narrative   Lives at home one level with husband Alfredo Bach   Right handed   Caffeine: sometimes a coke zero   Son lives one mile away   Social Determinants of Health   Financial Resource Strain: Low Risk  (12/01/2022)   Overall Financial Resource Strain (CARDIA)    Difficulty of Paying Living Expenses: Not hard at all  Food Insecurity: No Food Insecurity (12/01/2022)   Hunger Vital Sign    Worried About Running Out of Food in the Last Year: Never true    Ran Out of Food in the Last Year: Never true  Transportation Needs: No Transportation Needs (12/01/2022)   PRAPARE - Administrator, Civil Service (Medical): No    Lack of Transportation (Non-Medical): No  Physical Activity: Sufficiently Active (12/01/2022)   Exercise Vital  Sign    Days of Exercise per Week: 5 days    Minutes of Exercise per Session: 30 min  Stress: No Stress Concern Present (12/01/2022)   [employer] of Occupational Health - Occupational Stress Questionnaire    Feeling of Stress : Not at all  Social Connections: Moderately Integrated (12/01/2022)   Social Connection and Isolation Panel [NHANES]    Frequency of Communication with Friends and Family: More than three times a week    Frequency of Social Gatherings with Friends and Family: More than three times  a week    Attends Religious Services: More than 4 times per year    Active Member of Clubs or Organizations: No    Attends Banker Meetings: Never    Marital Status: Married    Tobacco Counseling Counseling given: Not Answered   Clinical Intake:  Pre-visit preparation completed: Yes  Pain : No/denies pain     Nutritional Risks: None Diabetes: No  How often do you need to have someone help you when you read instructions, pamphlets, or other written materials from your doctor or pharmacy?: 1 - Never  Interpreter Needed?: No  Information entered by :: Renie Ora, LPN   Activities of Daily Living    12/01/2022    1:08 PM  In your present state of health, do you have any difficulty performing the following activities:  Hearing? 0  Vision? 0  Difficulty concentrating or making decisions? 0  Walking or climbing stairs? 0  Dressing or bathing? 0  Doing errands, shopping? 0  Preparing Food and eating ? N  Using the Toilet? N  In the past six months, have you accidently leaked urine? N  Do you have problems with loss of bowel control? N  Managing your Medications? N  Managing your Finances? N  Housekeeping or managing your Housekeeping? N    Patient Care Team: Dettinger, Elige Radon, MD as PCP - General (Family Medicine) Iva Boop, MD as Consulting Physician (Gastroenterology) Danella Maiers, [facility] as Pharmacist (Family Medicine)  Indicate  any recent Medical Services you may have received from other than [facility] providers in the past year (date may be approximate).     Assessment:   This is a routine wellness examination for [place].  Hearing/Vision screen Vision Screening - Comments:: Wears rx glasses - up to date with routine eye exams with  Dr.Le    Goals Addressed             This Visit's Progress    DIET - INCREASE WATER INTAKE   On track    Try to drink 6-8 glasses of water daily.       Depression Screen    12/01/2022    1:07 PM 07/14/2022    8:02 AM 01/12/2022   12:53 PM 11/29/2021   12:06 PM 08/18/2021    8:52 AM 12/24/2020   11:35 AM 11/11/2020    9:16 AM  PHQ 2/9 Scores  PHQ - 2 Score 0 0 0 0 0 0 0  PHQ- 9 Score   0        Fall Risk    12/01/2022    1:05 PM 07/14/2022    8:02 AM 01/12/2022   12:53 PM 11/29/2021   12:10 PM 12/24/2020   11:35 AM  Fall Risk   Falls in the past year? 0 0 0 0 0  Number falls in past yr: 0      Injury with Fall? 0      Risk for fall due to : No Fall Risks   No Fall Risks   Follow up Falls prevention discussed   Education provided     MEDICARE RISK AT HOME: Medicare Risk at Home Any stairs in or around the home?: No If so, are there any without handrails?: No Home free of loose throw rugs in walkways, pet beds, electrical cords, etc?: Yes Adequate lighting in your home to reduce risk of falls?: Yes Life alert?: No Use of a cane, walker or w/c?: No Grab bars in the bathroom?:  Yes Shower chair or bench in shower?: Yes Elevated toilet seat or a handicapped toilet?: Yes  TIMED UP AND GO:  Was the test performed?  No    Cognitive Function:    11/14/2016   10:59 AM 11/10/2015    3:46 PM 11/03/2014    3:13 PM  MMSE - Mini Mental State Exam  Orientation to time 5 5 5   Orientation to Place 4 5 5   Registration 3 3 3   Attention/ Calculation 5 5 5   Recall 3 3 3   Language- name 2 objects 2 2 2   Language- repeat 1 1 1   Language- follow 3 step command 3 3 3    Language- read & follow direction 1 1 1   Write a sentence 1 1 1   Copy design 1 1 1   Total score 29 30 30         12/01/2022    1:08 PM 11/29/2021   12:10 PM 10/23/2018    2:47 PM  6CIT Screen  What Year? 0 points 0 points 0 points  What month? 0 points 0 points 0 points  What time? 0 points 0 points 0 points  Count back from 20 0 points 0 points 0 points  Months in reverse 0 points 0 points 0 points  Repeat phrase 0 points 0 points 0 points  Total Score 0 points 0 points 0 points    Immunizations Immunization History  Administered Date(s) Administered   Fluad Quad(high Dose 65+) 11/06/2018, 09/25/2019, 12/17/2019, 12/24/2020, 01/12/2022   Influenza, High Dose Seasonal PF 11/14/2016, 01/18/2018   Influenza,inj,Quad PF,6+ Mos 12/20/2012, 01/22/2014, 11/13/2014, 11/08/2015   Moderna Sars-Covid-2 Vaccination 03/20/2019, 04/18/2019, 12/10/2019   PFIZER SARS-COV-2 Pediatric Vaccination 5-75yrs 04/30/2019   Pneumococcal Conjugate-13 10/11/2016   Pneumococcal Polysaccharide-23 01/18/2018   Tdap 11/10/2015   Zoster Recombinant(Shingrix) 06/10/2021   Zoster, Live 06/20/2013    TDAP status: Up to date  Flu Vaccine status: Due, Education has been provided regarding the importance of this vaccine. Advised may receive this vaccine at local pharmacy or Health Dept. Aware to provide a copy of the vaccination record if obtained from local pharmacy or Health Dept. Verbalized acceptance and understanding.  Pneumococcal vaccine status: Up to date  Covid-19 vaccine status: Completed vaccines  Qualifies for Shingles Vaccine? Yes   Zostavax completed No   Shingrix Completed?: Yes  Screening Tests Health Maintenance  Topic Date Due   Zoster Vaccines- Shingrix (2 of 2) 08/05/2021   INFLUENZA VACCINE  09/14/2022   COVID-19 Vaccine (4 - 2023-24 season) 10/15/2022   DEXA SCAN  01/28/2023   Medicare Annual Wellness (AWV)  12/01/2023   MAMMOGRAM  09/17/2024   Colonoscopy  07/26/2025    DTaP/Tdap/Td (2 - Td or Tdap) 11/09/2025   Pneumonia Vaccine 36+ Years old  Completed   Hepatitis C Screening  Completed   HPV VACCINES  Aged Out    Health Maintenance  Health Maintenance Due  Topic Date Due   Zoster Vaccines- Shingrix (2 of 2) 08/05/2021   INFLUENZA VACCINE  09/14/2022   COVID-19 Vaccine (4 - 2023-24 season) 10/15/2022    Colorectal cancer screening: Type of screening: Colonoscopy. Completed 07/27/2015. Repeat every 10 years  Mammogram status: Completed 09/18/2022. Repeat every year  Bone Density status: Completed 01/27/2021. Results reflect: Bone density results: OSTEOPOROSIS. Repeat every 2 years.  Lung Cancer Screening: (Low Dose CT Chest recommended if Age 48-80 years, 20 pack-year currently smoking OR have quit w/in 15years.) does not qualify.   Lung Cancer Screening Referral: n/a  Additional Screening:  Hepatitis C Screening: does not qualify; Completed 10/11/2016  Vision Screening: Recommended annual ophthalmology exams for early detection of glaucoma and other disorders of the eye. Is the patient up to date with their annual eye exam?  Yes  Who is the provider or what is the name of the office in which the patient attends annual eye exams? Dr.Le  If pt is not established with a provider, would they like to be referred to a provider to establish care? No .   Dental Screening: Recommended annual dental exams for proper oral hygiene   Community Resource Referral / Chronic Care Management: CRR required this visit?  No   CCM required this visit?  No     Plan:     I have personally reviewed and noted the following in the patient's chart:   Medical and social history Use of alcohol, tobacco or illicit drugs  Current medications and supplements including opioid prescriptions. Patient is not currently taking opioid prescriptions. Functional ability and status Nutritional status Physical activity Advanced directives List of other  physicians Hospitalizations, surgeries, and ER visits in previous 12 months Vitals Screenings to include cognitive, depression, and falls Referrals and appointments  In addition, I have reviewed and discussed with patient certain preventive protocols, quality metrics, and best practice recommendations. A written personalized care plan for preventive services as well as general preventive health recommendations were provided to patient.     Lorrene Reid, LPN   [id number]   After Visit Summary: (MyChart) Due to this being a telephonic visit, the after visit summary with patients personalized plan was offered to patient via MyChart   Nurse Notes: none

## 2023-01-10 ENCOUNTER — Ambulatory Visit: Payer: Medicare Other | Admitting: Family Medicine

## 2023-01-10 ENCOUNTER — Encounter: Payer: Self-pay | Admitting: Family Medicine

## 2023-01-10 VITALS — BP 128/73 | HR 85 | Temp 97.2°F | Ht 64.0 in | Wt 152.0 lb

## 2023-01-10 DIAGNOSIS — E785 Hyperlipidemia, unspecified: Secondary | ICD-10-CM

## 2023-01-10 DIAGNOSIS — G43919 Migraine, unspecified, intractable, without status migrainosus: Secondary | ICD-10-CM | POA: Diagnosis not present

## 2023-01-10 DIAGNOSIS — Z0000 Encounter for general adult medical examination without abnormal findings: Secondary | ICD-10-CM | POA: Diagnosis not present

## 2023-01-10 DIAGNOSIS — M5030 Other cervical disc degeneration, unspecified cervical region: Secondary | ICD-10-CM

## 2023-01-10 DIAGNOSIS — Z23 Encounter for immunization: Secondary | ICD-10-CM | POA: Diagnosis not present

## 2023-01-10 DIAGNOSIS — K219 Gastro-esophageal reflux disease without esophagitis: Secondary | ICD-10-CM | POA: Diagnosis not present

## 2023-01-10 LAB — CBC WITH DIFFERENTIAL/PLATELET
Basophils Absolute: 0.1 10*3/uL (ref 0.0–0.2)
Basos: 1 %
EOS (ABSOLUTE): 0.2 10*3/uL (ref 0.0–0.4)
Eos: 4 %
Hematocrit: 40.2 % (ref 34.0–46.6)
Hemoglobin: 12.9 g/dL (ref 11.1–15.9)
Immature Grans (Abs): 0 10*3/uL (ref 0.0–0.1)
Immature Granulocytes: 0 %
Lymphocytes Absolute: 2.3 10*3/uL (ref 0.7–3.1)
Lymphs: 36 %
MCH: 30.6 pg (ref 26.6–33.0)
MCHC: 32.1 g/dL (ref 31.5–35.7)
MCV: 96 fL (ref 79–97)
Monocytes Absolute: 0.5 10*3/uL (ref 0.1–0.9)
Monocytes: 8 %
Neutrophils Absolute: 3.4 10*3/uL (ref 1.4–7.0)
Neutrophils: 51 %
Platelets: 224 10*3/uL (ref 150–450)
RBC: 4.21 x10E6/uL (ref 3.77–5.28)
RDW: 12.6 % (ref 11.7–15.4)
WBC: 6.5 10*3/uL (ref 3.4–10.8)

## 2023-01-10 LAB — CMP14+EGFR
ALT: 27 [IU]/L (ref 0–32)
AST: 31 [IU]/L (ref 0–40)
Albumin: 4.4 g/dL (ref 3.8–4.8)
Alkaline Phosphatase: 62 [IU]/L (ref 44–121)
BUN/Creatinine Ratio: 14 (ref 12–28)
BUN: 10 mg/dL (ref 8–27)
Bilirubin Total: 0.3 mg/dL (ref 0.0–1.2)
CO2: 27 mmol/L (ref 20–29)
Calcium: 9.7 mg/dL (ref 8.7–10.3)
Chloride: 101 mmol/L (ref 96–106)
Creatinine, Ser: 0.7 mg/dL (ref 0.57–1.00)
Globulin, Total: 2.8 g/dL (ref 1.5–4.5)
Glucose: 98 mg/dL (ref 70–99)
Potassium: 4.3 mmol/L (ref 3.5–5.2)
Sodium: 142 mmol/L (ref 134–144)
Total Protein: 7.2 g/dL (ref 6.0–8.5)
eGFR: 92 mL/min/{1.73_m2} (ref >59)

## 2023-01-10 LAB — LIPID PANEL
Chol/HDL Ratio: 2.4 {ratio} (ref 0.0–4.4)
Cholesterol, Total: 138 mg/dL (ref 100–199)
HDL: 58 mg/dL (ref >39)
LDL Chol Calc (NIH): 60 mg/dL (ref 0–99)
Triglycerides: 112 mg/dL (ref 0–149)
VLDL Cholesterol Cal: 20 mg/dL (ref 5–40)

## 2023-01-10 MED ORDER — DICLOFENAC SODIUM 75 MG PO TBEC: 75.0000 mg | DELAYED_RELEASE_TABLET | Freq: Every day | ORAL | 3 refills | Status: DC | PRN

## 2023-01-10 MED ORDER — RIZATRIPTAN BENZOATE 10 MG PO TBDP: 10.0000 mg | ORAL_TABLET | ORAL | 3 refills | Status: DC | PRN

## 2023-01-10 MED ORDER — DULOXETINE HCL 60 MG PO CPEP: 60.0000 mg | ORAL_CAPSULE | Freq: Two times a day (BID) | ORAL | 3 refills | Status: DC

## 2023-01-10 MED ORDER — ROSUVASTATIN CALCIUM 20 MG PO TABS: 20.0000 mg | ORAL_TABLET | Freq: Every day | ORAL | 3 refills | Status: DC

## 2023-01-10 MED ORDER — ESTRADIOL 0.1 MG/GM VA CREA: 1.0000 | TOPICAL_CREAM | VAGINAL | 3 refills | Status: DC

## 2023-01-10 MED ORDER — PROMETHAZINE HCL 25 MG PO TABS: 25.0000 mg | ORAL_TABLET | Freq: Four times a day (QID) | ORAL | 3 refills | Status: AC | PRN

## 2023-01-10 MED ORDER — AJOVY 225 MG/1.5ML ~~LOC~~ SOAJ: 225.0000 mg | SUBCUTANEOUS | 3 refills | Status: DC

## 2023-01-10 MED ORDER — ESOMEPRAZOLE MAGNESIUM 40 MG PO CPDR: 40.0000 mg | DELAYED_RELEASE_CAPSULE | Freq: Every day | ORAL | 3 refills | Status: DC

## 2023-01-10 NOTE — Progress Notes (Signed)
BP 128/73   Pulse 85   Temp (!) 97.2 F (36.2 C) (Temporal)   Ht 5\' 4"  (1.626 m)   Wt 152 lb (68.9 kg)   SpO2 97%   BMI 26.09 kg/m    Subjective:   Patient ID: Selena Barnes, female    DOB: April 25, 1950, 72 y.o.   MRN: [id number]  HPI: Selena Barnes is a 72 y.o. female presenting on 01/10/2023 for Annual Exam   HPI Physical exam Patient is coming today for physical exam. Patient denies any chest pain, shortness of breath, headaches or vision issues, abdominal complaints, diarrhea, nausea, vomiting, or joint issues.   GERD Patient is currently on Nexium.  She denies any major symptoms or abdominal pain or belching or burping. She denies any blood in her stool or lightheadedness or dizziness.   Hyperlipidemia Patient is coming in for recheck of his hyperlipidemia. The patient is currently taking Crestor. They deny any issues with myalgias or history of liver damage from it. They deny any focal numbness or weakness or chest pain.   Migraine recheck Patient is coming in today for migraine recheck.  She currently takes Ajovy for prevention and uses Maxalt and Phenergan for acute needs.  She feels like she does really well except for it has been out of stock so she is a week overdue and she is starting to have some headaches and severe yet.  Patient has been diagnosed vaginal atrophy and currently uses estradiol cream.  She knows it has helped with the vaginal irritation and has backed off to every 3 or 4 days.  She did feel like it initially helped with the bladder issues as well but now that she is backed off she is having some leakage and difficulty holding it.  She is going to try increasing the vaginal cream again for short period and build back up and see how it goes from there.  Relevant past medical, surgical, family and social history reviewed and updated as indicated. Interim medical history since our last visit reviewed. Allergies and medications reviewed and updated.  Review  of Systems  Constitutional:  Negative for chills and fever.  HENT:  Negative for congestion, ear discharge and ear pain.   Eyes:  Negative for redness and visual disturbance.  Respiratory:  Negative for chest tightness and shortness of breath.   Cardiovascular:  Negative for chest pain and leg swelling.  Gastrointestinal:  Negative for abdominal pain.  Genitourinary:  Negative for difficulty urinating, dysuria, vaginal bleeding, vaginal discharge and vaginal pain.  Musculoskeletal:  Negative for back pain and gait problem.  Skin:  Negative for rash.  Neurological:  Negative for dizziness, light-headedness and headaches.  Psychiatric/Behavioral:  Negative for agitation and behavioral problems.   All other systems reviewed and are negative.   Per HPI unless specifically indicated above   Allergies as of 01/10/2023       Reactions   Sulfa Antibiotics    Sulfacetamide Other (See Comments), Swelling        Medication List        Accurate as of January 10, 2023  8:31 AM. If you have any questions, ask your nurse or doctor.          Ajovy 225 MG/1.5ML Soaj Generic drug: Fremanezumab-vfrm Inject 225 mg into the skin every 30 (thirty) days.   diclofenac 75 MG EC tablet Commonly known as: VOLTAREN Take 1 tablet (75 mg total) by mouth daily as needed.   DULoxetine 60 MG  capsule Commonly known as: CYMBALTA Take 1 capsule (60 mg total) by mouth 2 (two) times daily.   esomeprazole 40 MG capsule Commonly known as: NEXIUM Take 1 capsule (40 mg total) by mouth daily at 12 noon.   estradiol 0.1 MG/GM vaginal cream Commonly known as: ESTRACE VAGINAL Place 1 Applicatorful vaginally 3 (three) times a week.   fish oil-omega-3 fatty acids 1000 MG capsule Take 2 g by mouth 2 (two) times daily.   Loratadine 10 MG Caps Take by mouth.   promethazine 25 MG tablet Commonly known as: PHENERGAN Take 1 tablet (25 mg total) by mouth every 6 (six) hours as needed for nausea or  vomiting.   rizatriptan 10 MG disintegrating tablet Commonly known as: Maxalt-MLT Take 1 tablet (10 mg total) by mouth as needed for migraine. May repeat in 2 hours if needed   rosuvastatin 20 MG tablet Commonly known as: Crestor Take 1 tablet (20 mg total) by mouth daily.         Objective:   BP 128/73   Pulse 85   Temp (!) 97.2 F (36.2 C) (Temporal)   Ht 5\' 4"  (1.626 m)   Wt 152 lb (68.9 kg)   SpO2 97%   BMI 26.09 kg/m   Wt Readings from Last 3 Encounters:  01/10/23 152 lb (68.9 kg)  12/01/22 160 lb (72.6 kg)  07/14/22 148 lb (67.1 kg)    Physical Exam Vitals and nursing note reviewed. Exam conducted with a chaperone present.  Constitutional:      General: She is not in acute distress.    Appearance: She is well-developed. She is not diaphoretic.  Eyes:     Conjunctiva/sclera: Conjunctivae normal.     Pupils: Pupils are equal, round, and reactive to light.  Cardiovascular:     Rate and Rhythm: Normal rate and regular rhythm.     Heart sounds: Normal heart sounds. No murmur heard. Pulmonary:     Effort: Pulmonary effort is normal. No respiratory distress.     Breath sounds: Normal breath sounds. No wheezing.  Chest:     Chest wall: Tenderness (Slight tenderness throughout the left breast and towards the axilla of the right breast.) present. No mass, deformity or swelling.  Breasts:    Right: No inverted nipple, mass, nipple discharge or skin change.     Left: No inverted nipple, mass, nipple discharge or skin change.  Musculoskeletal:        General: No tenderness. Normal range of motion.  Skin:    General: Skin is warm and dry.     Findings: No rash.  Neurological:     Mental Status: She is alert and oriented to person, place, and time.     Coordination: Coordination normal.  Psychiatric:        Behavior: Behavior normal.       Assessment & Plan:   Problem List Items Addressed This Visit       Cardiovascular and Mediastinum   Migraines    Relevant Medications   diclofenac (VOLTAREN) 75 MG EC tablet   DULoxetine (CYMBALTA) 60 MG capsule   Fremanezumab-vfrm (AJOVY) 225 MG/1.5ML SOAJ   promethazine (PHENERGAN) 25 MG tablet   rizatriptan (MAXALT-MLT) 10 MG disintegrating tablet   rosuvastatin (CRESTOR) 20 MG tablet   Other Relevant Orders   CBC with Differential/Platelet     Digestive   GERD (gastroesophageal reflux disease)   Relevant Medications   esomeprazole (NEXIUM) 40 MG capsule   Other Relevant Orders   Lipid  panel     Musculoskeletal and Integument   Other cervical disc degeneration, unspecified cervical region   Relevant Medications   diclofenac (VOLTAREN) 75 MG EC tablet     Other   Hyperlipidemia with target LDL less than 100   Relevant Medications   rosuvastatin (CRESTOR) 20 MG tablet   Other Relevant Orders   CBC with Differential/Platelet   CMP14+EGFR   Lipid panel   Other Visit Diagnoses     Physical exam    -  Primary   Relevant Orders   CBC with Differential/Platelet   CMP14+EGFR   Lipid panel       She had recent mammogram, she says she has had this breast tenderness for quite some time.  She can keep up on her mammograms. Follow up plan: Return in about 6 months (around 07/10/2023), or if symptoms worsen or fail to improve, for Hyperlipidemia and GERD.  Counseling provided for all of the vaccine components Orders Placed This Encounter  Procedures   CBC with Differential/Platelet   CMP14+EGFR   Lipid panel    Arville Care, MD [facility] 01/10/2023, 8:31 AM

## 2023-01-22 ENCOUNTER — Telehealth: Payer: Self-pay | Admitting: Neurology

## 2023-01-22 DIAGNOSIS — G43919 Migraine, unspecified, intractable, without status migrainosus: Secondary | ICD-10-CM

## 2023-01-22 NOTE — Addendum Note (Signed)
Addended by: Dorene Sorrow on: 01/22/2023 01:33 PM   Modules accepted: Orders

## 2023-01-22 NOTE — Telephone Encounter (Signed)
Patient is in pain and needs to know where the referral was sent to she would like a call back  regarding this matter patient husband would like the call back

## 2023-01-22 NOTE — Telephone Encounter (Signed)
Pt's husband checking status of referral sent over for migraines. Takes Maxalt 3 times a week but they're not working. Transferred to new patient referrals.

## 2023-01-22 NOTE — Telephone Encounter (Signed)
Spoke with husband. Made aware of referral. Number given to Dr. Trevor Mace office.

## 2023-01-22 NOTE — Telephone Encounter (Signed)
Looks like migraines were discussed at most recent visit. Can PCP place updated referral for pt?  Copied from CRM [id number]. Topic: Referral - Question >> Jan 22, 2023  8:34 AM Dimitri Ped wrote: Reason for CRM: patient is calling cause she still has been having migraines. Patient is requesting a referral to the [facility]

## 2023-01-22 NOTE — Telephone Encounter (Signed)
Referral placed to [facility] with Neurology.

## 2023-01-22 NOTE — Telephone Encounter (Signed)
Yes that is fine, go ahead and place referral to Dr. Daisy Blossom with neurology for migraines

## 2023-01-22 NOTE — Telephone Encounter (Signed)
Pt called to schedule an appt with Dr. Lucia Gaskins. Informed pt has been three years, would need a referral from PCP. Pt verbalized understsand.

## 2023-01-23 ENCOUNTER — Other Ambulatory Visit: Payer: Self-pay

## 2023-01-23 ENCOUNTER — Emergency Department (HOSPITAL_COMMUNITY): Payer: Medicare Other

## 2023-01-23 ENCOUNTER — Encounter (HOSPITAL_COMMUNITY): Payer: Self-pay

## 2023-01-23 ENCOUNTER — Emergency Department (HOSPITAL_COMMUNITY)
Admission: EM | Admit: 2023-01-23 | Discharge: 2023-01-23 | Disposition: A | Discharge status: Home / Self Care | Payer: Medicare Other | Attending: Emergency Medicine | Admitting: Emergency Medicine

## 2023-01-23 DIAGNOSIS — G43901 Migraine, unspecified, not intractable, with status migrainosus: Secondary | ICD-10-CM | POA: Diagnosis not present

## 2023-01-23 DIAGNOSIS — G43909 Migraine, unspecified, not intractable, without status migrainosus: Secondary | ICD-10-CM | POA: Diagnosis not present

## 2023-01-23 DIAGNOSIS — R519 Headache, unspecified: Secondary | ICD-10-CM | POA: Diagnosis not present

## 2023-01-23 DIAGNOSIS — I10 Essential (primary) hypertension: Secondary | ICD-10-CM | POA: Diagnosis not present

## 2023-01-23 LAB — CBC WITH DIFFERENTIAL/PLATELET
Abs Immature Granulocytes: 0.12 10*3/uL — ABNORMAL HIGH (ref 0.00–0.07)
Basophils Absolute: 0.1 10*3/uL (ref 0.0–0.1)
Basophils Relative: 1 %
Eosinophils Absolute: 0.2 10*3/uL (ref 0.0–0.5)
Eosinophils Relative: 2 %
HCT: 39.3 % (ref 36.0–46.0)
Hemoglobin: 13.1 g/dL (ref 12.0–15.0)
Immature Granulocytes: 1 %
Lymphocytes Relative: 20 %
Lymphs Abs: 1.8 10*3/uL (ref 0.7–4.0)
MCH: 30.5 pg (ref 26.0–34.0)
MCHC: 33.3 g/dL (ref 30.0–36.0)
MCV: 91.6 fL (ref 80.0–100.0)
Monocytes Absolute: 0.8 10*3/uL (ref 0.1–1.0)
Monocytes Relative: 9 %
Neutro Abs: 6.2 10*3/uL (ref 1.7–7.7)
Neutrophils Relative %: 67 %
Platelets: 268 10*3/uL (ref 150–400)
RBC: 4.29 MIL/uL (ref 3.87–5.11)
RDW: 12.2 % (ref 11.5–15.5)
WBC: 9.1 10*3/uL (ref 4.0–10.5)
nRBC: 0 % (ref 0.0–0.2)

## 2023-01-23 LAB — COMPREHENSIVE METABOLIC PANEL
ALT: 23 U/L (ref 0–44)
AST: 22 U/L (ref 15–41)
Albumin: 4.2 g/dL (ref 3.5–5.0)
Alkaline Phosphatase: 54 U/L (ref 38–126)
Anion gap: 10 (ref 5–15)
BUN: 10 mg/dL (ref 8–23)
CO2: 26 mmol/L (ref 22–32)
Calcium: 9.2 mg/dL (ref 8.9–10.3)
Chloride: 102 mmol/L (ref 98–111)
Creatinine, Ser: 0.55 mg/dL (ref 0.44–1.00)
GFR, Estimated: >60 mL/min (ref >60)
Glucose, Bld: 116 mg/dL — ABNORMAL HIGH (ref 70–99)
Potassium: 3.6 mmol/L (ref 3.5–5.1)
Sodium: 138 mmol/L (ref 135–145)
Total Bilirubin: 0.5 mg/dL (ref <1.2)
Total Protein: 7.8 g/dL (ref 6.5–8.1)

## 2023-01-23 LAB — TROPONIN I (HIGH SENSITIVITY): Troponin I (High Sensitivity): 4 ng/L (ref <18)

## 2023-01-23 MED ORDER — MORPHINE SULFATE (PF) 2 MG/ML IV SOLN
2.0000 mg | Freq: Once | INTRAVENOUS | Status: AC
Start: 2023-01-23 — End: 2023-01-23
  Administered 2023-01-23: 2 mg via INTRAVENOUS
  Filled 2023-01-23: qty 1

## 2023-01-23 MED ORDER — PROCHLORPERAZINE EDISYLATE 10 MG/2ML IJ SOLN
10.0000 mg | INTRAMUSCULAR | Status: AC
Start: 2023-01-23 — End: 2023-01-23
  Administered 2023-01-23: 10 mg via INTRAVENOUS
  Filled 2023-01-23: qty 2

## 2023-01-23 MED ORDER — DEXAMETHASONE SODIUM PHOSPHATE 10 MG/ML IJ SOLN
10.0000 mg | Freq: Once | INTRAMUSCULAR | Status: AC
  Administered 2023-01-23: 10 mg via INTRAVENOUS
  Filled 2023-01-23: qty 1

## 2023-01-23 MED ORDER — LACTATED RINGERS IV BOLUS: 500.0000 mL | Freq: Once | INTRAVENOUS | Status: DC

## 2023-01-23 MED ORDER — FENTANYL CITRATE PF 50 MCG/ML IJ SOSY
12.5000 ug | PREFILLED_SYRINGE | Freq: Once | INTRAMUSCULAR | Status: AC
Start: 2023-01-23 — End: 2023-01-23
  Administered 2023-01-23: 12.5 ug via INTRAVENOUS
  Filled 2023-01-23: qty 1

## 2023-01-23 MED ORDER — METHOCARBAMOL 1000 MG/10ML IJ SOLN
500.0000 mg | Freq: Once | INTRAMUSCULAR | Status: AC
  Administered 2023-01-23: 500 mg via INTRAVENOUS
  Filled 2023-01-23: qty 10

## 2023-01-23 MED ORDER — LORAZEPAM 2 MG/ML IJ SOLN
0.5000 mg | Freq: Once | INTRAMUSCULAR | Status: AC
  Administered 2023-01-23: 0.5 mg via INTRAVENOUS
  Filled 2023-01-23: qty 1

## 2023-01-23 MED ORDER — SODIUM CHLORIDE 0.9 % IV BOLUS
500.0000 mL | Freq: Once | INTRAVENOUS | Status: AC
  Administered 2023-01-23: 500 mL via INTRAVENOUS

## 2023-01-23 NOTE — ED Triage Notes (Addendum)
Pt states that she has had a migraine x 4 days. Pt states that this is different from her other migraines. Pt states that this one goes into her neck and into her right shoulder. Pt has been unable to sleep. Pt reports taking her medication with no relief.

## 2023-01-23 NOTE — Discharge Instructions (Signed)
Please take your home medications as prescribed Follow-up as previously planned Return to the emergency department if you are having any worsening symptoms or feel that you need to be reevaluated anytime

## 2023-01-23 NOTE — ED Provider Triage Note (Signed)
Emergency Medicine Provider Triage Evaluation Note  Selena Barnes , a 72 y.o. female  was evaluated in triage.  Pt complains of left sided headache that is causing some stiffness into her neck and shoulders since Saturday. No sudden in onset. Reports it feels worse than typical migraine. No fevers or recent FLS. Has some photophobia, but no FLS.   Review of Systems  Positive:  Negative:   Physical Exam  BP (!) 158/83 (BP Location: Left Arm)   Pulse 88   Temp 98.4 F (36.9 C) (Oral)   Resp (!) 22   Ht 5\' 4"  (1.626 m)   Wt 68.9 kg   SpO2 96%   BMI 26.07 kg/m  Gen:   Awake, no distress   Resp:  Normal effort  MSK:   Moves extremities without difficulty  Other:    Medical Decision Making  Medically screening exam initiated at 5:53 AM.  Appropriate orders placed.  Selena Barnes was informed that the remainder of the evaluation will be completed by another provider, this initial triage assessment does not replace that evaluation, and the importance of remaining in the ED until their evaluation is complete.  CT and labs ordered. The patient appears uncomfortable, but in no acute distress.    Achille Rich, [place] 01/23/23 [phone]

## 2023-01-23 NOTE — ED Notes (Signed)
Pt reports sudden onset central chest pain 20 min after morphine admin. EKG performed.

## 2023-01-23 NOTE — ED Provider Notes (Addendum)
Selena Barnes Provider Note   CSN: [id number] Arrival date & time: 01/23/23  3086     History  Chief Complaint  Patient presents with   Migraine    Selena Barnes is a 72 y.o. female.  HPI 72 year old female history of migraine headaches presents today complaining of migraine that has been present for several days.  She reports taking her mood home medications as prescribed.  She states it continues to cause her severe pain.  It is typical of her migraines but the pain is not being treated by her current medications.  She describes the pain is radiating around the side of her head.  She has had some nausea and vomiting with last emesis yesterday.  She reports stiffness of the muscles in her neck.  She denies being on any blood thinners, trauma to her head, fever, chills.  She has had some nasal discharge and bleeding from her right nares.  She has taken her Maxalt several times in the past couple of days.  She takes injections on the third of the month and reports taking her injection this month.     Home Medications Prior to Admission medications   Medication Sig Start Date End Date Taking? Authorizing Provider  diclofenac (VOLTAREN) 75 MG EC tablet Take 1 tablet (75 mg total) by mouth daily as needed. 01/10/23   Dettinger, Elige Radon, MD  DULoxetine (CYMBALTA) 60 MG capsule Take 1 capsule (60 mg total) by mouth 2 (two) times daily. 01/10/23   Dettinger, Elige Radon, MD  esomeprazole (NEXIUM) 40 MG capsule Take 1 capsule (40 mg total) by mouth daily at 12 noon. 01/10/23   Dettinger, Elige Radon, MD  estradiol (ESTRACE VAGINAL) 0.1 MG/GM vaginal cream Place 1 Applicatorful vaginally 3 (three) times a week. 01/10/23   Dettinger, Elige Radon, MD  fish oil-omega-3 fatty acids 1000 MG capsule Take 2 g by mouth 2 (two) times daily.     [provider]  Fremanezumab-vfrm (AJOVY) 225 MG/1.5ML SOAJ Inject 225 mg into the skin every 30 (thirty) days.  01/10/23   Dettinger, Elige Radon, MD  Loratadine 10 MG CAPS Take by mouth.    [provider]  promethazine (PHENERGAN) 25 MG tablet Take 1 tablet (25 mg total) by mouth every 6 (six) hours as needed for nausea or vomiting. 01/10/23   Dettinger, Elige Radon, MD  rizatriptan (MAXALT-MLT) 10 MG disintegrating tablet Take 1 tablet (10 mg total) by mouth as needed for migraine. May repeat in 2 hours if needed 01/10/23   Dettinger, Elige Radon, MD  rosuvastatin (CRESTOR) 20 MG tablet Take 1 tablet (20 mg total) by mouth daily. 01/10/23   Dettinger, Elige Radon, MD      Allergies    Sulfa antibiotics and Sulfacetamide    Review of Systems   Review of Systems  Physical Exam Updated Vital Signs BP (!) 121/55   Pulse 80   Temp 98.5 F (36.9 C) (Oral)   Resp (!) 21   Ht 1.626 m (5\' 4" )   Wt 68.9 kg   SpO2 92%   BMI 26.07 kg/m  Physical Exam Vitals reviewed.  HENT:     Head: Normocephalic.     Right Ear: External ear normal.     Left Ear: External ear normal.     Nose: Nose normal.     Mouth/Throat:     Pharynx: Oropharynx is clear.  Eyes:     Extraocular Movements: Extraocular movements intact.  Pupils: Pupils are equal, round, and reactive to light.  Neck:     Comments: Some pain with flexion, extension, lateral movement. There is some paraspinal muscle tenderness No redness or swelling. Cardiovascular:     Rate and Rhythm: Normal rate and regular rhythm.     Pulses: Normal pulses.  Pulmonary:     Effort: Pulmonary effort is normal.  Abdominal:     General: Abdomen is flat.  Musculoskeletal:        General: Normal range of motion.     Cervical back: Normal range of motion.  Skin:    General: Skin is warm.     Capillary Refill: Capillary refill takes less than 2 seconds.  Neurological:     General: No focal deficit present.     Mental Status: She is alert.     Cranial Nerves: No cranial nerve deficit.     Sensory: No sensory deficit.     Motor: No weakness.      Coordination: Coordination normal.     ED Results / Procedures / Treatments   Labs (all labs ordered are listed, but only abnormal results are displayed) Labs Reviewed  CBC WITH DIFFERENTIAL/PLATELET - Abnormal; Notable for the following components:      Result Value   Abs Immature Granulocytes 0.12 (*)    All other components within normal limits  COMPREHENSIVE METABOLIC PANEL - Abnormal; Notable for the following components:   Glucose, Bld 116 (*)    All other components within normal limits  TROPONIN I (HIGH SENSITIVITY)    EKG EKG Interpretation Date/Time:  Tuesday January 23 2023 12:08:05 EST Ventricular Rate:  71 PR Interval:  138 QRS Duration:  95 QT Interval:  404 QTC Calculation: 439 R Axis:   -5  Text Interpretation: Sinus rhythm Confirmed by Margarita Grizzle [phone]) on 01/23/2023 12:36:27 PM  Radiology CT Head Wo Contrast  Result Date: 01/23/2023 CLINICAL DATA:  Headache with increased frequency or severity. EXAM: CT HEAD WITHOUT CONTRAST TECHNIQUE: Contiguous axial images were obtained from the base of the skull through the vertex without intravenous contrast. RADIATION DOSE REDUCTION: This exam was performed according to the departmental dose-optimization program which includes automated exposure control, adjustment of the mA and/or kV according to patient size and/or use of iterative reconstruction technique. COMPARISON:  10/04/2006 brain MRI report FINDINGS: Brain: No evidence of acute infarction, hemorrhage, hydrocephalus, extra-axial collection or mass lesion/mass effect. Patchy chronic infarction in the bilateral cerebellum. Small area of posterior left temporal cortical encephalomalacia, presumably also ischemic. Low-density in the cerebral white matter without specific pattern, likely chronic microvascular ischemia. These findings were described on 2008 brain MRI. Age normal brain volume Vascular: No hyperdense vessel or unexpected calcification. Skull: Normal.  Negative for fracture or focal lesion. Sinuses/Orbits: Negative IMPRESSION: 1. No acute finding. 2. 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 Electronically Signed   By: Tiburcio Pea M.D.   On: 01/23/2023 06:29    Procedures Procedures    Medications Ordered in ED Medications  lactated ringers bolus 500 mL (500 mLs Intravenous Not Given 01/23/23 1052)  LORazepam (ATIVAN) injection 0.5 mg (has no administration in time range)  dexamethasone (DECADRON) injection 10 mg (10 mg Intravenous Given 01/23/23 1052)  methocarbamol (ROBAXIN) injection 500 mg (500 mg Intravenous Given 01/23/23 1052)  prochlorperazine (COMPAZINE) injection 10 mg (10 mg Intravenous Given 01/23/23 1052)  fentaNYL (SUBLIMAZE) injection 12.5 mcg (12.5 mcg Intravenous Given 01/23/23 1051)  sodium chloride  0.9 % bolus 500 mL (0 mLs Intravenous Stopped 01/23/23 1128)  morphine (PF) 2 MG/ML injection 2 mg (2 mg Intravenous Given 01/23/23 1144)    ED Course/ Medical Decision Making/ A&P Clinical Course as of 01/23/23 1317  Tue Jan 23, 2023  1134 Mildly better.  Continues to have some neck pain which does appear to be tender over the paraspinal muscles [DR]  1316 CBC reviewed interpreted and within normal limits complete metabolic panel is reviewed interpreted and is significant for mildly elevated glucose at 116 otherwise within normal limits [DR]    Clinical Course User Index [DR] Margarita Grizzle, MD                                 Medical Decision Making Amount and/or Complexity of Data Reviewed ECG/medicine tests: ordered.  Risk Prescription drug management.   Patient presents today with headache from right side of head down through her neck. Differential diagnosis includes but is not limited to patient's chronic migraine headaches, tension headache, infection, intra cerebral hemorrhage. Pt HA treated and improved while in ED.  Presentation is like  pts typical HA and non concerning for [facility], [facility], Meningitis, or temporal arteritis. Pt is afebrile with no focal neuro deficits, nuchal rigidity, or change in vision.  Patient does have some associated neck pain.  However, this does appear to be reproduced by palpation of muscles.  There has been no recent trauma. Patient had head CT that shows no acute abnormalities. Patient received multiple medications here and pain is improving.  Patient is awake and alert. We have discussed return precautions, ongoing symptom management outpatient, and need for follow-up. Patient and husband voiced understanding of plan.       Final Clinical Impression(s) / ED Diagnoses Final diagnoses:  Migraine with status migrainosus, not intractable, unspecified migraine type    Rx / DC Orders ED Discharge Orders     None         Margarita Grizzle, MD 01/23/23 1317    Margarita Grizzle, MD 01/23/23 1319

## 2023-01-25 DIAGNOSIS — M9902 Segmental and somatic dysfunction of thoracic region: Secondary | ICD-10-CM | POA: Diagnosis not present

## 2023-01-25 DIAGNOSIS — M9901 Segmental and somatic dysfunction of cervical region: Secondary | ICD-10-CM | POA: Diagnosis not present

## 2023-01-25 DIAGNOSIS — M62830 Muscle spasm of back: Secondary | ICD-10-CM | POA: Diagnosis not present

## 2023-01-25 DIAGNOSIS — M9903 Segmental and somatic dysfunction of lumbar region: Secondary | ICD-10-CM | POA: Diagnosis not present

## 2023-01-29 ENCOUNTER — Telehealth: Payer: Self-pay | Admitting: *Deleted

## 2023-01-29 DIAGNOSIS — M9902 Segmental and somatic dysfunction of thoracic region: Secondary | ICD-10-CM | POA: Diagnosis not present

## 2023-01-29 DIAGNOSIS — M9901 Segmental and somatic dysfunction of cervical region: Secondary | ICD-10-CM | POA: Diagnosis not present

## 2023-01-29 DIAGNOSIS — M62830 Muscle spasm of back: Secondary | ICD-10-CM | POA: Diagnosis not present

## 2023-01-29 DIAGNOSIS — M9903 Segmental and somatic dysfunction of lumbar region: Secondary | ICD-10-CM | POA: Diagnosis not present

## 2023-01-29 NOTE — Progress Notes (Signed)
  Care Coordination   Note   01/29/2023 Name: Selena Barnes MRN: [id number] DOB: August 16, 1950  Selena Barnes is a 72 y.o. year old female who sees Dettinger, Elige Radon, MD for primary care. I reached out to [employer] by phone today to offer care coordination services.  [name] was given information about Care Coordination services today including:   The Care Coordination services include support from the care team which includes your Nurse Coordinator, Clinical Social Worker, or Pharmacist.  The Care Coordination team is here to help remove barriers to the health concerns and goals most important to you. Care Coordination services are voluntary, and the patient may decline or stop services at any time by request to their care team member.   Care Coordination Consent Status: Patient agreed to services and verbal consent obtained.   Follow up plan:  Telephone appointment with care coordination team member scheduled for:  02/05/23  Encounter Outcome:  Patient Scheduled [facility] Coordination Care Guide  Direct Dial: [phone]

## 2023-01-30 DIAGNOSIS — M9902 Segmental and somatic dysfunction of thoracic region: Secondary | ICD-10-CM | POA: Diagnosis not present

## 2023-01-30 DIAGNOSIS — M9901 Segmental and somatic dysfunction of cervical region: Secondary | ICD-10-CM | POA: Diagnosis not present

## 2023-01-30 DIAGNOSIS — M62830 Muscle spasm of back: Secondary | ICD-10-CM | POA: Diagnosis not present

## 2023-01-30 DIAGNOSIS — M9903 Segmental and somatic dysfunction of lumbar region: Secondary | ICD-10-CM | POA: Diagnosis not present

## 2023-02-01 DIAGNOSIS — M9902 Segmental and somatic dysfunction of thoracic region: Secondary | ICD-10-CM | POA: Diagnosis not present

## 2023-02-01 DIAGNOSIS — M9903 Segmental and somatic dysfunction of lumbar region: Secondary | ICD-10-CM | POA: Diagnosis not present

## 2023-02-01 DIAGNOSIS — M62830 Muscle spasm of back: Secondary | ICD-10-CM | POA: Diagnosis not present

## 2023-02-01 DIAGNOSIS — M9901 Segmental and somatic dysfunction of cervical region: Secondary | ICD-10-CM | POA: Diagnosis not present

## 2023-02-05 ENCOUNTER — Encounter: Payer: Self-pay | Admitting: *Deleted

## 2023-02-05 ENCOUNTER — Ambulatory Visit: Payer: Self-pay | Admitting: *Deleted

## 2023-02-05 DIAGNOSIS — M9903 Segmental and somatic dysfunction of lumbar region: Secondary | ICD-10-CM | POA: Diagnosis not present

## 2023-02-05 DIAGNOSIS — M9902 Segmental and somatic dysfunction of thoracic region: Secondary | ICD-10-CM | POA: Diagnosis not present

## 2023-02-05 DIAGNOSIS — M62830 Muscle spasm of back: Secondary | ICD-10-CM | POA: Diagnosis not present

## 2023-02-05 DIAGNOSIS — M9901 Segmental and somatic dysfunction of cervical region: Secondary | ICD-10-CM | POA: Diagnosis not present

## 2023-02-05 NOTE — Patient Outreach (Signed)
Care Coordination   Initial Visit Note   02/05/2023  Name: Selena Barnes MRN: [id number] DOB: [birth year]  Selena Barnes is a 72 y.o. year old female who sees Dettinger, Elige Radon, MD for primary care. I spoke with Selena Barnes by phone today.  What matters to the patients health and wellness today?  EMMI - Reduce & Manage Symptoms of Sadness.   Goals Addressed               This Visit's Progress     EMMI - Reduce & Manage Symptoms of Sadness. 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, Hyperlipidemia, Depression & Insomnia.]  General Interventions   General Interventions Discussed/Reviewed General Interventions Discussed, Labs, Vaccines, Doctor Visits, Communication with, Level of Care, [employer], General Interventions Reviewed, Horticulturist, commercial (DME), Annual Eye Exam, Annual Foot Exam, Health Screening, Lipid Profile  [Encouraged Routine Engagement with Care Team Members.]  Labs Hgb A1c every 3 months, Kidney Function  [Encouraged Routine Labwork.]  Vaccines COVID-19, Flu, Pneumonia, RSV, Shingles, Tetanus/Pertussis/Diphtheria  [Encouraged Annual Vaccinations.]  Doctor Visits Discussed/Reviewed Doctor Visits Discussed, Specialist, Doctor Visits Reviewed, Annual Wellness Visits, PCP  [Encouraged Routine Engagement with Care Team Members.]  Health Screening Bone Density, Colonoscopy, Mammogram  [Encouraged Annual Health Screenings.]  Durable Medical Equipment (DME) --  [None]  PCP/Specialist Visits Compliance with follow-up visit  [Encouraged Routine Engagement with Care Team Members.]  Communication with PCP/Specialists, RN, Pharmacists, Social Work  [employer] Routine Engagement with Care Team Members.]  Level of Care Adult Daycare,  Air traffic controller, Assisted Living, Skilled Pharmacologist, Personal Care Services  [Confirmed Disinterest in Applying for Medicaid & Personal Care Services.]  Exercise Interventions   Exercise Discussed/Reviewed Exercise Discussed, Assistive device use and maintanence, Exercise Reviewed, Physical Activity, Weight Managment  [Encouraged Daily Exercise Regimen, as Tolerated.]  Physical Activity Discussed/Reviewed Physical Activity Discussed, Home Exercise Program (HEP), PREP, Physical Activity Reviewed, Gym, Types of exercise  [Encouraged Increased Level of Activity & Exercise. Encouraged Increased Engagement in Activities of Interest, Inside & Outside the Home.]  Weight Management Weight loss  [Encouraged Healthy Weight Loss Program.]  Education Interventions   Education Provided Provided Therapist, sports, Provided Web-based Education, Provided Education  [employer] Reviewed Educational Material & Entertained Questions to Ensure Understanding.]  Provided Verbal Education On Nutrition, Mental Health/Coping with Illness, When to see the doctor, Foot Care, Eye Care, Labs, Applications, Exercise, Medication, Development worker, community, [employer]  [employer] Consideration of Educational Material Reviewed & Encouraged Implementation.]  Labs Reviewed Hgb A1c  [Reviewed.]  Applications Medicaid, Personal Care Services  [Confirmed Disinterest in Applying for Medicaid & Personal Care Services.]  Mental Health Interventions   Mental Health Discussed/Reviewed Mental Health Discussed, Anxiety, Depression, Mental Health Reviewed, Grief and Loss, Substance Abuse, Coping Strategies, Suicide, Crisis, Other  [Assessed Mental Health & Cognitive Status.]  Nutrition Interventions   Nutrition Discussed/Reviewed Nutrition Discussed, Adding fruits and vegetables, Nutrition Reviewed, Fluid intake, Carbohydrate meal planning, Portion sizes, Decreasing sugar intake, Increasing proteins, Decreasing fats,  Decreasing salt  [Encouraged Heart-Healthy, Low Sodium, Reduced Fat, Reduced Sugar Diet.]  Pharmacy Interventions   Pharmacy Dicussed/Reviewed Pharmacy Topics Discussed, Medications and their functions, Medication Adherence, Pharmacy Topics Reviewed, Affording Medications  [Confirmed Ability to Afford Prescription Medications.]  Medication Adherence --  [facility] Prescription Medication  Compliance.]  Safety Interventions   Safety Discussed/Reviewed Safety Discussed, Safety Reviewed  [Encouraged Consideration of Home Safety Evaluation.]  Advanced Directive Interventions   Advanced Directives Discussed/Reviewed Advanced Directives Discussed, Advanced Directives Reviewed, Advanced Care Planning  [Encouraged Initiation of Advanced Directives (Living Will & Healthcare Power of Attorney Documents), Agreeing to [employer], Assist with Completion, Make Copies & Scan into Electronic Medical Record in Epic.]        Assessed Social Determinant of Health Barriers. Discussed Plans for Ongoing Care Management Follow Up. Provided Careers information officer Information for Care Management Team Members. Screened for Signs & Symptoms of Depression, Related to Chronic Disease State.  PHQ2 & PHQ9 Depression Screen Completed & Results Reviewed.  Suicidal Ideation & Homicidal Ideation Assessed - None Present.   Domestic Violence Assessed - None Present. Access to Weapons Assessed - None Present.   Active Listening & Reflection Utilized.  Verbalization of Feelings Encouraged.  Emotional Support Provided. Feelings of Caregiver Burnout Validated. Caregiver Stress Acknowledged. Caregiver Resources Reviewed. Caregiver Support Groups Mailed. Self-Enrollment in Caregiver Support Group of Interest Emphasized. Crisis Support Information, Agencies, Services & Resources Discussed. Problem Solving Interventions Identified. Task-Centered Solutions Implemented.   Solution-Focused Strategies Developed. Acceptance & Commitment  Therapy Introduced. Brief Cognitive Behavioral Therapy Initiated. Client-Centered Therapy Enacted. Reviewed Prescription Medications & Discussed Importance of Compliance. Quality of Sleep Assessed & Sleep Hygiene Techniques Promoted. Encouraged Routine Engagement with Danford Bad, Licensed Clinical Social Worker with [facility] [phone]), if You Have Questions, Need Assistance, or If Additional Social Work Needs Are Identified Between Now & Our Next Follow-Up Outreach Call, Scheduled on 02/22/2023 at 12:30 PM.         SDOH assessments and interventions completed:  Yes.  SDOH Interventions Today    Flowsheet Row Most Recent Value  SDOH Interventions   Food Insecurity Interventions Intervention Not Indicated  Housing Interventions Intervention Not Indicated  Transportation Interventions Intervention Not Indicated, Patient Resources (Friends/Family)  Utilities Interventions Intervention Not Indicated  Alcohol Usage Interventions Intervention Not Indicated (Score <7)  Financial Strain Interventions Intervention Not Indicated  Physical Activity Interventions Intervention Not Indicated  Stress Interventions Intervention Not Indicated  Social Connections Interventions Intervention Not Indicated  Health Literacy Interventions Intervention Not Indicated     Care Coordination Interventions:  Yes, provided.   Follow up plan: Follow up call scheduled for 02/22/2023 at 12:30 pm.  Encounter Outcome:  Patient Visit Completed.   Danford Bad, BSW, MSW, Printmaker Social Work Case Set designer Health  [facility], Population Health Direct Dial: [phone]  Fax: [phone] Email: [email or web address]

## 2023-02-05 NOTE — Patient Instructions (Signed)
Visit Information  Thank you for taking time to visit with me today. Please don't hesitate to contact me if I can be of assistance to you.   Following are the goals we discussed today:   Goals Addressed               This Visit's Progress     EMMI - Reduce & Manage Symptoms of Sadness. 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, Hyperlipidemia, Depression & Insomnia.]  General Interventions   General Interventions Discussed/Reviewed General Interventions Discussed, Labs, Vaccines, Doctor Visits, Communication with, Level of Care, [facility], General Interventions Reviewed, Horticulturist, commercial (DME), Annual Eye Exam, Annual Foot Exam, Health Screening, Lipid Profile  [Encouraged Routine Engagement with Care Team Members.]  Labs Hgb A1c every 3 months, Kidney Function  [Encouraged Routine Labwork.]  Vaccines COVID-19, Flu, Pneumonia, RSV, Shingles, Tetanus/Pertussis/Diphtheria  [Encouraged Annual Vaccinations.]  Doctor Visits Discussed/Reviewed Doctor Visits Discussed, Specialist, Doctor Visits Reviewed, Annual Wellness Visits, PCP  [Encouraged Routine Engagement with Care Team Members.]  Health Screening Bone Density, Colonoscopy, Mammogram  [Encouraged Annual Health Screenings.]  Durable Medical Equipment (DME) --  [None]  PCP/Specialist Visits Compliance with follow-up visit  [Encouraged Routine Engagement with Care Team Members.]  Communication with PCP/Specialists, RN, Pharmacists, Social Work  [employer] Routine Engagement with Care Team Members.]  Level of Care Adult Daycare, Air traffic controller, Assisted Living, Skilled Pharmacologist, Personal Care Services  [Confirmed Disinterest in Applying for Medicaid & Personal Care  Services.]  Exercise Interventions   Exercise Discussed/Reviewed Exercise Discussed, Assistive device use and maintanence, Exercise Reviewed, Physical Activity, Weight Managment  [Encouraged Daily Exercise Regimen, as Tolerated.]  Physical Activity Discussed/Reviewed Physical Activity Discussed, Home Exercise Program (HEP), PREP, Physical Activity Reviewed, Gym, Types of exercise  [Encouraged Increased Level of Activity & Exercise. Encouraged Increased Engagement in Activities of Interest, Inside & Outside the Home.]  Weight Management Weight loss  [Encouraged Healthy Weight Loss Program.]  Education Interventions   Education Provided Provided Therapist, sports, Provided Web-based Education, Provided Education  [employer] Reviewed Educational Material & Entertained Questions to Ensure Understanding.]  Provided Verbal Education On Nutrition, Mental Health/Coping with Illness, When to see the doctor, Foot Care, Eye Care, Labs, Applications, Exercise, Medication, Development worker, community, [employer]  [employer] Consideration of Educational Material Reviewed & Encouraged Implementation.]  Labs Reviewed Hgb A1c  [Reviewed.]  Applications Medicaid, Personal Care Services  [Confirmed Disinterest in Applying for Medicaid & Personal Care Services.]  Mental Health Interventions   Mental Health Discussed/Reviewed Mental Health Discussed, Anxiety, Depression, Mental Health Reviewed, Grief and Loss, Substance Abuse, Coping Strategies, Suicide, Crisis, Other  [Assessed Mental Health & Cognitive Status.]  Nutrition Interventions   Nutrition Discussed/Reviewed Nutrition Discussed, Adding fruits and vegetables, Nutrition Reviewed, Fluid intake, Carbohydrate meal planning, Portion sizes, Decreasing sugar intake, Increasing proteins, Decreasing fats, Decreasing salt  [Encouraged Heart-Healthy, Low Sodium, Reduced Fat, Reduced Sugar Diet.]  Pharmacy Interventions   Pharmacy Dicussed/Reviewed Pharmacy Topics Discussed,  Medications and their functions, Medication Adherence, Pharmacy Topics Reviewed, Affording Medications  [Confirmed Ability to Afford Prescription Medications.]  Medication Adherence --  [Confirmed Prescription Medication Compliance.]  Safety Interventions   Safety Discussed/Reviewed Safety Discussed, Safety Reviewed  [Encouraged Consideration of Home Safety Evaluation.]  Advanced Directive Interventions   Advanced Directives Discussed/Reviewed Advanced Directives Discussed,  Advanced Directives Reviewed, Advanced Care Planning  [Encouraged Initiation of Advanced Directives (Living Will & Healthcare Power of Attorney Documents), Agreeing to [employer], Assist with Completion, Make Copies & Scan into Electronic Medical Record in Epic.]        Assessed Social Determinant of Health Barriers. Discussed Plans for Ongoing Care Management Follow Up. Provided Careers information officer Information for Care Management Team Members. Screened for Signs & Symptoms of Depression, Related to Chronic Disease State.  PHQ2 & PHQ9 Depression Screen Completed & Results Reviewed.  Suicidal Ideation & Homicidal Ideation Assessed - None Present.   Domestic Violence Assessed - None Present. Access to Weapons Assessed - None Present.   Active Listening & Reflection Utilized.  Verbalization of Feelings Encouraged.  Emotional Support Provided. Feelings of Caregiver Burnout Validated. Caregiver Stress Acknowledged. Caregiver Resources Reviewed. Caregiver Support Groups Mailed. Self-Enrollment in Caregiver Support Group of Interest Emphasized. Crisis Support Information, Agencies, Services & Resources Discussed. Problem Solving Interventions Identified. Task-Centered Solutions Implemented.   Solution-Focused Strategies Developed. Acceptance & Commitment Therapy Introduced. Brief Cognitive Behavioral Therapy Initiated. Client-Centered Therapy Enacted. Reviewed Prescription Medications & Discussed Importance of  Compliance. Quality of Sleep Assessed & Sleep Hygiene Techniques Promoted. Encouraged Routine Engagement with Danford Bad, Licensed Clinical Social Worker with [facility] [phone]), if You Have Questions, Need Assistance, or If Additional Social Work Needs Are Identified Between Now & Our Next Follow-Up Outreach Call, Scheduled on 02/22/2023 at 12:30 PM.       Our next appointment is by telephone on 02/22/2023 at 12:30 pm.  Please call the care guide team at [phone] if you need to cancel or reschedule your appointment.   If you are experiencing a Mental Health or Behavioral Health Crisis or need someone to talk to, please call the Suicide and Crisis Lifeline: 988 call the [place] National Suicide Prevention Lifeline: (820)189-6202 or TTY: 360-646-7711 TTY 971-507-7301) to talk to a trained counselor call 1-800-273-TALK (toll free, 24 hour hotline) go to [facility] [address], [place] [phone]) call the [facility] Crisis Line: [phone] call 911  Patient verbalizes understanding of instructions and care plan provided today and agrees to view in MyChart. Active MyChart status and patient understanding of how to access instructions and care plan via MyChart confirmed with patient.     Telephone follow up appointment with care management team member scheduled for:  02/22/2023 at 12:30 pm.  Danford Bad, BSW, MSW, LCSW  Embedded Practice Social Work Case Manager  [facility], Population Health Direct Dial: [phone]  Fax: [phone] Email: [email or web address]

## 2023-02-13 ENCOUNTER — Encounter: Payer: Self-pay | Admitting: Neurology

## 2023-02-13 ENCOUNTER — Ambulatory Visit (INDEPENDENT_AMBULATORY_CARE_PROVIDER_SITE_OTHER): Payer: Medicare Other | Admitting: Neurology

## 2023-02-13 VITALS — BP 128/74 | HR 72 | Ht 64.0 in | Wt 155.0 lb

## 2023-02-13 DIAGNOSIS — G43711 Chronic migraine without aura, intractable, with status migrainosus: Secondary | ICD-10-CM

## 2023-02-13 DIAGNOSIS — G4484 Primary exertional headache: Secondary | ICD-10-CM | POA: Diagnosis not present

## 2023-02-13 DIAGNOSIS — R519 Headache, unspecified: Secondary | ICD-10-CM

## 2023-02-13 DIAGNOSIS — G8929 Other chronic pain: Secondary | ICD-10-CM

## 2023-02-13 DIAGNOSIS — H539 Unspecified visual disturbance: Secondary | ICD-10-CM

## 2023-02-13 DIAGNOSIS — R51 Headache with orthostatic component, not elsewhere classified: Secondary | ICD-10-CM | POA: Diagnosis not present

## 2023-02-13 NOTE — Patient Instructions (Signed)
 Start Vyepti  Start Botox  Give samples of Nurtec to take every day to bridge Prescribe nerivio MRi of the brain w/wo contrast   Eptinezumab  Injection What is this medication? EPTINEZUMAB  (EP ti NEZ ue mab) prevents migraines. It works by blocking a substance in the body that causes migraines. It is a monoclonal antibody. This medicine may be used for other purposes; ask your health care provider or pharmacist if you have questions. COMMON BRAND NAME(S): Vyepti  What should I tell my care team before I take this medication? They need to know if you have any of these conditions: An unusual or allergic reaction to eptinezumab , other medications, foods, dyes, or preservatives Pregnant or trying to get pregnant Breast-feeding How should I use this medication? This medication is injected into a vein. It is given by your care team in a hospital or clinic setting. Talk to your care team about the use of this medication in children. Special care may be needed. Overdosage: If you think you have taken too much of this medicine contact a poison control center or emergency room at once. NOTE: This medicine is only for you. Do not share this medicine with others. What if I miss a dose? Keep appointments for follow-up doses. It is important not to miss your dose. Call your care team if you are unable to keep an appointment. What may interact with this medication? Interactions are not expected. This list may not describe all possible interactions. Give your health care provider a list of all the medicines, herbs, non-prescription drugs, or dietary supplements you use. Also tell them if you smoke, drink alcohol, or use illegal drugs. Some items may interact with your medicine. What should I watch for while using this medication? Your condition will be monitored carefully while you are receiving this medication. What side effects may I notice from receiving this medication? Side effects that you should  report to your care team as soon as possible: Allergic reactions or angioedema--skin rash, itching or hives, swelling of the face, eyes, lips, tongue, arms, or legs, trouble swallowing or breathing Side effects that usually do not require medical attention (report to your care team if they continue or are bothersome): Runny or stuffy nose This list may not describe all possible side effects. Call your doctor for medical advice about side effects. You may report side effects to FDA at 1-800-FDA-1088. Where should I keep my medication? This medication is given in a hospital or clinic. It will not be stored at home. NOTE: This sheet is a summary. It may not cover all possible information. If you have questions about this medicine, talk to your doctor, pharmacist, or health care provider.  2024 Elsevier/Gold Standard (2021-03-28 00:00:00) Rimegepant Disintegrating Tablets What is this medication? RIMEGEPANT (ri ME je pant) prevents and treats migraines. It works by blocking a substance in the body that causes migraines. This medicine may be used for other purposes; ask your health care provider or pharmacist if you have questions. COMMON BRAND NAME(S): NURTEC ODT What should I tell my care team before I take this medication? They need to know if you have any of these conditions: Kidney disease Liver disease An unusual or allergic reaction to rimegepant, other medications, foods, dyes, or preservatives Pregnant or trying to get pregnant Breast-feeding How should I use this medication? Take this medication by mouth. Take it as directed on the prescription label. Leave the tablet in the sealed pack until you are ready to take it. With dry hands, open  the pack and gently remove the tablet. If the tablet breaks or crumbles, throw it away. Use a new tablet. Place the tablet in the mouth and allow it to dissolve. Then, swallow it. Do not cut, crush, or chew this medication. You do not need water to take  this medication. Talk to your care team about the use of this medication in children. Special care may be needed. Overdosage: If you think you have taken too much of this medicine contact a poison control center or emergency room at once. NOTE: This medicine is only for you. Do not share this medicine with others. What if I miss a dose? This does not apply. This medication is not for regular use. What may interact with this medication? Certain medications for fungal infections, such as fluconazole, itraconazole Rifampin This list may not describe all possible interactions. Give your health care provider a list of all the medicines, herbs, non-prescription drugs, or dietary supplements you use. Also tell them if you smoke, drink alcohol, or use illegal drugs. Some items may interact with your medicine. What should I watch for while using this medication? Visit your care team for regular checks on your progress. Tell your care team if your symptoms do not start to get better or if they get worse. What side effects may I notice from receiving this medication? Side effects that you should report to your care team as soon as possible: Allergic reactions--skin rash, itching, hives, swelling of the face, lips, tongue, or throat Side effects that usually do not require medical attention (report to your care team if they continue or are bothersome): Nausea Stomach pain This list may not describe all possible side effects. Call your doctor for medical advice about side effects. You may report side effects to FDA at 1-800-FDA-1088. Where should I keep my medication? Keep out of the reach of children and pets. Store at room temperature between 20 and 25 degrees C (68 and 77 degrees F). Get rid of any unused medication after the expiration date. To get rid of medications that are no longer needed or have expired: Take the medication to a medication take-back program. Check with your pharmacy or law  enforcement to find a location. If you cannot return the medication, check the label or package insert to see if the medication should be thrown out in the garbage or flushed down the toilet. If you are not sure, ask your care team. If it is safe to put it in the trash, take the medication out of the container. Mix the medication with cat litter, dirt, coffee grounds, or other unwanted substance. Seal the mixture in a bag or container. Put it in the trash. NOTE: This sheet is a summary. It may not cover all possible information. If you have questions about this medicine, talk to your doctor, pharmacist, or health care provider.  2024 Elsevier/Gold Standard (2021-03-23 00:00:00)   Non pharmaceutical treatments for migraines  Cefaly    Nerivio     gammaCore SapphireTM is the first and only FDA cleared non-invasive device to treat and prevent multiple types of headache pain via the vagus nerve. It's small, handheld and portable for quick and easy treatments whenever and wherever needed.     E-Neura - [email or web address]     Fascial release tools
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
## 2023-02-15 ENCOUNTER — Telehealth: Payer: Self-pay | Admitting: Anesthesiology

## 2023-02-15 ENCOUNTER — Telehealth: Payer: Self-pay | Admitting: Neurology

## 2023-02-15 DIAGNOSIS — M9902 Segmental and somatic dysfunction of thoracic region: Secondary | ICD-10-CM | POA: Diagnosis not present

## 2023-02-15 DIAGNOSIS — M9903 Segmental and somatic dysfunction of lumbar region: Secondary | ICD-10-CM | POA: Diagnosis not present

## 2023-02-15 DIAGNOSIS — M62830 Muscle spasm of back: Secondary | ICD-10-CM | POA: Diagnosis not present

## 2023-02-15 DIAGNOSIS — M9901 Segmental and somatic dysfunction of cervical region: Secondary | ICD-10-CM | POA: Diagnosis not present

## 2023-02-15 MED ORDER — NURTEC 75 MG PO TBDP: 75.0000 mg | ORAL_TABLET | Freq: Every day | ORAL | Status: DC | PRN

## 2023-02-15 NOTE — Telephone Encounter (Signed)
-----   Message from Anson Fret sent at 02/15/2023  1:27 PM EST ----- Regarding: start vyepti protocol Please start vyepti protocol. G43.711  Selena Barnes, since she is medicare, can we also start botox at the same time?

## 2023-02-15 NOTE — Telephone Encounter (Signed)
 Form for Vyepti was filled out and awaiting Dr Trevor Mace signature.

## 2023-02-15 NOTE — Telephone Encounter (Signed)
 Submitted benefit verification, will update once results are received. [id number]

## 2023-02-20 ENCOUNTER — Telehealth: Payer: Self-pay | Admitting: Neurology

## 2023-02-20 DIAGNOSIS — M9903 Segmental and somatic dysfunction of lumbar region: Secondary | ICD-10-CM | POA: Diagnosis not present

## 2023-02-20 DIAGNOSIS — M9902 Segmental and somatic dysfunction of thoracic region: Secondary | ICD-10-CM | POA: Diagnosis not present

## 2023-02-20 DIAGNOSIS — M62830 Muscle spasm of back: Secondary | ICD-10-CM | POA: Diagnosis not present

## 2023-02-20 DIAGNOSIS — M9901 Segmental and somatic dysfunction of cervical region: Secondary | ICD-10-CM | POA: Diagnosis not present

## 2023-02-20 NOTE — Telephone Encounter (Signed)
 [place], [facility] NPR case [id number] sent to GI [phone]

## 2023-02-21 DIAGNOSIS — M9901 Segmental and somatic dysfunction of cervical region: Secondary | ICD-10-CM | POA: Diagnosis not present

## 2023-02-21 DIAGNOSIS — M9903 Segmental and somatic dysfunction of lumbar region: Secondary | ICD-10-CM | POA: Diagnosis not present

## 2023-02-21 DIAGNOSIS — M9902 Segmental and somatic dysfunction of thoracic region: Secondary | ICD-10-CM | POA: Diagnosis not present

## 2023-02-21 DIAGNOSIS — M62830 Muscle spasm of back: Secondary | ICD-10-CM | POA: Diagnosis not present

## 2023-02-22 ENCOUNTER — Ambulatory Visit: Payer: Self-pay | Admitting: *Deleted

## 2023-02-22 DIAGNOSIS — M62830 Muscle spasm of back: Secondary | ICD-10-CM | POA: Diagnosis not present

## 2023-02-22 DIAGNOSIS — M9903 Segmental and somatic dysfunction of lumbar region: Secondary | ICD-10-CM | POA: Diagnosis not present

## 2023-02-22 DIAGNOSIS — M9902 Segmental and somatic dysfunction of thoracic region: Secondary | ICD-10-CM | POA: Diagnosis not present

## 2023-02-22 DIAGNOSIS — M9901 Segmental and somatic dysfunction of cervical region: Secondary | ICD-10-CM | POA: Diagnosis not present

## 2023-02-22 NOTE — Patient Outreach (Signed)
 Care Coordination   Follow Up Visit Note   02/22/2023  Name: Selena Barnes MRN: [id number] DOB: 15-Jan-1951  Selena Barnes is a 73 y.o. year old female who sees Dettinger, Fonda DELENA, MD for primary care. I spoke with  Selena Barnes by phone today.  What matters to the patients health and wellness today?  EMMI - Reduce & Manage Symptoms of Sadness.    Goals Addressed               This Visit's Progress     EMMI - Reduce & Manage Symptoms of Sadness. 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, Hyperlipidemia, Depression & Insomnia.]  General Interventions   General Interventions Discussed/Reviewed General Interventions Discussed, Labs, Vaccines, Doctor Visits, Communication with, Level of Care, [facility], General Interventions Reviewed, Horticulturist, Commercial (DME), Annual Eye Exam, Annual Foot Exam, Health Screening, Lipid Profile  [Encouraged Routine Engagement with Care Team Members.]  Labs Hgb A1c every 3 months, Kidney Function  [Encouraged Routine Labwork.]  Vaccines COVID-19, Flu, Pneumonia, RSV, Shingles, Tetanus/Pertussis/Diphtheria  [Encouraged Annual Vaccinations.]  Doctor Visits Discussed/Reviewed Doctor Visits Discussed, Specialist, Doctor Visits Reviewed, Annual Wellness Visits, PCP  [Encouraged Routine Engagement with Care Team Members.]  Health Screening Bone Density, Colonoscopy, Mammogram  [Encouraged Annual Health Screenings.]  Durable Medical Equipment (DME) --  [None]  PCP/Specialist Visits Compliance with follow-up visit  [Encouraged Routine Engagement with Care Team Members.]  Communication with PCP/Specialists, RN, Pharmacists, Social Work  [employer] Routine Engagement with Care Team Members.]  Level of Care Adult Daycare,  Air Traffic Controller, Assisted Living, Skilled Pharmacologist, Personal Care Services  [Confirmed Disinterest in Applying for Medicaid & Personal Care Services.]  Exercise Interventions   Exercise Discussed/Reviewed Exercise Discussed, Assistive device use and maintanence, Exercise Reviewed, Physical Activity, Weight Managment  [Encouraged Daily Exercise Regimen, as Tolerated.]  Physical Activity Discussed/Reviewed Physical Activity Discussed, Home Exercise Program (HEP), PREP, Physical Activity Reviewed, Gym, Types of exercise  [Encouraged Increased Level of Activity & Exercise. Encouraged Increased Engagement in Activities of Interest, Inside & Outside the Home.]  Weight Management Weight loss  [Encouraged Healthy Weight Loss Program.]  Education Interventions   Education Provided Provided Therapist, Sports, Provided Web-based Education, Provided Education  [employer] Reviewed Educational Material & Entertained Questions to Ensure Understanding.]  Provided Verbal Education On Nutrition, Mental Health/Coping with Illness, When to see the doctor, Foot Care, Eye Care, Labs, Applications, Exercise, Medication, Development Worker, Community, [employer]  [employer] Consideration of Educational Material Reviewed & Encouraged Implementation.]  Labs Reviewed Hgb A1c  [Reviewed.]  Applications Medicaid, Personal Care Services  [Confirmed Disinterest in Applying for Medicaid & Personal Care Services.]  Mental Health Interventions   Mental Health Discussed/Reviewed Mental Health Discussed, Anxiety, Depression, Mental Health Reviewed, Grief and Loss, Substance Abuse, Coping Strategies, Suicide, Crisis, Other  [Assessed Mental Health & Cognitive Status.]  Nutrition Interventions   Nutrition Discussed/Reviewed Nutrition Discussed, Adding fruits and vegetables, Nutrition Reviewed, Fluid intake, Carbohydrate meal planning, Portion sizes, Decreasing sugar intake, Increasing proteins, Decreasing fats,  Decreasing salt  [Encouraged Heart-Healthy, Low Sodium, Reduced Fat, Reduced Sugar Diet.]  Pharmacy Interventions   Pharmacy Dicussed/Reviewed Pharmacy Topics Discussed, Medications and their functions, Medication Adherence, Pharmacy Topics Reviewed, Affording Medications  [Confirmed Ability to Afford Prescription Medications.]  Medication Adherence --  [  Confirmed Prescription Medication Compliance.]  Safety Interventions   Safety Discussed/Reviewed Safety Discussed, Safety Reviewed  [Encouraged Consideration of Home Safety Evaluation.]  Advanced Directive Interventions   Advanced Directives Discussed/Reviewed Advanced Directives Discussed, Advanced Directives Reviewed, Advanced Care Planning  [Encouraged Initiation of Advanced Directives (Living Will & Healthcare Power of Attorney Documents), Agreeing to [employer], Assist with Completion, Make Copies & Scan into Electronic Medical Record in Epic.]         Active Listening & Reflection Utilized.  Verbalization of Feelings Encouraged.  Emotional Support Provided. Problem Solving Interventions Activated. Task-Centered Solutions Employed.   Solution-Focused Strategies Implemented. Acceptance & Commitment Therapy Initiated. Brief Cognitive Behavioral Therapy Performed. Client-Centered Therapy Indicated. Encouraged Self-Enrollment with Psychiatrist of Interest in [facility], to Receive Psychotropic Medication Administration & Management, in An Effort to Reduce & Manage Symptoms of Sadness, from List Provided. Encouraged Self-Enrollment with Therapist of Interest in [facility], to Receive Psychotropic Medication Administration & Management, in An Effort to Reduce & Manage Symptoms of Sadness, from List Provided. Encouraged Routine Engagement with Kamyra Schroeck, Licensed Clinical Social Worker with [facility] [phone]), if You Have Questions, Need Assistance, or If Additional Social Work Needs Are Identified  Between Now & Our Next Follow-Up Outreach Call, Scheduled on 03/22/2023 at 1:30 PM.       SDOH assessments and interventions completed:  Yes.  Care Coordination Interventions:  Yes, provided.   Follow up plan: Follow up call scheduled for 03/22/2023 at 1:30 pm.  Encounter Outcome:  Patient Visit Completed.   Philippe Desanctis, BSW, MSW, Printmaker Social Work Case Set Designer Health  [facility], Population Health Direct Dial: [phone]  Fax: [phone] Email: [email or web address] Website: [email or web address]

## 2023-02-22 NOTE — Patient Instructions (Signed)
 Visit Information  Thank you for taking time to visit with me today. Please don't hesitate to contact me if I can be of assistance to you.   Following are the goals we discussed today:   Goals Addressed               This Visit's Progress     EMMI - Reduce & Manage Symptoms of Sadness. 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, Hyperlipidemia, Depression & Insomnia.]  General Interventions   General Interventions Discussed/Reviewed General Interventions Discussed, Labs, Vaccines, Doctor Visits, Communication with, Level of Care, [facility], General Interventions Reviewed, Horticulturist, Commercial (DME), Annual Eye Exam, Annual Foot Exam, Health Screening, Lipid Profile  [Encouraged Routine Engagement with Care Team Members.]  Labs Hgb A1c every 3 months, Kidney Function  [Encouraged Routine Labwork.]  Vaccines COVID-19, Flu, Pneumonia, RSV, Shingles, Tetanus/Pertussis/Diphtheria  [Encouraged Annual Vaccinations.]  Doctor Visits Discussed/Reviewed Doctor Visits Discussed, Specialist, Doctor Visits Reviewed, Annual Wellness Visits, PCP  [Encouraged Routine Engagement with Care Team Members.]  Health Screening Bone Density, Colonoscopy, Mammogram  [Encouraged Annual Health Screenings.]  Durable Medical Equipment (DME) --  [None]  PCP/Specialist Visits Compliance with follow-up visit  [Encouraged Routine Engagement with Care Team Members.]  Communication with PCP/Specialists, RN, Pharmacists, Social Work  [employer] Routine Engagement with Care Team Members.]  Level of Care Adult Daycare, Air Traffic Controller, Assisted Living, Skilled Pharmacologist, Personal Care Services  [Confirmed Disinterest in Applying for Medicaid & Personal Care  Services.]  Exercise Interventions   Exercise Discussed/Reviewed Exercise Discussed, Assistive device use and maintanence, Exercise Reviewed, Physical Activity, Weight Managment  [Encouraged Daily Exercise Regimen, as Tolerated.]  Physical Activity Discussed/Reviewed Physical Activity Discussed, Home Exercise Program (HEP), PREP, Physical Activity Reviewed, Gym, Types of exercise  [Encouraged Increased Level of Activity & Exercise. Encouraged Increased Engagement in Activities of Interest, Inside & Outside the Home.]  Weight Management Weight loss  [Encouraged Healthy Weight Loss Program.]  Education Interventions   Education Provided Provided Therapist, Sports, Provided Web-based Education, Provided Education  [employer] Reviewed Educational Material & Entertained Questions to Ensure Understanding.]  Provided Verbal Education On Nutrition, Mental Health/Coping with Illness, When to see the doctor, Foot Care, Eye Care, Labs, Applications, Exercise, Medication, Development Worker, Community, [employer]  [employer] Consideration of Educational Material Reviewed & Encouraged Implementation.]  Labs Reviewed Hgb A1c  [Reviewed.]  Applications Medicaid, Personal Care Services  [Confirmed Disinterest in Applying for Medicaid & Personal Care Services.]  Mental Health Interventions   Mental Health Discussed/Reviewed Mental Health Discussed, Anxiety, Depression, Mental Health Reviewed, Grief and Loss, Substance Abuse, Coping Strategies, Suicide, Crisis, Other  [Assessed Mental Health & Cognitive Status.]  Nutrition Interventions   Nutrition Discussed/Reviewed Nutrition Discussed, Adding fruits and vegetables, Nutrition Reviewed, Fluid intake, Carbohydrate meal planning, Portion sizes, Decreasing sugar intake, Increasing proteins, Decreasing fats, Decreasing salt  [Encouraged Heart-Healthy, Low Sodium, Reduced Fat, Reduced Sugar Diet.]  Pharmacy Interventions   Pharmacy Dicussed/Reviewed Pharmacy Topics Discussed,  Medications and their functions, Medication Adherence, Pharmacy Topics Reviewed, Affording Medications  [Confirmed Ability to Afford Prescription Medications.]  Medication Adherence --  [Confirmed Prescription Medication Compliance.]  Safety Interventions   Safety Discussed/Reviewed Safety Discussed, Safety Reviewed  [Encouraged Consideration of Home Safety Evaluation.]  Advanced Directive Interventions   Advanced Directives Discussed/Reviewed Advanced Directives Discussed,  Advanced Directives Reviewed, Advanced Care Planning  [Encouraged Initiation of Advanced Directives (Living Will & Healthcare Power of Attorney Documents), Agreeing to [employer], Assist with Completion, Make Copies & Scan into Electronic Medical Record in Epic.]         Active Listening & Reflection Utilized.  Verbalization of Feelings Encouraged.  Emotional Support Provided. Problem Solving Interventions Activated. Task-Centered Solutions Employed.   Solution-Focused Strategies Implemented. Acceptance & Commitment Therapy Initiated. Brief Cognitive Behavioral Therapy Performed. Client-Centered Therapy Indicated. Encouraged Self-Enrollment with Psychiatrist of Interest in [facility], to Receive Psychotropic Medication Administration & Management, in An Effort to Reduce & Manage Symptoms of Sadness, from List Provided. Encouraged Self-Enrollment with Therapist of Interest in [facility], to Receive Psychotropic Medication Administration & Management, in An Effort to Reduce & Manage Symptoms of Sadness, from List Provided. Encouraged Routine Engagement with Dhiya Smits, Licensed Clinical Social Worker with [facility] [phone]), if You Have Questions, Need Assistance, or If Additional Social Work Needs Are Identified Between Now & Our Next Follow-Up Outreach Call, Scheduled on 03/22/2023 at 1:30 PM.       Our next appointment is by telephone on 03/22/2023 at 1:30 pm.  Please call  the care guide team at [phone] if you need to cancel or reschedule your appointment.   If you are experiencing a Mental Health or Behavioral Health Crisis or need someone to talk to, please call the Suicide and Crisis Lifeline: 988 call the [place]  National Suicide Prevention Lifeline: 6177486718 or TTY: 623-866-8642 TTY (502)274-0516) to talk to a trained counselor call 1-800-273-TALK (toll free, 24 hour hotline) go to [facility] [address] [place] [phone]) call the [facility] Crisis Line: (671) [phone] call 911  Patient verbalizes understanding of instructions and care plan provided today and agrees to view in MyChart. Active MyChart status and patient understanding of how to access instructions and care plan via MyChart confirmed with patient.     Telephone follow up appointment with care management team member scheduled for:  03/22/2023 at 1:30 pm.  Philippe Desanctis, BSW, MSW, LCSW  Embedded Practice Social Work Case Manager  [facility], Population Health Direct Dial: [phone]  Fax: [phone] Email: [email or web address] Website: [email or web address]

## 2023-02-26 DIAGNOSIS — M9901 Segmental and somatic dysfunction of cervical region: Secondary | ICD-10-CM | POA: Diagnosis not present

## 2023-02-26 DIAGNOSIS — M62830 Muscle spasm of back: Secondary | ICD-10-CM | POA: Diagnosis not present

## 2023-02-26 DIAGNOSIS — M9903 Segmental and somatic dysfunction of lumbar region: Secondary | ICD-10-CM | POA: Diagnosis not present

## 2023-02-26 DIAGNOSIS — M9902 Segmental and somatic dysfunction of thoracic region: Secondary | ICD-10-CM | POA: Diagnosis not present

## 2023-02-26 NOTE — Telephone Encounter (Signed)
 Received vyepti form from [facility] needing VA approval letter from [place]. I attempted to call on Thursday long wait time.  I called today LMVM for them to return call when my # comes up on the que.

## 2023-02-26 NOTE — Telephone Encounter (Signed)
 Received approval from [facility], pt will be buy/bill because Medicare is primary. Awaiting benefit verification to see if Selena Barnes is required through [place].  [facility] auth#: [id number] (02/26/23-02/26/24)

## 2023-02-26 NOTE — Telephone Encounter (Signed)
 Called again and left my 3 in que to be called back.  I am now calling to see if I can reach someone by holding.  (Over 40 mins).

## 2023-02-27 NOTE — Telephone Encounter (Signed)
 I called [facility] this am 0758 not open.  Called [phone] and one hour wait.  I left message for them to return call me back.

## 2023-02-27 NOTE — Telephone Encounter (Signed)
Pt returned phone call would like a call back. 

## 2023-02-27 NOTE — Telephone Encounter (Signed)
 I was able to connect with [facility].  Spoke to [employer].  Ref# [id number]. No preauthorization required for infusions.

## 2023-02-27 NOTE — Telephone Encounter (Signed)
 Called [facility] again. Awaiting return call.

## 2023-02-27 NOTE — Telephone Encounter (Signed)
 Have not heard back from [facility].  I called and LMVM for pt to return call.

## 2023-02-28 NOTE — Telephone Encounter (Signed)
 Orders given back to [facility] this am, with information below re: no auth required.     I called and LMVM for pt on mobile, that I spoke to [facility], no auth required for infusions. I relayed back to [facility] to proceed.  No need to call back unless questions.

## 2023-02-28 NOTE — Telephone Encounter (Signed)
 Pt returning call. Advised of previous note. Pt understood and didn't have any questions at this time

## 2023-03-01 DIAGNOSIS — M9901 Segmental and somatic dysfunction of cervical region: Secondary | ICD-10-CM | POA: Diagnosis not present

## 2023-03-01 DIAGNOSIS — M62830 Muscle spasm of back: Secondary | ICD-10-CM | POA: Diagnosis not present

## 2023-03-01 DIAGNOSIS — M9902 Segmental and somatic dysfunction of thoracic region: Secondary | ICD-10-CM | POA: Diagnosis not present

## 2023-03-01 DIAGNOSIS — M9903 Segmental and somatic dysfunction of lumbar region: Secondary | ICD-10-CM | POA: Diagnosis not present

## 2023-03-05 DIAGNOSIS — M9901 Segmental and somatic dysfunction of cervical region: Secondary | ICD-10-CM | POA: Diagnosis not present

## 2023-03-05 DIAGNOSIS — M9902 Segmental and somatic dysfunction of thoracic region: Secondary | ICD-10-CM | POA: Diagnosis not present

## 2023-03-05 DIAGNOSIS — M9903 Segmental and somatic dysfunction of lumbar region: Secondary | ICD-10-CM | POA: Diagnosis not present

## 2023-03-05 DIAGNOSIS — M62830 Muscle spasm of back: Secondary | ICD-10-CM | POA: Diagnosis not present

## 2023-03-06 NOTE — Telephone Encounter (Signed)
Called [place] to check auth status, spoke with [place]. She states Berkley Harvey is not required unless the service is for dental, transplant, or mental health services.

## 2023-03-07 DIAGNOSIS — M9901 Segmental and somatic dysfunction of cervical region: Secondary | ICD-10-CM | POA: Diagnosis not present

## 2023-03-07 DIAGNOSIS — M9903 Segmental and somatic dysfunction of lumbar region: Secondary | ICD-10-CM | POA: Diagnosis not present

## 2023-03-07 DIAGNOSIS — M62830 Muscle spasm of back: Secondary | ICD-10-CM | POA: Diagnosis not present

## 2023-03-07 DIAGNOSIS — M9902 Segmental and somatic dysfunction of thoracic region: Secondary | ICD-10-CM | POA: Diagnosis not present

## 2023-03-08 DIAGNOSIS — M62830 Muscle spasm of back: Secondary | ICD-10-CM | POA: Diagnosis not present

## 2023-03-08 DIAGNOSIS — M9901 Segmental and somatic dysfunction of cervical region: Secondary | ICD-10-CM | POA: Diagnosis not present

## 2023-03-08 DIAGNOSIS — M9903 Segmental and somatic dysfunction of lumbar region: Secondary | ICD-10-CM | POA: Diagnosis not present

## 2023-03-08 DIAGNOSIS — M9902 Segmental and somatic dysfunction of thoracic region: Secondary | ICD-10-CM | POA: Diagnosis not present

## 2023-03-08 NOTE — Telephone Encounter (Signed)
Let her know unfortunately I am booked out and she will have to wait for an NP for her first botox, wish we could get it to her sooner thanks

## 2023-03-12 DIAGNOSIS — M9902 Segmental and somatic dysfunction of thoracic region: Secondary | ICD-10-CM | POA: Diagnosis not present

## 2023-03-12 DIAGNOSIS — M9903 Segmental and somatic dysfunction of lumbar region: Secondary | ICD-10-CM | POA: Diagnosis not present

## 2023-03-12 DIAGNOSIS — M62830 Muscle spasm of back: Secondary | ICD-10-CM | POA: Diagnosis not present

## 2023-03-12 DIAGNOSIS — M9901 Segmental and somatic dysfunction of cervical region: Secondary | ICD-10-CM | POA: Diagnosis not present

## 2023-03-15 DIAGNOSIS — M9903 Segmental and somatic dysfunction of lumbar region: Secondary | ICD-10-CM | POA: Diagnosis not present

## 2023-03-15 DIAGNOSIS — M9902 Segmental and somatic dysfunction of thoracic region: Secondary | ICD-10-CM | POA: Diagnosis not present

## 2023-03-15 DIAGNOSIS — M62830 Muscle spasm of back: Secondary | ICD-10-CM | POA: Diagnosis not present

## 2023-03-15 DIAGNOSIS — M9901 Segmental and somatic dysfunction of cervical region: Secondary | ICD-10-CM | POA: Diagnosis not present

## 2023-03-19 DIAGNOSIS — M62830 Muscle spasm of back: Secondary | ICD-10-CM | POA: Diagnosis not present

## 2023-03-19 DIAGNOSIS — M9903 Segmental and somatic dysfunction of lumbar region: Secondary | ICD-10-CM | POA: Diagnosis not present

## 2023-03-19 DIAGNOSIS — M9902 Segmental and somatic dysfunction of thoracic region: Secondary | ICD-10-CM | POA: Diagnosis not present

## 2023-03-19 DIAGNOSIS — M9901 Segmental and somatic dysfunction of cervical region: Secondary | ICD-10-CM | POA: Diagnosis not present

## 2023-03-20 DIAGNOSIS — M9902 Segmental and somatic dysfunction of thoracic region: Secondary | ICD-10-CM | POA: Diagnosis not present

## 2023-03-20 DIAGNOSIS — M62830 Muscle spasm of back: Secondary | ICD-10-CM | POA: Diagnosis not present

## 2023-03-20 DIAGNOSIS — M9901 Segmental and somatic dysfunction of cervical region: Secondary | ICD-10-CM | POA: Diagnosis not present

## 2023-03-20 DIAGNOSIS — M9903 Segmental and somatic dysfunction of lumbar region: Secondary | ICD-10-CM | POA: Diagnosis not present

## 2023-03-22 ENCOUNTER — Ambulatory Visit: Payer: Medicare Other | Admitting: *Deleted

## 2023-03-22 NOTE — Patient Instructions (Signed)
 Visit Information  Thank you for taking time to visit with me today. Please don't hesitate to contact me if I can be of assistance to you.   Following are the goals we discussed today:   Goals Addressed               This Visit's Progress     COMPLETED: EMMI - Reduce & Manage Symptoms of Sadness. 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, Hyperlipidemia, Depression & Insomnia.]  General Interventions   General Interventions Discussed/Reviewed General Interventions Discussed, Labs, Vaccines, Doctor Visits, Communication with, Level of Care, [facility], General Interventions Reviewed, Horticulturist, Commercial (DME), Annual Eye Exam, Annual Foot Exam, Health Screening, Lipid Profile  [Encouraged Routine Engagement with Care Team Members.]  Labs Hgb A1c every 3 months, Kidney Function  [Encouraged Routine Labwork.]  Vaccines COVID-19, Flu, Pneumonia, RSV, Shingles, Tetanus/Pertussis/Diphtheria  [Encouraged Annual Vaccinations.]  Doctor Visits Discussed/Reviewed Doctor Visits Discussed, Specialist, Doctor Visits Reviewed, Annual Wellness Visits, PCP  [Encouraged Routine Engagement with Care Team Members.]  Health Screening Bone Density, Colonoscopy, Mammogram  [Encouraged Annual Health Screenings.]  Durable Medical Equipment (DME) --  [None]  PCP/Specialist Visits Compliance with follow-up visit  [Encouraged Routine Engagement with Care Team Members.]  Communication with PCP/Specialists, RN, Pharmacists, Social Work  [employer] Routine Engagement with Care Team Members.]  Level of Care Adult Daycare, Air Traffic Controller, Assisted Living, Skilled Pharmacologist, Personal Care Services  [Confirmed Disinterest in Applying for Medicaid & Personal  Care Services.]  Exercise Interventions   Exercise Discussed/Reviewed Exercise Discussed, Assistive device use and maintanence, Exercise Reviewed, Physical Activity, Weight Managment  [Encouraged Daily Exercise Regimen, as Tolerated.]  Physical Activity Discussed/Reviewed Physical Activity Discussed, Home Exercise Program (HEP), PREP, Physical Activity Reviewed, Gym, Types of exercise  [Encouraged Increased Level of Activity & Exercise. Encouraged Increased Engagement in Activities of Interest, Inside & Outside the Home.]  Weight Management Weight loss  [Encouraged Healthy Weight Loss Program.]  Education Interventions   Education Provided Provided Therapist, Sports, Provided Web-based Education, Provided Education  [employer] Reviewed Educational Material & Entertained Questions to Ensure Understanding.]  Provided Verbal Education On Nutrition, Mental Health/Coping with Illness, When to see the doctor, Foot Care, Eye Care, Labs, Applications, Exercise, Medication, Development Worker, Community, [employer]  [employer] Consideration of Educational Material Reviewed & Encouraged Implementation.]  Labs Reviewed Hgb A1c  [Reviewed.]  Applications Medicaid, Personal Care Services  [Confirmed Disinterest in Applying for Medicaid & Personal Care Services.]  Mental Health Interventions   Mental Health Discussed/Reviewed Mental Health Discussed, Anxiety, Depression, Mental Health Reviewed, Grief and Loss, Substance Abuse, Coping Strategies, Suicide, Crisis, Other  [Assessed Mental Health & Cognitive Status.]  Nutrition Interventions   Nutrition Discussed/Reviewed Nutrition Discussed, Adding fruits and vegetables, Nutrition Reviewed, Fluid intake, Carbohydrate meal planning, Portion sizes, Decreasing sugar intake, Increasing proteins, Decreasing fats, Decreasing salt  [Encouraged Heart-Healthy, Low Sodium, Reduced Fat, Reduced Sugar Diet.]  Pharmacy Interventions   Pharmacy Dicussed/Reviewed Pharmacy Topics  Discussed, Medications and their functions, Medication Adherence, Pharmacy Topics Reviewed, Affording Medications  [Confirmed Ability to Afford Prescription Medications.]  Medication Adherence --  [Confirmed Prescription Medication Compliance.]  Safety Interventions   Safety Discussed/Reviewed Safety Discussed, Safety Reviewed  [Encouraged Consideration of Home Safety Evaluation.]  Advanced Directive Interventions   Advanced Directives Discussed/Reviewed Advanced Directives  Discussed, Advanced Directives Reviewed, Advanced Care Planning  [Encouraged Initiation of Advanced Directives (Living Will & Healthcare Power of Attorney Documents), Agreeing to [employer], Assist with Completion, Make Copies & Scan into Electronic Medical Record in Epic.]         Active Listening & Reflection Utilized.  Verbalization of Feelings Encouraged.  Emotional Support Provided. Acceptance & Commitment Therapy Indicated. Brief Cognitive Behavioral Therapy Initiated. Encouraged Engagement with Philippe Desanctis, Licensed Clinical Social Worker with [facility], [facility] [phone]), if You Have Questions, Need Assistance, Additional Social Work Needs Are Identified, or If You Change Your Mind About Wanting to Receive Social Work Services.      Please call the care guide team at [phone] if you need to cancel or reschedule your appointment.   If you are experiencing a Mental Health or Behavioral Health Crisis or need someone to talk to, please call the Suicide and Crisis Lifeline: 988 call the [place]  National Suicide Prevention Lifeline: 623-040-5311 or TTY: 6121089739 TTY (586)661-9663) to talk to a trained counselor call 1-800-273-TALK (toll free, 24 hour hotline) go to [facility] [address], [place] [phone]) call the [facility] Crisis Line: 3470364071 call 911  Patient verbalizes understanding of  instructions and care plan provided today and agrees to view in MyChart. Active MyChart status and patient understanding of how to access instructions and care plan via MyChart confirmed with patient.     No further follow up required.   Philippe Desanctis, BSW, MSW, LCSW [facility], [facility] Clinical Social Worker II Direct Dial: [phone]  Fax: [phone] Website: [email or web address]

## 2023-03-22 NOTE — Patient Outreach (Signed)
 Care Coordination   Follow Up Visit Note   03/22/2023  Name: Selena Barnes MRN: [id number] DOB: Feb 10, 1951  Selena Barnes is a 73 y.o. year old female who sees Dettinger, Fonda DELENA, MD for primary care. I spoke with Selena Barnes by phone today.  What matters to the patients health and wellness today?  EMMI - Reduce & Manage Symptoms of Sadness.    Goals Addressed               This Visit's Progress     COMPLETED: EMMI - Reduce & Manage Symptoms of Sadness. 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, Hyperlipidemia, Depression & Insomnia.]  General Interventions   General Interventions Discussed/Reviewed General Interventions Discussed, Labs, Vaccines, Doctor Visits, Communication with, Level of Care, [facility], General Interventions Reviewed, Horticulturist, Commercial (DME), Annual Eye Exam, Annual Foot Exam, Health Screening, Lipid Profile  [Encouraged Routine Engagement with Care Team Members.]  Labs Hgb A1c every 3 months, Kidney Function  [Encouraged Routine Labwork.]  Vaccines COVID-19, Flu, Pneumonia, RSV, Shingles, Tetanus/Pertussis/Diphtheria  [Encouraged Annual Vaccinations.]  Doctor Visits Discussed/Reviewed Doctor Visits Discussed, Specialist, Doctor Visits Reviewed, Annual Wellness Visits, PCP  [Encouraged Routine Engagement with Care Team Members.]  Health Screening Bone Density, Colonoscopy, Mammogram  [Encouraged Annual Health Screenings.]  Durable Medical Equipment (DME) --  [None]  PCP/Specialist Visits Compliance with follow-up visit  [Encouraged Routine Engagement with Care Team Members.]  Communication with PCP/Specialists, RN, Pharmacists, Social Work  [employer] Routine Engagement with Care Team Members.]  Level of Care Adult  Daycare, Air Traffic Controller, Assisted Living, Skilled Pharmacologist, Personal Care Services  [Confirmed Disinterest in Applying for Medicaid & Personal Care Services.]  Exercise Interventions   Exercise Discussed/Reviewed Exercise Discussed, Assistive device use and maintanence, Exercise Reviewed, Physical Activity, Weight Managment  [Encouraged Daily Exercise Regimen, as Tolerated.]  Physical Activity Discussed/Reviewed Physical Activity Discussed, Home Exercise Program (HEP), PREP, Physical Activity Reviewed, Gym, Types of exercise  [Encouraged Increased Level of Activity & Exercise. Encouraged Increased Engagement in Activities of Interest, Inside & Outside the Home.]  Weight Management Weight loss  [Encouraged Healthy Weight Loss Program.]  Education Interventions   Education Provided Provided Therapist, Sports, Provided Web-based Education, Provided Education  [employer] Reviewed Educational Material & Entertained Questions to Ensure Understanding.]  Provided Verbal Education On Nutrition, Mental Health/Coping with Illness, When to see the doctor, Foot Care, Eye Care, Labs, Applications, Exercise, Medication, Development Worker, Community, [employer]  [employer] Consideration of Educational Material Reviewed & Encouraged Implementation.]  Labs Reviewed Hgb A1c  [Reviewed.]  Applications Medicaid, Personal Care Services  [Confirmed Disinterest in Applying for Medicaid & Personal Care Services.]  Mental Health Interventions   Mental Health Discussed/Reviewed Mental Health Discussed, Anxiety, Depression, Mental Health Reviewed, Grief and Loss, Substance Abuse, Coping Strategies, Suicide, Crisis, Other  [Assessed Mental Health & Cognitive Status.]  Nutrition Interventions   Nutrition Discussed/Reviewed Nutrition Discussed, Adding fruits and vegetables, Nutrition Reviewed, Fluid intake, Carbohydrate meal planning, Portion sizes, Decreasing sugar intake, Increasing proteins, Decreasing  fats, Decreasing salt  [Encouraged Heart-Healthy, Low Sodium, Reduced Fat, Reduced Sugar Diet.]  Pharmacy Interventions   Pharmacy Dicussed/Reviewed Pharmacy Topics Discussed, Medications and their functions, Medication Adherence, Pharmacy Topics Reviewed, Affording Medications  [Confirmed Ability to Afford Prescription Medications.]  Medication Adherence --  [  Confirmed Prescription Medication Compliance.]  Safety Interventions   Safety Discussed/Reviewed Safety Discussed, Safety Reviewed  [Encouraged Consideration of Home Safety Evaluation.]  Advanced Directive Interventions   Advanced Directives Discussed/Reviewed Advanced Directives Discussed, Advanced Directives Reviewed, Advanced Care Planning  [Encouraged Initiation of Advanced Directives (Living Will & Healthcare Power of Attorney Documents), Agreeing to [employer], Assist with Completion, Make Copies & Scan into Electronic Medical Record in Epic.]         Active Listening & Reflection Utilized.  Verbalization of Feelings Encouraged.  Emotional Support Provided. Acceptance & Commitment Therapy Indicated. Brief Cognitive Behavioral Therapy Initiated. Encouraged Engagement with Philippe Desanctis, Licensed Clinical Social Worker with [facility], [facility] [phone]), if You Have Questions, Need Assistance, Additional Social Work Needs Are Identified, or If You Change Your Mind About Wanting to Receive Social Work Services.      SDOH assessments and interventions completed:  Yes.  Care Coordination Interventions:  Yes, provided.   Follow up plan: No further intervention required.   Encounter Outcome:  Patient Visit Completed.    Philippe Desanctis, BSW, MSW, LCSW [facility], [facility] Clinical Social Worker II Direct Dial: [phone]  Fax: [phone] Website: [email or web address]

## 2023-03-27 DIAGNOSIS — G43711 Chronic migraine without aura, intractable, with status migrainosus: Secondary | ICD-10-CM | POA: Diagnosis not present

## 2023-03-28 DIAGNOSIS — M9901 Segmental and somatic dysfunction of cervical region: Secondary | ICD-10-CM | POA: Diagnosis not present

## 2023-03-28 DIAGNOSIS — M9903 Segmental and somatic dysfunction of lumbar region: Secondary | ICD-10-CM | POA: Diagnosis not present

## 2023-03-28 DIAGNOSIS — M62830 Muscle spasm of back: Secondary | ICD-10-CM | POA: Diagnosis not present

## 2023-03-28 DIAGNOSIS — M9902 Segmental and somatic dysfunction of thoracic region: Secondary | ICD-10-CM | POA: Diagnosis not present

## 2023-03-29 ENCOUNTER — Ambulatory Visit
Admission: RE | Admit: 2023-03-29 | Discharge: 2023-03-29 | Disposition: A | Discharge status: Home / Self Care | Payer: Medicare Other | Source: Ambulatory Visit | Attending: Neurology | Admitting: Neurology

## 2023-03-29 DIAGNOSIS — G8929 Other chronic pain: Secondary | ICD-10-CM

## 2023-03-29 DIAGNOSIS — R51 Headache with orthostatic component, not elsewhere classified: Secondary | ICD-10-CM

## 2023-03-29 DIAGNOSIS — R519 Headache, unspecified: Secondary | ICD-10-CM | POA: Diagnosis not present

## 2023-03-29 DIAGNOSIS — H539 Unspecified visual disturbance: Secondary | ICD-10-CM

## 2023-03-29 DIAGNOSIS — G4484 Primary exertional headache: Secondary | ICD-10-CM | POA: Diagnosis not present

## 2023-03-29 MED ORDER — GADOPICLENOL 0.5 MMOL/ML IV SOLN
7.0000 mL | Freq: Once | INTRAVENOUS | Status: AC | PRN
  Administered 2023-03-29: 7 mL via INTRAVENOUS

## 2023-04-04 DIAGNOSIS — M9903 Segmental and somatic dysfunction of lumbar region: Secondary | ICD-10-CM | POA: Diagnosis not present

## 2023-04-04 DIAGNOSIS — M9901 Segmental and somatic dysfunction of cervical region: Secondary | ICD-10-CM | POA: Diagnosis not present

## 2023-04-04 DIAGNOSIS — M62830 Muscle spasm of back: Secondary | ICD-10-CM | POA: Diagnosis not present

## 2023-04-04 DIAGNOSIS — M9902 Segmental and somatic dysfunction of thoracic region: Secondary | ICD-10-CM | POA: Diagnosis not present

## 2023-04-05 ENCOUNTER — Encounter: Payer: Self-pay | Admitting: Neurology

## 2023-04-05 ENCOUNTER — Telehealth: Payer: Self-pay | Admitting: Neurology

## 2023-04-05 NOTE — Telephone Encounter (Signed)
 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 I sent her a Clinical cytogeneticist message. Can you schedule her for a follow up with me maybe at the end of march of begiining of April if I have anything? thanks

## 2023-04-05 NOTE — Telephone Encounter (Signed)
 Spoke with patient's husband, he stated that patient was unavailable at the moment but will relay message to have her call us back to schedule follow up with Dr. Lucia Gaskins

## 2023-04-18 DIAGNOSIS — M9901 Segmental and somatic dysfunction of cervical region: Secondary | ICD-10-CM | POA: Diagnosis not present

## 2023-04-18 DIAGNOSIS — M62830 Muscle spasm of back: Secondary | ICD-10-CM | POA: Diagnosis not present

## 2023-04-18 DIAGNOSIS — M9903 Segmental and somatic dysfunction of lumbar region: Secondary | ICD-10-CM | POA: Diagnosis not present

## 2023-04-18 DIAGNOSIS — M9902 Segmental and somatic dysfunction of thoracic region: Secondary | ICD-10-CM | POA: Diagnosis not present

## 2023-04-23 ENCOUNTER — Ambulatory Visit (INDEPENDENT_AMBULATORY_CARE_PROVIDER_SITE_OTHER): Payer: Medicare Other | Admitting: Adult Health

## 2023-04-23 DIAGNOSIS — G43711 Chronic migraine without aura, intractable, with status migrainosus: Secondary | ICD-10-CM | POA: Diagnosis not present

## 2023-04-23 DIAGNOSIS — G4484 Primary exertional headache: Secondary | ICD-10-CM

## 2023-04-23 DIAGNOSIS — H539 Unspecified visual disturbance: Secondary | ICD-10-CM

## 2023-04-23 DIAGNOSIS — R519 Headache, unspecified: Secondary | ICD-10-CM

## 2023-04-23 DIAGNOSIS — R51 Headache with orthostatic component, not elsewhere classified: Secondary | ICD-10-CM

## 2023-04-23 DIAGNOSIS — G43919 Migraine, unspecified, intractable, without status migrainosus: Secondary | ICD-10-CM

## 2023-04-23 MED ORDER — ONABOTULINUMTOXINA 200 UNITS IJ SOLR
155.0000 [IU] | Freq: Once | INTRAMUSCULAR | Status: AC
  Administered 2023-04-23: 155 [IU] via INTRAMUSCULAR

## 2023-04-23 NOTE — Progress Notes (Signed)
 Botox- 200 units x 1 vial Lot: [id number] Expiration: 06/12/2025 NDC: 5284-1324-40  Bacteriostatic 0.9% Sodium Chloride- * mL  Lot: [id number] Expiration: 05/19/23 NDC: 3664-4034-74  Dx: Q59.563  B/B Witnessed by Lennie Muckle, RN

## 2023-04-23 NOTE — Progress Notes (Signed)
 Update 04/23/2023 JM: Patient is being seen for initial Botox injection.  She did receive initial Vyepti infusion last month, has noticed some improvement of headaches, currently about 8 to 15/month, previously 25 to 30/month.  Use of Nurtec with benefit.  She tolerated procedure well today.  Return in 3 months for repeat injection.       Consent Form Botulism Toxin Injection For Chronic Migraine    Reviewed orally with patient, additionally signature is on file:  Botulism toxin has been approved by the Federal drug administration for treatment of chronic migraine. Botulism toxin does not cure chronic migraine and it may not be effective in some patients.  The administration of botulism toxin is accomplished by injecting a small amount of toxin into the muscles of the neck and head. Dosage must be titrated for each individual. Any benefits resulting from botulism toxin tend to wear off after 3 months with a repeat injection required if benefit is to be maintained. Injections are usually done every 3-4 months with maximum effect peak achieved by about 2 or 3 weeks. Botulism toxin is expensive and you should be sure of what costs you will incur resulting from the injection.  The side effects of botulism toxin use for chronic migraine may include:   -Transient, and usually mild, facial weakness with facial injections  -Transient, and usually mild, head or neck weakness with head/neck injections  -Reduction or loss of forehead facial animation due to forehead muscle weakness  -Eyelid drooping  -Dry eye  -Pain at the site of injection or bruising at the site of injection  -Double vision  -Potential unknown long term risks   Contraindications: You should not have Botox if you are pregnant, nursing, allergic to albumin, have an infection, skin condition, or muscle weakness at the site of the injection, or have myasthenia gravis, Lambert-Eaton syndrome, or ALS.  It is also possible  that as with any injection, there may be an allergic reaction or no effect from the medication. Reduced effectiveness after repeated injections is sometimes seen and rarely infection at the injection site may occur. All care will be taken to prevent these side effects. If therapy is given over a long time, atrophy and wasting in the muscle injected may occur. Occasionally the patient's become refractory to treatment because they develop antibodies to the toxin. In this event, therapy needs to be modified.  I have read the above information and consent to the administration of botulism toxin.    BOTOX PROCEDURE NOTE FOR MIGRAINE HEADACHE  Contraindications and precautions discussed with patient(above). Aseptic procedure was observed and patient tolerated procedure. Procedure performed by Ihor Austin, AGNP-BC.   The condition has existed for more than 6 months, and pt does not have a diagnosis of ALS, Myasthenia Gravis or Lambert-Eaton Syndrome.  Risks and benefits of injections discussed and pt agrees to proceed with the procedure.  Written consent obtained  These injections are medically necessary. Pt  receives good benefits from these injections. These injections do not cause sedations or hallucinations which the oral therapies may cause.   Description of procedure:  The patient was placed in a sitting position. The standard protocol was used for Botox as follows, with 5 units of Botox injected at each site:  -Procerus muscle, midline injection  -Corrugator muscle, bilateral injection  -Frontalis muscle, bilateral injection, with 2 sites each side, medial injection was performed in the upper one third of the frontalis muscle, in the region vertical from the  medial inferior edge of the superior orbital rim. The lateral injection was again in the upper one third of the forehead vertically above the lateral limbus of the cornea, 1.5 cm lateral to the medial injection site.  -Temporalis  muscle injection, 4 sites, bilaterally. The first injection was 3 cm above the tragus of the ear, second injection site was 1.5 cm to 3 cm up from the first injection site in line with the tragus of the ear. The third injection site was 1.5-3 cm forward between the first 2 injection sites. The fourth injection site was 1.5 cm posterior to the second injection site. 5th site laterally in the temporalis  muscleat the level of the outer canthus.  -Occipitalis muscle injection, 3 sites, bilaterally. The first injection was done one half way between the occipital protuberance and the tip of the mastoid process behind the ear. The second injection site was done lateral and superior to the first, 1 fingerbreadth from the first injection. The third injection site was 1 fingerbreadth superiorly and medially from the first injection site.  -Cervical paraspinal muscle injection, 2 sites, bilaterally. The first injection site was 1 cm from the midline of the cervical spine, 3 cm inferior to the lower border of the occipital protuberance. The second injection site was 1.5 cm superiorly and laterally to the first injection site.  -Trapezius muscle injection was performed at 3 sites, bilaterally. The first injection site was in the upper trapezius muscle halfway between the inflection point of the neck, and the acromion. The second injection site was one half way between the acromion and the first injection site. The third injection was done between the first injection site and the inflection point of the neck.    A total of 200 units of Botox was prepared, 155 units of Botox was injected as documented above, any Botox not injected was wasted. The patient tolerated the procedure well, there were no complications of the above procedure.   Ihor Austin, AGNP-BC  [facility] [address] [place], [place] [address]  Phone [phone] Fax [phone] Note: This document was  prepared with digital dictation and possible smart phrase technology. Any transcriptional errors that result from this process are unintentional.

## 2023-05-02 DIAGNOSIS — M9903 Segmental and somatic dysfunction of lumbar region: Secondary | ICD-10-CM | POA: Diagnosis not present

## 2023-05-02 DIAGNOSIS — M9902 Segmental and somatic dysfunction of thoracic region: Secondary | ICD-10-CM | POA: Diagnosis not present

## 2023-05-02 DIAGNOSIS — M62830 Muscle spasm of back: Secondary | ICD-10-CM | POA: Diagnosis not present

## 2023-05-02 DIAGNOSIS — M9901 Segmental and somatic dysfunction of cervical region: Secondary | ICD-10-CM | POA: Diagnosis not present

## 2023-05-07 ENCOUNTER — Ambulatory Visit (INDEPENDENT_AMBULATORY_CARE_PROVIDER_SITE_OTHER): Admitting: Family Medicine

## 2023-05-07 ENCOUNTER — Encounter: Payer: Self-pay | Admitting: Family Medicine

## 2023-05-07 VITALS — BP 136/76 | HR 96 | Temp 98.2°F | Ht 64.0 in | Wt 159.0 lb

## 2023-05-07 DIAGNOSIS — R519 Headache, unspecified: Secondary | ICD-10-CM

## 2023-05-07 DIAGNOSIS — J0110 Acute frontal sinusitis, unspecified: Secondary | ICD-10-CM

## 2023-05-07 MED ORDER — NURTEC 75 MG PO TBDP: 75.0000 mg | ORAL_TABLET | ORAL | Status: DC | PRN

## 2023-05-07 MED ORDER — AZITHROMYCIN 250 MG PO TABS: ORAL_TABLET | ORAL | 0 refills | Status: AC

## 2023-05-07 MED ORDER — PREDNISONE 20 MG PO TABS: ORAL_TABLET | ORAL | 0 refills | Status: DC

## 2023-05-07 NOTE — Progress Notes (Signed)
 BP 136/76   Pulse 96   Temp 98.2 F (36.8 C)   Ht 5\' 4"  (1.626 m)   Wt 159 lb (72.1 kg)   SpO2 96%   BMI 27.29 kg/m    Subjective:   Patient ID: Selena Barnes, female    DOB: August 02, 1950, 73 y.o.   MRN: [id number]  HPI: MILISSA Barnes is a 73 y.o. female presenting on 05/07/2023 for Cough and sinus drainage   HPI Patient is coming in today complaining of cough and congestion and sinus drainage and pressure and headaches.  She says she has developed a migraine after she has been sick for about 3 or 4 days.  She says her husband was sick previous to her and had similar symptoms but tested for flu COVID and everything and it was negative.  She denies any fevers or chills but is having cough and congestion and now getting down into chest congestion.  She still has a a lot of right ear pressure as well with it.  She has used NyQuil and Tylenol and they do help a little bit with the headache and help her sleep but she is just not getting better.  She denies any fevers or chills or shortness of breath.  Relevant past medical, surgical, family and social history reviewed and updated as indicated. Interim medical history since our last visit reviewed. Allergies and medications reviewed and updated.  Review of Systems  Constitutional:  Negative for chills and fever.  HENT:  Positive for congestion, postnasal drip, rhinorrhea, sinus pressure, sneezing and sore throat. Negative for ear discharge and ear pain.   Eyes:  Negative for pain, redness and visual disturbance.  Respiratory:  Positive for cough. Negative for chest tightness, shortness of breath and wheezing.   Cardiovascular:  Negative for chest pain and leg swelling.  Genitourinary:  Negative for difficulty urinating and dysuria.  Musculoskeletal:  Negative for back pain and gait problem.  Skin:  Negative for rash.  Neurological:  Positive for headaches. Negative for dizziness and light-headedness.  Psychiatric/Behavioral:  Negative for  agitation and behavioral problems.   All other systems reviewed and are negative.   Per HPI unless specifically indicated above   Allergies as of 05/07/2023       Reactions   Sulfa Antibiotics    Sulfacetamide Other (See Comments), Swelling        Medication List        Accurate as of May 07, 2023 11:39 AM. If you have any questions, ask your nurse or doctor.          azithromycin 250 MG tablet Commonly known as: ZITHROMAX Take 2 tablets on day 1, then 1 tablet daily on days 2 through 5 Started by: Ivin Booty A Axell Trigueros   diclofenac 75 MG EC tablet Commonly known as: VOLTAREN Take 1 tablet (75 mg total) by mouth daily as needed.   DULoxetine 60 MG capsule Commonly known as: CYMBALTA Take 1 capsule (60 mg total) by mouth 2 (two) times daily.   esomeprazole 40 MG capsule Commonly known as: NEXIUM Take 1 capsule (40 mg total) by mouth daily at 12 noon.   estradiol 0.1 MG/GM vaginal cream Commonly known as: ESTRACE VAGINAL Place 1 Applicatorful vaginally 3 (three) times a week.   fish oil-omega-3 fatty acids 1000 MG capsule Take 2 g by mouth 2 (two) times daily.   Loratadine 10 MG Caps Take by mouth.   Nurtec 75 MG Tbdp Generic drug: Rimegepant Sulfate Take 1  tablet (75 mg total) by mouth daily as needed. For migraines. Take as close to onset of migraine as possible. One daily maximum.   predniSONE 20 MG tablet Commonly known as: DELTASONE 2 po at same time daily for 5 days Started by: Elige Radon Chrisopher Pustejovsky   promethazine 25 MG tablet Commonly known as: PHENERGAN Take 1 tablet (25 mg total) by mouth every 6 (six) hours as needed for nausea or vomiting.   rosuvastatin 20 MG tablet Commonly known as: Crestor Take 1 tablet (20 mg total) by mouth daily.   Vyepti 100 MG/ML injection Generic drug: Eptinezumab-jjmr Inject 100 mg into the vein every 3 (three) months.         Objective:   BP 136/76   Pulse 96   Temp 98.2 F (36.8 C)   Ht 5\' 4"  (1.626  m)   Wt 159 lb (72.1 kg)   SpO2 96%   BMI 27.29 kg/m   Wt Readings from Last 3 Encounters:  05/07/23 159 lb (72.1 kg)  02/13/23 155 lb (70.3 kg)  01/23/23 151 lb 14.4 oz (68.9 kg)    Physical Exam Vitals reviewed.  Constitutional:      General: She is not in acute distress.    Appearance: She is well-developed. She is not diaphoretic.  HENT:     Right Ear: Ear canal and external ear normal. Tympanic membrane is bulging. Tympanic membrane is not erythematous.     Left Ear: Ear canal and external ear normal. Tympanic membrane is retracted. Tympanic membrane is not erythematous.     Nose: Mucosal edema and rhinorrhea present.     Right Sinus: No maxillary sinus tenderness or frontal sinus tenderness.     Left Sinus: No maxillary sinus tenderness or frontal sinus tenderness.     Mouth/Throat:     Pharynx: Uvula midline. No oropharyngeal exudate or posterior oropharyngeal erythema.     Tonsils: No tonsillar abscesses.  Eyes:     Conjunctiva/sclera: Conjunctivae normal.  Cardiovascular:     Rate and Rhythm: Normal rate and regular rhythm.     Heart sounds: Normal heart sounds. No murmur heard. Pulmonary:     Effort: Pulmonary effort is normal. No respiratory distress.     Breath sounds: Normal breath sounds. No wheezing or rhonchi.  Musculoskeletal:        General: No tenderness. Normal range of motion.  Skin:    General: Skin is warm and dry.     Findings: No rash.  Neurological:     Mental Status: She is alert and oriented to person, place, and time.     Coordination: Coordination normal.  Psychiatric:        Behavior: Behavior normal.       Assessment & Plan:   Problem List Items Addressed This Visit   None Visit Diagnoses       Sinus headache    -  Primary   Relevant Medications   azithromycin (ZITHROMAX) 250 MG tablet   predniSONE (DELTASONE) 20 MG tablet     Acute non-recurrent frontal sinusitis       Relevant Medications   azithromycin (ZITHROMAX) 250 MG  tablet   predniSONE (DELTASONE) 20 MG tablet       Will give azithromycin and prednisone.  Also give a sample for Nurtec due to her not being able to get the prescription from neurology yet.  They should help with migraine and sinus pressure and infection. Follow up plan: Return if symptoms worsen or fail to improve.  Counseling provided for all of the vaccine components No orders of the defined types were placed in this encounter.   Arville Care, MD [facility] 05/07/2023, 11:39 AM

## 2023-05-30 DIAGNOSIS — M9902 Segmental and somatic dysfunction of thoracic region: Secondary | ICD-10-CM | POA: Diagnosis not present

## 2023-05-30 DIAGNOSIS — M9903 Segmental and somatic dysfunction of lumbar region: Secondary | ICD-10-CM | POA: Diagnosis not present

## 2023-05-30 DIAGNOSIS — M62830 Muscle spasm of back: Secondary | ICD-10-CM | POA: Diagnosis not present

## 2023-05-30 DIAGNOSIS — M9901 Segmental and somatic dysfunction of cervical region: Secondary | ICD-10-CM | POA: Diagnosis not present

## 2023-06-11 ENCOUNTER — Ambulatory Visit: Admitting: Neurology

## 2023-06-11 ENCOUNTER — Telehealth: Payer: Self-pay | Admitting: Neurology

## 2023-06-11 NOTE — Telephone Encounter (Signed)
 Called patient and rescheduled appointment with Dr. Tresia Fruit for 09/03/23 at 11am for a VV

## 2023-06-11 NOTE — Telephone Encounter (Signed)
 Really nice patient is getting vyepti  06/19/2023 and just had her first botox  in March. I called her and we agreed to push out the virtual visit for a few months. Can you call and reschedule her with me sometime in July please. Virtual please, thank you

## 2023-06-19 DIAGNOSIS — G43711 Chronic migraine without aura, intractable, with status migrainosus: Secondary | ICD-10-CM | POA: Diagnosis not present

## 2023-06-27 DIAGNOSIS — M62830 Muscle spasm of back: Secondary | ICD-10-CM | POA: Diagnosis not present

## 2023-06-27 DIAGNOSIS — M9903 Segmental and somatic dysfunction of lumbar region: Secondary | ICD-10-CM | POA: Diagnosis not present

## 2023-06-27 DIAGNOSIS — M9901 Segmental and somatic dysfunction of cervical region: Secondary | ICD-10-CM | POA: Diagnosis not present

## 2023-06-27 DIAGNOSIS — M9902 Segmental and somatic dysfunction of thoracic region: Secondary | ICD-10-CM | POA: Diagnosis not present

## 2023-07-19 ENCOUNTER — Other Ambulatory Visit (HOSPITAL_COMMUNITY): Payer: Self-pay

## 2023-07-19 ENCOUNTER — Telehealth: Payer: Self-pay | Admitting: Neurology

## 2023-07-19 ENCOUNTER — Ambulatory Visit: Admitting: Adult Health

## 2023-07-19 DIAGNOSIS — G43711 Chronic migraine without aura, intractable, with status migrainosus: Secondary | ICD-10-CM | POA: Diagnosis not present

## 2023-07-19 MED ORDER — ONABOTULINUMTOXINA 100 UNITS IJ SOLR
100.0000 [IU] | Freq: Once | INTRAMUSCULAR | Status: AC
  Administered 2023-07-19: 155 [IU] via INTRAMUSCULAR

## 2023-07-19 NOTE — Progress Notes (Signed)
 Botox - 200 units x 1 vial Lot: [id number] Expiration: 12/2025 NDC: 8657-8469-62  Bacteriostatic 0.9% Sodium Chloride - 4 mL  Lot: [id number] Expiration: 0630/2026 NDC: 3244-0102-72  Dx: g43.711  B/B Witnessed by Narda Bacon

## 2023-07-19 NOTE — Telephone Encounter (Signed)
 Pt came into the office today for botox  and was talking about how she never was able to start the nurtec. She showed me the message she was getting indicating that [facility] needs to have a script printed. Informed the pt it likely needs a prior authorization. I do not see anything documented indicating a PA had been completed. Can we try and see about that? Thank you

## 2023-07-19 NOTE — Progress Notes (Signed)
 Update 04/23/2023 JM: Patient is being seen for repeat Botox  injection, prior initial injection 04/23/2023.  She has not noticed any significant improvement of migraines after initial Botox  injection, reports during the month of May, she experienced daily headaches with about 20 of those migrainous.  Of note, she reported about 8-15 migraine days per month prior to initiating Botox  after initial Vyepti  infusion (prior to Vyepti , she was having 25-30 migraine days per month).  She did receive second Vyepti  100mg  infusion last month and is scheduled for third infusion in August.  Question persistent/worsening headaches possibly due to to allergens and constant changes in barometric pressure but also advised botox  can take up to 3 injections to see full benefit.  Could also consider increasing Vyepti  dosage to 300 mg, she has follow-up visit with Dr. Tresia Fruit in July and this can be further discussed then. She agrees with proceeding with repeat injection today.  She has not been able to obtain Nurtec due to pharmacy, Gabriel John, RN plans on looking into this. She was provided samples today.  She will return in 3 months for repeat Botox  injection.       Consent Form Botulism Toxin Injection For Chronic Migraine    Reviewed orally with patient, additionally signature is on file:  Botulism toxin has been approved by the Federal drug administration for treatment of chronic migraine. Botulism toxin does not cure chronic migraine and it may not be effective in some patients.  The administration of botulism toxin is accomplished by injecting a small amount of toxin into the muscles of the neck and head. Dosage must be titrated for each individual. Any benefits resulting from botulism toxin tend to wear off after 3 months with a repeat injection required if benefit is to be maintained. Injections are usually done every 3-4 months with maximum effect peak achieved by about 2 or 3 weeks. Botulism toxin is expensive  and you should be sure of what costs you will incur resulting from the injection.  The side effects of botulism toxin use for chronic migraine may include:   -Transient, and usually mild, facial weakness with facial injections  -Transient, and usually mild, head or neck weakness with head/neck injections  -Reduction or loss of forehead facial animation due to forehead muscle weakness  -Eyelid drooping  -Dry eye  -Pain at the site of injection or bruising at the site of injection  -Double vision  -Potential unknown long term risks   Contraindications: You should not have Botox  if you are pregnant, nursing, allergic to albumin, have an infection, skin condition, or muscle weakness at the site of the injection, or have myasthenia gravis, Lambert-Eaton syndrome, or ALS.  It is also possible that as with any injection, there may be an allergic reaction or no effect from the medication. Reduced effectiveness after repeated injections is sometimes seen and rarely infection at the injection site may occur. All care will be taken to prevent these side effects. If therapy is given over a long time, atrophy and wasting in the muscle injected may occur. Occasionally the patient's become refractory to treatment because they develop antibodies to the toxin. In this event, therapy needs to be modified.  I have read the above information and consent to the administration of botulism toxin.    BOTOX  PROCEDURE NOTE FOR MIGRAINE HEADACHE  Contraindications and precautions discussed with patient(above). Aseptic procedure was observed and patient tolerated procedure. Procedure performed by Johny Nap, AGNP-BC.   The condition has existed for  more than 6 months, and pt does not have a diagnosis of ALS, Myasthenia Gravis or Lambert-Eaton Syndrome.  Risks and benefits of injections discussed and pt agrees to proceed with the procedure.  Written consent obtained  These injections are medically necessary. Pt   receives good benefits from these injections. These injections do not cause sedations or hallucinations which the oral therapies may cause.   Description of procedure:  The patient was placed in a sitting position. The standard protocol was used for Botox  as follows, with 5 units of Botox  injected at each site:  -Procerus muscle, midline injection  -Corrugator muscle, bilateral injection  -Frontalis muscle, bilateral injection, with 2 sites each side, medial injection was performed in the upper one third of the frontalis muscle, in the region vertical from the medial inferior edge of the superior orbital rim. The lateral injection was again in the upper one third of the forehead vertically above the lateral limbus of the cornea, 1.5 cm lateral to the medial injection site.  -Temporalis muscle injection, 4 sites, bilaterally. The first injection was 3 cm above the tragus of the ear, second injection site was 1.5 cm to 3 cm up from the first injection site in line with the tragus of the ear. The third injection site was 1.5-3 cm forward between the first 2 injection sites. The fourth injection site was 1.5 cm posterior to the second injection site. 5th site laterally in the temporalis  muscleat the level of the outer canthus.  -Occipitalis muscle injection, 3 sites, bilaterally. The first injection was done one half way between the occipital protuberance and the tip of the mastoid process behind the ear. The second injection site was done lateral and superior to the first, 1 fingerbreadth from the first injection. The third injection site was 1 fingerbreadth superiorly and medially from the first injection site.  -Cervical paraspinal muscle injection, 2 sites, bilaterally. The first injection site was 1 cm from the midline of the cervical spine, 3 cm inferior to the lower border of the occipital protuberance. The second injection site was 1.5 cm superiorly and laterally to the first injection  site.  -Trapezius muscle injection was performed at 3 sites, bilaterally. The first injection site was in the upper trapezius muscle halfway between the inflection point of the neck, and the acromion. The second injection site was one half way between the acromion and the first injection site. The third injection was done between the first injection site and the inflection point of the neck.    A total of 200 units of Botox  was prepared, 155 units of Botox  was injected as documented above, any Botox  not injected was wasted. The patient tolerated the procedure well, there were no complications of the above procedure.   Johny Nap, AGNP-BC  [facility] [address] [place], [place] [address]  Phone [phone] Fax [phone] Note: This document was prepared with digital dictation and possible smart phrase technology. Any transcriptional errors that result from this process are unintentional.

## 2023-07-20 ENCOUNTER — Other Ambulatory Visit (HOSPITAL_COMMUNITY): Payer: Self-pay

## 2023-07-20 ENCOUNTER — Telehealth: Payer: Self-pay

## 2023-07-20 NOTE — Telephone Encounter (Signed)
 I was asked to do a PA for Nurtec but do not see the RX in chart with a Qty etc-please advise

## 2023-07-23 ENCOUNTER — Other Ambulatory Visit (HOSPITAL_COMMUNITY): Payer: Self-pay

## 2023-07-23 MED ORDER — NURTEC 75 MG PO TBDP: 75.0000 mg | ORAL_TABLET | ORAL | 11 refills | Status: AC | PRN

## 2023-07-23 NOTE — Telephone Encounter (Signed)
 Order placed. Thank you.

## 2023-07-24 ENCOUNTER — Other Ambulatory Visit (HOSPITAL_COMMUNITY): Payer: Self-pay

## 2023-07-24 ENCOUNTER — Telehealth: Payer: Self-pay

## 2023-07-24 NOTE — Telephone Encounter (Signed)
   I will outreach insurance to submit PA.

## 2023-07-24 NOTE — Telephone Encounter (Signed)
 Pharmacy Patient Advocate Encounter   Received notification from Physician's Office that prior authorization for Nurtec is required/requested.   Insurance verification completed.   The patient is insured through [employer] .   Per test claim: The current 30 day co-pay is, $388.08 for 16 tablets and $209.09 for 8 tablets.  No PA needed at this time. This test claim was processed through [facility]- copay amounts may vary at other pharmacies due to pharmacy/plan contracts, or as the patient moves through the different stages of their insurance plan.    Per insurance call there is no PA needed-the med is covered by the plan but has a high copay-can not do tier exception.

## 2023-07-24 NOTE — Telephone Encounter (Addendum)
 ERROR

## 2023-07-25 DIAGNOSIS — M9902 Segmental and somatic dysfunction of thoracic region: Secondary | ICD-10-CM | POA: Diagnosis not present

## 2023-07-25 DIAGNOSIS — M9903 Segmental and somatic dysfunction of lumbar region: Secondary | ICD-10-CM | POA: Diagnosis not present

## 2023-07-25 DIAGNOSIS — M62830 Muscle spasm of back: Secondary | ICD-10-CM | POA: Diagnosis not present

## 2023-07-25 DIAGNOSIS — M9901 Segmental and somatic dysfunction of cervical region: Secondary | ICD-10-CM | POA: Diagnosis not present

## 2023-07-31 DIAGNOSIS — M795 Residual foreign body in soft tissue: Secondary | ICD-10-CM | POA: Diagnosis not present

## 2023-07-31 DIAGNOSIS — M272 Inflammatory conditions of jaws: Secondary | ICD-10-CM | POA: Diagnosis not present

## 2023-08-21 ENCOUNTER — Other Ambulatory Visit: Payer: Self-pay | Admitting: Family Medicine

## 2023-08-21 DIAGNOSIS — Z1231 Encounter for screening mammogram for malignant neoplasm of breast: Secondary | ICD-10-CM

## 2023-08-22 DIAGNOSIS — M9901 Segmental and somatic dysfunction of cervical region: Secondary | ICD-10-CM | POA: Diagnosis not present

## 2023-08-22 DIAGNOSIS — M62830 Muscle spasm of back: Secondary | ICD-10-CM | POA: Diagnosis not present

## 2023-08-22 DIAGNOSIS — M9903 Segmental and somatic dysfunction of lumbar region: Secondary | ICD-10-CM | POA: Diagnosis not present

## 2023-08-22 DIAGNOSIS — M9902 Segmental and somatic dysfunction of thoracic region: Secondary | ICD-10-CM | POA: Diagnosis not present

## 2023-09-03 ENCOUNTER — Telehealth (INDEPENDENT_AMBULATORY_CARE_PROVIDER_SITE_OTHER): Admitting: Neurology

## 2023-09-03 DIAGNOSIS — G43711 Chronic migraine without aura, intractable, with status migrainosus: Secondary | ICD-10-CM | POA: Diagnosis not present

## 2023-09-03 DIAGNOSIS — I639 Cerebral infarction, unspecified: Secondary | ICD-10-CM

## 2023-09-03 DIAGNOSIS — G4719 Other hypersomnia: Secondary | ICD-10-CM

## 2023-09-03 DIAGNOSIS — I679 Cerebrovascular disease, unspecified: Secondary | ICD-10-CM

## 2023-09-03 DIAGNOSIS — R519 Headache, unspecified: Secondary | ICD-10-CM
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
## 2023-09-03 NOTE — Patient Instructions (Signed)
 Sleep evaluation Increase Vyepti  to 300mg  Continue Botox  for migraines Continue Nurtec

## 2023-09-04 ENCOUNTER — Encounter: Payer: Self-pay | Admitting: Neurology

## 2023-09-04 DIAGNOSIS — G43711 Chronic migraine without aura, intractable, with status migrainosus: Secondary | ICD-10-CM | POA: Insufficient documentation

## 2023-09-04 DIAGNOSIS — I679 Cerebrovascular disease, unspecified: Secondary | ICD-10-CM | POA: Insufficient documentation

## 2023-09-04 MED ORDER — SODIUM CHLORIDE 0.9 % IV SOLN: 100.0000 mg | INTRAVENOUS | 4 refills | Status: DC

## 2023-09-04 MED ORDER — ASPIRIN 81 MG PO TBEC: 81.0000 mg | DELAYED_RELEASE_TABLET | Freq: Every day | ORAL | Status: AC

## 2023-09-05 ENCOUNTER — Telehealth: Payer: Self-pay | Admitting: *Deleted

## 2023-09-05 NOTE — Telephone Encounter (Signed)
 Placed order in provider box to be signed

## 2023-09-05 NOTE — Telephone Encounter (Signed)
-----   Message from Onetha KATHEE Epp sent at 09/04/2023  5:24 PM EDT ----- Regarding: pleas eincrease vyepti  to 300mg  I updated Selena Barnes's note on 09/04/2023 to ask for increase in Vyepti  to 300mg . DOB 29-Dec-1950 thank you

## 2023-09-06 NOTE — Telephone Encounter (Signed)
 Order signed.  Given to intrafusion 09-05-2023.

## 2023-09-07 ENCOUNTER — Telehealth: Payer: Self-pay | Admitting: Family Medicine

## 2023-09-07 DIAGNOSIS — D485 Neoplasm of uncertain behavior of skin: Secondary | ICD-10-CM | POA: Diagnosis not present

## 2023-09-07 DIAGNOSIS — L570 Actinic keratosis: Secondary | ICD-10-CM | POA: Diagnosis not present

## 2023-09-07 DIAGNOSIS — D1801 Hemangioma of skin and subcutaneous tissue: Secondary | ICD-10-CM | POA: Diagnosis not present

## 2023-09-07 DIAGNOSIS — L814 Other melanin hyperpigmentation: Secondary | ICD-10-CM | POA: Diagnosis not present

## 2023-09-07 DIAGNOSIS — D2112 Benign neoplasm of connective and other soft tissue of left upper limb, including shoulder: Secondary | ICD-10-CM | POA: Diagnosis not present

## 2023-09-07 DIAGNOSIS — I788 Other diseases of capillaries: Secondary | ICD-10-CM | POA: Diagnosis not present

## 2023-09-07 DIAGNOSIS — D225 Melanocytic nevi of trunk: Secondary | ICD-10-CM | POA: Diagnosis not present

## 2023-09-07 DIAGNOSIS — L819 Disorder of pigmentation, unspecified: Secondary | ICD-10-CM | POA: Diagnosis not present

## 2023-09-07 DIAGNOSIS — D2271 Melanocytic nevi of right lower limb, including hip: Secondary | ICD-10-CM | POA: Diagnosis not present

## 2023-09-07 DIAGNOSIS — D2272 Melanocytic nevi of left lower limb, including hip: Secondary | ICD-10-CM | POA: Diagnosis not present

## 2023-09-07 DIAGNOSIS — L821 Other seborrheic keratosis: Secondary | ICD-10-CM | POA: Diagnosis not present

## 2023-09-07 NOTE — Telephone Encounter (Signed)
 Copied from CRM [id number]. Topic: Referral - Request for Referral >> Sep 07, 2023  9:08 AM Everette C wrote: Did the patient discuss referral with their provider in the last year? Yes (If No - schedule appointment) (If Yes - send message)  Appointment offered? Yes  Type of order/referral and detailed reason for visit: Otolaryngology, sore on right side of nose and right ear hearing concerns   Preference of office, provider, location: patient has no preference   If referral order, have you been seen by this specialty before? No (If Yes, this issue or another issue? When? Where?  Can we respond through MyChart? No

## 2023-09-07 NOTE — Addendum Note (Signed)
 Addended by: MICHELINE ROSINA FALCON on: 09/07/2023 10:03 AM   Modules accepted: Orders

## 2023-09-12 NOTE — Telephone Encounter (Signed)
 To be able to place a referral we would have to see her first because we have not seen her for this issue.

## 2023-09-12 NOTE — Telephone Encounter (Signed)
 Appt scheduled 8/1 at 8:55am. Pt aware.

## 2023-09-13 NOTE — Addendum Note (Signed)
 Addended by: Ivonne Freeburg B on: 09/13/2023 01:45 PM   Modules accepted: Level of Service

## 2023-09-14 ENCOUNTER — Ambulatory Visit (INDEPENDENT_AMBULATORY_CARE_PROVIDER_SITE_OTHER): Admitting: Family Medicine

## 2023-09-14 ENCOUNTER — Encounter: Payer: Self-pay | Admitting: Family Medicine

## 2023-09-14 VITALS — BP 128/80 | HR 71 | Ht 64.0 in | Wt 159.0 lb

## 2023-09-14 DIAGNOSIS — J358 Other chronic diseases of tonsils and adenoids: Secondary | ICD-10-CM

## 2023-09-14 DIAGNOSIS — L739 Follicular disorder, unspecified: Secondary | ICD-10-CM

## 2023-09-14 NOTE — Progress Notes (Signed)
 BP 128/80   Pulse 71   Ht 5' 4 (1.626 m)   Wt 159 lb (72.1 kg)   BMI 27.29 kg/m    Subjective:   Patient ID: Selena Barnes, female    DOB: 1950-04-12, 73 y.o.   MRN: [id number]  HPI: Selena Barnes is a 73 y.o. female presenting on 09/14/2023 for Ear Pain, Facial Pain (Right nasal- sores and bleeding), and Dysphagia (Feels like something is on the right side of her throat)   Discussed the use of AI scribe software for clinical note transcription with the patient, who gave verbal consent to proceed.  History of Present Illness   Selena Barnes is a 73 year old female who presents with nasal sores and right ear pain.  She has experienced sores on the right side of her nose that bleed intermittently for about a year. Vaseline provides temporary relief, but the sores recur. Nasal sprays and a neti pot have also provided only temporary relief.  She has right ear pain, particularly when lying on that side, persisting for about a year. Her husband has noticed a decline in her hearing. The pain is described as an ache similar to a toothache and occurs even when not lying on the ear. No ear drainage is present.  She has a history of frequent earaches during childhood but not as an adult. No fever, chills, or body aches. She experiences a cough that is sometimes productive and sometimes dry, and she has a sore throat that worsens with swallowing and occasionally causes her to choke, even when singing in the choir.  She takes reflux medication twice a day, which she believes is helping, although she sometimes experiences symptoms before taking the morning dose. She saw an ear, nose, and throat doctor two to three years ago for similar symptoms, but no cause was identified.  She reports facial flushing after consuming alcohol, which she noticed after drinking a small amount of wine at her grandson's wedding. This was particularly noticeable at her grandson's wedding after consuming a small amount of  wine.          Relevant past medical, surgical, family and social history reviewed and updated as indicated. Interim medical history since our last visit reviewed. Allergies and medications reviewed and updated.  Review of Systems  Constitutional:  Negative for chills and fever.  HENT:  Positive for ear pain, sore throat and trouble swallowing. Negative for congestion and ear discharge.   Eyes:  Negative for redness and visual disturbance.  Respiratory:  Positive for cough. Negative for chest tightness and shortness of breath.   Cardiovascular:  Negative for chest pain and leg swelling.  Genitourinary:  Negative for difficulty urinating and dysuria.  Musculoskeletal:  Negative for back pain and gait problem.  Skin:  Negative for rash.  Neurological:  Negative for light-headedness and headaches.  Psychiatric/Behavioral:  Negative for agitation and behavioral problems.   All other systems reviewed and are negative.   Per HPI unless specifically indicated above   Allergies as of 09/14/2023       Reactions   Sulfa Antibiotics    Sulfacetamide Other (See Comments), Swelling        Medication List        Accurate as of September 14, 2023  9:24 AM. If you have any questions, ask your nurse or doctor.          STOP taking these medications    sodium chloride  0.9 % SOLN 100  mL with Eptinezumab -jjmr 100 MG/ML SOLN 100 mg Stopped by: Fonda LABOR Lorita Forinash       TAKE these medications    aspirin  EC 81 MG tablet Take 1 tablet (81 mg total) by mouth Selena. Swallow whole.   diclofenac  75 MG EC tablet Commonly known as: VOLTAREN  Take 1 tablet (75 mg total) by mouth Selena as needed.   DULoxetine  60 MG capsule Commonly known as: CYMBALTA  Take 1 capsule (60 mg total) by mouth 2 (two) times Selena.   esomeprazole  40 MG capsule Commonly known as: NEXIUM  Take 1 capsule (40 mg total) by mouth Selena at 12 noon.   estradiol  0.1 MG/GM vaginal cream Commonly known as: ESTRACE   VAGINAL Place 1 Applicatorful vaginally 3 (three) times a week.   fish oil-omega-3 fatty acids 1000 MG capsule Take 2 g by mouth 2 (two) times Selena.   Loratadine 10 MG Caps Take by mouth.   Nurtec 75 MG Tbdp Generic drug: Rimegepant Sulfate Take 1 tablet (75 mg total) by mouth as needed.   promethazine  25 MG tablet Commonly known as: PHENERGAN  Take 1 tablet (25 mg total) by mouth every 6 (six) hours as needed for nausea or vomiting.   rosuvastatin  20 MG tablet Commonly known as: Crestor  Take 1 tablet (20 mg total) by mouth Selena.         Objective:   BP 128/80   Pulse 71   Ht 5' 4 (1.626 m)   Wt 159 lb (72.1 kg)   BMI 27.29 kg/m   Wt Readings from Last 3 Encounters:  09/14/23 159 lb (72.1 kg)  05/07/23 159 lb (72.1 kg)  02/13/23 155 lb (70.3 kg)    Physical Exam Physical Exam   HEENT: Ears normal, no infection. No nasal sores present. Tonsil stones present on right side. NECK: Thyroid  normal. No cervical lymphadenopathy. Tenderness on right side of throat. CHEST: Lungs clear to auscultation bilaterally. 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 No ear infection noted. - Perform salt water gargles regularly. - Continue Claritin Selena. - Add Benadryl at nighttime. - Refer to ENT for further evaluation and management. - Provided educational materials on tonsil stones.  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 Possible bacterial infection. - Discuss with ENT about potential cleansing wash for  nasal sores.        Follow up plan: Return if symptoms worsen or fail to improve.  Counseling provided for all of the vaccine components Orders Placed This Encounter  Procedures   Ambulatory referral to ENT    Fonda Levins, MD [facility] 09/14/2023, 9:24 AM

## 2023-09-14 NOTE — Patient Instructions (Signed)
  VISIT SUMMARY: Today, you were seen for nasal sores and right ear pain that have been bothering you for about a year. We discussed your symptoms, including the bleeding sores on your nose and the ear pain that worsens when lying on your right side. We also reviewed your history of earaches, throat soreness, and facial flushing after consuming alcohol.  YOUR PLAN: -TONSIL STONES (RIGHT SIDE) WITH ASSOCIATED RIGHT EAR PAIN AND CHRONIC THROAT SORENESS: Tonsil stones are small, hard formations that can develop in the tonsils and cause discomfort, including throat soreness and ear pain. You should perform salt water gargles regularly, continue taking Claritin daily, and add Benadryl at nighttime. We will refer you to an ENT specialist for further evaluation and management. Educational materials on tonsil stones were provided.  -RECURRENT RIGHT ANTERIOR NASAL SORES WITH INTERMITTENT BLEEDING: Recurrent nasal sores can be caused by a bacterial infection and lead to intermittent bleeding. We discussed the possibility of using a cleansing wash for your nasal sores with an ENT specialist.  INSTRUCTIONS: Please follow up with an ENT specialist for further evaluation and management of your tonsil stones and nasal sores. Continue with the recommended medications and salt water gargles as discussed.                      Contains text generated by Abridge.                                 Contains text generated by Abridge.

## 2023-09-18 ENCOUNTER — Other Ambulatory Visit: Payer: Self-pay | Admitting: Medical Genetics

## 2023-09-18 DIAGNOSIS — G43711 Chronic migraine without aura, intractable, with status migrainosus: Secondary | ICD-10-CM | POA: Diagnosis not present

## 2023-09-19 ENCOUNTER — Ambulatory Visit
Admission: RE | Admit: 2023-09-19 | Discharge: 2023-09-19 | Disposition: A | Discharge status: Home / Self Care | Source: Ambulatory Visit | Attending: Family Medicine | Admitting: Family Medicine

## 2023-09-19 DIAGNOSIS — Z1231 Encounter for screening mammogram for malignant neoplasm of breast: Secondary | ICD-10-CM

## 2023-09-19 DIAGNOSIS — M9902 Segmental and somatic dysfunction of thoracic region: Secondary | ICD-10-CM | POA: Diagnosis not present

## 2023-09-19 DIAGNOSIS — M9901 Segmental and somatic dysfunction of cervical region: Secondary | ICD-10-CM | POA: Diagnosis not present

## 2023-09-19 DIAGNOSIS — M9903 Segmental and somatic dysfunction of lumbar region: Secondary | ICD-10-CM | POA: Diagnosis not present

## 2023-09-19 DIAGNOSIS — M62830 Muscle spasm of back: Secondary | ICD-10-CM | POA: Diagnosis not present

## 2023-09-20 ENCOUNTER — Other Ambulatory Visit (HOSPITAL_COMMUNITY)
Admission: RE | Admit: 2023-09-20 | Discharge: 2023-09-20 | Disposition: A | Discharge status: Home / Self Care | Payer: Self-pay | Source: Ambulatory Visit | Attending: Medical Genetics | Admitting: Medical Genetics

## 2023-09-24 ENCOUNTER — Other Ambulatory Visit: Payer: Self-pay | Admitting: Family Medicine

## 2023-09-24 DIAGNOSIS — R928 Other abnormal and inconclusive findings on diagnostic imaging of breast: Secondary | ICD-10-CM

## 2023-09-29 ENCOUNTER — Ambulatory Visit
Admission: RE | Admit: 2023-09-29 | Discharge: 2023-09-29 | Disposition: A | Discharge status: Home / Self Care | Source: Ambulatory Visit | Attending: Family Medicine | Admitting: Family Medicine

## 2023-09-29 ENCOUNTER — Ambulatory Visit

## 2023-09-29 DIAGNOSIS — R928 Other abnormal and inconclusive findings on diagnostic imaging of breast: Secondary | ICD-10-CM

## 2023-09-30 LAB — GENECONNECT MOLECULAR SCREEN: Genetic Analysis Overall Interpretation: NEGATIVE

## 2023-10-03 ENCOUNTER — Ambulatory Visit (INDEPENDENT_AMBULATORY_CARE_PROVIDER_SITE_OTHER): Admitting: Neurology

## 2023-10-03 ENCOUNTER — Encounter: Payer: Self-pay | Admitting: Neurology

## 2023-10-03 VITALS — BP 120/74 | HR 87 | Ht 64.0 in | Wt 158.0 lb

## 2023-10-03 DIAGNOSIS — R252 Cramp and spasm: Secondary | ICD-10-CM | POA: Insufficient documentation

## 2023-10-03 DIAGNOSIS — G478 Other sleep disorders: Secondary | ICD-10-CM | POA: Diagnosis not present

## 2023-10-03 DIAGNOSIS — R519 Headache, unspecified: Secondary | ICD-10-CM | POA: Diagnosis not present

## 2023-10-03 DIAGNOSIS — R0683 Snoring: Secondary | ICD-10-CM | POA: Insufficient documentation
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
## 2023-10-03 NOTE — Patient Instructions (Signed)
 ASSESSMENT AND PLAN :   73 y.o. year old female  here with:     1) chronic posttraumatic headaches, morning headaches - only recently improved on Vyepti .     Snoring , no witnessed apneas.  Former Education officer, [employer].    2) BMI 27,  neck 14.5 and not excessively daytime sleepy, just not fully refreshed. Could nap but has no imperative urge to do so.      3) leg cramping interrupting sleep. -  I recommended magnesium  po 200 - 500 mg at bedtime.    I will order a sleep study by HST.  This is an apnea/ hypoxia screening tool, if negative may consider in lab study to look at limb movements.      I plan to follow up prn  through our NP within 4-5  months. If the sleep study reveals an organic diagnosis.        I would like to thank Dettinger, Fonda LABOR, MD and Ines Onetha NOVAK, Md [address] [place],  [place] [address] for allowing me to meet with and to take care of this pleasant patient.    Discussion of sleep hygiene setting bedtime and rise time,  hot shower  before bed time, no screen light in the bedroom, the bedroom should be cool, quiet and dark.    Magnesium  po at bedtime.    Cut down on caffeine. Night lights should illuminate the floor not shine into your eyes. Golden glow  light is less intrusive than blue or cold light.  Read in a book with pages, not on a device. Consider audio books and soothing  sound -scapes.

## 2023-10-17 ENCOUNTER — Ambulatory Visit (INDEPENDENT_AMBULATORY_CARE_PROVIDER_SITE_OTHER): Admitting: Adult Health

## 2023-10-17 VITALS — BP 142/87 | HR 84

## 2023-10-17 DIAGNOSIS — M9901 Segmental and somatic dysfunction of cervical region: Secondary | ICD-10-CM | POA: Diagnosis not present

## 2023-10-17 DIAGNOSIS — M62830 Muscle spasm of back: Secondary | ICD-10-CM | POA: Diagnosis not present

## 2023-10-17 DIAGNOSIS — G43711 Chronic migraine without aura, intractable, with status migrainosus: Secondary | ICD-10-CM

## 2023-10-17 DIAGNOSIS — M9903 Segmental and somatic dysfunction of lumbar region: Secondary | ICD-10-CM | POA: Diagnosis not present

## 2023-10-17 DIAGNOSIS — M9902 Segmental and somatic dysfunction of thoracic region: Secondary | ICD-10-CM | POA: Diagnosis not present

## 2023-10-17 MED ORDER — ONABOTULINUMTOXINA 200 UNITS IJ SOLR
155.0000 [IU] | Freq: Once | INTRAMUSCULAR | Status: AC
Start: 2023-10-17 — End: 2023-10-17
  Administered 2023-10-17: 155 [IU] via INTRAMUSCULAR

## 2023-10-17 NOTE — Progress Notes (Signed)
 Botox - 200 units x 1 vial Lot: [id number] Expiration: 12/2025 NDC: 9976-6078-97   Bacteriostatic 0.9% Sodium Chloride - 4 mL  Lot: [id number] Expiration: 12/13/2024 NDC: 9590-8033-97   Dx: h56.288  B/B Witnessed by Augustin NOVAK

## 2023-10-17 NOTE — Progress Notes (Signed)
 Update 10/17/2023 JM: Patient is being seen for third Botox  injection, prior injection 07/19/2023.  She also continues to receive Vyepti  infusions, dosage recently increased to 300 mg for 3rd infusion in August.  She has noticed significant improvement of migraine headaches with >50% improvement of migraine frequency and severity.  She reports having 6 migraines over the past month (previously 25-30 migraine days per month!!). Use of Nurtec with benefit.  Tolerated procedure well today.  She will return in 3 months for repeat Botox  injection.     Consent Form Botulism Toxin Injection For Chronic Migraine    Reviewed orally with patient, additionally signature is on file:  Botulism toxin has been approved by the Federal drug administration for treatment of chronic migraine. Botulism toxin does not cure chronic migraine and it may not be effective in some patients.  The administration of botulism toxin is accomplished by injecting a small amount of toxin into the muscles of the neck and head. Dosage must be titrated for each individual. Any benefits resulting from botulism toxin tend to wear off after 3 months with a repeat injection required if benefit is to be maintained. Injections are usually done every 3-4 months with maximum effect peak achieved by about 2 or 3 weeks. Botulism toxin is expensive and you should be sure of what costs you will incur resulting from the injection.  The side effects of botulism toxin use for chronic migraine may include:   -Transient, and usually mild, facial weakness with facial injections  -Transient, and usually mild, head or neck weakness with head/neck injections  -Reduction or loss of forehead facial animation due to forehead muscle weakness  -Eyelid drooping  -Dry eye  -Pain at the site of injection or bruising at the site of injection  -Double vision  -Potential unknown long term risks   Contraindications: You should not have Botox  if you are  pregnant, nursing, allergic to albumin, have an infection, skin condition, or muscle weakness at the site of the injection, or have myasthenia gravis, Lambert-Eaton syndrome, or ALS.  It is also possible that as with any injection, there may be an allergic reaction or no effect from the medication. Reduced effectiveness after repeated injections is sometimes seen and rarely infection at the injection site may occur. All care will be taken to prevent these side effects. If therapy is given over a long time, atrophy and wasting in the muscle injected may occur. Occasionally the patient's become refractory to treatment because they develop antibodies to the toxin. In this event, therapy needs to be modified.  I have read the above information and consent to the administration of botulism toxin.    BOTOX  PROCEDURE NOTE FOR MIGRAINE HEADACHE  Contraindications and precautions discussed with patient(above). Aseptic procedure was observed and patient tolerated procedure. Procedure performed by Harlene Bogaert, AGNP-BC.   The condition has existed for more than 6 months, and pt does not have a diagnosis of ALS, Myasthenia Gravis or Lambert-Eaton Syndrome.  Risks and benefits of injections discussed and pt agrees to proceed with the procedure.  Written consent obtained  These injections are medically necessary. Pt  receives good benefits from these injections. These injections do not cause sedations or hallucinations which the oral therapies may cause.   Description of procedure:  The patient was placed in a sitting position. The standard protocol was used for Botox  as follows, with 5 units of Botox  injected at each site:  -Procerus muscle, midline injection  -Corrugator muscle, bilateral  injection  -Frontalis muscle, bilateral injection, with 2 sites each side, medial injection was performed in the upper one third of the frontalis muscle, in the region vertical from the medial inferior edge of the  superior orbital rim. The lateral injection was again in the upper one third of the forehead vertically above the lateral limbus of the cornea, 1.5 cm lateral to the medial injection site.  -Temporalis muscle injection, 4 sites, bilaterally. The first injection was 3 cm above the tragus of the ear, second injection site was 1.5 cm to 3 cm up from the first injection site in line with the tragus of the ear. The third injection site was 1.5-3 cm forward between the first 2 injection sites. The fourth injection site was 1.5 cm posterior to the second injection site. 5th site laterally in the temporalis  muscleat the level of the outer canthus.  -Occipitalis muscle injection, 3 sites, bilaterally. The first injection was done one half way between the occipital protuberance and the tip of the mastoid process behind the ear. The second injection site was done lateral and superior to the first, 1 fingerbreadth from the first injection. The third injection site was 1 fingerbreadth superiorly and medially from the first injection site.  -Cervical paraspinal muscle injection, 2 sites, bilaterally. The first injection site was 1 cm from the midline of the cervical spine, 3 cm inferior to the lower border of the occipital protuberance. The second injection site was 1.5 cm superiorly and laterally to the first injection site.  -Trapezius muscle injection was performed at 3 sites, bilaterally. The first injection site was in the upper trapezius muscle halfway between the inflection point of the neck, and the acromion. The second injection site was one half way between the acromion and the first injection site. The third injection was done between the first injection site and the inflection point of the neck.    A total of 200 units of Botox  was prepared, 155 units of Botox  was injected as documented above, any Botox  not injected was wasted. The patient tolerated the procedure well, there were no complications of the  above procedure.   Harlene Bogaert, AGNP-BC  [facility] [address] [place], [place] [address]  Phone [phone] Fax [phone] Note: This document was prepared with digital dictation and possible smart phrase technology. Any transcriptional errors that result from this process are unintentional.

## 2023-11-13 ENCOUNTER — Ambulatory Visit: Admitting: Neurology

## 2023-11-13 DIAGNOSIS — R0683 Snoring: Secondary | ICD-10-CM

## 2023-11-13 DIAGNOSIS — R519 Headache, unspecified: Secondary | ICD-10-CM

## 2023-11-13 DIAGNOSIS — G4733 Obstructive sleep apnea (adult) (pediatric): Secondary | ICD-10-CM | POA: Diagnosis not present

## 2023-11-13 DIAGNOSIS — G478 Other sleep disorders: Secondary | ICD-10-CM

## 2023-11-13 DIAGNOSIS — R252 Cramp and spasm: Secondary | ICD-10-CM
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
## 2023-11-19 ENCOUNTER — Ambulatory Visit: Payer: Self-pay | Admitting: Neurology

## 2023-11-19 DIAGNOSIS — G4733 Obstructive sleep apnea (adult) (pediatric): Secondary | ICD-10-CM | POA: Insufficient documentation
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
## 2023-11-21 NOTE — Telephone Encounter (Signed)
 Contacted pt regarding sleep study, went over MD recommendation. She stated her migraine infusion has improved her migraine frequency. She will hold off at this time.  Advised to call the office back if she decides to move forward with treatment. she verbally understood and was appreciative

## 2023-11-22 ENCOUNTER — Ambulatory Visit (INDEPENDENT_AMBULATORY_CARE_PROVIDER_SITE_OTHER): Admitting: Otolaryngology

## 2023-11-22 ENCOUNTER — Encounter (INDEPENDENT_AMBULATORY_CARE_PROVIDER_SITE_OTHER): Payer: Self-pay | Admitting: Otolaryngology

## 2023-11-22 VITALS — BP 110/69 | HR 95 | Ht 64.0 in | Wt 158.0 lb

## 2023-11-22 DIAGNOSIS — J342 Deviated nasal septum: Secondary | ICD-10-CM

## 2023-11-22 DIAGNOSIS — R490 Dysphonia: Secondary | ICD-10-CM | POA: Diagnosis not present

## 2023-11-22 DIAGNOSIS — J3489 Other specified disorders of nose and nasal sinuses: Secondary | ICD-10-CM

## 2023-11-22 DIAGNOSIS — R053 Chronic cough: Secondary | ICD-10-CM

## 2023-11-22 DIAGNOSIS — R040 Epistaxis: Secondary | ICD-10-CM

## 2023-11-22 DIAGNOSIS — K219 Gastro-esophageal reflux disease without esophagitis: Secondary | ICD-10-CM | POA: Diagnosis not present

## 2023-11-22 DIAGNOSIS — R1314 Dysphagia, pharyngoesophageal phase: Secondary | ICD-10-CM | POA: Diagnosis not present

## 2023-11-22 DIAGNOSIS — J343 Hypertrophy of nasal turbinates: Secondary | ICD-10-CM

## 2023-11-22 DIAGNOSIS — R0981 Nasal congestion: Secondary | ICD-10-CM

## 2023-11-22 DIAGNOSIS — R070 Pain in throat: Secondary | ICD-10-CM | POA: Diagnosis not present

## 2023-11-22 MED ORDER — AYR SALINE NASAL NA GEL: 1.0000 | Freq: Two times a day (BID) | NASAL | 0 refills | Status: AC

## 2023-11-22 MED ORDER — MUPIROCIN 2 % EX OINT: 1.0000 | TOPICAL_OINTMENT | Freq: Two times a day (BID) | CUTANEOUS | 0 refills | Status: AC

## 2023-11-22 NOTE — Patient Instructions (Signed)
 Mupirocin ointment: Apply a pea sized amount twice daily just to inside of each nostril using your pinkie finger for 7 days (apply to the nostril part, not the middle part), then pinch your nose for 10 seconds, then stop After that, use Ayr gel: Apply a pea sized amount up to 4 times daily just to inside of each nostril like above, then pinch your nose for 10 seconds. Use this consistently. Use humidifer in the bedroom     Use your reflux medication twice per day (30 mins before breakfast, before bed). Use reflux gourmet after meals -- lunch and dinner. Avoid coffee or spicy foods I have ordered a swallow test -- they will contact you. If you do not hear back from them I na couple of weeks, let me know.

## 2023-11-22 NOTE — Progress Notes (Signed)
 Dear Dr. Maryanne, Here is my assessment for our mutual patient, Selena Barnes. Thank you for allowing me the opportunity to care for your patient. Please do not hesitate to contact me should you have any other questions. Sincerely, Dr. Eldora Blanch  Otolaryngology Clinic Note  HISTORY:  Initial visit (11/2023): Discussed the use of AI scribe software for clinical note transcription with the patient, who gave verbal consent to proceed.  History of Present Illness Selena Barnes is a 73 year old female who presents with nasal bleeding and chronic cough and dysphagia.   She experiences epistaxis approximately every other day for past several years, primarily on the right side, with an associated soreness in her nose and a sensation of nasal obstruction. Low volume. She has not undergone nasal cauterization and has no frequent sinus infections, anosmia, or need for antibiotics or steroids for sinus issues. Flonase, neti pot, and saline sprays provide temporary relief. No admissions for this or transfusions. Stops by itself. Denies nasal dryness. Currently not using any nasal medications  For the past five to six years, she has also had throat issues, including a chronic cough during choir singing, triggered by a tickle in her throat. No smell or perfume triggers. She sometimes experiences hoarseness in the morning and has PND. Her throat feels dry despite adequate hydration. Generally cough is non-productive. Voice is also more effortful, and primarily bothers her while singing and with voice use. Has never fully lost her voice. She also reports some choking or trouble swallowing with primarily solid foods. Of note, she experiences reflux symptoms, including burning and burping, despite taking PPI once daily.   She does have some right neck pain as well which is chronic and sees chiropractor for  Has made lifestyle changes for her reflux. . She takes baby aspirin  81 mg and denies any family history of  bleeding disorders or anesthesia problems.  Patient otherwise denies: - odynophagia, aspiration episodes or PNA, need for Heimlich, unintentional weight loss - shortness of breath, hemoptysis, otalgia      GLP-1: no AP/AC: ASA 81  Tobacco: no  PMHx: HLD, Migraines, DDD  RADIOGRAPHIC EVALUATION AND INDEPENDENT REVIEW OF OTHER RECORDS:: Dr. Maryanne (09/14/2023): noted right nsaal sores and bleeding - intermittent, ongoing a year; temporary relief with vaseline; on nasal sprays; on GERD medication; Dx: Tonsil stones, folliculitis; Rx: salt water, claritin, ref to ENT Dr. Carlie 2019: recurrent right nasal bleeding; Dx: recommend humidification CBC and BMP 01/23/2023: WBC 6.5, Eos 200, Plt 224; BUN/Cr 10/0.55 CTH 01/23/2023 independently interpreted with respect to sinuses: no noted sinonasal MPT or large masses; cuts thick so suboptimal; right septal deviation, left concha bullosa Past Medical History:  Diagnosis Date   Arthritis    Constipation    Depression    GERD (gastroesophageal reflux disease)    Hyperlipidemia    Insomnia    Migraines    Osteopenia    Past Surgical History:  Procedure Laterality Date   ABDOMINAL HYSTERECTOMY     ANAL RECTAL MANOMETRY N/A 10/06/2015   Procedure: ANO RECTAL MANOMETRY;  Surgeon: Lupita FORBES Commander, MD;  Location: [facility];  Service: Endoscopy;  Laterality: N/A;   BREAST BIOPSY Right    BREAST EXCISIONAL BIOPSY Right    benign   BREAST EXCISIONAL BIOPSY Left 2000   axillary area. lymph node biopsy- benign   CATARACT EXTRACTION W/ INTRAOCULAR LENS IMPLANT Bilateral 07/27/2016   and 09/27/2016, [facility] in [place]   CATARACT EXTRACTION, BILATERAL  CHOLECYSTECTOMY     knee surgery Left 2019   arthroscopic for meniscus tear   LYMPH NODE BIOPSY Left    left arm   Family History  Problem Relation Age of Onset   Heart disease Mother 69       cause of death   Heart disease Father 41       death   Kidney disease  Father    Diabetes Brother    Cancer Brother 76       bone marrow cancer   Breast cancer Maternal Aunt    Breast cancer Maternal Aunt    Breast cancer Maternal Aunt    Diabetes Grandchild        grandson   Migraines Neg Hx    Social History   Tobacco Use   Smoking status: Never    Passive exposure: Never   Smokeless tobacco: Never  Substance Use Topics   Alcohol use: No    Alcohol/week: 0.0 standard drinks of alcohol   Allergies  Allergen Reactions   Sulfa Antibiotics    Sulfacetamide Other (See Comments) and Swelling   Current Outpatient Medications  Medication Sig Dispense Refill   aspirin  EC 81 MG tablet Take 1 tablet (81 mg total) by mouth daily. Swallow whole.     botulinum toxin Type A  (BOTOX ) 200 units injection Inject 155 Units into the muscle every 3 (three) months.     diclofenac  (VOLTAREN ) 75 MG EC tablet Take 1 tablet (75 mg total) by mouth daily as needed. 90 tablet 3   DULoxetine  (CYMBALTA ) 60 MG capsule Take 1 capsule (60 mg total) by mouth 2 (two) times daily. 180 capsule 3   Eptinezumab -jjmr (VYEPTI  IV) Inject 300 mg into the vein every 3 (three) months.     esomeprazole  (NEXIUM ) 40 MG capsule Take 1 capsule (40 mg total) by mouth daily at 12 noon. 90 capsule 3   estradiol  (ESTRACE  VAGINAL) 0.1 MG/GM vaginal cream Place 1 Applicatorful vaginally 3 (three) times a week. 127.5 g 3   fish oil-omega-3 fatty acids 1000 MG capsule Take 2 g by mouth 2 (two) times daily.      Loratadine 10 MG CAPS Take by mouth.     mupirocin ointment (BACTROBAN) 2 % Apply 1 Application topically 2 (two) times daily for 7 days. 22 g 0   promethazine  (PHENERGAN ) 25 MG tablet Take 1 tablet (25 mg total) by mouth every 6 (six) hours as needed for nausea or vomiting. 270 tablet 3   Rimegepant Sulfate (NURTEC) 75 MG TBDP Take 1 tablet (75 mg total) by mouth as needed. 16 tablet 11   rosuvastatin  (CRESTOR ) 20 MG tablet Take 1 tablet (20 mg total) by mouth daily. 90 tablet 3   saline (AYR)  GEL Place 1 Application into both nostrils in the morning and at bedtime for 14 days. 28 g 0   No current facility-administered medications for this visit.   BP 110/69 (BP Location: Right Arm, Patient Position: Sitting, Cuff Size: Large)   Pulse 95   Ht 5' 4 (1.626 m)   Wt 158 lb (71.7 kg)   SpO2 94%   BMI 27.12 kg/m   PHYSICAL EXAM:  BP 110/69 (BP Location: Right Arm, Patient Position: Sitting, Cuff Size: Large)   Pulse 95   Ht 5' 4 (1.626 m)   Wt 158 lb (71.7 kg)   SpO2 94%   BMI 27.12 kg/m    Salient findings:  CN II-XII intact  Bilateral EAC clear and TM  intact with well pneumatized middle ear spaces Nose: Anterior rhinoscopy reveals septum deviates right, prominent caudal right septal vessel, left > right inferior turbinate hypertrophy.  Nasal endoscopy was indicated to better evaluate the nose and paranasal sinuses, given the patient's history and exam findings, and is detailed below. No lesions of oral cavity/oropharynx; palpable tongue base without noted mass No obviously palpable neck masses/lymphadenopathy/thyromegaly No respiratory distress or stridor; voice quality class 2.5, modest gravel quality; TFL was indicated to better evaluate the proximal airway, given the patient's history and exam findings, and is detailed below.  PROCEDURE:  Prior to initiating any procedures, risks/benefits/alternatives were explained to the patient and verbal consent obtained. Diagnostic Nasal Endoscopy Pre-procedure diagnosis: Epistaxis, nasal obstruction, nasal soreness Post-procedure diagnosis: same Indication: See pre-procedure diagnosis and physical exam above Complications: None apparent EBL: 0 mL Anesthesia: Lidocaine  4% and topical decongestant was topically sprayed in each nasal cavity  Description of Procedure:  Patient was identified. A rigid 30 degree endoscope was utilized to evaluate the sinonasal cavities, mucosa, sinus ostia and turbinates and septum.  Overall, signs  of mucosal inflammation are not noted.  Also noted are prominent right caudal septal vessel and right septal deviation.  No mucopurulence, polyps, or masses noted.   Right Middle meatus: clear Right SE Recess: clear Left MM: clear Left SE Recess: clear Photodocumentation was obtained.  CPT CODE -- 68768 - Mod 25  Procedure Note Pre-procedure diagnosis:  Dysphonia, dysphagia, throat discomfort, LPR, GERD Post-procedure diagnosis: Same Procedure: Transnasal Fiberoptic Laryngoscopy, CPT 31575 - Mod 25 Indication: see above Complications: None apparent EBL: 0 mL  The procedure was undertaken to further evaluate the patient's complaint above, with mirror exam inadequate for appropriate examination due to gag reflex and poor patient tolerance  Procedure:  Patient was identified as correct patient. Verbal consent was obtained. The nose was sprayed with oxymetazoline and 4% lidocaine . The The flexible laryngoscope was passed through the nose to view the nasal cavity, pharynx (oropharynx, hypopharynx) and larynx.  The larynx was examined at rest and during multiple phonatory tasks. Documentation was obtained and reviewed with patient. The scope was removed. The patient tolerated the procedure well.  Findings: The nasal cavity and nasopharynx did not reveal any masses or lesions, mucosa appeared to be without obvious lesions. The tongue base, pharyngeal walls, piriform sinuses, vallecula, epiglottis and postcricoid region are normal in appearance. The visualized portion of the subglottis and proximal trachea is widely patent. The vocal folds are mobile bilaterally. There are no lesions on the free edge of the vocal folds nor elsewhere in the larynx worrisome for malignancy.  Age appropriate VF atrophy with likely small glottal gap and hourglass closure; modest AP compression suggestive of MTD  Electronically signed by: Eldora KATHEE Blanch, MD 11/22/2023 12:39 PM    ASSESSMENT:  73 y.o. with:  1.  Throat discomfort   2. Pharyngoesophageal dysphagia   3. Chronic cough   4. Dysphonia   5. Laryngopharyngeal reflux (LPR)   6. Muscle tension dysphonia   7. Nasal congestion   8. Hypertrophy of both inferior nasal turbinates   9. Nasal septal deviation   10. Epistaxis    Multiple complaints including nasal and throat complaint  Right nasal soreness and epistaxis - Recurrent right nasal bleeding likely from a visible septal blood vessel, with dryness contributing. No prior cauterization or anticoagulant use except baby aspirin . - Prescribe mupirocin ointment for right nostril twice daily for one week. - Prescribe air gel post-mupirocin treatment. Use multiple times per  day - Advise bedroom humidifier use. - Plan for cauterization if persistent bleeding after humidification  Dysphagia, LPR, Throat discomfort Likely multifactorial -- LPR, can be due to neck pain as well or MTD (discomfort part). - Increase PPI, twice daily, 30 minutes before breakfast and bed. - Prescribe Reflux Gourmet post-lunch and dinner. - Order swallow test - MBS - Advise against lying down post-meal.  Vocal cord atrophy with hoarseness - Hoarseness from vocal cord atrophy, and MTD. LPR may worsen - Offer voice Rx but declined - Advise adherence to reflux management plan. - Will observe for now given above; increase hydration  F/u in 6 weeks  See below regarding exact medications prescribed this encounter including dosages and route: Meds ordered this encounter  Medications   mupirocin ointment (BACTROBAN) 2 %    Sig: Apply 1 Application topically 2 (two) times daily for 7 days.    Dispense:  22 g    Refill:  0   saline (AYR) GEL    Sig: Place 1 Application into both nostrils in the morning and at bedtime for 14 days.    Dispense:  28 g    Refill:  0     Thank you for allowing me the opportunity to care for your patient. Please do not hesitate to contact me should you have any other  questions.  Sincerely, Eldora Blanch, MD Otolaryngologist (ENT), [facility] Phone: [phone] Fax: [phone]  MDM:  Level 4: [phone] Complexity/Problems addressed: mod - multiple chronic problems Data complexity: mod - independent review of notes, labs, ordering test - Morbidity: mod  - Prescription Drug prescribed or managed: y  11/22/2023, 12:39 PM

## 2023-11-23 ENCOUNTER — Other Ambulatory Visit (HOSPITAL_COMMUNITY): Payer: Self-pay | Admitting: Otolaryngology

## 2023-11-23 DIAGNOSIS — R059 Cough, unspecified: Secondary | ICD-10-CM

## 2023-11-23 DIAGNOSIS — R1310 Dysphagia, unspecified: Secondary | ICD-10-CM

## 2023-12-07 ENCOUNTER — Ambulatory Visit: Admitting: Family Medicine

## 2023-12-07 ENCOUNTER — Encounter: Payer: Self-pay | Admitting: Family Medicine

## 2023-12-07 ENCOUNTER — Ambulatory Visit (INDEPENDENT_AMBULATORY_CARE_PROVIDER_SITE_OTHER)

## 2023-12-07 VITALS — BP 123/66 | HR 84 | Ht 64.0 in | Wt 159.0 lb

## 2023-12-07 DIAGNOSIS — G43919 Migraine, unspecified, intractable, without status migrainosus: Secondary | ICD-10-CM | POA: Diagnosis not present

## 2023-12-07 DIAGNOSIS — E559 Vitamin D deficiency, unspecified: Secondary | ICD-10-CM

## 2023-12-07 DIAGNOSIS — M5030 Other cervical disc degeneration, unspecified cervical region: Secondary | ICD-10-CM

## 2023-12-07 DIAGNOSIS — E785 Hyperlipidemia, unspecified: Secondary | ICD-10-CM | POA: Diagnosis not present

## 2023-12-07 DIAGNOSIS — Z23 Encounter for immunization: Secondary | ICD-10-CM | POA: Diagnosis not present

## 2023-12-07 DIAGNOSIS — Z13820 Encounter for screening for osteoporosis: Secondary | ICD-10-CM

## 2023-12-07 DIAGNOSIS — K219 Gastro-esophageal reflux disease without esophagitis: Secondary | ICD-10-CM | POA: Diagnosis not present

## 2023-12-07 DIAGNOSIS — Z780 Asymptomatic menopausal state: Secondary | ICD-10-CM

## 2023-12-07 LAB — CBC WITH DIFFERENTIAL/PLATELET
Basophils Absolute: 0 x10E3/uL (ref 0.0–0.2)
Basos: 1 %
EOS (ABSOLUTE): 0.3 x10E3/uL (ref 0.0–0.4)
Eos: 5 %
Hematocrit: 39.1 % (ref 34.0–46.6)
Hemoglobin: 12.7 g/dL (ref 11.1–15.9)
Immature Grans (Abs): 0 x10E3/uL (ref 0.0–0.1)
Immature Granulocytes: 0 %
Lymphocytes Absolute: 2 x10E3/uL (ref 0.7–3.1)
Lymphs: 34 %
MCH: 31.1 pg (ref 26.6–33.0)
MCHC: 32.5 g/dL (ref 31.5–35.7)
MCV: 96 fL (ref 79–97)
Monocytes Absolute: 0.6 x10E3/uL (ref 0.1–0.9)
Monocytes: 9 %
Neutrophils Absolute: 3 x10E3/uL (ref 1.4–7.0)
Neutrophils: 51 %
Platelets: 256 x10E3/uL (ref 150–450)
RBC: 4.08 x10E6/uL (ref 3.77–5.28)
RDW: 11.8 % (ref 11.7–15.4)
WBC: 5.8 x10E3/uL (ref 3.4–10.8)

## 2023-12-07 LAB — CMP14+EGFR
ALT: 24 IU/L (ref 0–32)
AST: 26 IU/L (ref 0–40)
Albumin: 4.4 g/dL (ref 3.8–4.8)
Alkaline Phosphatase: 61 IU/L (ref 49–135)
BUN/Creatinine Ratio: 23 (ref 12–28)
BUN: 17 mg/dL (ref 8–27)
Bilirubin Total: 0.4 mg/dL (ref 0.0–1.2)
CO2: 27 mmol/L (ref 20–29)
Calcium: 9.5 mg/dL (ref 8.7–10.3)
Chloride: 103 mmol/L (ref 96–106)
Creatinine, Ser: 0.75 mg/dL (ref 0.57–1.00)
Globulin, Total: 2.9 g/dL (ref 1.5–4.5)
Glucose: 95 mg/dL (ref 70–99)
Potassium: 5.5 mmol/L — ABNORMAL HIGH (ref 3.5–5.2)
Sodium: 142 mmol/L (ref 134–144)
Total Protein: 7.3 g/dL (ref 6.0–8.5)
eGFR: 85 mL/min/1.73 (ref >59)

## 2023-12-07 LAB — LIPID PANEL
Chol/HDL Ratio: 2.8 ratio (ref 0.0–4.4)
Cholesterol, Total: 150 mg/dL (ref 100–199)
HDL: 53 mg/dL (ref >39)
LDL Chol Calc (NIH): 80 mg/dL (ref 0–99)
Triglycerides: 94 mg/dL (ref 0–149)
VLDL Cholesterol Cal: 17 mg/dL (ref 5–40)

## 2023-12-07 MED ORDER — ESTRADIOL 0.01 % VA CREA: 1.0000 | TOPICAL_CREAM | VAGINAL | 12 refills | Status: AC

## 2023-12-07 MED ORDER — DULOXETINE HCL 60 MG PO CPEP: 60.0000 mg | ORAL_CAPSULE | Freq: Two times a day (BID) | ORAL | 3 refills | Status: AC

## 2023-12-07 MED ORDER — ROSUVASTATIN CALCIUM 20 MG PO TABS: 20.0000 mg | ORAL_TABLET | Freq: Every day | ORAL | 3 refills | Status: AC

## 2023-12-07 MED ORDER — ESOMEPRAZOLE MAGNESIUM 40 MG PO CPDR: 40.0000 mg | DELAYED_RELEASE_CAPSULE | Freq: Every day | ORAL | 3 refills | Status: AC

## 2023-12-07 MED ORDER — DICLOFENAC SODIUM 75 MG PO TBEC: 75.0000 mg | DELAYED_RELEASE_TABLET | Freq: Every day | ORAL | 3 refills | Status: AC | PRN

## 2023-12-07 NOTE — Addendum Note (Signed)
 Addended by: BRANDY SETTER D on: 12/07/2023 08:56 AM   Modules accepted: Orders

## 2023-12-07 NOTE — Progress Notes (Signed)
 BP 123/66   Pulse 84   Ht 5' 4 (1.626 m)   Wt 159 lb (72.1 kg)   SpO2 97%   BMI 27.29 kg/m    Subjective:   Patient ID: Selena Barnes, female    DOB: [birth year], 73 y.o.   MRN: [id number]  HPI: Selena Barnes is a 73 y.o. female presenting on 12/07/2023 for Medical Management of Chronic Issues and Hyperlipidemia   Discussed the use of AI scribe software for clinical note transcription with the patient, who gave verbal consent to proceed.  History of Present Illness   Selena Barnes is a 73 year old female who presents for a recheck of her sleep issues.  Insomnia - Chronic difficulty maintaining sleep - Frequent nocturnal awakenings due to coughing, nocturia, and racing thoughts - Melatonin trial without benefit - Currently takes Cymbalta  at night - No recent use of Tylenol  PM; previously tried it a long time ago and is considering retrying  Dysphagia and oropharyngeal sensation - Intermittent episodes of choking on both liquids and solids - Sensation of something stuck on one side of the throat - Scheduled for a swallowing test at [facility]  Headache - Receives Botox  injections for headache management - Significant reduction in headache frequency from six times per week to much less frequent occurrences  Vaginal atrophy - Uses estrogen cream two to three times per week - Good symptomatic response to estrogen cream  Gastroesophageal reflux disease - Takes Nexium  for heartburn - Nexium  is effective in controlling symptoms  Hyperlipidemia - Continues rosuvastatin  (Crestor ) therapy - Discontinued fish oil in June 2025 after loss of [employer] coverage  Immunization status - Received influenza vaccination  Preventive health maintenance - Past due for bone density test          Relevant past medical, surgical, family and social history reviewed and updated as indicated. Interim medical history since our last visit reviewed. Allergies and medications reviewed and  updated.  Review of Systems  Constitutional:  Negative for chills and fever.  HENT:  Negative for congestion, ear discharge and ear pain.   Eyes:  Negative for redness and visual disturbance.  Respiratory:  Negative for chest tightness and shortness of breath.   Cardiovascular:  Negative for chest pain and leg swelling.  Genitourinary:  Negative for difficulty urinating and dysuria.  Musculoskeletal:  Negative for back pain and gait problem.  Skin:  Negative for rash.  Neurological:  Negative for dizziness, light-headedness and headaches.  Psychiatric/Behavioral:  Negative for agitation and behavioral problems.   All other systems reviewed and are negative.   Per HPI unless specifically indicated above   Allergies as of 12/07/2023       Reactions   Sulfa Antibiotics    Sulfacetamide Other (See Comments), Swelling        Medication List        Accurate as of December 07, 2023  8:46 AM. If you have any questions, ask your nurse or doctor.          aspirin  EC 81 MG tablet Take 1 tablet (81 mg total) by mouth daily. Swallow whole.   botulinum toxin Type A  200 units injection Commonly known as: BOTOX  Inject 155 Units into the muscle every 3 (three) months.   diclofenac  75 MG EC tablet Commonly known as: VOLTAREN  Take 1 tablet (75 mg total) by mouth daily as needed.   DULoxetine  60 MG capsule Commonly known as: CYMBALTA  Take 1 capsule (60 mg total) by mouth  2 (two) times daily.   esomeprazole  40 MG capsule Commonly known as: NEXIUM  Take 1 capsule (40 mg total) by mouth daily at 12 noon.   estradiol  0.01 % Crea vaginal cream Commonly known as: ESTRACE  Place 1 Applicatorful vaginally 3 (three) times a week. What changed: medication strength Changed by: Fonda LABOR Odysseus Cada   fish oil-omega-3 fatty acids 1000 MG capsule Take 2 g by mouth 2 (two) times daily.   Loratadine 10 MG Caps Take by mouth.   Nurtec 75 MG Tbdp Generic drug: Rimegepant Sulfate Take 1  tablet (75 mg total) by mouth as needed.   promethazine  25 MG tablet Commonly known as: PHENERGAN  Take 1 tablet (25 mg total) by mouth every 6 (six) hours as needed for nausea or vomiting.   rosuvastatin  20 MG tablet Commonly known as: Crestor  Take 1 tablet (20 mg total) by mouth daily.   saline Gel Place 1 Application into both nostrils in the morning and at bedtime for 14 days.   VYEPTI  IV Inject 300 mg into the vein every 3 (three) months.         Objective:   BP 123/66   Pulse 84   Ht 5' 4 (1.626 m)   Wt 159 lb (72.1 kg)   SpO2 97%   BMI 27.29 kg/m   Wt Readings from Last 3 Encounters:  12/07/23 159 lb (72.1 kg)  11/22/23 158 lb (71.7 kg)  10/03/23 158 lb (71.7 kg)    Physical Exam Vitals and nursing note reviewed.  Constitutional:      Appearance: Normal appearance.  Neck:     Thyroid : No thyroid  mass, thyromegaly or thyroid  tenderness.  Neurological:     Mental Status: She is alert.    Physical Exam   CHEST: Lungs clear to auscultation bilaterally. CARDIOVASCULAR: Regular rate and rhythm, no murmurs.         Assessment & Plan:   Problem List Items Addressed This Visit       Cardiovascular and Mediastinum   Migraines   Relevant Medications   diclofenac  (VOLTAREN ) 75 MG EC tablet   DULoxetine  (CYMBALTA ) 60 MG capsule   rosuvastatin  (CRESTOR ) 20 MG tablet     Digestive   GERD (gastroesophageal reflux disease)   Relevant Medications   esomeprazole  (NEXIUM ) 40 MG capsule   Other Relevant Orders   CBC with Differential/Platelet   CMP14+EGFR   Lipid panel     Musculoskeletal and Integument   Other cervical disc degeneration, unspecified cervical region   Relevant Medications   diclofenac  (VOLTAREN ) 75 MG EC tablet     Other   Hyperlipidemia with target LDL less than 100 - Primary   Relevant Medications   rosuvastatin  (CRESTOR ) 20 MG tablet   Other Relevant Orders   CBC with Differential/Platelet   CMP14+EGFR   Lipid panel       Insomnia Chronic insomnia worsened by coughing, nocturia, and anxiety. Melatonin ineffective. - Recommend trial of Tylenol  PM in the evening. - Consider trazodone if Tylenol  PM is ineffective.  Dysphagia, suspected esophageal stricture Intermittent dysphagia for solids and liquids, likely due to esophageal stricture from chronic reflux. - Perform swallowing study. - Consider esophageal dilation if stricture is confirmed.  Gastroesophageal reflux disease GERD well-controlled with Nexium .  Migraine Chronic migraines managed with BEP infusions and Botox , significantly reducing frequency. Transition of care needed due to provider leaving.  Hyperlipidemia Discontinued fish oil due to insurance and inconclusive cardiovascular benefits. Fish oils target triglycerides. - Order lipid panel to assess triglyceride  levels. - Consider resuming fish oil if triglycerides are elevated.  Vaginal atrophy Vaginal atrophy managed with estrogen cream, providing good relief.  General Health Maintenance Flu vaccination received. Due for bone density screening and shingles vaccination. - Administer bone density screening. - Due for shingles vaccination.       Follow up plan: Return in about 6 months (around 06/06/2024), or if symptoms worsen or fail to improve, for Hyperlipidemia and GERD and physical exam.  Counseling provided for all of the vaccine components Orders Placed This Encounter  Procedures   CBC with Differential/Platelet   CMP14+EGFR   Lipid panel    Fonda Levins, MD [facility] 12/07/2023, 8:46 AM

## 2023-12-10 ENCOUNTER — Ambulatory Visit (HOSPITAL_COMMUNITY)
Admission: RE | Admit: 2023-12-10 | Discharge: 2023-12-10 | Disposition: A | Source: Ambulatory Visit | Attending: Family Medicine

## 2023-12-10 ENCOUNTER — Ambulatory Visit (HOSPITAL_COMMUNITY)
Admission: RE | Admit: 2023-12-10 | Discharge: 2023-12-10 | Disposition: A | Discharge status: Home / Self Care | Source: Ambulatory Visit | Attending: Family Medicine | Admitting: Family Medicine

## 2023-12-10 DIAGNOSIS — R059 Cough, unspecified: Secondary | ICD-10-CM

## 2023-12-10 DIAGNOSIS — R053 Chronic cough: Secondary | ICD-10-CM | POA: Insufficient documentation

## 2023-12-10 DIAGNOSIS — K219 Gastro-esophageal reflux disease without esophagitis: Secondary | ICD-10-CM | POA: Diagnosis not present

## 2023-12-10 DIAGNOSIS — R1314 Dysphagia, pharyngoesophageal phase: Secondary | ICD-10-CM

## 2023-12-10 DIAGNOSIS — R490 Dysphonia: Secondary | ICD-10-CM | POA: Insufficient documentation

## 2023-12-10 DIAGNOSIS — M8580 Other specified disorders of bone density and structure, unspecified site: Secondary | ICD-10-CM | POA: Insufficient documentation

## 2023-12-10 DIAGNOSIS — R0989 Other specified symptoms and signs involving the circulatory and respiratory systems: Secondary | ICD-10-CM | POA: Insufficient documentation

## 2023-12-10 DIAGNOSIS — R040 Epistaxis: Secondary | ICD-10-CM | POA: Insufficient documentation

## 2023-12-10 DIAGNOSIS — R1310 Dysphagia, unspecified: Secondary | ICD-10-CM

## 2023-12-10 NOTE — Progress Notes (Signed)
 Modified Barium Swallow Study  Patient Details  Name: Selena Barnes MRN: [id number] Date of Birth: [birth year]  Today's Date: 12/10/2023  Modified Barium Swallow completed.  Full report located under Chart Review in the Imaging Section.  History of Present Illness Patient is a 73 y.o. female who presents with nasal bleeding and chronic cough and dysphagia. For the past five to six years, patient has had throat issues, including chronic cough during choir singing, triggered by a tickle in her throat. She sometimes experiences hoarseness in the morning. Her throat feels dry despite adequate hydration. Generally her cough is non-productive. Her voice is also more effortful, and primarily bothers her while singing and with voice use. She also reports some choking or trouble swallowing with primarily solid foods. She also experiences reflux symtoms, including burning and burping, despite taking PPI once daily. Modified barium study ordered to rule out aspiration.   Clinical Impression (P) Patient's oral and pharyngeal phases are [facility] per this MBS. Patient did not have aspiration or penetration throughout this study. Barium tablet transitted through the esophagus without delay and no retrograde movement was observed. SLP recommending regular/thin diet. No follow up needed at this time.  DIGEST Swallow Severity Rating*  Safety: 0  Efficiency:0  Overall Pharyngeal Swallow Severity: 0 1: mild; 2: moderate; 3: severe; 4: profound  *The Dynamic Imaging Grade of Swallowing Toxicity is standardized for the head and neck cancer population, however, demonstrates promising clinical applications across populations to standardize the clinical rating of pharyngeal swallow safety and severity.   Swallow Evaluation Recommendations Recommendations: (P) PO diet PO Diet Recommendation: (P) Regular;Thin liquids (Level 0) Liquid Administration via: (P) Spoon;Cup;Straw Medication Administration: (P) Other (Comment)  (As tolerated.) Supervision: (P) Patient able to self-feed Swallowing strategies  : (P) Slow rate;Small bites/sips Postural changes: (P) Position pt fully upright for meals;Stay upright 30-60 min after meals Oral care recommendations: (P) Oral care BID (2x/day)     Damien Hy  Graduate SLP Clinican

## 2023-12-11 DIAGNOSIS — M810 Age-related osteoporosis without current pathological fracture: Secondary | ICD-10-CM | POA: Diagnosis not present

## 2023-12-17 ENCOUNTER — Ambulatory Visit: Payer: Self-pay | Admitting: Family Medicine

## 2023-12-17 DIAGNOSIS — E875 Hyperkalemia: Secondary | ICD-10-CM

## 2023-12-19 ENCOUNTER — Encounter: Admitting: Family Medicine

## 2023-12-19 DIAGNOSIS — G43711 Chronic migraine without aura, intractable, with status migrainosus: Secondary | ICD-10-CM | POA: Diagnosis not present

## 2023-12-24 ENCOUNTER — Telehealth: Payer: Self-pay | Admitting: Adult Health

## 2023-12-24 MED ORDER — ONABOTULINUMTOXINA 200 UNITS IJ SOLR: 155.0000 [IU] | INTRAMUSCULAR | 3 refills | Status: AC

## 2023-12-24 NOTE — Addendum Note (Signed)
 Addended by: JOSHUA IZETTA CROME on: 12/24/2023 03:15 PM   Modules accepted: Orders

## 2023-12-24 NOTE — Telephone Encounter (Signed)
 Submitted auth request to switch pt's [employer] approval to SP, auth was approved. Please send rx to [facility], thank you!  Auth#: [id number] (12/20/23-12/19/24)

## 2023-12-26 ENCOUNTER — Other Ambulatory Visit

## 2023-12-26 DIAGNOSIS — E875 Hyperkalemia: Secondary | ICD-10-CM | POA: Diagnosis not present

## 2023-12-26 LAB — BMP8+EGFR
BUN/Creatinine Ratio: 16 (ref 12–28)
BUN: 12 mg/dL (ref 8–27)
CO2: 26 mmol/L (ref 20–29)
Calcium: 9.8 mg/dL (ref 8.7–10.3)
Chloride: 100 mmol/L (ref 96–106)
Creatinine, Ser: 0.75 mg/dL (ref 0.57–1.00)
Glucose: 102 mg/dL — ABNORMAL HIGH (ref 70–99)
Potassium: 4.4 mmol/L (ref 3.5–5.2)
Sodium: 140 mmol/L (ref 134–144)
eGFR: 85 mL/min/1.73 (ref >59)

## 2023-12-27 ENCOUNTER — Ambulatory Visit: Payer: Self-pay | Admitting: Family Medicine

## 2024-01-03 ENCOUNTER — Ambulatory Visit (INDEPENDENT_AMBULATORY_CARE_PROVIDER_SITE_OTHER): Admitting: Otolaryngology

## 2024-01-03 ENCOUNTER — Encounter (INDEPENDENT_AMBULATORY_CARE_PROVIDER_SITE_OTHER): Payer: Self-pay | Admitting: Otolaryngology

## 2024-01-03 VITALS — BP 114/70 | HR 94 | Ht 64.0 in | Wt 155.0 lb

## 2024-01-03 DIAGNOSIS — R040 Epistaxis: Secondary | ICD-10-CM

## 2024-01-03 DIAGNOSIS — J342 Deviated nasal septum: Secondary | ICD-10-CM | POA: Diagnosis not present

## 2024-01-03 DIAGNOSIS — K219 Gastro-esophageal reflux disease without esophagitis: Secondary | ICD-10-CM | POA: Diagnosis not present

## 2024-01-03 DIAGNOSIS — R490 Dysphonia: Secondary | ICD-10-CM

## 2024-01-03 DIAGNOSIS — R070 Pain in throat: Secondary | ICD-10-CM | POA: Diagnosis not present

## 2024-01-03 DIAGNOSIS — R053 Chronic cough: Secondary | ICD-10-CM | POA: Diagnosis not present

## 2024-01-03 NOTE — Patient Instructions (Signed)
 Mupirocin ointment: Apply a pea sized amount twice daily just to inside of each nostril using your pinkie finger for 7 days (apply to the nostril part, not the middle part), then pinch your nose for 10 seconds, then stop  You can use baby oil or ponaris oil in the nose 2-3 times per day (especially at night)

## 2024-01-03 NOTE — Progress Notes (Signed)
 Dear Dr. Maryanne, Here is my assessment for our mutual patient, Selena Barnes. Thank you for allowing me the opportunity to care for your patient. Please do not hesitate to contact me should you have any other questions. Sincerely, Dr. Eldora Blanch  Otolaryngology Clinic Note  HISTORY:  Initial visit (11/2023): Discussed the use of AI scribe software for clinical note transcription with the patient, who gave verbal consent to proceed.  History of Present Illness Selena Barnes is a 73 year old female who presents with nasal bleeding and chronic cough and dysphagia.   She experiences epistaxis approximately every other day for past several years, primarily on the right side, with an associated soreness in her nose and a sensation of nasal obstruction. Low volume. She has not undergone nasal cauterization and has no frequent sinus infections, anosmia, or need for antibiotics or steroids for sinus issues. Flonase, neti pot, and saline sprays provide temporary relief. No admissions for this or transfusions. Stops by itself. Denies nasal dryness. Currently not using any nasal medications  For the past five to six years, she has also had throat issues, including a chronic cough during choir singing, triggered by a tickle in her throat. No smell or perfume triggers. She sometimes experiences hoarseness in the morning and has PND. Her throat feels dry despite adequate hydration. Generally cough is non-productive. Voice is also more effortful, and primarily bothers her while singing and with voice use. Has never fully lost her voice. She also reports some choking or trouble swallowing with primarily solid foods. Of note, she experiences reflux symptoms, including burning and burping, despite taking PPI once daily.   She does have some right neck pain as well which is chronic and sees chiropractor for  Has made lifestyle changes for her reflux. . She takes baby aspirin  81 mg and denies any family history of  bleeding disorders or anesthesia problems.  Patient otherwise denies: - odynophagia, aspiration episodes or PNA, need for Heimlich, unintentional weight loss - shortness of breath, hemoptysis, otalgia  --------------------------------------------------------- 01/03/2024 Continues to have right sided epistaxis, low volume every other day despite humidification. Did the mupirocin  and ayr gel and still having fair amount of dryness and epistaxis. No frequent sinus issues or other nasal symptoms. She did try Reflux Gourmet and she does really enjoy taking it and feels like it has made a big difference in her reflux symptoms. Swallowing sx persist, but better.     GLP-1: no AP/AC: ASA 81  Tobacco: no  PMHx: HLD, Migraines, DDD  RADIOGRAPHIC EVALUATION AND INDEPENDENT REVIEW OF OTHER RECORDS:: Dr. Maryanne (09/14/2023): noted right nsaal sores and bleeding - intermittent, ongoing a year; temporary relief with vaseline; on nasal sprays; on GERD medication; Dx: Tonsil stones, folliculitis; Rx: salt water, claritin, ref to ENT Dr. Carlie 2019: recurrent right nasal bleeding; Dx: recommend humidification CBC and BMP 01/23/2023: WBC 6.5, Eos 200, Plt 224; BUN/Cr 10/0.55 CTH 01/23/2023 independently interpreted with respect to sinuses: no noted sinonasal MPT or large masses; cuts thick so suboptimal; right septal deviation, left concha bullosa MBS independently interpreted (12/10/2023): No aspiration or penetration, noted cleared for regular diet; modest prominence UES; cannot get great views of esophageal sweep Past Medical History:  Diagnosis Date   Arthritis    Constipation    Depression    GERD (gastroesophageal reflux disease)    Hyperlipidemia    Insomnia    Migraines    Osteopenia    Past Surgical History:  Procedure Laterality Date   ABDOMINAL  HYSTERECTOMY     ANAL RECTAL MANOMETRY N/A 10/06/2015   Procedure: ANO RECTAL MANOMETRY;  Surgeon: Lupita FORBES Commander, MD;  Location: [facility]  ENDOSCOPY;  Service: Endoscopy;  Laterality: N/A;   BREAST BIOPSY Right    BREAST EXCISIONAL BIOPSY Right    benign   BREAST EXCISIONAL BIOPSY Left 2000   axillary area. lymph node biopsy- benign   CATARACT EXTRACTION W/ INTRAOCULAR LENS IMPLANT Bilateral 07/27/2016   and 09/27/2016, [facility] in [place]   CATARACT EXTRACTION, BILATERAL     CHOLECYSTECTOMY     knee surgery Left 2019   arthroscopic for meniscus tear   LYMPH NODE BIOPSY Left    left arm   Family History  Problem Relation Age of Onset   Heart disease Mother 66       cause of death   Heart disease Father 69       death   Kidney disease Father    Diabetes Brother    Cancer Brother 15       bone marrow cancer   Breast cancer Maternal Aunt    Breast cancer Maternal Aunt    Breast cancer Maternal Aunt    Diabetes Grandchild        grandson   Migraines Neg Hx    Social History   Tobacco Use   Smoking status: Never    Passive exposure: Never   Smokeless tobacco: Never  Substance Use Topics   Alcohol use: No    Alcohol/week: 0.0 standard drinks of alcohol   Allergies  Allergen Reactions   Sulfa Antibiotics    Sulfacetamide Other (See Comments) and Swelling   Current Outpatient Medications  Medication Sig Dispense Refill   aspirin  EC 81 MG tablet Take 1 tablet (81 mg total) by mouth daily. Swallow whole.     botulinum toxin Type A  (BOTOX ) 200 units injection Inject 155 Units into the muscle every 3 (three) months. 1 each 3   diclofenac  (VOLTAREN ) 75 MG EC tablet Take 1 tablet (75 mg total) by mouth daily as needed. 90 tablet 3   DULoxetine  (CYMBALTA ) 60 MG capsule Take 1 capsule (60 mg total) by mouth 2 (two) times daily. 180 capsule 3   Eptinezumab -jjmr (VYEPTI  IV) Inject 300 mg into the vein every 3 (three) months.     esomeprazole  (NEXIUM ) 40 MG capsule Take 1 capsule (40 mg total) by mouth daily at 12 noon. 90 capsule 3   estradiol  (ESTRACE ) 0.01 % CREA vaginal cream Place 1  Applicatorful vaginally 3 (three) times a week. 42.5 g 12   fish oil-omega-3 fatty acids 1000 MG capsule Take 2 g by mouth 2 (two) times daily.      Loratadine 10 MG CAPS Take by mouth.     promethazine  (PHENERGAN ) 25 MG tablet Take 1 tablet (25 mg total) by mouth every 6 (six) hours as needed for nausea or vomiting. 270 tablet 3   Rimegepant Sulfate (NURTEC) 75 MG TBDP Take 1 tablet (75 mg total) by mouth as needed. 16 tablet 11   rosuvastatin  (CRESTOR ) 20 MG tablet Take 1 tablet (20 mg total) by mouth daily. 90 tablet 3   saline (AYR) GEL Place 1 Application into both nostrils in the morning and at bedtime for 14 days. 28 g 0   No current facility-administered medications for this visit.   BP 114/70 (BP Location: Left Arm, Patient Position: Sitting, Cuff Size: Large)   Pulse 94   Ht 5' 4 (1.626 m)   Wt 155  lb (70.3 kg)   SpO2 95%   BMI 26.61 kg/m   PHYSICAL EXAM:  BP 114/70 (BP Location: Left Arm, Patient Position: Sitting, Cuff Size: Large)   Pulse 94   Ht 5' 4 (1.626 m)   Wt 155 lb (70.3 kg)   SpO2 95%   BMI 26.61 kg/m    Salient findings:  CN II-XII intact  Bilateral EAC clear and TM intact with well pneumatized middle ear spaces Nose: Anterior rhinoscopy reveals septum deviates right, prominent caudal right septal vessel, left > right inferior turbinate hypertrophy.  Nasal endoscopy was indicated to better evaluate the nose and paranasal sinuses, given the patient's history, persistent symptoms and exam findings, and is detailed below. No lesions of oral cavity/oropharynx; palpable tongue base without noted mass No obviously palpable neck masses/lymphadenopathy/thyromegaly No respiratory distress or stridor; voice quality class 2;  PROCEDURE:  Prior to initiating any procedures, risks/benefits/alternatives were explained to the patient and verbal consent obtained. PROCEDURE: Bilateral Rigid Nasal Endoscopy with endoscopic control of Right sided epistaxis (CPT  [phone]) Pre-procedure diagnosis: Right Epistaxis Post-procedure diagnosis: same Indication: See pre-procedure diagnosis and physical exam above Complications: None apparent EBL: minimal mL Anesthesia: Lidocaine  4% and topical decongestant was topically sprayed in each nasal cavity  Description of Procedure:  Patient was identified as correct patient. Verbal consent obtained. Afrin/lidocaine  mix was sprayed into the nose in both nasal cavities. Subsequently, a headlight and speculum were used and prominent vessels were noted over right septum but I was unable to determine the posterior-most extent of vessels using the headlight/speculum. Therefore, nasal endoscopy was necessary to evaluate and control the epistaxis.  A rigid 30 degree endoscope was utilized to evaluate the sinonasal cavities, mucosa, sinus ostia and turbinates and septum bilaterally. On the left, there were no prominent vessels; MM and SE recess clear. On the contralateral side, there were prominent septal vessels on septum but was no bleeding. We discussed options including continued humidification, v/s cautery and R/B/A and patient/caregiver opted for cauterization. Given lack of complete visualization of the posterior portion of the vessel extent, decision was performed to perform cauterization after consent. Using the endoscope, the areas of bleeding were visualized and then spot cauterized using silver nitrate cautery.  No bleeding was seen. Mupirocin ointment was applied to both nares. Patient tolerated the procedure well.  ASSESSMENT:  73 y.o. with:  1. Throat discomfort   2. Chronic cough   3. Dysphonia   4. Laryngopharyngeal reflux (LPR)   5. Muscle tension dysphonia   6. Nasal septal deviation   7. Epistaxis    Multiple complaints including nasal and throat complaint  Right nasal soreness and epistaxis - Recurrent right nasal bleeding likely from a visible septal blood vessel, with dryness contributing. No prior  cauterization or anticoagulant use except baby aspirin . - Persistent sx despite medical management but soreness much improved after gels - As such, discussed cautery which patient wished to proceed with and was performed - Recommend mupirocin ointment for right nostril twice daily for one week. - Ayr gel or ponaris gel post-mupirocin treatment. Use multiple times per day - Advise bedroom humidifier use. - she will call if sx persist; keep epistaxis diary  Dysphagia, LPR, Throat discomfort Likely multifactorial -- LPR, can be due to neck pain as well or MTD (discomfort part). - MBS reassuring - Reflux gourmet helping with discomfort and LPR - We discussed options including referral to GI as OP swallow looks good but she reports she wishes to observe; return precautions  discussed  Vocal cord atrophy with hoarseness - Hoarseness from vocal cord atrophy, and MTD - Again offer voice Rx but declined - Will observe for now given above; increase hydration  See below regarding exact medications prescribed this encounter including dosages and route: see above - mupirocin BID x7d    Thank you for allowing me the opportunity to care for your patient. Please do not hesitate to contact me should you have any other questions.  Sincerely, Eldora Blanch, MD Otolaryngologist (ENT), [name] Phone: [phone] Fax: [phone]  MDM:  Level 4: 99214 Complexity/Problems addressed: mod - multiple chronic problems Data complexity: low - Morbidity: mod  - Prescription Drug prescribed or managed: y  01/03/2024, 11:05 AM

## 2024-01-17 NOTE — Telephone Encounter (Signed)
 I have been in contact with [employer] for the last week, they are still having issues with running pt's insurance to send Botox . I switched her auth back to B/B for this appointment so we don't have to reschedule her.  Auth#: [id number] (01/17/24-01/16/25)

## 2024-01-18 ENCOUNTER — Ambulatory Visit: Admitting: Adult Health

## 2024-01-21 ENCOUNTER — Ambulatory Visit: Admitting: Adult Health

## 2024-01-21 ENCOUNTER — Telehealth: Payer: Self-pay | Admitting: Adult Health

## 2024-01-21 NOTE — Telephone Encounter (Signed)
 Pt called stating that this morning she had a power outage  and could not make call to reschedule appt ,   Appt Rescheduled

## 2024-01-21 NOTE — Progress Notes (Deleted)
 Botox - 200 units x 1 vial Lot: [id number] Expiration: 04/2026 NDC: 9976-6078-97   Bacteriostatic 0.9% Sodium Chloride - 4 mL  Lot: [id number] Expiration: 0630/2026 NDC: 9590-8033-97   Dx: g43.711  B/B Witnessed by Rojean DEL

## 2024-01-22 ENCOUNTER — Encounter: Payer: Self-pay | Admitting: Adult Health

## 2024-01-22 ENCOUNTER — Ambulatory Visit: Admitting: Adult Health

## 2024-01-22 VITALS — BP 118/80 | HR 95 | Ht 64.0 in

## 2024-01-22 DIAGNOSIS — G43711 Chronic migraine without aura, intractable, with status migrainosus: Secondary | ICD-10-CM | POA: Diagnosis not present

## 2024-01-22 MED ORDER — ONABOTULINUMTOXINA 200 UNITS IJ SOLR
155.0000 [IU] | Freq: Once | INTRAMUSCULAR | Status: AC
  Administered 2024-01-22: 155 [IU] via INTRAMUSCULAR

## 2024-01-22 NOTE — Progress Notes (Signed)
 Botox - 200 units x 1 vial Lot: [id number] Expiration: 3/28 NDC: 9976-6078-97  Bacteriostatic 0.9% Sodium Chloride - 4 mL  Lot: [id number] Expiration: 12/13/24 NDC: 9590-8033-97  Dx: H56.288 B/B Witnessed by Stephania, CMA

## 2024-01-22 NOTE — Progress Notes (Signed)
 Update 01/22/2024 JM: Patient is being seen for repeat Botox  injection, prior injection 10/17/2023.  She also continues to receive Vyepti  300 mg infusions. Migraines remain well controlled overall on both therapies with greater than 50% reduction in migraine frequency and severity.  Use of Nurtec with benefit. Tolerated procedure well today.  She will return in 3 months for repeat Botox  injection.     Consent Form Botulism Toxin Injection For Chronic Migraine    Reviewed orally with patient, additionally signature is on file:  Botulism toxin has been approved by the Federal drug administration for treatment of chronic migraine. Botulism toxin does not cure chronic migraine and it may not be effective in some patients.  The administration of botulism toxin is accomplished by injecting a small amount of toxin into the muscles of the neck and head. Dosage must be titrated for each individual. Any benefits resulting from botulism toxin tend to wear off after 3 months with a repeat injection required if benefit is to be maintained. Injections are usually done every 3-4 months with maximum effect peak achieved by about 2 or 3 weeks. Botulism toxin is expensive and you should be sure of what costs you will incur resulting from the injection.  The side effects of botulism toxin use for chronic migraine may include:   -Transient, and usually mild, facial weakness with facial injections  -Transient, and usually mild, head or neck weakness with head/neck injections  -Reduction or loss of forehead facial animation due to forehead muscle weakness  -Eyelid drooping  -Dry eye  -Pain at the site of injection or bruising at the site of injection  -Double vision  -Potential unknown long term risks   Contraindications: You should not have Botox  if you are pregnant, nursing, allergic to albumin, have an infection, skin condition, or muscle weakness at the site of the injection, or have myasthenia  gravis, Lambert-Eaton syndrome, or ALS.  It is also possible that as with any injection, there may be an allergic reaction or no effect from the medication. Reduced effectiveness after repeated injections is sometimes seen and rarely infection at the injection site may occur. All care will be taken to prevent these side effects. If therapy is given over a long time, atrophy and wasting in the muscle injected may occur. Occasionally the patient's become refractory to treatment because they develop antibodies to the toxin. In this event, therapy needs to be modified.  I have read the above information and consent to the administration of botulism toxin.    BOTOX  PROCEDURE NOTE FOR MIGRAINE HEADACHE  Contraindications and precautions discussed with patient(above). Aseptic procedure was observed and patient tolerated procedure. Procedure performed by Harlene Bogaert, AGNP-BC.   The condition has existed for more than 6 months, and pt does not have a diagnosis of ALS, Myasthenia Gravis or Lambert-Eaton Syndrome.  Risks and benefits of injections discussed and pt agrees to proceed with the procedure.  Written consent obtained  These injections are medically necessary. Pt  receives good benefits from these injections. These injections do not cause sedations or hallucinations which the oral therapies may cause.   Description of procedure:  The patient was placed in a sitting position. The standard protocol was used for Botox  as follows, with 5 units of Botox  injected at each site:  -Procerus muscle, midline injection  -Corrugator muscle, bilateral injection  -Frontalis muscle, bilateral injection, with 2 sites each side, medial injection was performed in the upper one third of the frontalis muscle,  in the region vertical from the medial inferior edge of the superior orbital rim. The lateral injection was again in the upper one third of the forehead vertically above the lateral limbus of the cornea,  1.5 cm lateral to the medial injection site.  -Temporalis muscle injection, 4 sites, bilaterally. The first injection was 3 cm above the tragus of the ear, second injection site was 1.5 cm to 3 cm up from the first injection site in line with the tragus of the ear. The third injection site was 1.5-3 cm forward between the first 2 injection sites. The fourth injection site was 1.5 cm posterior to the second injection site. 5th site laterally in the temporalis  muscleat the level of the outer canthus.  -Occipitalis muscle injection, 3 sites, bilaterally. The first injection was done one half way between the occipital protuberance and the tip of the mastoid process behind the ear. The second injection site was done lateral and superior to the first, 1 fingerbreadth from the first injection. The third injection site was 1 fingerbreadth superiorly and medially from the first injection site.  -Cervical paraspinal muscle injection, 2 sites, bilaterally. The first injection site was 1 cm from the midline of the cervical spine, 3 cm inferior to the lower border of the occipital protuberance. The second injection site was 1.5 cm superiorly and laterally to the first injection site.  -Trapezius muscle injection was performed at 3 sites, bilaterally. The first injection site was in the upper trapezius muscle halfway between the inflection point of the neck, and the acromion. The second injection site was one half way between the acromion and the first injection site. The third injection was done between the first injection site and the inflection point of the neck.    A total of 200 units of Botox  was prepared, 155 units of Botox  was injected as documented above, any Botox  not injected was wasted. The patient tolerated the procedure well, there were no complications of the above procedure.   Harlene Bogaert, AGNP-BC  [facility] [address] [place], [place] [address]  Phone  [phone] Fax [phone] Note: This document was prepared with digital dictation and possible smart phrase technology. Any transcriptional errors that result from this process are unintentional.

## 2024-01-25 ENCOUNTER — Ambulatory Visit

## 2024-01-25 VITALS — BP 114/70 | HR 94 | Ht 64.0 in | Wt 155.0 lb

## 2024-01-25 DIAGNOSIS — Z0000 Encounter for general adult medical examination without abnormal findings: Secondary | ICD-10-CM

## 2024-01-25 NOTE — Progress Notes (Signed)
 Chief Complaint  Patient presents with   Medicare Wellness     Subjective:   Selena Barnes is a 73 y.o. female who presents for a Medicare Annual Wellness Visit.  Visit info / Clinical Intake: Medicare Wellness Visit Type:: Subsequent Annual Wellness Visit Persons participating in visit and providing information:: patient Medicare Wellness Visit Mode:: Telephone If telephone:: video declined Since this visit was completed virtually, some vitals may be partially provided or unavailable. Missing vitals are due to the limitations of the virtual format.: Documented vitals are patient reported If Telephone or Video please confirm:: I connected with patient using audio/video enable telemedicine. I verified patient identity with two identifiers, discussed telehealth limitations, and patient agreed to proceed. Patient Location:: home Provider Location:: home office Interpreter Needed?: No Pre-visit prep was completed: yes AWV questionnaire completed by patient prior to visit?: yes Date:: 01/24/24 Living arrangements:: (Patient-Rptd) lives with spouse/significant other Patient's Overall Health Status Rating: (Patient-Rptd) very good Typical amount of pain: (Patient-Rptd) some Does pain affect daily life?: (Patient-Rptd) no Are you currently prescribed opioids?: no  Dietary Habits and Nutritional Risks How many meals a day?: (Patient-Rptd) 2 Eats fruit and vegetables daily?: (!) (Patient-Rptd) no Most meals are obtained by: (Patient-Rptd) preparing own meals In the last 2 weeks, have you had any of the following?: none Diabetic:: no  Functional Status Activities of Daily Living (to include ambulation/medication): (Patient-Rptd) Independent Ambulation: (Patient-Rptd) Independent Medication Administration: (Patient-Rptd) Independent Home Management (perform basic housework or laundry): (Patient-Rptd) Independent Manage your own finances?: (Patient-Rptd) yes Primary transportation is:  (Patient-Rptd) driving Concerns about vision?: no *vision screening is required for WTM* (last 3-05/2022/Dr. Jama, [facility] in [facility]) Concerns about hearing?: no  Fall Screening Falls in the past year?: (Patient-Rptd) 0 Number of falls in past year: 0 Was there an injury with Fall?: 0 Fall Risk Category Calculator: 0 Patient Fall Risk Level: Low Fall Risk  Fall Risk Patient at Risk for Falls Due to: No Fall Risks Fall risk Follow up: Falls evaluation completed; Education provided  Home and Transportation Safety: All rugs have non-skid backing?: (Patient-Rptd) yes All stairs or steps have railings?: (Patient-Rptd) N/A, no stairs Grab bars in the bathtub or shower?: (Patient-Rptd) yes Have non-skid surface in bathtub or shower?: (Patient-Rptd) yes Good home lighting?: (Patient-Rptd) yes Regular seat belt use?: (Patient-Rptd) yes Hospital stays in the last year:: (Patient-Rptd) no  Cognitive Assessment Difficulty concentrating, remembering, or making decisions? : (Patient-Rptd) no Will 6CIT or Mini Cog be Completed: yes What year is it?: 0 points What month is it?: 0 points Give patient an address phrase to remember (5 components): [address] [name] About what time is it?: 0 points Count backwards from 20 to 1: 0 points Say the months of the year in reverse: 0 points Repeat the address phrase from earlier: 0 points 6 CIT Score: 0 points  Advance Directives (For Healthcare) Does Patient Have a Medical Advance Directive?: No Would patient like information on creating a medical advance directive?: No - Patient declined  Reviewed/Updated  Reviewed/Updated: Reviewed All (Medical, Surgical, Family, Medications, Allergies, Care Teams, Patient Goals); Medical History; Surgical History; Family History; Medications; Allergies; Care Teams; Patient Goals    Allergies (verified) Sulfa antibiotics and Sulfacetamide   Current Medications (verified) Outpatient Encounter  Medications as of 01/25/2024  Medication Sig   aspirin  EC 81 MG tablet Take 1 tablet (81 mg total) by mouth daily. Swallow whole.   botulinum toxin Type A  (BOTOX ) 200 units injection Inject 155  Units into the muscle every 3 (three) months.   diclofenac  (VOLTAREN ) 75 MG EC tablet Take 1 tablet (75 mg total) by mouth daily as needed.   DULoxetine  (CYMBALTA ) 60 MG capsule Take 1 capsule (60 mg total) by mouth 2 (two) times daily.   Eptinezumab -jjmr (VYEPTI  IV) Inject 300 mg into the vein every 3 (three) months.   esomeprazole  (NEXIUM ) 40 MG capsule Take 1 capsule (40 mg total) by mouth daily at 12 noon.   estradiol  (ESTRACE ) 0.01 % CREA vaginal cream Place 1 Applicatorful vaginally 3 (three) times a week.   fish oil-omega-3 fatty acids 1000 MG capsule Take 2 g by mouth 2 (two) times daily.    Loratadine 10 MG CAPS Take by mouth.   promethazine  (PHENERGAN ) 25 MG tablet Take 1 tablet (25 mg total) by mouth every 6 (six) hours as needed for nausea or vomiting.   Rimegepant Sulfate (NURTEC) 75 MG TBDP Take 1 tablet (75 mg total) by mouth as needed.   rosuvastatin  (CRESTOR ) 20 MG tablet Take 1 tablet (20 mg total) by mouth daily.   saline (AYR) GEL Place 1 Application into both nostrils in the morning and at bedtime for 14 days.   No facility-administered encounter medications on file as of 01/25/2024.    History: Past Medical History:  Diagnosis Date   Allergy    Anxiety    Arthritis    Blood transfusion without reported diagnosis    Constipation    Depression    GERD (gastroesophageal reflux disease)    Hyperlipidemia    Insomnia    Migraines    Osteopenia    Past Surgical History:  Procedure Laterality Date   ABDOMINAL HYSTERECTOMY     ANAL RECTAL MANOMETRY N/A 10/06/2015   Procedure: ANO RECTAL MANOMETRY;  Surgeon: Lupita FORBES Commander, MD;  Location: [facility];  Service: Endoscopy;  Laterality: N/A;   APPENDECTOMY     BREAST BIOPSY Right    BREAST EXCISIONAL BIOPSY Right    benign    BREAST EXCISIONAL BIOPSY Left 2000   axillary area. lymph node biopsy- benign   CATARACT EXTRACTION W/ INTRAOCULAR LENS IMPLANT Bilateral 07/27/2016   and 09/27/2016, [facility] in [place]   CATARACT EXTRACTION, BILATERAL     CHOLECYSTECTOMY     knee surgery Left 2019   arthroscopic for meniscus tear   LYMPH NODE BIOPSY Left    left arm   Family History  Problem Relation Age of Onset   Heart disease Mother 67       cause of death   Heart disease Father 59       death   Kidney disease Father    Arthritis Father    Diabetes Brother    Cancer Brother 39       bone marrow cancer   Breast cancer Maternal Aunt    Breast cancer Maternal Aunt    Breast cancer Maternal Aunt    Diabetes Grandchild        grandson   Migraines Neg Hx    Social History   Occupational History   Occupation: retired  Tobacco Use   Smoking status: Never    Passive exposure: Never   Smokeless tobacco: Never  Vaping Use   Vaping status: Never Used  Substance and Sexual Activity   Alcohol use: Never   Drug use: Never   Sexual activity: Not Currently    Partners: Male    Birth control/protection: None   Tobacco Counseling Counseling given: Yes  SDOH  Screenings   Food Insecurity: No Food Insecurity (01/25/2024)  Housing: Low Risk (01/25/2024)  Transportation Needs: No Transportation Needs (01/25/2024)  Utilities: Not At Risk (01/25/2024)  Alcohol Screen: Low Risk (11/29/2023)  Depression (PHQ2-9): Low Risk (01/25/2024)  Financial Resource Strain: Low Risk (11/29/2023)  Physical Activity: Sufficiently Active (01/25/2024)  Social Connections: Socially Integrated (01/25/2024)  Stress: No Stress Concern Present (01/25/2024)  Tobacco Use: Low Risk (01/25/2024)  Health Literacy: Adequate Health Literacy (01/25/2024)   See flowsheets for full screening details  Depression Screen PHQ 2 & 9 Depression Scale- Over the past 2 weeks, how often have you been bothered by any of the  following problems? Little interest or pleasure in doing things: 0 Feeling down, depressed, or hopeless (PHQ Adolescent also includes...irritable): 0 PHQ-2 Total Score: 0 Trouble falling or staying asleep, or sleeping too much: 0 Feeling tired or having little energy: 0 Poor appetite or overeating (PHQ Adolescent also includes...weight loss): 0 Feeling bad about yourself - or that you are a failure or have let yourself or your family down: 0 Trouble concentrating on things, such as reading the newspaper or watching television (PHQ Adolescent also includes...like school work): 0 Moving or speaking so slowly that other people could have noticed. Or the opposite - being so fidgety or restless that you have been moving around a lot more than usual: 0 Thoughts that you would be better off dead, or of hurting yourself in some way: 0 PHQ-9 Total Score: 0 If you checked off any problems, how difficult have these problems made it for you to do your work, take care of things at home, or get along with other people?: Not difficult at all  Depression Treatment Depression Interventions/Treatment : EYV7-0 Score <4 Follow-up Not Indicated     Goals Addressed             This Visit's Progress    Prevent Falls and Injury               Objective:    Today's Vitals   01/25/24 1209  BP: 114/70  Pulse: 94  Weight: 155 lb (70.3 kg)  Height: 5' 4 (1.626 m)   Body mass index is 26.61 kg/m.  Hearing/Vision screen No results found. Immunizations and Health Maintenance Health Maintenance  Topic Date Due   COVID-19 Vaccine (4 - 2025-26 season) 10/15/2023   Zoster Vaccines- Shingrix  (2 of 2) 03/08/2024 (Originally 08/05/2021)   Medicare Annual Wellness (AWV)  01/24/2025   Colonoscopy  07/26/2025   Mammogram  09/18/2025   DTaP/Tdap/Td (2 - Td or Tdap) 11/09/2025   Bone Density Scan  12/10/2025   Pneumococcal Vaccine: 50+ Years  Completed   Influenza Vaccine  Completed   Hepatitis C  Screening  Completed   Meningococcal B Vaccine  Aged Out        Assessment/Plan:  This is a routine wellness examination for [place].  Patient Care Team: Dettinger, Fonda LABOR, MD as PCP - General (Family Medicine) Avram Lupita BRAVO, MD as Consulting Physician (Gastroenterology) Billee Mliss BIRCH, RPH-CPP as Pharmacist (Family Medicine)  I have personally reviewed and noted the following in the patients chart:   Medical and social history Use of alcohol, tobacco or illicit drugs  Current medications and supplements including opioid prescriptions. Functional ability and status Nutritional status Physical activity Advanced directives List of other physicians Hospitalizations, surgeries, and ER visits in previous 12 months Vitals Screenings to include cognitive, depression, and falls Referrals and appointments  No orders of the defined types  were placed in this encounter.  In addition, I have reviewed and discussed with patient certain preventive protocols, quality metrics, and best practice recommendations. A written personalized care plan for preventive services as well as general preventive health recommendations were provided to patient.   Ozie Ned, CMA   01/25/2024   Return in 1 year (on 01/24/2025).  After Visit Summary: (MyChart) Due to this being a telephonic visit, the after visit summary with patients personalized plan was offered to patient via MyChart   Nurse Notes: pt declined covid vaccine; next awv 01/27/25 at 11:20am

## 2024-01-25 NOTE — Patient Instructions (Signed)
 Selena Barnes,  Thank you for taking the time for your Medicare Wellness Visit. I appreciate your continued commitment to your health goals. Please review the care plan we discussed, and feel free to reach out if I can assist you further.  Please note that Annual Wellness Visits do not include a physical exam. Some assessments may be limited, especially if the visit was conducted virtually. If needed, we may recommend an in-person follow-up with your provider.  Ongoing Care Seeing your primary care provider every 3 to 6 months helps us  monitor your health and provide consistent, personalized care.   Referrals If a referral was made during today's visit and you haven't received any updates within two weeks, please contact the referred provider directly to check on the status.  Recommended Screenings:  Health Maintenance  Topic Date Due   COVID-19 Vaccine (4 - 2025-26 season) 10/15/2023   Medicare Annual Wellness Visit  12/01/2023   Zoster (Shingles) Vaccine (2 of 2) 03/08/2024*   Colon Cancer Screening  07/26/2025   Breast Cancer Screening  09/18/2025   DTaP/Tdap/Td vaccine (2 - Td or Tdap) 11/09/2025   Osteoporosis screening with Bone Density Scan  12/10/2025   Pneumococcal Vaccine for age over 33  Completed   Flu Shot  Completed   Hepatitis C Screening  Completed   Meningitis B Vaccine  Aged Out  *Topic was postponed. The date shown is not the original due date.       01/24/2024    7:46 PM  Advanced Directives  Does Patient Have a Medical Advance Directive? No  Would patient like information on creating a medical advance directive? No - Patient declined    Vision: Annual vision screenings are recommended for early detection of glaucoma, cataracts, and diabetic retinopathy. These exams can also reveal signs of chronic conditions such as diabetes and high blood pressure.  Dental: Annual dental screenings help detect early signs of oral cancer, gum disease, and other conditions  linked to overall health, including heart disease and diabetes.  Please see the attached documents for additional preventive care recommendations.

## 2024-01-29 ENCOUNTER — Ambulatory Visit (INDEPENDENT_AMBULATORY_CARE_PROVIDER_SITE_OTHER): Admitting: Otolaryngology

## 2024-01-29 ENCOUNTER — Encounter (INDEPENDENT_AMBULATORY_CARE_PROVIDER_SITE_OTHER): Payer: Self-pay | Admitting: Otolaryngology

## 2024-01-29 VITALS — BP 121/74 | HR 103 | Ht 64.0 in | Wt 153.0 lb

## 2024-01-29 DIAGNOSIS — J342 Deviated nasal septum: Secondary | ICD-10-CM

## 2024-01-29 DIAGNOSIS — J383 Other diseases of vocal cords: Secondary | ICD-10-CM | POA: Diagnosis not present

## 2024-01-29 DIAGNOSIS — R040 Epistaxis: Secondary | ICD-10-CM

## 2024-01-29 DIAGNOSIS — R0981 Nasal congestion: Secondary | ICD-10-CM

## 2024-01-29 DIAGNOSIS — R490 Dysphonia: Secondary | ICD-10-CM | POA: Diagnosis not present

## 2024-01-29 DIAGNOSIS — K219 Gastro-esophageal reflux disease without esophagitis: Secondary | ICD-10-CM

## 2024-01-29 DIAGNOSIS — R070 Pain in throat: Secondary | ICD-10-CM

## 2024-01-29 NOTE — Progress Notes (Signed)
 Dear Dr. Maryanne, Here is my assessment for our mutual patient, Selena Barnes. Thank you for allowing me the opportunity to care for your patient. Please do not hesitate to contact me should you have any other questions. Sincerely, Dr. Eldora Blanch  Otolaryngology Clinic Note  HISTORY:  Initial visit (11/2023): Discussed the use of AI scribe software for clinical note transcription with the patient, who gave verbal consent to proceed.  History of Present Illness Selena Barnes is a 73 year old female who presents with nasal bleeding and chronic cough and dysphagia.   She experiences epistaxis approximately every other day for past several years, primarily on the right side, with an associated soreness in her nose and a sensation of nasal obstruction. Low volume. She has not undergone nasal cauterization and has no frequent sinus infections, anosmia, or need for antibiotics or steroids for sinus issues. Flonase, neti pot, and saline sprays provide temporary relief. No admissions for this or transfusions. Stops by itself. Denies nasal dryness. Currently not using any nasal medications  For the past five to six years, she has also had throat issues, including a chronic cough during choir singing, triggered by a tickle in her throat. No smell or perfume triggers. She sometimes experiences hoarseness in the morning and has PND. Her throat feels dry despite adequate hydration. Generally cough is non-productive. Voice is also more effortful, and primarily bothers her while singing and with voice use. Has never fully lost her voice. She also reports some choking or trouble swallowing with primarily solid foods. Of note, she experiences reflux symptoms, including burning and burping, despite taking PPI once daily.   She does have some right neck pain as well which is chronic and sees chiropractor for  Has made lifestyle changes for her reflux. . She takes baby aspirin  81 mg and denies any family history of  bleeding disorders or anesthesia problems.  Patient otherwise denies: - odynophagia, aspiration episodes or PNA, need for Heimlich, unintentional weight loss - shortness of breath, hemoptysis, otalgia  --------------------------------------------------------- 01/03/2024 Continues to have right sided epistaxis, low volume every other day despite humidification. Did the mupirocin  and ayr gel and still having fair amount of dryness and epistaxis. No frequent sinus issues or other nasal symptoms. She did try Reflux Gourmet and she does really enjoy taking it and feels like it has made a big difference in her reflux symptoms. Swallowing sx persist, but better.   --------------------------------------------------------- 01/29/2024 Still having some issues with epistaxis on right, primarily when nose feels dry. Swallowing stable.     GLP-1: no AP/AC: ASA 81  Tobacco: no  PMHx: HLD, Migraines, DDD  RADIOGRAPHIC EVALUATION AND INDEPENDENT REVIEW OF OTHER RECORDS:: Dr. Maryanne (09/14/2023): noted right nsaal sores and bleeding - intermittent, ongoing a year; temporary relief with vaseline; on nasal sprays; on GERD medication; Dx: Tonsil stones, folliculitis; Rx: salt water, claritin, ref to ENT Dr. Carlie 2019: recurrent right nasal bleeding; Dx: recommend humidification CBC and BMP 01/23/2023: WBC 6.5, Eos 200, Plt 224; BUN/Cr 10/0.55 CTH 01/23/2023 independently interpreted with respect to sinuses: no noted sinonasal MPT or large masses; cuts thick so suboptimal; right septal deviation, left concha bullosa MBS independently interpreted (12/10/2023): No aspiration or penetration, noted cleared for regular diet; modest prominence UES; cannot get great views of esophageal sweep Past Medical History:  Diagnosis Date   Allergy    Anxiety    Arthritis    Blood transfusion without reported diagnosis    Constipation    Depression  GERD (gastroesophageal reflux disease)    Hyperlipidemia     Insomnia    Migraines    Osteopenia    Past Surgical History:  Procedure Laterality Date   ABDOMINAL HYSTERECTOMY     ANAL RECTAL MANOMETRY N/A 10/06/2015   Procedure: ANO RECTAL MANOMETRY;  Surgeon: Lupita FORBES Commander, MD;  Location: [facility];  Service: Endoscopy;  Laterality: N/A;   APPENDECTOMY     BREAST BIOPSY Right    BREAST EXCISIONAL BIOPSY Right    benign   BREAST EXCISIONAL BIOPSY Left 2000   axillary area. lymph node biopsy- benign   CATARACT EXTRACTION W/ INTRAOCULAR LENS IMPLANT Bilateral 07/27/2016   and 09/27/2016, [facility] in [place]   CATARACT EXTRACTION, BILATERAL     CHOLECYSTECTOMY     knee surgery Left 2019   arthroscopic for meniscus tear   LYMPH NODE BIOPSY Left    left arm   Family History  Problem Relation Age of Onset   Heart disease Mother 55       cause of death   Heart disease Father 27       death   Kidney disease Father    Arthritis Father    Diabetes Brother    Cancer Brother 51       bone marrow cancer   Breast cancer Maternal Aunt    Breast cancer Maternal Aunt    Breast cancer Maternal Aunt    Diabetes Grandchild        grandson   Migraines Neg Hx    Social History   Tobacco Use   Smoking status: Never    Passive exposure: Never   Smokeless tobacco: Never  Substance Use Topics   Alcohol use: Never   Allergies  Allergen Reactions   Sulfa Antibiotics    Sulfacetamide Other (See Comments) and Swelling   Current Outpatient Medications  Medication Sig Dispense Refill   aspirin  EC 81 MG tablet Take 1 tablet (81 mg total) by mouth daily. Swallow whole.     botulinum toxin Type A  (BOTOX ) 200 units injection Inject 155 Units into the muscle every 3 (three) months. 1 each 3   diclofenac  (VOLTAREN ) 75 MG EC tablet Take 1 tablet (75 mg total) by mouth daily as needed. 90 tablet 3   DULoxetine  (CYMBALTA ) 60 MG capsule Take 1 capsule (60 mg total) by mouth 2 (two) times daily. 180 capsule 3   Eptinezumab -jjmr (VYEPTI   IV) Inject 300 mg into the vein every 3 (three) months.     esomeprazole  (NEXIUM ) 40 MG capsule Take 1 capsule (40 mg total) by mouth daily at 12 noon. 90 capsule 3   estradiol  (ESTRACE ) 0.01 % CREA vaginal cream Place 1 Applicatorful vaginally 3 (three) times a week. 42.5 g 12   fish oil-omega-3 fatty acids 1000 MG capsule Take 2 g by mouth 2 (two) times daily.      Loratadine 10 MG CAPS Take by mouth.     promethazine  (PHENERGAN ) 25 MG tablet Take 1 tablet (25 mg total) by mouth every 6 (six) hours as needed for nausea or vomiting. 270 tablet 3   Rimegepant Sulfate (NURTEC) 75 MG TBDP Take 1 tablet (75 mg total) by mouth as needed. 16 tablet 11   rosuvastatin  (CRESTOR ) 20 MG tablet Take 1 tablet (20 mg total) by mouth daily. 90 tablet 3   saline (AYR) GEL Place 1 Application into both nostrils in the morning and at bedtime for 14 days. 28 g 0   No current  facility-administered medications for this visit.   BP 121/74 (BP Location: Left Arm, Patient Position: Sitting, Cuff Size: Normal)   Pulse (!) 103   Ht 5' 4 (1.626 m)   Wt 153 lb (69.4 kg)   SpO2 93%   BMI 26.26 kg/m   PHYSICAL EXAM:  BP 121/74 (BP Location: Left Arm, Patient Position: Sitting, Cuff Size: Normal)   Pulse (!) 103   Ht 5' 4 (1.626 m)   Wt 153 lb (69.4 kg)   SpO2 93%   BMI 26.26 kg/m    Salient findings:  CN II-XII intact  Bilateral EAC clear and TM intact with well pneumatized middle ear spaces Nose: Anterior rhinoscopy reveals septum deviates right, prominent caudal right septal vessel which is residual and persists, left > right inferior turbinate hypertrophy.  Nasal endoscopy was indicated to better evaluate the nose and paranasal sinuses, given the patient's history, persistent symptoms and exam findings, and is detailed below. No lesions of oral cavity/oropharynx; palpable tongue base without noted mass No obviously palpable neck masses/lymphadenopathy/thyromegaly No respiratory distress or stridor; voice  quality class 2;  PROCEDURE:  Prior to initiating any procedures, risks/benefits/alternatives were explained to the patient and verbal consent obtained. PROCEDURE: Bilateral Rigid Nasal Endoscopy with endoscopic control of Right sided epistaxis (CPT [phone]) Pre-procedure diagnosis: Right Epistaxis Post-procedure diagnosis: same Indication: See pre-procedure diagnosis and physical exam above Complications: None apparent EBL: minimal mL Anesthesia: Lidocaine  4% and topical decongestant was topically sprayed in each nasal cavity  Description of Procedure:  Patient was identified as correct patient. Verbal consent obtained. Afrin/lidocaine  mix was sprayed into the nose in both nasal cavities. Subsequently, a headlight and speculum were used and prominent vessels were noted over right septum but I was unable to determine the posterior-most extent of vessels using the headlight/speculum. Therefore, nasal endoscopy was necessary to evaluate and control the epistaxis.  A rigid 30 degree endoscope was utilized to evaluate the sinonasal cavities, mucosa, sinus ostia and turbinates and septum bilaterally. On the left, there were no prominent vessels; MM and SE recess clear. On the contralateral side, there were prominent septal vessels on septum but was no bleeding. We discussed options including continued humidification, v/s cautery and R/B/A and patient/caregiver opted for cauterization. Given lack of complete visualization of the posterior portion of the vessel extent, decision was performed to perform repeat cauterization after consent. Using the endoscope, the areas of bleeding were visualized and then spot cauterized using silver nitrate cautery.  No bleeding was seen. Mupirocin  ointment was applied to both nares. Patient tolerated the procedure well.  ASSESSMENT:  73 y.o. with:  1. Epistaxis   2. Throat discomfort   3. Dysphonia   4. Laryngopharyngeal reflux (LPR)   5. Nasal septal deviation   6.  Nasal congestion     Multiple complaints including nasal and throat complaint  Right nasal soreness - Recurrent right nasal bleeding likely from a visible septal blood vessel, with dryness contributing. No prior cauterization or anticoagulant use except baby aspirin . - Persistent sx despite cautery with residual vessel which was cauterized - As such, discussed repeat cautery which patient wished to proceed with and was performed - Continue ayr gel multiple times per day  - Some bleeding expected as nose heals over next few weeks F/u after 8 weeks if sx persist  Dysphagia, LPR, Throat discomfort Likely multifactorial -- LPR, can be due to neck pain as well or MTD (discomfort part). - MBS reassuring - Reflux gourmet helping with discomfort and LPR -  We discussed options including referral to GI as OP swallow looks good but she reports she continues to wish to observe; return precautions discussed  Vocal cord atrophy with hoarseness - Hoarseness from vocal cord atrophy, and MTD - Again offer voice Rx but declined - Will observe for now given above; increase hydration   Thank you for allowing me the opportunity to care for your patient. Please do not hesitate to contact me should you have any other questions.  Sincerely, Eldora Blanch, MD Otolaryngologist (ENT), [facility] Phone: [phone] Fax: [phone]  MDM:  [phone]  01/29/2024, 2:56 PM

## 2024-02-27 ENCOUNTER — Encounter: Admitting: Family Medicine

## 2024-03-21 ENCOUNTER — Ambulatory Visit: Admitting: Nurse Practitioner

## 2024-03-21 ENCOUNTER — Encounter: Payer: Self-pay | Admitting: Nurse Practitioner

## 2024-03-21 VITALS — BP 124/75 | HR 89 | Temp 97.7°F | Ht 64.0 in | Wt 158.0 lb

## 2024-03-21 DIAGNOSIS — J208 Acute bronchitis due to other specified organisms: Secondary | ICD-10-CM

## 2024-03-21 DIAGNOSIS — R040 Epistaxis: Secondary | ICD-10-CM

## 2024-03-21 MED ORDER — PREDNISONE 20 MG PO TABS: 40.0000 mg | ORAL_TABLET | Freq: Every day | ORAL | 0 refills | Status: AC

## 2024-03-21 MED ORDER — MUPIROCIN 2 % EX OINT: 1.0000 | TOPICAL_OINTMENT | Freq: Two times a day (BID) | CUTANEOUS | 1 refills | Status: AC

## 2024-03-21 MED ORDER — BENZONATATE 100 MG PO CAPS: 100.0000 mg | ORAL_CAPSULE | Freq: Two times a day (BID) | ORAL | 0 refills | Status: AC | PRN

## 2024-03-21 NOTE — Progress Notes (Signed)
 "  Subjective:    Patient ID: Selena Barnes, female    DOB: [birth year], 74 y.o.   MRN: [id number]   Chief Complaint: Cough and Sore Throat   Cough This is a new problem. The current episode started in the past 7 days. The problem has been waxing and waning. The cough is Productive of sputum. Associated symptoms include ear congestion, nasal congestion, rhinorrhea and a sore throat. Pertinent negatives include no chills or fever. Nothing aggravates the symptoms. She has tried prescription cough suppressant for the symptoms. The treatment provided mild relief.   Has frequent bloody nose on right- has seen ENT and tey gave her surgical lube to use in her nose but it is not helping. Patient Active Problem List   Diagnosis Date Noted   Mild obstructive sleep apnea-hypopnea syndrome 11/19/2023   Morning headache, controlled 10/03/2023   Cramp in lower leg associated with rest 10/03/2023   Snoring 10/03/2023   Non-restorative sleep 10/03/2023   Chronic migraine without aura, with intractable migraine, so stated, with status migrainosus 09/04/2023   Cerebrovascular disease 09/04/2023   Displacement of cervical intervertebral disc without myelopathy 04/07/2019   Bilateral carpal tunnel syndrome 03/03/2019   Other cervical disc degeneration, unspecified cervical region 11/07/2017   Constipation due to outlet dysfunction    Osteopenia with high risk of fracture 02/05/2015   BMI 26.0-26.9,adult 05/21/2014   Migraines 12/20/2012   Insomnia 12/20/2012   Depression 12/20/2012   GERD (gastroesophageal reflux disease) 12/20/2012   Hyperlipidemia with target LDL less than 100 12/20/2012       Review of Systems  Constitutional:  Negative for chills and fever.  HENT:  Positive for congestion, rhinorrhea and sore throat.   Respiratory:  Positive for cough.        Objective:   Physical Exam Constitutional:      Appearance: Normal appearance.  HENT:     Right Ear: Tympanic membrane normal.      Left Ear: Tympanic membrane normal.     Nose: Congestion and rhinorrhea present.     Comments: Right nares is erythematous and raw appearing    Mouth/Throat:     Pharynx: No oropharyngeal exudate or posterior oropharyngeal erythema.  Cardiovascular:     Rate and Rhythm: Normal rate and regular rhythm.     Heart sounds: Normal heart sounds.  Pulmonary:     Effort: Pulmonary effort is normal.     Breath sounds: Normal breath sounds.     Comments: Deep dry cough Musculoskeletal:     Cervical back: Normal range of motion.  Skin:    General: Skin is warm.  Neurological:     General: No focal deficit present.     Mental Status: She is alert and oriented to person, place, and time.  Psychiatric:        Mood and Affect: Mood normal.        Behavior: Behavior normal.    BP 124/75   Pulse 89   Temp 97.7 F (36.5 C)   Ht 5' 4 (1.626 m)   Wt 158 lb (71.7 kg)   SpO2 94%   BMI 27.12 kg/m         Assessment & Plan:   Selena Barnes in today with chief complaint of Cough and Sore Throat   1. Epistaxis (Primary) Pea size in right nares BID - mupirocin  ointment (BACTROBAN ) 2 %; Apply 1 Application topically 2 (two) times daily.  Dispense: 22 g; Refill: 1  2. Acute viral  bronchitis 1. Take meds as prescribed 2. Use a cool mist humidifier especially during the winter months and when heat has been humid. 3. Use saline nose sprays frequently 4. Saline irrigations of the nose can be very helpful if done frequently.  * 4X daily for 1 week*  * Use of a nettie pot can be helpful with this. Follow directions with this* 5. Drink plenty of fluids 6. Keep thermostat turn down low 7.For any cough or congestion- tessalon  perles 8. For fever or aces or pains- take tylenol  or ibuprofen appropriate for age and weight.  * for fevers greater than 101 orally you may alternate ibuprofen and tylenol  every  3 hours.    - predniSONE  (DELTASONE ) 20 MG tablet; Take 2 tablets (40 mg total) by  mouth daily with breakfast for 5 days. 2 po daily for 5 days  Dispense: 10 tablet; Refill: 0 - benzonatate  (TESSALON ) 100 MG capsule; Take 1 capsule (100 mg total) by mouth 2 (two) times daily as needed for cough.  Dispense: 20 capsule; Refill: 0    The above assessment and management plan was discussed with the patient. The patient verbalized understanding of and has agreed to the management plan. Patient is aware to call the clinic if symptoms persist or worsen. Patient is aware when to return to the clinic for a follow-up visit. Patient educated on when it is appropriate to go to the emergency department.   Mary-Margaret Gladis, FNP   "

## 2024-03-21 NOTE — Patient Instructions (Signed)

## 2024-04-15 ENCOUNTER — Ambulatory Visit: Admitting: Adult Health

## 2024-06-06 ENCOUNTER — Encounter: Payer: Self-pay | Admitting: Family Medicine

## 2025-01-27 ENCOUNTER — Ambulatory Visit
